# Patient Record
Sex: Male | Born: 1950 | Race: White | Hispanic: No | Smoking: Never smoker
Health system: Southern US, Community
[De-identification: ages and names within clinical notes are randomized; demographics above are authoritative.]

## PROBLEM LIST (undated history)

## (undated) DIAGNOSIS — G14 Postpolio syndrome: Secondary | ICD-10-CM

## (undated) DIAGNOSIS — I739 Peripheral vascular disease, unspecified: Secondary | ICD-10-CM

## (undated) DIAGNOSIS — E119 Type 2 diabetes mellitus without complications: Secondary | ICD-10-CM

## (undated) DIAGNOSIS — Z87442 Personal history of urinary calculi: Secondary | ICD-10-CM

## (undated) DIAGNOSIS — J40 Bronchitis, not specified as acute or chronic: Secondary | ICD-10-CM

## (undated) DIAGNOSIS — H35039 Hypertensive retinopathy, unspecified eye: Secondary | ICD-10-CM

## (undated) DIAGNOSIS — Z9889 Other specified postprocedural states: Secondary | ICD-10-CM

## (undated) DIAGNOSIS — S060XAA Concussion with loss of consciousness status unknown, initial encounter: Secondary | ICD-10-CM

## (undated) DIAGNOSIS — I10 Essential (primary) hypertension: Secondary | ICD-10-CM

## (undated) DIAGNOSIS — K219 Gastro-esophageal reflux disease without esophagitis: Secondary | ICD-10-CM

## (undated) DIAGNOSIS — M1990 Unspecified osteoarthritis, unspecified site: Secondary | ICD-10-CM

## (undated) DIAGNOSIS — Q780 Osteogenesis imperfecta: Secondary | ICD-10-CM

## (undated) DIAGNOSIS — S060X9A Concussion with loss of consciousness of unspecified duration, initial encounter: Secondary | ICD-10-CM

## (undated) DIAGNOSIS — H269 Unspecified cataract: Secondary | ICD-10-CM

## (undated) DIAGNOSIS — R112 Nausea with vomiting, unspecified: Secondary | ICD-10-CM

## (undated) HISTORY — PX: ANKLE FRACTURE SURGERY: SHX122

## (undated) HISTORY — DX: Peripheral vascular disease, unspecified: I73.9

## (undated) HISTORY — PX: LEG SURGERY: SHX1003

## (undated) HISTORY — DX: Unspecified cataract: H26.9

## (undated) HISTORY — PX: OTHER SURGICAL HISTORY: SHX169

## (undated) HISTORY — DX: Hypertensive retinopathy, unspecified eye: H35.039

## (undated) HISTORY — PX: TONSILLECTOMY: SUR1361

## (undated) HISTORY — PX: ELBOW FRACTURE SURGERY: SHX616

## (undated) HISTORY — PX: COLONOSCOPY: SHX174

## (undated) HISTORY — PX: EYE MUSCLE SURGERY: SHX370

---

## 1999-12-16 ENCOUNTER — Emergency Department (HOSPITAL_COMMUNITY): Admission: EM | Admit: 1999-12-16 | Discharge: 1999-12-16 | Payer: Self-pay | Admitting: Emergency Medicine

## 1999-12-16 ENCOUNTER — Encounter: Payer: Self-pay | Admitting: Emergency Medicine

## 2003-09-13 ENCOUNTER — Encounter: Payer: Self-pay | Admitting: Emergency Medicine

## 2003-09-13 ENCOUNTER — Emergency Department (HOSPITAL_COMMUNITY): Admission: EM | Admit: 2003-09-13 | Discharge: 2003-09-14 | Payer: Self-pay | Admitting: Emergency Medicine

## 2003-09-14 ENCOUNTER — Encounter: Payer: Self-pay | Admitting: Emergency Medicine

## 2005-10-19 ENCOUNTER — Inpatient Hospital Stay (HOSPITAL_COMMUNITY): Admission: EM | Admit: 2005-10-19 | Discharge: 2005-10-29 | Payer: Self-pay | Admitting: Emergency Medicine

## 2005-10-20 ENCOUNTER — Ambulatory Visit: Payer: Self-pay | Admitting: Physical Medicine & Rehabilitation

## 2005-10-23 ENCOUNTER — Encounter: Payer: Self-pay | Admitting: Orthopedic Surgery

## 2005-10-29 ENCOUNTER — Inpatient Hospital Stay
Admission: RE | Admit: 2005-10-29 | Discharge: 2005-11-09 | Payer: Self-pay | Admitting: Physical Medicine & Rehabilitation

## 2005-10-29 ENCOUNTER — Encounter (HOSPITAL_COMMUNITY)
Admission: RE | Admit: 2005-10-29 | Discharge: 2005-11-09 | Payer: Self-pay | Admitting: Physical Medicine & Rehabilitation

## 2005-12-14 ENCOUNTER — Ambulatory Visit (HOSPITAL_COMMUNITY): Admission: RE | Admit: 2005-12-14 | Discharge: 2005-12-14 | Payer: Self-pay | Admitting: Orthopedic Surgery

## 2005-12-14 ENCOUNTER — Ambulatory Visit (HOSPITAL_BASED_OUTPATIENT_CLINIC_OR_DEPARTMENT_OTHER): Admission: RE | Admit: 2005-12-14 | Discharge: 2005-12-14 | Payer: Self-pay | Admitting: Orthopedic Surgery

## 2012-09-03 ENCOUNTER — Ambulatory Visit (INDEPENDENT_AMBULATORY_CARE_PROVIDER_SITE_OTHER): Payer: Medicare Other

## 2012-09-03 DIAGNOSIS — Z23 Encounter for immunization: Secondary | ICD-10-CM

## 2016-01-29 ENCOUNTER — Emergency Department (HOSPITAL_COMMUNITY): Payer: Medicare Other

## 2016-01-29 ENCOUNTER — Encounter (HOSPITAL_COMMUNITY): Payer: Self-pay | Admitting: Emergency Medicine

## 2016-01-29 ENCOUNTER — Emergency Department (HOSPITAL_COMMUNITY)
Admission: EM | Admit: 2016-01-29 | Discharge: 2016-01-29 | Disposition: A | Discharge status: Home / Self Care | Payer: Medicare Other | Attending: Emergency Medicine | Admitting: Emergency Medicine

## 2016-01-29 DIAGNOSIS — Z8776 Personal history of (corrected) congenital malformations of integument, limbs and musculoskeletal system: Secondary | ICD-10-CM | POA: Insufficient documentation

## 2016-01-29 DIAGNOSIS — M545 Low back pain: Secondary | ICD-10-CM | POA: Diagnosis present

## 2016-01-29 DIAGNOSIS — I10 Essential (primary) hypertension: Secondary | ICD-10-CM | POA: Diagnosis not present

## 2016-01-29 DIAGNOSIS — Z8709 Personal history of other diseases of the respiratory system: Secondary | ICD-10-CM | POA: Diagnosis not present

## 2016-01-29 DIAGNOSIS — E119 Type 2 diabetes mellitus without complications: Secondary | ICD-10-CM | POA: Diagnosis not present

## 2016-01-29 DIAGNOSIS — M5431 Sciatica, right side: Secondary | ICD-10-CM | POA: Insufficient documentation

## 2016-01-29 DIAGNOSIS — G8929 Other chronic pain: Secondary | ICD-10-CM | POA: Insufficient documentation

## 2016-01-29 HISTORY — DX: Osteogenesis imperfecta: Q78.0

## 2016-01-29 HISTORY — DX: Type 2 diabetes mellitus without complications: E11.9

## 2016-01-29 HISTORY — DX: Postpolio syndrome: G14

## 2016-01-29 HISTORY — DX: Bronchitis, not specified as acute or chronic: J40

## 2016-01-29 HISTORY — DX: Essential (primary) hypertension: I10

## 2016-01-29 LAB — URINE MICROSCOPIC-ADD ON: RBC / HPF: NONE SEEN RBC/hpf (ref 0–5)

## 2016-01-29 LAB — URINALYSIS, ROUTINE W REFLEX MICROSCOPIC
BILIRUBIN URINE: NEGATIVE
Glucose, UA: >1000 mg/dL — AB
HGB URINE DIPSTICK: NEGATIVE
Leukocytes, UA: NEGATIVE
Nitrite: NEGATIVE
PH: 5.5 (ref 5.0–8.0)
Protein, ur: NEGATIVE mg/dL
Specific Gravity, Urine: 1.015 (ref 1.005–1.030)

## 2016-01-29 MED ORDER — HYDROCODONE-ACETAMINOPHEN 5-325 MG PO TABS
1.0000 | ORAL_TABLET | Freq: Once | ORAL | Status: AC
  Administered 2016-01-29: 1 via ORAL
  Filled 2016-01-29: qty 1

## 2016-01-29 MED ORDER — METHOCARBAMOL 500 MG PO TABS: 500.0000 mg | ORAL_TABLET | Freq: Three times a day (TID) | ORAL | Status: DC

## 2016-01-29 MED ORDER — METHOCARBAMOL 500 MG PO TABS
500.0000 mg | ORAL_TABLET | Freq: Once | ORAL | Status: AC
  Administered 2016-01-29: 500 mg via ORAL
  Filled 2016-01-29: qty 1

## 2016-01-29 MED ORDER — HYDROCODONE-ACETAMINOPHEN 7.5-325 MG PO TABS: 1.0000 | ORAL_TABLET | Freq: Four times a day (QID) | ORAL | Status: DC | PRN

## 2016-01-29 NOTE — ED Provider Notes (Signed)
CSN: [id number]     Arrival date & time 01/29/16  1234 History  By signing my name below, I, Ronney Lion, attest that this documentation has been prepared under the direction and in the presence of Tamme Mozingo, PA-C. Electronically Signed: Ronney Lion, ED Scribe. 01/29/2016. 3:42 PM.    Chief Complaint  Patient presents with  . Back Pain   Patient is a 65 y.o. male presenting with back pain. The history is provided by the patient. No language interpreter was used.  Back Pain Location:  Sacro-iliac joint Quality:  Stabbing Radiates to:  R posterior upper leg and R thigh Pain severity:  Severe Duration:  10 days Timing:  Constant Chronicity:  Chronic Context: not falling, not occupational injury, not pedestrian accident and not recent injury   Relieved by:  None tried Worsened by:  Nothing tried Ineffective treatments: Tylenol. Associated symptoms: no abdominal pain, no bladder incontinence, no bowel incontinence, no chest pain, no dysuria, no fever, no numbness, no tingling and no weakness   Risk factors comment:  History of osteogenesis imperfecta   HPI Comments: BROGAN MARTIS is a 65 y.o. male with a history of post-polio syndrome, osteogenesis imperfecta, HTN, DM, and bronchitis, who presents to the Emergency Department complaining of an acute exacerbation of right lower back pain radiating into his right hip, with onset 10 days ago. Patient reports he has this same pain intermittently all the time, but this usually subsides in a day or two. However, patient had taken Tylenol, which normally alleviates his pain, this morning at 3 AM with no relief. He denies any known falls or injuries, or any other possible known triggers that may have caused any fractures. Patient denies any chest pain, dysuria, bowel or bladder incontinence, or lower extremity numbness weakness or tingling. Uses a cane at baseline  Past Medical History  Diagnosis Date  . Hypertension   . Diabetes mellitus  without complication (HCC)   . Post-polio syndrome   . Osteogenesis imperfecta   . Bronchitis    Past Surgical History  Procedure Laterality Date  . Arm surgery    . Ankle fracture surgery Bilateral   . Eye muscle surgery    . Leg surgery Left    Family History  Problem Relation Age of Onset  . Hypertension Mother   . Diabetes Brother    Social History  Substance Use Topics  . Smoking status: Never Smoker   . Smokeless tobacco: Never Used  . Alcohol Use: No    Review of Systems  Constitutional: Negative for fever.  Respiratory: Negative for shortness of breath.   Cardiovascular: Negative for chest pain.  Gastrointestinal: Negative for vomiting, abdominal pain, constipation and bowel incontinence.  Genitourinary: Negative for bladder incontinence, dysuria, hematuria, flank pain, decreased urine volume and difficulty urinating.       Negative for incontinence  Musculoskeletal: Positive for back pain. Negative for joint swelling.  Skin: Negative for rash.  Neurological: Negative for tingling, weakness and numbness.  All other systems reviewed and are negative.     Allergies  Augmentin; Iodine; and Tape  Home Medications   Prior to Admission medications   Not on File   BP 129/77 mmHg  Pulse 89  Temp(Src) 98.1 F (36.7 C) (Oral)  Resp 20  SpO2 98% Physical Exam  Constitutional: He is oriented to person, place, and time. He appears well-developed and well-nourished. No distress.  HENT:  Head: Normocephalic and atraumatic.  Eyes: Conjunctivae and EOM are normal.  Blue  tinge to sclera.  Neck: Neck supple. No tracheal deviation present.  Cardiovascular: Normal rate, regular rhythm and normal heart sounds.  Exam reveals no gallop and no friction rub.   No murmur heard. Pulmonary/Chest: Effort normal. No respiratory distress.  Musculoskeletal: He exhibits tenderness. He exhibits no edema.  Tenderness at right SI joint. No spinal tenderness.  Neg SLR bilaterally.   Sensation intact  Neurological: He is alert and oriented to person, place, and time.  Skin: Skin is warm and dry.  Psychiatric: He has a normal mood and affect. His behavior is normal.  Nursing note and vitals reviewed.   ED Course  Procedures (including critical care time)  DIAGNOSTIC STUDIES: Oxygen Saturation is 98% on RA, normal by my interpretation.    COORDINATION OF CARE: 2:20 PM - Discussed treatment plan with pt at bedside which includes sacral XR and dosage of muscle relaxant administered here. Pt verbalized understanding and agreed to plan.   Labs Review Labs Reviewed  URINALYSIS, ROUTINE W REFLEX MICROSCOPIC (NOT AT [facility]) - Abnormal; Notable for the following:    Glucose, UA >1000 (*)    Ketones, ur TRACE (*)    All other components within normal limits  URINE MICROSCOPIC-ADD ON - Abnormal; Notable for the following:    Squamous Epithelial / LPF 0-5 (*)    Bacteria, UA RARE (*)    All other components within normal limits    Imaging Review Dg Lumbar Spine Complete  01/29/2016  CLINICAL DATA:  65 year old presenting with a 10 day history of low back pain radiating into the right hip associated with muscle spasm. Prior thoracolumbar compression fractures. Current history of post-polio syndrome and osteogenesis imperfecta. EXAM: LUMBAR SPINE - COMPLETE 4+ VIEW COMPARISON:  MRI lumbar spine 05/07/2008. FINDINGS: Five non rib-bearing lumbar vertebrae with anatomic posterior alignment. Old compression fractures of T12 and L1 as noted on the prior MRI. No evidence of acute fracture. 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 DISH throughout the visualized lower thoracic spine. Disc space narrowing at every lumbar level. No pars defects. Facet degenerative changes at L4-5 and L5-S1. Sacroiliac joints intact with degenerative changes. Symphysis pubis intact. Intramedullary nail in the visualized left femur.  Moderate symmetric axial joint space narrowing in the hips. IMPRESSION: 1. No acute osseous abnormality. 2. Multilevel degenerative disc disease, spondylosis and facet degenerative changes. 3. DISH. Electronically Signed   By: Hulan Saas M.D.   On: 01/29/2016 15:30   Dg Hip Unilat With Pelvis 2-3 Views Right  01/29/2016  CLINICAL DATA:  Muscle spasms in the back and right hip for 10 days. Pain. EXAM: DG HIP (WITH OR WITHOUT PELVIS) 2-3V RIGHT COMPARISON:  None. FINDINGS: The bones appear mildly demineralized. There is a left femoral intramedullary nail with associated posttraumatic deformity of the proximal left femoral diaphysis. There is heterotopic ossification superior to the left greater trochanter and adjacent to the left iliac bone. The proximal right femur appears normal. There is no evidence of acute fracture, dislocation or femoral head avascular necrosis. Mild degenerative changes are present at the sacroiliac joints. IMPRESSION: Negative right hip radiographs. Previous proximal left femoral intramedullary nail placement. Electronically Signed   By: Carey Bullocks M.D.   On: 01/29/2016 15:26   I have personally reviewed and evaluated these images and lab results as part of my medical decision-making.   EKG Interpretation None      MDM   Final diagnoses:  Sciatica of  right side   Pt is well appearing.  Ambulates with a cane at baseline.  No focal neuro deficits.  Hx of osteogenesis imperfecta, XRs neg for fx.  No concerning sx's for emergent neurological process.   I personally performed the services described in this documentation, which was scribed in my presence. The recorded information has been reviewed and is accurate.     Pauline Aus, PA-C 01/30/16 2132  Margarita Grizzle, MD 01/31/16 [phone]

## 2016-01-29 NOTE — Discharge Instructions (Signed)
°  Sciatica °Sciatica is pain, weakness, numbness, or tingling along your sciatic nerve. The nerve starts in the lower back and runs down the back of each leg. Nerve damage or certain conditions pinch or put pressure on the sciatic nerve. This causes the pain, weakness, and other discomforts of sciatica. °HOME CARE  °· Only take medicine as told by your doctor. °· Apply ice to the affected area for 20 minutes. Do this 3-4 times a day for the first 48-72 hours. Then try heat in the same way. °· Exercise, stretch, or do your usual activities if these do not make your pain worse. °· Go to physical therapy as told by your doctor. °· Keep all doctor visits as told. °· Do not wear high heels or shoes that are not supportive. °· Get a firm mattress if your mattress is too soft to lessen pain and discomfort. °GET HELP RIGHT AWAY IF:  °· You cannot control when you poop (bowel movement) or pee (urinate). °· You have more weakness in your lower back, lower belly (pelvis), butt (buttocks), or legs. °· You have redness or puffiness (swelling) of your back. °· You have a burning feeling when you pee. °· You have pain that gets worse when you lie down. °· You have pain that wakes you from your sleep. °· Your pain is worse than past pain. °· Your pain lasts longer than 4 weeks. °· You are suddenly losing weight without reason. °MAKE SURE YOU:  °· Understand these instructions. °· Will watch this condition. °· Will get help right away if you are not doing well or get worse. °  °This information is not intended to replace advice given to you by your health care provider. Make sure you discuss any questions you have with your health care provider. °  °Document Released: 09/11/2008 Document Revised: 08/24/2015 Document Reviewed: 04/13/2012 °Elsevier Interactive Patient Education ©2016 [employer] ° ° °

## 2016-01-29 NOTE — ED Notes (Signed)
Patient c/o right lower back pain that radiates into right hip x10 days. Per patient has pain intermittently but usually subsides within a day or two. Patient reports taking tylenol this morning at 3 with no relief.

## 2018-11-27 ENCOUNTER — Other Ambulatory Visit (INDEPENDENT_AMBULATORY_CARE_PROVIDER_SITE_OTHER): Payer: Self-pay | Admitting: Nurse Practitioner

## 2018-12-25 ENCOUNTER — Ambulatory Visit (INDEPENDENT_AMBULATORY_CARE_PROVIDER_SITE_OTHER): Payer: Medicare Other

## 2018-12-25 ENCOUNTER — Encounter: Payer: Self-pay | Admitting: Orthopaedic Surgery

## 2018-12-25 ENCOUNTER — Ambulatory Visit: Payer: Medicare Other | Admitting: Orthopaedic Surgery

## 2018-12-25 VITALS — BP 149/83 | HR 89 | Ht 65.0 in | Wt 206.0 lb

## 2018-12-25 DIAGNOSIS — E119 Type 2 diabetes mellitus without complications: Secondary | ICD-10-CM

## 2018-12-25 DIAGNOSIS — M25511 Pain in right shoulder: Secondary | ICD-10-CM | POA: Diagnosis not present

## 2018-12-25 DIAGNOSIS — G14 Postpolio syndrome: Secondary | ICD-10-CM | POA: Diagnosis not present

## 2018-12-25 DIAGNOSIS — Q780 Osteogenesis imperfecta: Secondary | ICD-10-CM | POA: Diagnosis not present

## 2018-12-25 DIAGNOSIS — E1165 Type 2 diabetes mellitus with hyperglycemia: Secondary | ICD-10-CM | POA: Insufficient documentation

## 2018-12-25 HISTORY — DX: Type 2 diabetes mellitus without complications: E11.9

## 2018-12-25 NOTE — Progress Notes (Signed)
Subjective:    Patient ID: Kurt [name], male    DOB: 1951-01-14, 68 y.o.   MRN: [id number]  HPI He has had right shoulder pain for years.  He contacted polio at age 68 and it affected his right upper extremity and right lower leg.  He has had tendon surgery on the right for his hand but still has some deformity and laxity of the fingers.  He hurt his right shoulder years ago and had a fracture that was not repaired.  He has chronic pain in the right shoulder.  He is on pain medicine.  He has no new trauma.  He has some limitation of motion of the right shoulder.  He is tired of the shoulder hurting.  He asks about possible injection or other procedure.  He has no redness, no paresthesias and no effusion of the shoulder.  He uses a cane normally.  He also has diabetes, COPD, and osteogenesis imperfecta.   Review of Systems  Constitutional: Positive for activity change.  Respiratory: Positive for shortness of breath. Negative for cough.   Musculoskeletal: Positive for arthralgias, gait problem and joint swelling.  All other systems reviewed and are negative.  For Review of Systems, all other systems reviewed and are negative.  The following is a summary of the past history medically, past history surgically, known current medicines, social history and family history.  This information is gathered electronically by the computer from prior information and documentation.  I review this each visit and have found including this information at this point in the chart is beneficial and informative.   Past Medical History:  Diagnosis Date  . Bronchitis   . Diabetes mellitus without complication (HCC)   . Hypertension   . Osteogenesis imperfecta   . Post-polio syndrome     Past Surgical History:  Procedure Laterality Date  . ANKLE FRACTURE SURGERY Bilateral   . arm surgery    . EYE MUSCLE SURGERY    . LEG SURGERY Left     Current Outpatient Medications on File Prior to Visit    Medication Sig Dispense Refill  . acetaminophen (TYLENOL) 500 MG tablet Take 1,000 mg by mouth every 4 (four) hours as needed for mild pain or moderate pain.    Marland Kitchen. glipiZIDE (GLUCOTROL XL) 10 MG 24 hr tablet Take 10 mg by mouth daily with breakfast.    . HYDROcodone-acetaminophen (NORCO) 7.5-325 MG tablet Take 1 tablet by mouth every 6 (six) hours as needed for moderate pain. 20 tablet 0  . lisinopril (PRINIVIL,ZESTRIL) 20 MG tablet Take 20 mg by mouth daily.    . metFORMIN (GLUCOPHAGE) 1000 MG tablet Take 1,000 mg by mouth 2 (two) times daily with a meal.    . methocarbamol (ROBAXIN) 500 MG tablet Take 1 tablet (500 mg total) by mouth 3 (three) times daily. 21 tablet 0  . simvastatin (ZOCOR) 20 MG tablet Take 20 mg by mouth every evening.     No current facility-administered medications on file prior to visit.     Social History   Socioeconomic History  . Marital status: Married    Spouse name: Not on file  . Number of children: Not on file  . Years of education: Not on file  . Highest education level: Not on file  Occupational History  . Not on file  Social Needs  . Financial resource strain: Not on file  . Food insecurity:    Worry: Not on file    Inability: Not  on file  . Transportation needs:    Medical: Not on file    Non-medical: Not on file  Tobacco Use  . Smoking status: Never Smoker  . Smokeless tobacco: Never Used  Substance and Sexual Activity  . Alcohol use: No  . Drug use: No  . Sexual activity: Not on file  Lifestyle  . Physical activity:    Days per week: Not on file    Minutes per session: Not on file  . Stress: Not on file  Relationships  . Social connections:    Talks on phone: Not on file    Gets together: Not on file    Attends religious service: Not on file    Active member of club or organization: Not on file    Attends meetings of clubs or organizations: Not on file    Relationship status: Not on file  . Intimate partner violence:    Fear of  current or ex partner: Not on file    Emotionally abused: Not on file    Physically abused: Not on file    Forced sexual activity: Not on file  Other Topics Concern  . Not on file  Social History Narrative  . Not on file    Family History  Problem Relation Age of Onset  . Hypertension Mother   . Diabetes Brother     BP (!) 149/83   Pulse 89   Ht 5\' 5"  (1.651 m)   Wt 206 lb (93.4 kg)   BMI 34.28 kg/m   Body mass index is 34.28 kg/m.      Objective:   Physical Exam Constitutional:      Appearance: He is well-developed.  HENT:     Head: Normocephalic and atraumatic.  Eyes:     Conjunctiva/sclera: Conjunctivae normal.     Pupils: Pupils are equal, round, and reactive to light.  Neck:     Musculoskeletal: Normal range of motion and neck supple.  Cardiovascular:     Rate and Rhythm: Normal rate and regular rhythm.  Pulmonary:     Effort: Pulmonary effort is normal.  Abdominal:     Palpations: Abdomen is soft.  Musculoskeletal:     Right shoulder: He exhibits decreased range of motion and tenderness.       Arms:  Skin:    General: Skin is warm and dry.  Neurological:     Mental Status: He is alert and oriented to person, place, and time.     Cranial Nerves: No cranial nerve deficit.     Motor: No abnormal muscle tone.     Coordination: Coordination normal.     Deep Tendon Reflexes: Reflexes are normal and symmetric. Reflexes normal.  Psychiatric:        Behavior: Behavior normal.        Thought Content: Thought content normal.        Judgment: Judgment normal.   He uses a cane to walk and has weakness on the right lower leg and limp to the right.  He has a waddleing type gait.   X-rays were done of the right shoulder, reported separately.  I have reviewed notes from his family doctor.    Assessment & Plan:   Encounter Diagnoses  Name Primary?  . Pain in joint of right shoulder Yes  . Post-polio syndrome   . Osteogenesis imperfecta   . Diabetes  mellitus without complication (HCC)    PROCEDURE NOTE:  The patient request injection, verbal consent was obtained.  The right shoulder was prepped appropriately after time out was performed.   Sterile technique was observed and injection of 1 cc of Depo-Medrol 40 mg with several cc's of plain xylocaine. Anesthesia was provided by ethyl chloride and a 20-gauge needle was used to inject the shoulder area. A posterior approach was used.  The injection was tolerated well.  A band aid dressing was applied.  The patient was advised to apply ice later today and tomorrow to the injection sight as needed.  I have told him he has degenerative changes of the right shoulder.  I would not recommend any surgery at this point and time.  I will see him in one month.  Call if any problem.  Precautions discussed.   Electronically Signed Darreld McleanWayne Latressa Harries, MD 1/9/20208:50 AM

## 2019-01-22 ENCOUNTER — Ambulatory Visit: Payer: Medicare Other | Admitting: Orthopaedic Surgery

## 2019-02-04 ENCOUNTER — Ambulatory Visit: Payer: Medicare Other | Admitting: Orthopaedic Surgery

## 2019-02-04 ENCOUNTER — Encounter: Payer: Self-pay | Admitting: Orthopaedic Surgery

## 2019-02-04 VITALS — BP 131/76 | HR 91 | Ht 65.0 in | Wt 210.0 lb

## 2019-02-04 DIAGNOSIS — M25511 Pain in right shoulder: Secondary | ICD-10-CM

## 2019-02-04 NOTE — Progress Notes (Signed)
CC:  My shoulder is much better  He is doing well with the right shoulder post injection last time.  He has painless motion.  He still lacks full overhead flexion by about 20 degrees.  NV intact.  Encounter Diagnosis  Name Primary?  . Pain in joint of right shoulder Yes   I will see as needed.  Call if any problem.  Precautions discussed.   Electronically Signed Darreld Mclean, MD 2/19/20202:03 PM

## 2019-02-24 ENCOUNTER — Other Ambulatory Visit: Payer: Self-pay

## 2019-02-24 ENCOUNTER — Encounter (HOSPITAL_COMMUNITY): Payer: Self-pay | Admitting: *Deleted

## 2019-02-24 ENCOUNTER — Inpatient Hospital Stay (HOSPITAL_COMMUNITY)
Admission: EM | Admit: 2019-02-24 | Discharge: 2019-02-27 | DRG: 603 | Disposition: A | Discharge status: Home Health | Payer: Medicare Other | Source: Ambulatory Visit | Attending: Internal Medicine | Admitting: Internal Medicine

## 2019-02-24 DIAGNOSIS — Z881 Allergy status to other antibiotic agents status: Secondary | ICD-10-CM

## 2019-02-24 DIAGNOSIS — Z833 Family history of diabetes mellitus: Secondary | ICD-10-CM

## 2019-02-24 DIAGNOSIS — I10 Essential (primary) hypertension: Secondary | ICD-10-CM | POA: Diagnosis present

## 2019-02-24 DIAGNOSIS — Q780 Osteogenesis imperfecta: Secondary | ICD-10-CM

## 2019-02-24 DIAGNOSIS — G14 Postpolio syndrome: Secondary | ICD-10-CM | POA: Diagnosis present

## 2019-02-24 DIAGNOSIS — E1165 Type 2 diabetes mellitus with hyperglycemia: Secondary | ICD-10-CM | POA: Diagnosis present

## 2019-02-24 DIAGNOSIS — Z8249 Family history of ischemic heart disease and other diseases of the circulatory system: Secondary | ICD-10-CM | POA: Diagnosis not present

## 2019-02-24 DIAGNOSIS — L03116 Cellulitis of left lower limb: Secondary | ICD-10-CM | POA: Diagnosis present

## 2019-02-24 DIAGNOSIS — Z888 Allergy status to other drugs, medicaments and biological substances status: Secondary | ICD-10-CM | POA: Diagnosis not present

## 2019-02-24 DIAGNOSIS — L0390 Cellulitis, unspecified: Secondary | ICD-10-CM | POA: Diagnosis present

## 2019-02-24 LAB — CBC WITH DIFFERENTIAL/PLATELET
ABS IMMATURE GRANULOCYTES: 0.06 10*3/uL (ref 0.00–0.07)
BASOS ABS: 0.1 10*3/uL (ref 0.0–0.1)
Basophils Relative: 1 %
EOS PCT: 1 %
Eosinophils Absolute: 0.1 10*3/uL (ref 0.0–0.5)
HCT: 42.6 % (ref 39.0–52.0)
HEMOGLOBIN: 13.5 g/dL (ref 13.0–17.0)
IMMATURE GRANULOCYTES: 1 %
LYMPHS ABS: 2.3 10*3/uL (ref 0.7–4.0)
LYMPHS PCT: 22 %
MCH: 28.8 pg (ref 26.0–34.0)
MCHC: 31.7 g/dL (ref 30.0–36.0)
MCV: 91 fL (ref 80.0–100.0)
Monocytes Absolute: 0.9 10*3/uL (ref 0.1–1.0)
Monocytes Relative: 8 %
NEUTROS ABS: 7.2 10*3/uL (ref 1.7–7.7)
NEUTROS PCT: 67 %
NRBC: 0 % (ref 0.0–0.2)
Platelets: 312 10*3/uL (ref 150–400)
RBC: 4.68 MIL/uL (ref 4.22–5.81)
RDW: 14.4 % (ref 11.5–15.5)
WBC: 10.6 10*3/uL — ABNORMAL HIGH (ref 4.0–10.5)

## 2019-02-24 LAB — COMPREHENSIVE METABOLIC PANEL
ALT: 22 U/L (ref 0–44)
ANION GAP: 8 (ref 5–15)
AST: 19 U/L (ref 15–41)
Albumin: 3.7 g/dL (ref 3.5–5.0)
Alkaline Phosphatase: 87 U/L (ref 38–126)
BUN: 14 mg/dL (ref 8–23)
CHLORIDE: 100 mmol/L (ref 98–111)
CO2: 25 mmol/L (ref 22–32)
Calcium: 9 mg/dL (ref 8.9–10.3)
Creatinine, Ser: 1.03 mg/dL (ref 0.61–1.24)
Glucose, Bld: 341 mg/dL — ABNORMAL HIGH (ref 70–99)
POTASSIUM: 4.9 mmol/L (ref 3.5–5.1)
SODIUM: 133 mmol/L — AB (ref 135–145)
Total Bilirubin: 0.7 mg/dL (ref 0.3–1.2)
Total Protein: 6.7 g/dL (ref 6.5–8.1)

## 2019-02-24 LAB — LACTIC ACID, PLASMA: LACTIC ACID, VENOUS: 2.1 mmol/L — AB (ref 0.5–1.9)

## 2019-02-24 LAB — CBG MONITORING, ED: GLUCOSE-CAPILLARY: 335 mg/dL — AB (ref 70–99)

## 2019-02-24 MED ORDER — VANCOMYCIN HCL IN DEXTROSE 1-5 GM/200ML-% IV SOLN
1000.0000 mg | INTRAVENOUS | Status: AC
  Administered 2019-02-24 (×2): 1000 mg via INTRAVENOUS
  Filled 2019-02-24 (×2): qty 200

## 2019-02-24 MED ORDER — VANCOMYCIN HCL IN DEXTROSE 1-5 GM/200ML-% IV SOLN
1000.0000 mg | Freq: Two times a day (BID) | INTRAVENOUS | Status: DC
  Administered 2019-02-25 – 2019-02-27 (×5): 1000 mg via INTRAVENOUS
  Filled 2019-02-24 (×5): qty 200

## 2019-02-24 MED ORDER — VANCOMYCIN HCL IN DEXTROSE 1-5 GM/200ML-% IV SOLN: 1000.0000 mg | Freq: Once | INTRAVENOUS | Status: DC

## 2019-02-24 MED ORDER — SODIUM CHLORIDE 0.9 % IV SOLN
2.0000 g | Freq: Once | INTRAVENOUS | Status: AC
  Administered 2019-02-24: 2 g via INTRAVENOUS
  Filled 2019-02-24: qty 20

## 2019-02-24 NOTE — ED Provider Notes (Signed)
Emergency Department Provider Note   I have reviewed the triage vital signs and the nursing notes.   HISTORY  Chief Complaint Leg Injury (left)   HPI Kurt [name] is a 68 y.o. male with PMH of DM, HTN, and osteogenesis imperfecta presents to the emergency department for evaluation of left leg injury with concern for infection.  Patient coming from his PCPs office at [facility] medical.  4 days ago the patient fell with his left leg through a floor air vent.  His entire leg went into the duct up to the upper thigh.  He sustained lacerations and abrasions to the left lower leg primarily with bruising in the upper thigh.  He was started on clindamycin and has taken 3.5 days of medication.  He notices worsening redness and drainage from the wounds.  He saw his PCP today who evaluated him and sent him to the emergency department for evaluation of possible infection.  He has not noticed any fever at home.  No shaking chills.  No radiation of symptoms or modifying factors.    Past Medical History:  Diagnosis Date  . Bronchitis   . Diabetes mellitus without complication (HCC)   . Hypertension   . Osteogenesis imperfecta   . Post-polio syndrome     Patient Active Problem List   Diagnosis Date Noted  . 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 Osteogenesis imperfecta 12/25/2018  . Diabetes mellitus without complication (HCC) 12/25/2018    Past Surgical History:  Procedure Laterality Date  . ANKLE FRACTURE SURGERY Bilateral   . arm surgery    . EYE MUSCLE SURGERY    . LEG SURGERY Left     Allergies Augmentin [amoxicillin-pot clavulanate]; Iodine; and Tape  Family History  Problem Relation Age of Onset  . Hypertension Mother   . Diabetes Brother     Social History Social History   Tobacco Use  . Smoking status: Never Smoker  . Smokeless tobacco: Never Used  Substance Use Topics  . Alcohol use: No  . Drug use: No    Review of Systems  Constitutional: No  fever/chills Eyes: No visual changes. ENT: No sore throat. Cardiovascular: Denies chest pain. Respiratory: Denies shortness of breath. Gastrointestinal: No abdominal pain.  No nausea, no vomiting.  No diarrhea.  No constipation. Genitourinary: Negative for dysuria. Musculoskeletal: Negative for back pain. Positive left leg pain.  Skin: Positive left leg wound with drainage.  Neurological: Negative for headaches, focal weakness or numbness.  10-point ROS otherwise negative.  ____________________________________________   PHYSICAL EXAM:  VITAL SIGNS: ED Triage Vitals  Enc Vitals Group     BP 02/24/19 1735 138/80     Pulse Rate 02/24/19 1735 (!) 105     Resp 02/24/19 1735 18     Temp 02/24/19 1735 98 F (36.7 C)     Temp Source 02/24/19 1735 Oral     SpO2 02/24/19 1735 100 %     Weight 02/24/19 1736 206 lb (93.4 kg)     Height 02/24/19 1736 5\' 5"  (1.651 m)     Pain Score 02/24/19 1736 4   Constitutional: Alert and oriented. Well appearing and in no acute distress. Eyes: Conjunctivae are normal.  Head: Atraumatic. Nose: No congestion/rhinnorhea. Mouth/Throat: Mucous membranes are moist.  Neck: No stridor.  Cardiovascular: Normal rate, regular rhythm. Good peripheral circulation. Grossly normal heart sounds.   Respiratory: Normal respiratory effort.  No retractions. Lungs CTAB. Gastrointestinal: Soft and nontender. No distention.  Musculoskeletal: No lower extremity tenderness nor edema.  No gross deformities of extremities. Neurologic:  Normal speech and language. No gross focal neurologic deficits are appreciated.  Skin:  Skin is warm and dry.  Multiple abrasions to the left lower leg.  The worst is the posterior left leg which has surrounding erythema and warmth.  Some drainage on the bandages which was applied just prior to arrival.  But does have some mild edema diffusely.  No crepitus.  ____________________________________________   LABS (all labs ordered are listed,  but only abnormal results are displayed)  Labs Reviewed  COMPREHENSIVE METABOLIC PANEL - Abnormal; Notable for the following components:      Result Value   Sodium 133 (*)    Glucose, Bld 341 (*)    All other components within normal limits  CBC WITH DIFFERENTIAL/PLATELET - Abnormal; Notable for the following components:   WBC 10.6 (*)    All other components within normal limits  LACTIC ACID, PLASMA - Abnormal; Notable for the following components:   Lactic Acid, Venous 2.1 (*)    All other components within normal limits  CBG MONITORING, ED - Abnormal; Notable for the following components:   Glucose-Capillary 335 (*)    All other components within normal limits  CULTURE, BLOOD (ROUTINE X 2)  CULTURE, BLOOD (ROUTINE X 2)   ____________________________________________  RADIOLOGY  None  ____________________________________________   PROCEDURES  Procedure(s) performed:   Procedures  None  ____________________________________________   INITIAL IMPRESSION / ASSESSMENT AND PLAN / ED COURSE  Pertinent labs & imaging results that were available during my care of the patient were reviewed by me and considered in my medical decision making (see chart for details).  Patient presents to the emergency department with wounds to the left lower extremity.  He has been on 3.5 days of clindamycin but has developed worsening redness and drainage from the wounds.  He has diabetes which complicates his presentation.  Afebrile here but does have some mild tachycardia.  No wounds extending deep or concern for osteomyelitis.  Patient with leukocytosis and mildly elevated lactate. Plan for abx with concern that cellulitis is developing. Patient with increased risk with IDDM and has been on 3.5 days of Clindamycin with worsening wound status.   Discussed patient's case with Hospitalist, Dr. Sharl Ma to request admission. Patient and family (if present) updated with plan. Care transferred to  Hospitalist service.  I reviewed all nursing notes, vitals, pertinent old records, EKGs, labs, imaging (as available).  ____________________________________________  FINAL CLINICAL IMPRESSION(S) / ED DIAGNOSES  Final diagnoses:  Cellulitis of left lower extremity     MEDICATIONS GIVEN DURING THIS VISIT:  Medications  cefTRIAXone (ROCEPHIN) 2 g in sodium chloride 0.9 % 100 mL IVPB (2 g Intravenous New Bag/Given 02/24/19 2123)  vancomycin (VANCOCIN) IVPB 1000 mg/200 mL premix (has no administration in time range)  vancomycin (VANCOCIN) IVPB 1000 mg/200 mL premix (has no administration in time range)    Note:  This document was prepared using Dragon voice recognition software and may include unintentional dictation errors.  Alona Bene, MD Emergency Medicine    Long, Arlyss Repress, MD 02/24/19 2142

## 2019-02-24 NOTE — H&P (Signed)
TRH H&P    Patient Demographics:    Kurt [name], is a 68 y.o. male  MRN: [id number]  DOB - 07/23/1951  Admit Date - 02/24/2019  Referring MD/NP/PA: Dr. Jacqulyn BathLong  Outpatient Primary MD for the patient is Altamease OilerHarris, Meredith L, FNP  Patient coming from: Home  Chief complaint-left leg wound   HPI:    Kurt [name]  is a 68 y.o. male, with history of diabetes mellitus type 2, hypertension, osteogenesis imperfecta, post polio syndrome came to ED for evaluation of left leg injury.  4 days ago patient fell with his left leg went through floor air vent.  His entire leg went into duct up to the upper thigh.  He sustained lacerations and abrasions to left lower leg with bruising in upper thigh.  Patient was seen at PCP office and was started on clindamycin.  Patient noticed that his left leg acutely worsened with more redness and pain.  So he came to ED for reevaluation. Patient was started on vancomycin and ceftriaxone. He denies fever or chills. Denies nausea vomiting or diarrhea. Denies chest pain or shortness of breath.     Review of systems:    In addition to the HPI above,    All other systems reviewed and are negative.    Past History of the following :    Past Medical History:  Diagnosis Date  . Bronchitis   . Diabetes mellitus without complication (HCC)   . Hypertension   . Osteogenesis imperfecta   . Post-polio syndrome       Past Surgical History:  Procedure Laterality Date  . ANKLE FRACTURE SURGERY Bilateral   . arm surgery    . EYE MUSCLE SURGERY    . LEG SURGERY Left       Social History:      Social History   Tobacco Use  . Smoking status: Never Smoker  . Smokeless tobacco: Never Used  Substance Use Topics  . Alcohol use: No       Family History :     Family History  Problem Relation Age of Onset  . Hypertension Mother   . Diabetes Brother       Home  Medications:   Prior to Admission medications   Medication Sig Start Date End Date Taking? Authorizing Provider  acetaminophen (TYLENOL) 500 MG tablet Take 1,000 mg by mouth every 4 (four) hours as needed for mild pain or moderate pain.   Yes [provider]  clindamycin (CLEOCIN) 300 MG capsule Take 300 mg by mouth every 6 (six) hours. 10 day course starting on 02/21/2019 02/21/19  Yes [provider]  glipiZIDE (GLUCOTROL XL) 10 MG 24 hr tablet Take 10 mg by mouth at bedtime.    Yes [provider]  HYDROcodone-acetaminophen (NORCO) 7.5-325 MG tablet Take 1 tablet by mouth every 6 (six) hours as needed for moderate pain. 01/29/16  Yes Triplett, Tammy, PA-C  LANTUS SOLOSTAR 100 UNIT/ML Solostar Pen Inject 30 Units into the skin at bedtime.  12/19/18  Yes [provider]  lisinopril (  PRINIVIL,ZESTRIL) 20 MG tablet Take 20 mg by mouth daily.   Yes [provider]  Naphazoline-Pheniramine (ALLERGY EYE OP) Apply 1 drop to eye every morning.   Yes [provider]  simvastatin (ZOCOR) 20 MG tablet Take 20 mg by mouth every evening.   Yes [provider]  SitaGLIPtin-MetFORMIN HCl (JANUMET XR) [phone] MG TB24 Take 1 tablet by mouth at bedtime.   Yes [provider]     Allergies:     Allergies  Allergen Reactions  . Augmentin [Amoxicillin-Pot Clavulanate] Nausea And Vomiting  . Iodine Hives  . Tape Rash    Paper      Physical Exam:   Vitals  Blood pressure 134/88, pulse 83, temperature 98 F (36.7 C), temperature source Oral, resp. rate 18, height 5\' 5"  (1.651 m), weight 93.4 kg, SpO2 100 %.  1.  General: Appears in no acute distress  2. Psychiatric: Alert, oriented x3, intact insight and judgment  3. Neurologic: Cranial nerves II to XII grossly intact, right lower extremity has muscular atrophy, motor strength 5/5 in all extremities  4. HEENMT:  Atraumatic normocephalic, oral mucosa is moist  5. Respiratory  : Clear to auscultation bilaterally, no wheezing or crackles auscultated  6. Cardiovascular : S1-S2, regular, no murmur auscultated  7. Gastrointestinal:  Abdomen is soft, nontender, no organomegaly  8. Skin:  Left lower extremity has erythema, sloughing of skin from posterior aspect of left lower extremity, erythema noted at the left thigh  9.Musculoskeletal:  Right lower extremity has muscle atrophy    Data Review:    CBC Recent Labs  Lab 02/24/19 1954  WBC 10.6*  HGB 13.5  HCT 42.6  PLT 312  MCV 91.0  MCH 28.8  MCHC 31.7  RDW 14.4  LYMPHSABS 2.3  MONOABS 0.9  EOSABS 0.1  BASOSABS 0.1   ------------------------------------------------------------------------------------------------------------------  Results for orders placed or performed during the hospital encounter of 02/24/19 (from the past 48 hour(s))  POC CBG, ED     Status: Abnormal   Collection Time: 02/24/19  5:41 PM  Result Value Ref Range   Glucose-Capillary 335 (H) 70 - 99 mg/dL  Comprehensive metabolic panel     Status: Abnormal   Collection Time: 02/24/19  7:54 PM  Result Value Ref Range   Sodium 133 (L) 135 - 145 mmol/L   Potassium 4.9 3.5 - 5.1 mmol/L   Chloride 100 98 - 111 mmol/L   CO2 25 22 - 32 mmol/L   Glucose, Bld 341 (H) 70 - 99 mg/dL   BUN 14 8 - 23 mg/dL   Creatinine, Ser 3.87 0.61 - 1.24 mg/dL   Calcium 9.0 8.9 - 56.4 mg/dL   Total Protein 6.7 6.5 - 8.1 g/dL   Albumin 3.7 3.5 - 5.0 g/dL   AST 19 15 - 41 U/L   ALT 22 0 - 44 U/L   Alkaline Phosphatase 87 38 - 126 U/L   Total Bilirubin 0.7 0.3 - 1.2 mg/dL   GFR calc non Af Amer >60 >60 mL/min   GFR calc Af Amer >60 >60 mL/min   Anion gap 8 5 - 15    Comment: Performed at [facility], [address] [place], [place] [address]  CBC with Differential     Status: Abnormal   Collection Time: 02/24/19  7:54 PM  Result Value Ref Range   WBC 10.6 (H) 4.0 - 10.5 K/uL   RBC 4.68 4.22 - 5.81 MIL/uL   Hemoglobin 13.5 13.0 - 17.0 g/dL  HCT 42.6 39.0 - 52.0 %   MCV 91.0 80.0 - 100.0 fL   MCH 28.8 26.0 - 34.0 pg   MCHC 31.7 30.0 - 36.0 g/dL   RDW 40.1 02.7 - 25.3 %   Platelets 312 150 - 400 K/uL   nRBC 0.0 0.0 - 0.2 %   Neutrophils Relative % 67 %   Neutro Abs 7.2 1.7 - 7.7 K/uL   Lymphocytes Relative 22 %   Lymphs Abs 2.3 0.7 - 4.0 K/uL   Monocytes Relative 8 %   Monocytes Absolute 0.9 0.1 - 1.0 K/uL   Eosinophils Relative 1 %   Eosinophils Absolute 0.1 0.0 - 0.5 K/uL   Basophils Relative 1 %   Basophils Absolute 0.1 0.0 - 0.1 K/uL   Immature Granulocytes 1 %   Abs Immature Granulocytes 0.06 0.00 - 0.07 K/uL    Comment: Performed at [facility], [address] [place], [place] [address]  Lactic acid, plasma     Status: Abnormal   Collection Time: 02/24/19  7:54 PM  Result Value Ref Range   Lactic Acid, Venous 2.1 (HH) 0.5 - 1.9 mmol/L    Comment: CRITICAL RESULT CALLED TO, READ BACK BY AND VERIFIED WITH: TURNER,C ON 02/24/19 AT 2035 BY LOY,C Performed at [facility], [address] [place], [place] [address]     Chemistries  Recent Labs  Lab 02/24/19 1954  NA 133*  K 4.9  CL 100  CO2 25  GLUCOSE 341*  BUN 14  CREATININE 1.03  CALCIUM 9.0  AST 19  ALT 22  ALKPHOS 87  BILITOT 0.7   ------------------------------------------------------------------------------------------------------------------  ------------------------------------------------------------------------------------------------------------------ GFR: Estimated Creatinine Clearance: 72.1 mL/min (by C-G formula based on SCr of 1.03 mg/dL). Liver Function Tests: Recent Labs  Lab 02/24/19 1954  AST 19  ALT 22  ALKPHOS 87  BILITOT 0.7  PROT 6.7  ALBUMIN 3.7   No results for input(s): LIPASE, AMYLASE in the last 168 hours. No results for input(s): AMMONIA in the last 168 hours. Coagulation Profile: No results for input(s): INR, PROTIME in the last 168 hours. Cardiac Enzymes: No results for input(s): CKTOTAL, CKMB,  CKMBINDEX, TROPONINI in the last 168 hours. BNP (last 3 results) No results for input(s): PROBNP in the last 8760 hours. HbA1C: No results for input(s): HGBA1C in the last 72 hours. CBG: Recent Labs  Lab 02/24/19 1741  GLUCAP 335*   Lipid Profile: No results for input(s): CHOL, HDL, LDLCALC, TRIG, CHOLHDL, LDLDIRECT in the last 72 hours. Thyroid Function Tests: No results for input(s): TSH, T4TOTAL, FREET4, T3FREE, THYROIDAB in the last 72 hours. Anemia Panel: No results for input(s): VITAMINB12, FOLATE, FERRITIN, TIBC, IRON, RETICCTPCT in the last 72 hours.  --------------------------------------------------------------------------------------------------------------- Urine analysis:    Component Value Date/Time   COLORURINE YELLOW 01/29/2016 1436   APPEARANCEUR CLEAR 01/29/2016 1436   LABSPEC 1.015 01/29/2016 1436   PHURINE 5.5 01/29/2016 1436   GLUCOSEU >1000 (A) 01/29/2016 1436   HGBUR NEGATIVE 01/29/2016 1436   BILIRUBINUR NEGATIVE 01/29/2016 1436   KETONESUR TRACE (A) 01/29/2016 1436   PROTEINUR NEGATIVE 01/29/2016 1436   NITRITE NEGATIVE 01/29/2016 1436   LEUKOCYTESUR NEGATIVE 01/29/2016 1436      Imaging Results:    No results found.     Assessment & Plan:    Active Problems:   Cellulitis   1. Cellulitis/wound infection-we will start vancomycin per pharmacy consultation, obtain wound care consultation.  Follow blood culture results.  Lactic acid mildly elevated 2.1.   2. Diabetes mellitus type  2-hold oral hypoglycemic agents, continue Lantus 30 units subcu daily.  Start sliding scale insulin with NovoLog.  3. Hypertension-continue lisinopril.  4. 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    DVT Prophylaxis-   Lovenox   AM Labs Ordered, also please review Full Orders  Family Communication: Admission, patients condition and plan of care including tests being ordered have been discussed with the patient  who  indicate understanding and agree with the plan and Code Status.  Code Status: Full code  Admission status:/Inpatient: Based on patients clinical presentation and evaluation of above clinical data, I have made determination that patient meets Inpatient criteria at this time.  Time spent in minutes : 60 minutes   Meredeth Ide M.D on 02/24/2019 at 11:20 PM

## 2019-02-24 NOTE — ED Triage Notes (Signed)
Patient was seen today at [facility] medical for follow up on left leg injury occurring on Saturday.  Medical center sent patient to [facility] due to possible infection.

## 2019-02-24 NOTE — Progress Notes (Signed)
Pharmacy Antibiotic Note  CODIE FINIGAN is a 68 y.o. male admitted on 02/24/2019 with cellulitis.  Pharmacy has been consulted for vancomycin dosing.  Plan: Vancomycin 1000mg  IV every 12 hours.  Goal trough 10-15 mcg/mL.  Height: 5\' 5"  (165.1 cm) Weight: 206 lb (93.4 kg) IBW/kg (Calculated) : 61.5  Temp (24hrs), Avg:98 F (36.7 C), Min:98 F (36.7 C), Max:98 F (36.7 C)  Recent Labs  Lab 02/24/19 1954  WBC 10.6*  CREATININE 1.03  LATICACIDVEN 2.1*    Estimated Creatinine Clearance: 72.1 mL/min (by C-G formula based on SCr of 1.03 mg/dL).    Allergies  Allergen Reactions  . Augmentin [Amoxicillin-Pot Clavulanate] Nausea And Vomiting  . Iodine Hives  . Tape Rash    Paper     Antimicrobials this admission: 3/10 ceftriaxone x 1  3/10 vancomycin >>   Microbiology results: 3/10 BCx: sent   Thank you for allowing pharmacy to be a part of this patient's care.  Gerre Pebbles Islam Villescas 02/24/2019 9:17 PM

## 2019-02-24 NOTE — ED Notes (Signed)
ED TO INPATIENT HANDOFF REPORT  ED Nurse Name and Phone #: Claris Gower [phone]  S Name/Age/Gender Kurt [name] 68 y.o. male Room/Bed: APA08/APA08  Code Status   Code Status: Not on file  Home/SNF/Other Home {Patient oriented to oriented x 4 Is this baseline? yes Triage Complete: Triage complete  Chief Complaint Foot Injury  Triage Note Patient was seen today at [facility] medical for follow up on left leg injury occurring on Saturday.  Medical center sent patient to [facility] due to possible infection.     Allergies Allergies  Allergen Reactions  . Augmentin [Amoxicillin-Pot Clavulanate] Nausea And Vomiting  . Iodine Hives  . Tape Rash    Paper     Level of Care/Admitting Diagnosis ED Disposition    ED Disposition Condition Comment   Admit  Hospital Area: [facility] [[id number]]  Level of Care: Med-Surg [16]  Diagnosis: Cellulitis [[id number]]  Admitting Physician: Meredeth Ide [[id number]]  Attending Physician: Meredeth Ide [[id number]]  Estimated length of stay: 3 - 4 days  Certification:: I certify this patient will need inpatient services for at least 2 midnights  PT Class (Do Not Modify): Inpatient [101]  PT Acc Code (Do Not Modify): Private [1]       B Medical/Surgery History Past Medical History:  Diagnosis Date  . Bronchitis   . Diabetes mellitus without complication (HCC)   . Hypertension   . Osteogenesis imperfecta   . Post-polio syndrome    Past Surgical History:  Procedure Laterality Date  . ANKLE FRACTURE SURGERY Bilateral   . arm surgery    . EYE MUSCLE SURGERY    . LEG SURGERY Left      A IV Location/Drains/Wounds Patient Lines/Drains/Airways Status   Active Line/Drains/Airways    Name:   Placement date:   Placement time:   Site:   Days:   Peripheral IV 02/24/19 Hand   02/24/19    2115    Hand   less than 1          Intake/Output Last 24 hours  Intake/Output Summary (Last 24 hours) at 02/24/2019 2353 Last data filed at 02/24/2019  2225 Gross per 24 hour  Intake 100 ml  Output -  Net 100 ml    Labs/Imaging Results for orders placed or performed during the hospital encounter of 02/24/19 (from the past 48 hour(s))  POC CBG, ED     Status: Abnormal   Collection Time: 02/24/19  5:41 PM  Result Value Ref Range   Glucose-Capillary 335 (H) 70 - 99 mg/dL  Comprehensive metabolic panel     Status: Abnormal   Collection Time: 02/24/19  7:54 PM  Result Value Ref Range   Sodium 133 (L) 135 - 145 mmol/L   Potassium 4.9 3.5 - 5.1 mmol/L   Chloride 100 98 - 111 mmol/L   CO2 25 22 - 32 mmol/L   Glucose, Bld 341 (H) 70 - 99 mg/dL   BUN 14 8 - 23 mg/dL   Creatinine, Ser 1.21 0.61 - 1.24 mg/dL   Calcium 9.0 8.9 - 62.4 mg/dL   Total Protein 6.7 6.5 - 8.1 g/dL   Albumin 3.7 3.5 - 5.0 g/dL   AST 19 15 - 41 U/L   ALT 22 0 - 44 U/L   Alkaline Phosphatase 87 38 - 126 U/L   Total Bilirubin 0.7 0.3 - 1.2 mg/dL   GFR calc non Af Amer >60 >60 mL/min   GFR calc Af Amer >60 >60 mL/min   Anion  gap 8 5 - 15    Comment: Performed at [facility], [address], [place], [place] [address]  CBC with Differential     Status: Abnormal   Collection Time: 02/24/19  7:54 PM  Result Value Ref Range   WBC 10.6 (H) 4.0 - 10.5 K/uL   RBC 4.68 4.22 - 5.81 MIL/uL   Hemoglobin 13.5 13.0 - 17.0 g/dL   HCT 60.4 54.0 - 98.1 %   MCV 91.0 80.0 - 100.0 fL   MCH 28.8 26.0 - 34.0 pg   MCHC 31.7 30.0 - 36.0 g/dL   RDW 19.1 47.8 - 29.5 %   Platelets 312 150 - 400 K/uL   nRBC 0.0 0.0 - 0.2 %   Neutrophils Relative % 67 %   Neutro Abs 7.2 1.7 - 7.7 K/uL   Lymphocytes Relative 22 %   Lymphs Abs 2.3 0.7 - 4.0 K/uL   Monocytes Relative 8 %   Monocytes Absolute 0.9 0.1 - 1.0 K/uL   Eosinophils Relative 1 %   Eosinophils Absolute 0.1 0.0 - 0.5 K/uL   Basophils Relative 1 %   Basophils Absolute 0.1 0.0 - 0.1 K/uL   Immature Granulocytes 1 %   Abs Immature Granulocytes 0.06 0.00 - 0.07 K/uL    Comment: Performed at [facility], [address],  [place], [place] [address]  Lactic acid, plasma     Status: Abnormal   Collection Time: 02/24/19  7:54 PM  Result Value Ref Range   Lactic Acid, Venous 2.1 (HH) 0.5 - 1.9 mmol/L    Comment: CRITICAL RESULT CALLED TO, READ BACK BY AND VERIFIED WITH: Jaquasia Doscher,C ON 02/24/19 AT 2035 BY LOY,C Performed at [facility], [address], [place], [place] [address]    No results found.  Pending Labs Unresulted Labs (From admission, onward)    Start     Ordered   02/24/19 2040  Blood Culture (routine x 2)  BLOOD CULTURE X 2,   STAT     02/24/19 2040   Signed and Held  HIV antibody (Routine Testing)  Once,   R     Signed and Held   Signed and Held  CBC  (enoxaparin (LOVENOX)    CrCl >/= 30 ml/min)  Once,   R    Comments:  Baseline for enoxaparin therapy IF NOT ALREADY DRAWN.  Notify MD if PLT < 100 K.    Signed and Held   Signed and Held  Creatinine, serum  (enoxaparin (LOVENOX)    CrCl >/= 30 ml/min)  Once,   R    Comments:  Baseline for enoxaparin therapy IF NOT ALREADY DRAWN.    Signed and Held   Signed and Held  Creatinine, serum  (enoxaparin (LOVENOX)    CrCl >/= 30 ml/min)  Weekly,   R    Comments:  while on enoxaparin therapy    Signed and Held   Signed and Held  CBC  Tomorrow morning,   R     Signed and Held   Signed and Held  Comprehensive metabolic panel  Tomorrow morning,   R     Signed and Held   Signed and Held  Hemoglobin A1c  Once,   R    Comments:  To assess prior glycemic control    Signed and Held          Vitals/Pain Today's Vitals   02/24/19 1736 02/24/19 2128 02/24/19 2304 02/24/19 2330  BP:  124/89 134/88 (!) 142/93  Pulse:  99  83 84  Resp:  18 18 17   Temp:      TempSrc:      SpO2:  99% 100% 100%  Weight: 93.4 kg     Height: 5\' 5"  (1.651 m)     PainSc: 4        Isolation Precautions No active isolations  Medications Medications  vancomycin (VANCOCIN) IVPB 1000 mg/200 mL premix (1,000 mg Intravenous New Bag/Given 02/24/19 2352)  vancomycin (VANCOCIN)  IVPB 1000 mg/200 mL premix (has no administration in time range)  cefTRIAXone (ROCEPHIN) 2 g in sodium chloride 0.9 % 100 mL IVPB (0 g Intravenous Stopped 02/24/19 2225)    Mobility walks with device Low fall risk   Focused Assessments skin   R Recommendations: See Admitting Provider Note  Report given to:   Additional Notes: NA

## 2019-02-24 NOTE — ED Notes (Signed)
Blood cultures drawn x 2 

## 2019-02-24 NOTE — ED Notes (Signed)
Date and time results received: 02/24/19 8:38 PM (use smartphrase ".now" to insert current time)  Test: lactic acid Critical Value: 2.1  Name of Provider Notified: Dr Mariel Aloe  Orders Received? Or Actions Taken?: no new orders at this time.

## 2019-02-25 ENCOUNTER — Other Ambulatory Visit: Payer: Self-pay

## 2019-02-25 LAB — COMPREHENSIVE METABOLIC PANEL
ALK PHOS: 67 U/L (ref 38–126)
ALT: 19 U/L (ref 0–44)
AST: 16 U/L (ref 15–41)
Albumin: 3.5 g/dL (ref 3.5–5.0)
Anion gap: 9 (ref 5–15)
BILIRUBIN TOTAL: 0.8 mg/dL (ref 0.3–1.2)
BUN: 12 mg/dL (ref 8–23)
CALCIUM: 8.6 mg/dL — AB (ref 8.9–10.3)
CO2: 23 mmol/L (ref 22–32)
Chloride: 103 mmol/L (ref 98–111)
Creatinine, Ser: 0.76 mg/dL (ref 0.61–1.24)
GFR calc Af Amer: >60 mL/min (ref >60)
Glucose, Bld: 261 mg/dL — ABNORMAL HIGH (ref 70–99)
Potassium: 3.8 mmol/L (ref 3.5–5.1)
Sodium: 135 mmol/L (ref 135–145)
TOTAL PROTEIN: 6.6 g/dL (ref 6.5–8.1)

## 2019-02-25 LAB — CBC
HEMATOCRIT: 39.8 % (ref 39.0–52.0)
Hemoglobin: 12.4 g/dL — ABNORMAL LOW (ref 13.0–17.0)
MCH: 28.2 pg (ref 26.0–34.0)
MCHC: 31.2 g/dL (ref 30.0–36.0)
MCV: 90.7 fL (ref 80.0–100.0)
Platelets: 315 10*3/uL (ref 150–400)
RBC: 4.39 MIL/uL (ref 4.22–5.81)
RDW: 14.3 % (ref 11.5–15.5)
WBC: 12.8 10*3/uL — ABNORMAL HIGH (ref 4.0–10.5)
nRBC: 0 % (ref 0.0–0.2)

## 2019-02-25 LAB — GLUCOSE, CAPILLARY
Glucose-Capillary: 201 mg/dL — ABNORMAL HIGH (ref 70–99)
Glucose-Capillary: 212 mg/dL — ABNORMAL HIGH (ref 70–99)
Glucose-Capillary: 236 mg/dL — ABNORMAL HIGH (ref 70–99)
Glucose-Capillary: 255 mg/dL — ABNORMAL HIGH (ref 70–99)
Glucose-Capillary: 285 mg/dL — ABNORMAL HIGH (ref 70–99)

## 2019-02-25 LAB — HEMOGLOBIN A1C
Hgb A1c MFr Bld: 9 % — ABNORMAL HIGH (ref 4.8–5.6)
Mean Plasma Glucose: 211.6 mg/dL

## 2019-02-25 MED ORDER — INSULIN ASPART 100 UNIT/ML ~~LOC~~ SOLN
0.0000 [IU] | Freq: Three times a day (TID) | SUBCUTANEOUS | Status: DC
  Administered 2019-02-25: 2 [IU] via SUBCUTANEOUS

## 2019-02-25 MED ORDER — ONDANSETRON HCL 4 MG PO TABS: 4.0000 mg | ORAL_TABLET | Freq: Four times a day (QID) | ORAL | Status: DC | PRN

## 2019-02-25 MED ORDER — SODIUM CHLORIDE 0.9 % IV SOLN: INTRAVENOUS | Status: DC

## 2019-02-25 MED ORDER — INSULIN ASPART 100 UNIT/ML ~~LOC~~ SOLN
5.0000 [IU] | Freq: Three times a day (TID) | SUBCUTANEOUS | Status: DC
  Administered 2019-02-25 – 2019-02-27 (×5): 5 [IU] via SUBCUTANEOUS

## 2019-02-25 MED ORDER — HYDROMORPHONE HCL 1 MG/ML IJ SOLN
1.0000 mg | INTRAMUSCULAR | Status: DC | PRN
  Administered 2019-02-25 – 2019-02-27 (×10): 1 mg via INTRAVENOUS
  Filled 2019-02-25 (×10): qty 1

## 2019-02-25 MED ORDER — LISINOPRIL 10 MG PO TABS
20.0000 mg | ORAL_TABLET | Freq: Every day | ORAL | Status: DC
  Administered 2019-02-25 – 2019-02-27 (×3): 20 mg via ORAL
  Filled 2019-02-25 (×3): qty 2

## 2019-02-25 MED ORDER — ENOXAPARIN SODIUM 40 MG/0.4ML ~~LOC~~ SOLN
40.0000 mg | SUBCUTANEOUS | Status: DC
  Administered 2019-02-25 – 2019-02-26 (×3): 40 mg via SUBCUTANEOUS
  Filled 2019-02-25 (×3): qty 0.4

## 2019-02-25 MED ORDER — ONDANSETRON HCL 4 MG/2ML IJ SOLN: 4.0000 mg | Freq: Four times a day (QID) | INTRAMUSCULAR | Status: DC | PRN

## 2019-02-25 MED ORDER — INSULIN ASPART 100 UNIT/ML ~~LOC~~ SOLN
0.0000 [IU] | Freq: Every day | SUBCUTANEOUS | Status: DC
  Administered 2019-02-25: 3 [IU] via SUBCUTANEOUS

## 2019-02-25 MED ORDER — INSULIN ASPART 100 UNIT/ML ~~LOC~~ SOLN
0.0000 [IU] | Freq: Three times a day (TID) | SUBCUTANEOUS | Status: DC
  Administered 2019-02-25: 8 [IU] via SUBCUTANEOUS
  Administered 2019-02-25: 5 [IU] via SUBCUTANEOUS

## 2019-02-25 MED ORDER — INSULIN GLARGINE 100 UNIT/ML ~~LOC~~ SOLN
30.0000 [IU] | Freq: Every day | SUBCUTANEOUS | Status: DC
  Administered 2019-02-25 – 2019-02-26 (×3): 30 [IU] via SUBCUTANEOUS
  Filled 2019-02-25 (×6): qty 0.3

## 2019-02-25 MED ORDER — SIMVASTATIN 20 MG PO TABS
20.0000 mg | ORAL_TABLET | Freq: Every evening | ORAL | Status: DC
  Administered 2019-02-25 – 2019-02-26 (×2): 20 mg via ORAL
  Filled 2019-02-25 (×2): qty 1

## 2019-02-25 MED ORDER — ACETAMINOPHEN 325 MG PO TABS
650.0000 mg | ORAL_TABLET | Freq: Four times a day (QID) | ORAL | Status: DC | PRN
  Filled 2019-02-25: qty 2

## 2019-02-25 NOTE — Progress Notes (Signed)
Pt arrived from ED with no s/s of distress or discomfort. Oriented to room. Oriented to call bell. Will continue to monitor.

## 2019-02-25 NOTE — Progress Notes (Signed)
PROGRESS NOTE    Kurt [name]  KNL:[id number] DOB: May 14, 1951 DOA: 02/24/2019 PCP: Altamease Oiler, FNP   Brief Narrative:  Per HPI:  Kurt [name]  is a 68 y.o. male, with history of diabetes mellitus type 2, hypertension, osteogenesis imperfecta, post polio syndrome came to ED for evaluation of left leg injury.  4 days ago patient fell with his left leg went through floor air vent.  His entire leg went into duct up to the upper thigh.  He sustained lacerations and abrasions to left lower leg with bruising in upper thigh.  Patient was seen at PCP office and was started on clindamycin.  Patient noticed that his left leg acutely worsened with more redness and pain.  So he came to ED for reevaluation. Patient was started on vancomycin and ceftriaxone. He denies fever or chills. Denies nausea vomiting or diarrhea. Denies chest pain or shortness of breath.  Patient was admitted with cellulitis/wound infection to the left leg and has been started on vancomycin.  Assessment & Plan:   Active Problems:   Cellulitis  1. Cellulitis/wound infection-we will start vancomycin per pharmacy consultation, obtain wound care consultation.  Follow blood culture results; negative for 12 hours.  Lactic acid mildly elevated 2.1.  2. Diabetes mellitus type 2-hold oral hypoglycemic agents, continue Lantus 30 units subcu daily.  Increase SSI to moderate for better control.  3. Hypertension-continue lisinopril. Currently stable.  4. 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   DVT prophylaxis: Lovenox Code Status: Full Family Communication: None at bedside Disposition Plan: Continue treatment with IV vancomycin and monitor blood cultures.  Wound care evaluation.  Anticipate discharge in 48 hours.   Consultants:   None  Procedures:   None  Antimicrobials:   Vancomycin 3/10->  Rocephin 3/10-3/10   Subjective: Patient seen and evaluated today with  no new acute complaints or concerns. No acute concerns or events noted overnight.  He continues to have ongoing, intermittent pain to his left leg.  Dressings were changed last night.  Objective: Vitals:   02/24/19 2304 02/24/19 2330 02/25/19 0024 02/25/19 0514  BP: 134/88 (!) 142/93 (!) 143/90 123/79  Pulse: 83 84 89 96  Resp: 18 17 20 18   Temp:   99 F (37.2 C) 98.9 F (37.2 C)  TempSrc:   Oral Oral  SpO2: 100% 100% 100% 96%  Weight:   92.2 kg   Height:   5\' 5"  (1.651 m)     Intake/Output Summary (Last 24 hours) at 02/25/2019 0945 Last data filed at 02/25/2019 0900 Gross per 24 hour  Intake 1180 ml  Output -  Net 1180 ml   Filed Weights   02/24/19 1736 02/25/19 0024  Weight: 93.4 kg 92.2 kg    Examination:  General exam: Appears calm and comfortable  Respiratory system: Clear to auscultation. Respiratory effort normal. Cardiovascular system: S1 & S2 heard, RRR. No JVD, murmurs, rubs, gallops or clicks. No pedal edema. Gastrointestinal system: Abdomen is nondistended, soft and nontender. No organomegaly or masses felt. Normal bowel sounds heard. Central nervous system: Alert and oriented. No focal neurological deficits. Extremities: Symmetric 5 x 5 power. Skin: Left leg covered with abrasions.  Dressing is currently clean dry and intact.  Will evaluate after wound care sees patient. Psychiatry: Judgement and insight appear normal. Mood & affect appropriate.     Data Reviewed: I have personally reviewed following labs and imaging studies  CBC: Recent Labs  Lab 02/24/19 1954 02/25/19 0441  WBC 10.6* 12.8*  NEUTROABS 7.2  --   HGB 13.5 12.4*  HCT 42.6 39.8  MCV 91.0 90.7  PLT 312 315   Basic Metabolic Panel: Recent Labs  Lab 02/24/19 1954 02/25/19 0441  NA 133* 135  K 4.9 3.8  CL 100 103  CO2 25 23  GLUCOSE 341* 261*  BUN 14 12  CREATININE 1.03 0.76  CALCIUM 9.0 8.6*   GFR: Estimated Creatinine Clearance: 92.3 mL/min (by C-G formula based on SCr of  0.76 mg/dL). Liver Function Tests: Recent Labs  Lab 02/24/19 1954 02/25/19 0441  AST 19 16  ALT 22 19  ALKPHOS 87 67  BILITOT 0.7 0.8  PROT 6.7 6.6  ALBUMIN 3.7 3.5   No results for input(s): LIPASE, AMYLASE in the last 168 hours. No results for input(s): AMMONIA in the last 168 hours. Coagulation Profile: No results for input(s): INR, PROTIME in the last 168 hours. Cardiac Enzymes: No results for input(s): CKTOTAL, CKMB, CKMBINDEX, TROPONINI in the last 168 hours. BNP (last 3 results) No results for input(s): PROBNP in the last 8760 hours. HbA1C: No results for input(s): HGBA1C in the last 72 hours. CBG: Recent Labs  Lab 02/24/19 1741 02/25/19 0028 02/25/19 0726  GLUCAP 335* 236* 212*   Lipid Profile: No results for input(s): CHOL, HDL, LDLCALC, TRIG, CHOLHDL, LDLDIRECT in the last 72 hours. Thyroid Function Tests: No results for input(s): TSH, T4TOTAL, FREET4, T3FREE, THYROIDAB in the last 72 hours. Anemia Panel: No results for input(s): VITAMINB12, FOLATE, FERRITIN, TIBC, IRON, RETICCTPCT in the last 72 hours. Sepsis Labs: Recent Labs  Lab 02/24/19 1954  LATICACIDVEN 2.1*    Recent Results (from the past 240 hour(s))  Blood Culture (routine x 2)     Status: None (Preliminary result)   Collection Time: 02/24/19  9:06 PM  Result Value Ref Range Status   Specimen Description BLOOD LEFT ANTECUBITAL  Final   Special Requests   Final    BOTTLES DRAWN AEROBIC AND ANAEROBIC Blood Culture adequate volume   Culture   Final    NO GROWTH < 12 HOURS Performed at [facility], [address] [place], [place] [address]    Report Status PENDING  Incomplete  Blood Culture (routine x 2)     Status: None (Preliminary result)   Collection Time: 02/24/19  9:06 PM  Result Value Ref Range Status   Specimen Description BLOOD RIGHT HAND  Final   Special Requests AEROBIC BOTTLE ONLY Blood Culture adequate volume  Final   Culture   Final    NO GROWTH < 12 HOURS Performed at  [facility], [address] [place], [place] [address]    Report Status PENDING  Incomplete         Radiology Studies: No results found.      Scheduled Meds: . enoxaparin (LOVENOX) injection  40 mg Subcutaneous Q24H  . insulin aspart  0-15 Units Subcutaneous TID WC  . insulin aspart  0-5 Units Subcutaneous QHS  . insulin glargine  30 Units Subcutaneous QHS  . lisinopril  20 mg Oral Daily  . simvastatin  20 mg Oral QPM   Continuous Infusions: . sodium chloride    . vancomycin 1,000 mg (02/25/19 0842)     LOS: 1 day    Time spent: 30 minutes     Hoover Brunette, DO [facility] Pager [phone]  If 7PM-7AM, please contact night-coverage [email or web address] Password [facility] 02/25/2019, 9:45 AM

## 2019-02-25 NOTE — Progress Notes (Signed)
Inpatient Diabetes Program Recommendations  AACE/ADA: New Consensus Statement on Inpatient Glycemic Control (2015)  Target Ranges:  Prepandial:   less than 140 mg/dL      Peak postprandial:   less than 180 mg/dL (1-2 hours)      Critically ill patients:  140 - 180 mg/dL   Lab Results  Component Value Date   GLUCAP 285 (H) 02/25/2019   HGBA1C 9.0 (H) 02/24/2019    Review of Glycemic Control Results for Kurt [name], Kurt [name] (MRN [id number]) as of 02/25/2019 13:46  Ref. Range 02/24/2019 17:41 02/25/2019 00:28 02/25/2019 07:26 02/25/2019 11:21  Glucose-Capillary Latest Ref Range: 70 - 99 mg/dL 585 (H) 929 (H) 244 (H) 285 (H)   Diabetes history: DM2 Outpatient Diabetes medications: Lantus 30 units +Glucotrol 10 mg + Janumet 100-1gm qd Current orders for Inpatient glycemic control: Lantus 30 units + Novolog correction moderate tid + hs  Inpatient Diabetes Program Recommendations:   Noted postprandial CBGs elevated While Glucotrol held, Add Novolog 5 units tid meal coverage if eats 50%  Thank you, Darel Hong E. Nyelli Samara, RN, MSN, CDE  Diabetes Coordinator Inpatient Glycemic Control Team Team Pager [phone] (8am-5pm) 02/25/2019 1:49 PM

## 2019-02-25 NOTE — Consult Note (Signed)
WOC Nurse wound consult note Reason for Consult:  Two LLE wounds sustained in fall. Now with cellulitis  Wound type:Trauma Pressure Injury POA: NA Measurement: Lateral left LE: 5cm x 4cm x 0.1cm with red, moist wound bed Posterior left LE: 10cm x 6cm x 0.1cm with red, moist wound bed Wound bed: As described above Drainage (amount, consistency, odor) small amount serous drainage Periwound: intact, dry Dressing procedure/placement/frequency: Patient is on systemic antibiotics. We will implement placement of a xeroform gauze (folded) dressing, topped with dry gauze and an ABD pad for comfort prior to securement. Wound is to be cleansed with NS.  Elevate LE on pillow.  WOC nursing team will not follow, but will remain available to this patient, the nursing and medical teams.  Please re-consult if needed. Thanks, Ladona Mow, MSN, RN, GNP, [place]  Pager# [phone]

## 2019-02-26 LAB — CBC
HEMATOCRIT: 40.6 % (ref 39.0–52.0)
Hemoglobin: 12.5 g/dL — ABNORMAL LOW (ref 13.0–17.0)
MCH: 28.2 pg (ref 26.0–34.0)
MCHC: 30.8 g/dL (ref 30.0–36.0)
MCV: 91.4 fL (ref 80.0–100.0)
Platelets: 311 10*3/uL (ref 150–400)
RBC: 4.44 MIL/uL (ref 4.22–5.81)
RDW: 14.2 % (ref 11.5–15.5)
WBC: 11.4 10*3/uL — ABNORMAL HIGH (ref 4.0–10.5)
nRBC: 0 % (ref 0.0–0.2)

## 2019-02-26 LAB — HIV ANTIBODY (ROUTINE TESTING W REFLEX): HIV Screen 4th Generation wRfx: NONREACTIVE

## 2019-02-26 LAB — GLUCOSE, CAPILLARY
Glucose-Capillary: 205 mg/dL — ABNORMAL HIGH (ref 70–99)
Glucose-Capillary: 342 mg/dL — ABNORMAL HIGH (ref 70–99)
Glucose-Capillary: 253 mg/dL — ABNORMAL HIGH (ref 70–99)
Glucose-Capillary: 195 mg/dL — ABNORMAL HIGH (ref 70–99)

## 2019-02-26 MED ORDER — INSULIN ASPART 100 UNIT/ML ~~LOC~~ SOLN
0.0000 [IU] | Freq: Three times a day (TID) | SUBCUTANEOUS | Status: DC
  Administered 2019-02-26: 11 [IU] via SUBCUTANEOUS
  Administered 2019-02-26: 15 [IU] via SUBCUTANEOUS
  Administered 2019-02-27: 4 [IU] via SUBCUTANEOUS
  Administered 2019-02-27: 11 [IU] via SUBCUTANEOUS

## 2019-02-26 NOTE — Progress Notes (Signed)
PROGRESS NOTE    Kurt [name]  YDX:[id number] DOB: 19-Dec-1950 DOA: 02/24/2019 PCP: Altamease Oiler, FNP   Brief Narrative:  Per HPI: [name],with history of diabetes mellitus type 2, hypertension, osteogenesis imperfecta, post polio syndrome came to ED for evaluation of left leg injury. 4 days ago patient fell with his left leg went through floor air vent.His entire leg went into duct up to the upper thigh. He sustained lacerations and abrasions to left lower leg with bruising in upper thigh. Patient was seen at PCP office and was started on clindamycin. Patient noticed that his left leg acutely worsened with more redness and pain. So he came to ED for reevaluation. Patient was started on vancomycin and ceftriaxone. He denies fever or chills. Denies nausea vomiting or diarrhea. Denies chest pain or shortness of breath.  Patient was admitted with cellulitis/wound infection to the left leg and has been started on vancomycin.  Assessment & Plan:   Active Problems:   Cellulitis   1. Cellulitis/wound infection-we will start vancomycin per pharmacy consultation, obtain wound care consultation.  This is slowly improving, but patient is not back to baseline. Follow blood culture results; negative for 24 hours. Lactic acid mildly elevated 2.1.  2. Diabetes mellitus type 2-hold oral hypoglycemic agents, continue Lantus 30 units subcu daily. Increase SSI to moderate for better control today.  3. Hypertension-continue lisinopril. Currently stable.  4. 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   DVT prophylaxis: Lovenox Code Status: Full Family Communication: None at bedside Disposition Plan: Continue treatment with IV vancomycin and monitor blood cultures.  Wound care evaluation performed and appreciated.  Anticipate discharge in 24 hours.   Consultants:   None  Procedures:   None   Antimicrobials:   Vancomycin 3/10->  Rocephin 3/10-3/10  Subjective: Patient seen and evaluated today with no new acute complaints or concerns. No acute concerns or events noted overnight.  He continues to have leg pain and erythema, but this is slowly improving.  Wounds were evaluated yesterday.  Objective: Vitals:   02/25/19 1312 02/25/19 2054 02/25/19 2119 02/26/19 0544  BP: 130/79  (!) 143/78 135/86  Pulse: 96  89 81  Resp: 16  18 18   Temp: 98.4 F (36.9 C)  98.7 F (37.1 C) 98.4 F (36.9 C)  TempSrc: Oral  Oral Oral  SpO2: 96% 97% 98% 95%  Weight:      Height:        Intake/Output Summary (Last 24 hours) at 02/26/2019 1140 Last data filed at 02/26/2019 0700 Gross per 24 hour  Intake 480 ml  Output 1450 ml  Net -970 ml   Filed Weights   02/24/19 1736 02/25/19 0024  Weight: 93.4 kg 92.2 kg    Examination:  General exam: Appears calm and comfortable  Respiratory system: Clear to auscultation. Respiratory effort normal. Cardiovascular system: S1 & S2 heard, RRR. No JVD, murmurs, rubs, gallops or clicks. No pedal edema. Gastrointestinal system: Abdomen is nondistended, soft and nontender. No organomegaly or masses felt. Normal bowel sounds heard. Central nervous system: Alert and oriented. No focal neurological deficits. Extremities: Symmetric 5 x 5 power. Skin: No rashes, lesions or ulcers, left lower extremity abrasions and wounds with erythema and tenderness noted.  Dressings are clean dry and intact. Psychiatry: Judgement and insight appear normal. Mood & affect appropriate.     Data Reviewed: I have personally reviewed following labs and imaging studies  CBC: Recent Labs  Lab 02/24/19 1954 02/25/19 0441  02/26/19 0450  WBC 10.6* 12.8* 11.4*  NEUTROABS 7.2  --   --   HGB 13.5 12.4* 12.5*  HCT 42.6 39.8 40.6  MCV 91.0 90.7 91.4  PLT 312 315 311   Basic Metabolic Panel: Recent Labs  Lab 02/24/19 1954 02/25/19 0441  NA 133* 135  K 4.9 3.8  CL  100 103  CO2 25 23  GLUCOSE 341* 261*  BUN 14 12  CREATININE 1.03 0.76  CALCIUM 9.0 8.6*   GFR: Estimated Creatinine Clearance: 92.3 mL/min (by C-G formula based on SCr of 0.76 mg/dL). Liver Function Tests: Recent Labs  Lab 02/24/19 1954 02/25/19 0441  AST 19 16  ALT 22 19  ALKPHOS 87 67  BILITOT 0.7 0.8  PROT 6.7 6.6  ALBUMIN 3.7 3.5   No results for input(s): LIPASE, AMYLASE in the last 168 hours. No results for input(s): AMMONIA in the last 168 hours. Coagulation Profile: No results for input(s): INR, PROTIME in the last 168 hours. Cardiac Enzymes: No results for input(s): CKTOTAL, CKMB, CKMBINDEX, TROPONINI in the last 168 hours. BNP (last 3 results) No results for input(s): PROBNP in the last 8760 hours. HbA1C: Recent Labs    02/24/19 1954  HGBA1C 9.0*   CBG: Recent Labs  Lab 02/25/19 1121 02/25/19 1616 02/25/19 2125 02/26/19 0731 02/26/19 1114  GLUCAP 285* 201* 255* 205* 342*   Lipid Profile: No results for input(s): CHOL, HDL, LDLCALC, TRIG, CHOLHDL, LDLDIRECT in the last 72 hours. Thyroid Function Tests: No results for input(s): TSH, T4TOTAL, FREET4, T3FREE, THYROIDAB in the last 72 hours. Anemia Panel: No results for input(s): VITAMINB12, FOLATE, FERRITIN, TIBC, IRON, RETICCTPCT in the last 72 hours. Sepsis Labs: Recent Labs  Lab 02/24/19 1954  LATICACIDVEN 2.1*    Recent Results (from the past 240 hour(s))  Blood Culture (routine x 2)     Status: None (Preliminary result)   Collection Time: 02/24/19  9:06 PM  Result Value Ref Range Status   Specimen Description BLOOD LEFT ANTECUBITAL  Final   Special Requests   Final    BOTTLES DRAWN AEROBIC AND ANAEROBIC Blood Culture adequate volume   Culture   Final    NO GROWTH 2 DAYS Performed at [facility], [address], [place], [place] [address]    Report Status PENDING  Incomplete  Blood Culture (routine x 2)     Status: None (Preliminary result)   Collection Time: 02/24/19  9:06 PM  Result  Value Ref Range Status   Specimen Description BLOOD RIGHT HAND  Final   Special Requests AEROBIC BOTTLE ONLY Blood Culture adequate volume  Final   Culture   Final    NO GROWTH 2 DAYS Performed at [facility], [address], [place], [place] [address]    Report Status PENDING  Incomplete         Radiology Studies: No results found.      Scheduled Meds: . enoxaparin (LOVENOX) injection  40 mg Subcutaneous Q24H  . insulin aspart  0-20 Units Subcutaneous TID WC  . insulin aspart  0-5 Units Subcutaneous QHS  . insulin aspart  5 Units Subcutaneous TID WC  . insulin glargine  30 Units Subcutaneous QHS  . lisinopril  20 mg Oral Daily  . simvastatin  20 mg Oral QPM   Continuous Infusions: . sodium chloride    . vancomycin 1,000 mg (02/25/19 2238)     LOS: 2 days    Time spent: 30 minutes    Mckinnley Smithey Hoover Brunette, DO [facility] Pager [phone]  If 7PM-7AM, please contact night-coverage [email or web address] Password [email or web address] 02/26/2019, 11:40 AM

## 2019-02-27 LAB — BASIC METABOLIC PANEL
Anion gap: 9 (ref 5–15)
BUN: 17 mg/dL (ref 8–23)
CO2: 24 mmol/L (ref 22–32)
Calcium: 8.8 mg/dL — ABNORMAL LOW (ref 8.9–10.3)
Chloride: 99 mmol/L (ref 98–111)
Creatinine, Ser: 0.92 mg/dL (ref 0.61–1.24)
GFR calc non Af Amer: >60 mL/min (ref >60)
Glucose, Bld: 240 mg/dL — ABNORMAL HIGH (ref 70–99)
Potassium: 4.3 mmol/L (ref 3.5–5.1)
Sodium: 132 mmol/L — ABNORMAL LOW (ref 135–145)

## 2019-02-27 LAB — CBC
HCT: 41.3 % (ref 39.0–52.0)
HEMOGLOBIN: 13 g/dL (ref 13.0–17.0)
MCH: 28.5 pg (ref 26.0–34.0)
MCHC: 31.5 g/dL (ref 30.0–36.0)
MCV: 90.6 fL (ref 80.0–100.0)
Platelets: 332 10*3/uL (ref 150–400)
RBC: 4.56 MIL/uL (ref 4.22–5.81)
RDW: 14.1 % (ref 11.5–15.5)
WBC: 12.2 10*3/uL — AB (ref 4.0–10.5)
nRBC: 0 % (ref 0.0–0.2)

## 2019-02-27 LAB — GLUCOSE, CAPILLARY
Glucose-Capillary: 198 mg/dL — ABNORMAL HIGH (ref 70–99)
Glucose-Capillary: 282 mg/dL — ABNORMAL HIGH (ref 70–99)

## 2019-02-27 MED ORDER — HYDROCODONE-ACETAMINOPHEN 7.5-325 MG PO TABS: 1.0000 | ORAL_TABLET | Freq: Four times a day (QID) | ORAL | 0 refills | Status: DC | PRN

## 2019-02-27 MED ORDER — DOXYCYCLINE HYCLATE 100 MG PO CAPS: 100.0000 mg | ORAL_CAPSULE | Freq: Two times a day (BID) | ORAL | 0 refills | Status: AC

## 2019-02-27 MED ORDER — DOXYCYCLINE HYCLATE 100 MG PO CAPS: 100.0000 mg | ORAL_CAPSULE | Freq: Two times a day (BID) | ORAL | 0 refills | Status: DC

## 2019-02-27 NOTE — TOC Initial Note (Signed)
Transition of Care [facility]) - Initial/Assessment Note    Patient Details  Name: Kurt [name] MRN: [id number] Date of Birth: 1951-09-15  [facility] CM/SW Contact:    Loc Feinstein, Chrystine Oiler, RN Phone Number: 02/27/2019, 12:15 PM  Clinical Narrative:                From home, independent. LE cellulitis. Wife plans to help with wrapping legs. Home health RN ordered. Patient agreeable. CMS list of agencies discussed, no preference. Patient lives in [place], will refer to [facility].    Expected Discharge Plan: Home w Home Health Services Barriers to Discharge: No Barriers Identified   Patient Goals and CMS Choice Patient states their goals for this hospitalization and ongoing recovery are:: to get home, heal up and get active again CMS Medicare.gov Compare Post Acute Care list provided to:: Patient Choice offered to / list presented to : Patient  Expected Discharge Plan and Services Expected Discharge Plan: Home w Home Health Services Discharge Planning Services: CM Consult Post Acute Care Choice: Home Health   Expected Discharge Date: 02/27/19                   HH Arranged: RN(wound care) HH Agency: [facility]  Prior Living Arrangements/Services   Lives with:: Spouse Patient language and need for interpreter reviewed:: No Do you feel safe going back to the place where you live?: Yes      Need for Family Participation in Patient Care: Yes (Comment) Care giver support system in place?: Yes (comment)   Criminal Activity/Legal Involvement Pertinent to Current Situation/Hospitalization: No - Comment as needed  Activities of Daily Living Home Assistive Devices/Equipment: Eyeglasses, Environmental consultant (specify type), Cane (specify quad or straight), Shower chair with back ADL Screening (condition at time of admission) Patient's cognitive ability adequate to safely complete daily activities?: Yes Is the patient deaf or have difficulty hearing?:  No Does the patient have difficulty seeing, even when wearing glasses/contacts?: No Does the patient have difficulty concentrating, remembering, or making decisions?: No Patient able to express need for assistance with ADLs?: Yes Does the patient have difficulty dressing or bathing?: No Independently performs ADLs?: Yes (appropriate for developmental age) Does the patient have difficulty walking or climbing stairs?: Yes Weakness of Legs: Right Weakness of Arms/Hands: Right  Permission Sought/Granted Permission sought to share information with : Other (comment) Permission granted to share information with : Yes, Verbal Permission Granted     Permission granted to share info w AGENCY: [facility]        Emotional Assessment Appearance:: Appears stated age Attitude/Demeanor/Rapport: Engaged Affect (typically observed): Calm Orientation: : Oriented to Self, Oriented to Place, Oriented to  Time, Oriented to Situation      Admission diagnosis:  Cellulitis of left lower extremity [L03.116] Patient Active Problem List   Diagnosis Date Noted  . Cellulitis 02/24/2019  . 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 Osteogenesis imperfecta 12/25/2018  . Diabetes mellitus without complication (HCC) 12/25/2018   PCP:  Altamease Oiler, FNP Pharmacy:   [facility] - [place], [place] - [address] [place] [address] Phone: [phone] Fax: [phone]     Social Determinants of Health (SDOH) Interventions    Readmission Risk Interventions 30 Day Unplanned Readmission Risk Score     ED to Hosp-Admission (Current) from 02/24/2019 in [facility] SURGICAL UNIT  30 Day Unplanned Readmission Risk Score (%)  9 Filed at 02/27/2019 1200  This score is the patient's risk of an unplanned readmission within 30 days of being discharged (0 -100%). The score is based on dignosis, age, lab data, medications, orders, and past utilization.   Low:   0-14.9   Medium: 15-21.9   High: 22-29.9   Extreme: 30 and above       No flowsheet data found.

## 2019-02-27 NOTE — Care Management Important Message (Signed)
Important Message  Patient Details  Name: Kurt [name] MRN: [id number] Date of Birth: 1951/07/09   Medicare Important Message Given:  Yes    Corey Harold 02/27/2019, 2:54 PM

## 2019-02-27 NOTE — Discharge Summary (Signed)
Physician Discharge Summary  Kurt [name] ZOX:[id number] DOB: 1951/01/08 DOA: 02/24/2019  PCP: Altamease OilerHarris, Meredith L, FNP  Admit date: 02/24/2019  Discharge date: 02/27/2019  Admitted From:Home  Disposition:  Home  Recommendations for Outpatient Follow-up:  1. Follow up with PCP in 1-2 weeks 2. Continue on doxycycline 100 mg twice daily for the next 10 days 3. Home health wound care provided 4. Norco refilled for 10 tablets to help with pain management  Home Health: Yes with wound care  Equipment/Devices: None  Discharge Condition: Stable  CODE STATUS: Full  Diet recommendation: Heart Healthy/carb modified  Brief/Interim Summary: Per HPI: [name],with history of diabetes mellitus type 2, hypertension, osteogenesis imperfecta, post polio syndrome came to ED for evaluation of left leg injury. 4 days ago patient fell with his left leg went through floor air vent.His entire leg went into duct up to the upper thigh. He sustained lacerations and abrasions to left lower leg with bruising in upper thigh. Patient was seen at PCP office and was started on clindamycin. Patient noticed that his left leg acutely worsened with more redness and pain. So he came to ED for reevaluation. Patient was started on vancomycin and ceftriaxone. He denies fever or chills. Denies nausea vomiting or diarrhea. Denies chest pain or shortness of breath.  Patient was admitted with cellulitis/wound infection to the left leg and has been started on vancomycin.  With good improvement noted.  He continues to have some mild pain, but erythema has improved.  He has been evaluated by wound care and has had dressing changes with no significant findings noted.  He will be provided home health wound care at this time and will continue on doxycycline for another 10 days to finish the course of treatment.  No other acute events noted during this admission.  Discharge Diagnoses:  Active  Problems:   Cellulitis  Principal discharge diagnosis: Left lower extremity cellulitis/wound infection.  Discharge Instructions  Discharge Instructions    Call MD for:  redness, tenderness, or signs of infection (pain, swelling, redness, odor or green/yellow discharge around incision site)   Complete by:  As directed    Diet - low sodium heart healthy   Complete by:  As directed    Increase activity slowly   Complete by:  As directed      Allergies as of 02/27/2019      Reactions   Augmentin [amoxicillin-pot Clavulanate] Nausea And Vomiting   Iodine Hives   Tape Rash   Paper       Medication List    STOP taking these medications   clindamycin 300 MG capsule Commonly known as:  CLEOCIN     TAKE these medications   acetaminophen 500 MG tablet Commonly known as:  TYLENOL Take 1,000 mg by mouth every 4 (four) hours as needed for mild pain or moderate pain.   ALLERGY EYE OP Apply 1 drop to eye every morning.   doxycycline 100 MG capsule Commonly known as:  VIBRAMYCIN Take 1 capsule (100 mg total) by mouth 2 (two) times daily for 10 days.   glipiZIDE 10 MG 24 hr tablet Commonly known as:  GLUCOTROL XL Take 10 mg by mouth at bedtime.   HYDROcodone-acetaminophen 7.5-325 MG tablet Commonly known as:  NORCO Take 1 tablet by mouth every 6 (six) hours as needed for moderate pain or severe pain. What changed:  reasons to take this   Janumet XR [phone] MG Tb24 Generic drug:  SitaGLIPtin-MetFORMIN HCl Take 1 tablet by  mouth at bedtime.   Lantus SoloStar 100 UNIT/ML Solostar Pen Generic drug:  Insulin Glargine Inject 30 Units into the skin at bedtime.   lisinopril 20 MG tablet Commonly known as:  PRINIVIL,ZESTRIL Take 20 mg by mouth daily.   simvastatin 20 MG tablet Commonly known as:  ZOCOR Take 20 mg by mouth every evening.      Follow-up Information    Altamease Oiler, FNP Follow up in 2 week(s).   Specialty:  Family Medicine Contact information: [address] [place] [address] [phone]          Allergies  Allergen Reactions  . Augmentin [Amoxicillin-Pot Clavulanate] Nausea And Vomiting  . Iodine Hives  . Tape Rash    Paper     Consultations:  Wound care   Procedures/Studies:  No results found.  Discharge Exam: Vitals:   02/26/19 2100 02/27/19 0516  BP: 124/73 (!) 146/94  Pulse: 84 81  Resp: 20 18  Temp: 98.1 F (36.7 C) 97.8 F (36.6 C)  SpO2: 98% 97%   Vitals:   02/26/19 1430 02/26/19 2023 02/26/19 2100 02/27/19 0516  BP: (!) 163/95  124/73 (!) 146/94  Pulse: 87  84 81  Resp: 18  20 18   Temp: 98.5 F (36.9 C)  98.1 F (36.7 C) 97.8 F (36.6 C)  TempSrc:   Oral Oral  SpO2: 97% 97% 98% 97%  Weight:      Height:        General: Pt is alert, awake, not in acute distress Cardiovascular: RRR, S1/S2 +, no rubs, no gallops Respiratory: CTA bilaterally, no wheezing, no rhonchi Abdominal: Soft, NT, ND, bowel sounds + Extremities: no edema, no cyanosis, left lower extremity erythema improved with minimal tenderness.  Dressings are clean dry and intact.    The results of significant diagnostics from this hospitalization (including imaging, microbiology, ancillary and laboratory) are listed below for reference.     Microbiology: Recent Results (from the past 240 hour(s))  Blood Culture (routine x 2)     Status: None (Preliminary result)   Collection Time: 02/24/19  9:06 PM  Result Value Ref Range Status   Specimen Description BLOOD LEFT ANTECUBITAL  Final   Special Requests   Final    BOTTLES DRAWN AEROBIC AND ANAEROBIC Blood Culture adequate volume   Culture   Final    NO GROWTH 3 DAYS Performed at [facility], [address] [place], [place] [address]    Report Status PENDING  Incomplete  Blood Culture (routine x 2)     Status: None (Preliminary result)   Collection Time: 02/24/19  9:06 PM  Result Value Ref Range Status   Specimen Description BLOOD RIGHT HAND  Final   Special Requests  AEROBIC BOTTLE ONLY Blood Culture adequate volume  Final   Culture   Final    NO GROWTH 3 DAYS Performed at [facility], [address], [place], [place] [address]    Report Status PENDING  Incomplete     Labs: BNP (last 3 results) No results for input(s): BNP in the last 8760 hours. Basic Metabolic Panel: Recent Labs  Lab 02/24/19 1954 02/25/19 0441 02/27/19 0442  NA 133* 135 132*  K 4.9 3.8 4.3  CL 100 103 99  CO2 25 23 24   GLUCOSE 341* 261* 240*  BUN 14 12 17   CREATININE 1.03 0.76 0.92  CALCIUM 9.0 8.6* 8.8*   Liver Function Tests: Recent Labs  Lab 02/24/19 1954 02/25/19 0441  AST 19  16  ALT 22 19  ALKPHOS 87 67  BILITOT 0.7 0.8  PROT 6.7 6.6  ALBUMIN 3.7 3.5   No results for input(s): LIPASE, AMYLASE in the last 168 hours. No results for input(s): AMMONIA in the last 168 hours. CBC: Recent Labs  Lab 02/24/19 1954 02/25/19 0441 02/26/19 0450 02/27/19 0442  WBC 10.6* 12.8* 11.4* 12.2*  NEUTROABS 7.2  --   --   --   HGB 13.5 12.4* 12.5* 13.0  HCT 42.6 39.8 40.6 41.3  MCV 91.0 90.7 91.4 90.6  PLT 312 315 311 332   Cardiac Enzymes: No results for input(s): CKTOTAL, CKMB, CKMBINDEX, TROPONINI in the last 168 hours. BNP: Invalid input(s): POCBNP CBG: Recent Labs  Lab 02/26/19 0731 02/26/19 1114 02/26/19 1606 02/26/19 2149 02/27/19 0802  GLUCAP 205* 342* 253* 195* 198*   D-Dimer No results for input(s): DDIMER in the last 72 hours. Hgb A1c Recent Labs    02/24/19 1954  HGBA1C 9.0*   Lipid Profile No results for input(s): CHOL, HDL, LDLCALC, TRIG, CHOLHDL, LDLDIRECT in the last 72 hours. Thyroid function studies No results for input(s): TSH, T4TOTAL, T3FREE, THYROIDAB in the last 72 hours.  Invalid input(s): FREET3 Anemia work up No results for input(s): VITAMINB12, FOLATE, FERRITIN, TIBC, IRON, RETICCTPCT in the last 72 hours. Urinalysis    Component Value Date/Time   COLORURINE YELLOW 01/29/2016 1436   APPEARANCEUR CLEAR 01/29/2016  1436   LABSPEC 1.015 01/29/2016 1436   PHURINE 5.5 01/29/2016 1436   GLUCOSEU >1000 (A) 01/29/2016 1436   HGBUR NEGATIVE 01/29/2016 1436   BILIRUBINUR NEGATIVE 01/29/2016 1436   KETONESUR TRACE (A) 01/29/2016 1436   PROTEINUR NEGATIVE 01/29/2016 1436   NITRITE NEGATIVE 01/29/2016 1436   LEUKOCYTESUR NEGATIVE 01/29/2016 1436   Sepsis Labs Invalid input(s): PROCALCITONIN,  WBC,  LACTICIDVEN Microbiology Recent Results (from the past 240 hour(s))  Blood Culture (routine x 2)     Status: None (Preliminary result)   Collection Time: 02/24/19  9:06 PM  Result Value Ref Range Status   Specimen Description BLOOD LEFT ANTECUBITAL  Final   Special Requests   Final    BOTTLES DRAWN AEROBIC AND ANAEROBIC Blood Culture adequate volume   Culture   Final    NO GROWTH 3 DAYS Performed at [facility], [address], [place], [place] [address]    Report Status PENDING  Incomplete  Blood Culture (routine x 2)     Status: None (Preliminary result)   Collection Time: 02/24/19  9:06 PM  Result Value Ref Range Status   Specimen Description BLOOD RIGHT HAND  Final   Special Requests AEROBIC BOTTLE ONLY Blood Culture adequate volume  Final   Culture   Final    NO GROWTH 3 DAYS Performed at [facility], [address] [place], [place] [address]    Report Status PENDING  Incomplete     Time coordinating discharge: 35 minutes  SIGNED:   Erick Blinks, DO [facility] 02/27/2019, 9:25 AM  If 7PM-7AM, please contact night-coverage [email or web address] Password [email or web address]

## 2019-02-27 NOTE — Progress Notes (Signed)
Removed IV-clean, dry, intact. Reviewed d/c paperwork with patient and family. Reviewed new medications. Answered all questions. Wheeled stable patient to short stay entrance where he was picked up by sister-in-law. To home

## 2019-03-02 LAB — CULTURE, BLOOD (ROUTINE X 2)
Culture: NO GROWTH
Culture: NO GROWTH
SPECIAL REQUESTS: ADEQUATE
Special Requests: ADEQUATE

## 2019-04-23 ENCOUNTER — Other Ambulatory Visit: Payer: Self-pay

## 2019-04-23 ENCOUNTER — Encounter: Payer: Self-pay | Admitting: Orthopaedic Surgery

## 2019-04-23 ENCOUNTER — Ambulatory Visit (INDEPENDENT_AMBULATORY_CARE_PROVIDER_SITE_OTHER): Payer: Medicare Other | Admitting: Orthopaedic Surgery

## 2019-04-23 VITALS — BP 147/91 | HR 99 | Temp 95.9°F | Ht 65.0 in | Wt 200.0 lb

## 2019-04-23 DIAGNOSIS — Q780 Osteogenesis imperfecta: Secondary | ICD-10-CM

## 2019-04-23 DIAGNOSIS — M25511 Pain in right shoulder: Secondary | ICD-10-CM | POA: Diagnosis not present

## 2019-04-23 DIAGNOSIS — G14 Postpolio syndrome: Secondary | ICD-10-CM

## 2019-04-23 NOTE — Progress Notes (Signed)
PROCEDURE NOTE:  The patient request injection, verbal consent was obtained.  The right shoulder was prepped appropriately after time out was performed.   Sterile technique was observed and injection of 1 cc of Depo-Medrol 40 mg with several cc's of plain xylocaine. Anesthesia was provided by ethyl chloride and a 20-gauge needle was used to inject the shoulder area. A posterior approach was used.  The injection was tolerated well.  A band aid dressing was applied.  The patient was advised to apply ice later today and tomorrow to the injection sight as needed.  His wife died a few days ago.  I will see him as needed.  Call if any problem.  Precautions discussed.   Electronically Signed Darreld Mclean, MD 5/7/20208:40 AM

## 2019-06-04 ENCOUNTER — Encounter: Payer: Self-pay | Admitting: Orthopaedic Surgery

## 2019-06-04 ENCOUNTER — Ambulatory Visit: Payer: Medicare Other | Admitting: Orthopaedic Surgery

## 2019-06-04 ENCOUNTER — Other Ambulatory Visit: Payer: Self-pay

## 2019-06-04 ENCOUNTER — Telehealth: Payer: Self-pay | Admitting: Orthopaedic Surgery

## 2019-06-04 VITALS — BP 153/80 | HR 103 | Temp 98.2°F | Ht 65.0 in | Wt 200.0 lb

## 2019-06-04 DIAGNOSIS — G14 Postpolio syndrome: Secondary | ICD-10-CM | POA: Diagnosis not present

## 2019-06-04 DIAGNOSIS — Q780 Osteogenesis imperfecta: Secondary | ICD-10-CM | POA: Diagnosis not present

## 2019-06-04 DIAGNOSIS — M25511 Pain in right shoulder: Secondary | ICD-10-CM | POA: Diagnosis not present

## 2019-06-04 MED ORDER — PREDNISONE 5 MG (21) PO TBPK: ORAL_TABLET | ORAL | 0 refills | Status: DC

## 2019-06-04 NOTE — Progress Notes (Signed)
Patient Kurt [name], male DOB:May 11, 1951, 68 y.o. TKP:[id number]  Chief Complaint  Patient presents with  . Shoulder Pain    right/ injection did not help much     HPI  Kurt [name] is a 68 y.o. male who has continued pain of the right shoulder.  The injection last time did not help.  I will get a MRI of the shoulder.  I will give prednisone dose pack.   Body mass index is 33.28 kg/m.  ROS  Review of Systems  Constitutional: Positive for activity change.  Respiratory: Positive for shortness of breath. Negative for cough.   Musculoskeletal: Positive for arthralgias, gait problem and joint swelling.  All other systems reviewed and are negative.   All other systems reviewed and are negative.  The following is a summary of the past history medically, past history surgically, known current medicines, social history and family history.  This information is gathered electronically by the computer from prior information and documentation.  I review this each visit and have found including this information at this point in the chart is beneficial and informative.    Past Medical History:  Diagnosis Date  . Bronchitis   . Diabetes mellitus without complication ([place])   . Hypertension   . Osteogenesis imperfecta   . Post-polio syndrome     Past Surgical History:  Procedure Laterality Date  . ANKLE FRACTURE SURGERY Bilateral   . arm surgery    . EYE MUSCLE SURGERY    . LEG SURGERY Left     Family History  Problem Relation Age of Onset  . Hypertension Mother   . Diabetes Brother     Social History Social History   Tobacco Use  . Smoking status: Never Smoker  . Smokeless tobacco: Never Used  Substance Use Topics  . Alcohol use: No  . Drug use: No    Allergies  Allergen Reactions  . Augmentin [Amoxicillin-Pot Clavulanate] Nausea And Vomiting  . Iodine Hives  . Tape Rash    Paper     Current Outpatient Medications  Medication Sig Dispense Refill  .  acetaminophen (TYLENOL) 500 MG tablet Take 1,000 mg by mouth every 4 (four) hours as needed for mild pain or moderate pain.    Marland Kitchen glipiZIDE (GLUCOTROL XL) 10 MG 24 hr tablet Take 10 mg by mouth at bedtime.     Marland Kitchen HYDROcodone-acetaminophen (NORCO) 7.5-325 MG tablet Take 1 tablet by mouth every 6 (six) hours as needed for moderate pain or severe pain. 10 tablet 0  . LANTUS SOLOSTAR 100 UNIT/ML Solostar Pen Inject 30 Units into the skin at bedtime.     Marland Kitchen lisinopril (PRINIVIL,ZESTRIL) 20 MG tablet Take 20 mg by mouth daily.    Mable Fill (ALLERGY EYE OP) Apply 1 drop to eye every morning.    . predniSONE (STERAPRED UNI-PAK 21 TAB) 5 MG (21) TBPK tablet Take 6 pills first day; 5 pills second day; 4 pills third day; 3 pills fourth day; 2 pills next day and 1 pill last day. 21 tablet 0  . SANTYL ointment     . simvastatin (ZOCOR) 20 MG tablet Take 20 mg by mouth every evening.    . SitaGLIPtin-MetFORMIN HCl (JANUMET XR) [phone] MG TB24 Take 1 tablet by mouth at bedtime.     No current facility-administered medications for this visit.      Physical Exam  Blood pressure (!) 153/80, pulse (!) 103, temperature 98.2 F (36.8 C), height 5\' 5"  (1.651 m), weight 200  lb (90.7 kg).  Constitutional: overall normal hygiene, normal nutrition, well developed, normal grooming, normal body habitus. Assistive device:cane  Musculoskeletal: gait and station Limp left, muscle tone and strength are normal, no tremors or atrophy is present.  .  Neurological: coordination overall normal.  Deep tendon reflex/nerve stretch intact.  Sensation normal.  Cranial nerves II-XII intact.   Skin:   Normal overall no scars, lesions, ulcers or rashes. No psoriasis.  Psychiatric: Alert and oriented x 3.  Recent memory intact, remote memory unclear.  Normal mood and affect. Well groomed.  Good eye contact.  Cardiovascular: overall no swelling, no varicosities, no edema bilaterally, normal temperatures of the legs  and arms, no clubbing, cyanosis and good capillary refill.  Right shoulder with pain in extremes of motion, NV intact, Grips OK.  Lymphatic: palpation is normal.  All other systems reviewed and are negative   The patient has been educated about the nature of the problem(s) and counseled on treatment options.  The patient appeared to understand what I have discussed and is in agreement with it.  Encounter Diagnoses  Name Primary?  . Pain in joint of right shoulder Yes  . Post-polio syndrome   . Osteogenesis imperfecta     PLAN Call if any problems.  Precautions discussed.  Continue current medications.   Return to clinic after MRI of the right shoulder   Electronically Signed Darreld McleanWayne Dianah Pruett, MD 6/18/202011:00 AM

## 2019-06-04 NOTE — Telephone Encounter (Signed)
[name] from [facility] called and stated that Mr. Kurt [name] insurance will not cover him at their facility nor will it cover him at their local hospital. We will need to schedule him somewhere else

## 2019-06-05 NOTE — Telephone Encounter (Signed)
Order changed and pt notified. No questions at this time.

## 2019-06-05 NOTE — Addendum Note (Signed)
Addended by: Elizabeth Sauer on: 06/05/2019 11:58 AM   Modules accepted: Orders

## 2019-07-01 ENCOUNTER — Ambulatory Visit
Admission: RE | Admit: 2019-07-01 | Discharge: 2019-07-01 | Disposition: A | Discharge status: Home / Self Care | Payer: Medicare Other | Source: Ambulatory Visit | Attending: Orthopaedic Surgery | Admitting: Orthopaedic Surgery

## 2019-07-01 ENCOUNTER — Other Ambulatory Visit: Payer: Self-pay

## 2019-07-01 DIAGNOSIS — M25511 Pain in right shoulder: Secondary | ICD-10-CM

## 2019-07-21 ENCOUNTER — Ambulatory Visit (INDEPENDENT_AMBULATORY_CARE_PROVIDER_SITE_OTHER): Payer: Medicare Other | Admitting: Orthopaedic Surgery

## 2019-07-21 ENCOUNTER — Other Ambulatory Visit: Payer: Self-pay

## 2019-07-21 ENCOUNTER — Encounter: Payer: Self-pay | Admitting: Orthopaedic Surgery

## 2019-07-21 VITALS — BP 141/72 | HR 98 | Temp 97.4°F | Ht 65.0 in | Wt 200.0 lb

## 2019-07-21 DIAGNOSIS — M25511 Pain in right shoulder: Secondary | ICD-10-CM

## 2019-07-21 NOTE — Progress Notes (Signed)
PROCEDURE NOTE:  The patient request injection, verbal consent was obtained.  The right shoulder was prepped appropriately after time out was performed.   Sterile technique was observed and injection of 1 cc of Depo-Medrol 40 mg with several cc's of plain xylocaine. Anesthesia was provided by ethyl chloride and a 20-gauge needle was used to inject the shoulder area. A posterior approach was used.  The injection was tolerated well.  A band aid dressing was applied.  The patient was advised to apply ice later today and tomorrow to the injection sight as needed.  He had a MRI of the right shoulder.  It showed: IMPRESSION: 1. 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 2. Mild rotator cuff tendinosis without tear. 3. Biceps tendinosis and tenosynovitis with loose bodies in the biceps tendon sheath.  I have explained the findings to him.  He is tired of hurting.  I will have him evaluated by Dr. Marlou Sa as to possible surgery.  I will see as needed.  Electronically Signed Sanjuana Kava, MD 8/4/202011:38 AM

## 2019-07-27 ENCOUNTER — Encounter: Payer: Self-pay | Admitting: Orthopedic Surgery

## 2019-07-27 ENCOUNTER — Ambulatory Visit (INDEPENDENT_AMBULATORY_CARE_PROVIDER_SITE_OTHER): Payer: Medicare Other | Admitting: Orthopedic Surgery

## 2019-07-27 DIAGNOSIS — M19019 Primary osteoarthritis, unspecified shoulder: Secondary | ICD-10-CM

## 2019-07-27 NOTE — Progress Notes (Signed)
Office Visit Note   Patient: Kurt [name]           Date of Birth: 23-Nov-1951           MRN: [id number] Visit Date: 07/27/2019 Requested by: Kurt [name], Wayne, MD [address] Kurt [name],  [place] [id number] PCP: Altamease OilerHarris, Meredith L, FNP  Subjective: Chief Complaint  Patient presents with  . Right Shoulder - Pain  . Constipation    HPI: Kurt [name] is a patient with right shoulder pain.  He has known arthritis.  Had MRI scan which shows intact rotator cuff but severe end-stage right shoulder arthritis.  Patient also has diabetes with hemoglobin A1c approximately 9.  He also has osteogenesis imperfecta.  Has had multiple lower extremity fractures but no upper extremity fractures on the right.  He is on disability and lives alone but his brother-in-law's next-door.  Patient states that 20 years ago he "chipped the socket" on the right shoulder but did well after that.  He does use a walker but is not necessarily weightbearing through the walker.  Does have a history of polio affecting that right arm.  Pain is his biggest complaint now as opposed to functional loss.              ROS: All systems reviewed are negative as they relate to the chief complaint within the history of present illness.  Patient denies  fevers or chills.   Assessment & Plan: Visit Diagnoses:  1. Shoulder arthritis     Plan: Impression is right shoulder end-stage arthritis and a 68 year old patient with diabetes with hemoglobin A1c approximately 9 as well as osteogenesis imperfecta.  I think that Kurt [name] has high risk for complication with reverse shoulder replacement.  That would include but not limited to infection nerve vessel damage potential for fracture of both the glenoid and humeral side based on bone quality.  Nonetheless this is something that he cannot live with at this time.  It is waking him from sleep on a nightly basis.  He has had injections the last 1 07/21/2019 which gave him temporary relief.  I would want to  wait at least 2 to 3 months before considering shoulder replacement after that last cortisone shot.  In the meantime we will get preoperative CT scan and have him work on getting his hemoglobin A1c down below 8.  We will see him back in about 8 weeks possible schedule at that time.  Follow-Up Instructions: Return in about 8 weeks (around 09/21/2019).   Orders:  Orders Placed This Encounter  Procedures  . CT SHOULDER RIGHT WO CONTRAST   No orders of the defined types were placed in this encounter.     Procedures: No procedures performed   Clinical Data: No additional findings.  Objective: Vital Signs: There were no vitals taken for this visit.  Physical Exam:   Constitutional: Patient appears well-developed HEENT:  Head: Normocephalic Eyes:EOM are normal Neck: Normal range of motion Cardiovascular: Normal rate Pulmonary/chest: Effort normal Neurologic: Patient is alert Skin: Skin is warm Psychiatric: Patient has normal mood and affect    Ortho Exam: Ortho exam demonstrates good rotator cuff strength on the right infraspinatus supraspinatus and subscap muscle testing.  Motor sensory function of the hand is intact.  He does have some finger deformity consistent with his known diagnosis of polio.  He is got forward flexion of about 90 and isolated glenohumeral abduction about 90 on the right-hand side.  On the left is essentially full.  He does have good rotator cuff strength.  No masses lymphadenopathy or skin changes noted in that shoulder girdle region.  Specialty Comments:  No specialty comments available.  Imaging: No results found.   PMFS History: Patient Active Problem List   Diagnosis Date Noted  . Cellulitis 02/24/2019  . 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 Osteogenesis imperfecta 12/25/2018  . Diabetes mellitus without complication ([place]) 82/64/1583   Past Medical History:  Diagnosis Date  . Bronchitis   . Diabetes mellitus without complication ([place])   .  Hypertension   . Osteogenesis imperfecta   . Post-polio syndrome     Family History  Problem Relation Age of Onset  . Hypertension Mother   . Diabetes Brother     Past Surgical History:  Procedure Laterality Date  . ANKLE FRACTURE SURGERY Bilateral   . arm surgery    . EYE MUSCLE SURGERY    . LEG SURGERY Left    Social History   Occupational History  . Not on file  Tobacco Use  . Smoking status: Never Smoker  . Smokeless tobacco: Never Used  Substance and Sexual Activity  . Alcohol use: No  . Drug use: No  . Sexual activity: Not on file

## 2019-08-05 ENCOUNTER — Ambulatory Visit
Admission: RE | Admit: 2019-08-05 | Discharge: 2019-08-05 | Disposition: A | Discharge status: Home / Self Care | Payer: Medicare Other | Source: Ambulatory Visit | Attending: Orthopedic Surgery | Admitting: Orthopedic Surgery

## 2019-08-05 ENCOUNTER — Other Ambulatory Visit: Payer: Self-pay

## 2019-08-05 DIAGNOSIS — M19019 Primary osteoarthritis, unspecified shoulder: Secondary | ICD-10-CM

## 2019-08-28 ENCOUNTER — Telehealth: Payer: Self-pay | Admitting: Orthopedic Surgery

## 2019-08-28 NOTE — Telephone Encounter (Signed)
Patient called. He would like the results of his CT scan. His call back number is [phone]. Thanks

## 2019-08-28 NOTE — Telephone Encounter (Signed)
Please advise. Thanks.  

## 2019-09-01 NOTE — Telephone Encounter (Signed)
Please have Debbie call him to schedule surgery for his shoulder thanks I called him.

## 2019-09-02 NOTE — Telephone Encounter (Signed)
See note from Dr Dean.  

## 2019-09-03 ENCOUNTER — Telehealth: Payer: Self-pay | Admitting: Orthopedic Surgery

## 2019-09-03 NOTE — Telephone Encounter (Signed)
Left message on patient's voicemail providing name and direct number for scheduling surgery.

## 2019-09-09 ENCOUNTER — Other Ambulatory Visit: Payer: Self-pay

## 2019-11-05 NOTE — Progress Notes (Signed)
[employer] - [place], [place] - [address] [place] [address] Phone: [phone] Fax: [phone]  [id number] - [place], [place] - [address] AT [facility] [address] [place] [address] Phone: [phone] Fax: [phone]      Your procedure is scheduled on Tuesday, November 10, 2019.   Report to [facility] Main Entrance "A" at 5:30 A.M., and check in at the Admitting office.   Call this number if you have problems the morning of surgery:  [phone]  Call [phone] if you have any questions prior to your surgery date Monday-Friday 8am-4pm    Remember:  Do not eat after midnight the night before your surgery  You may drink clear liquids until 4:30AM the morning of your surgery.   Clear liquids allowed are: Water, Non-Citrus Juices (without pulp), Carbonated Beverages, Clear Tea, Black Coffee Only, and Gatorade    Take these medicines the morning of surgery with A SIP OF WATER :  Acetaminophen (Tylenol) - if needed   WHAT DO I DO ABOUT MY DIABETES MEDICATION?   Marland Kitchen Do not take oral diabetes medicines (pills) the morning of surgery - DO NOT TAKE METFORMIN OR JANUMET the morning of surgery.  . THE NIGHT BEFORE SURGERY, take only 16 units of Lantus insulin.       HOW TO MANAGE YOUR DIABETES BEFORE AND AFTER SURGERY  Why is it important to control my blood sugar before and after surgery? . Improving blood sugar levels before and after surgery helps healing and can limit problems. . A way of improving blood sugar control is eating a healthy diet by: o  Eating less sugar and carbohydrates o  Increasing activity/exercise o  Talking with your doctor about reaching your blood sugar goals . High blood sugars (greater than 180 mg/dL) can raise your risk of infections and slow your recovery, so you will need to focus on controlling your diabetes during the weeks before  surgery. . Make sure that the doctor who takes care of your diabetes knows about your planned surgery including the date and location.  How do I manage my blood sugar before surgery? . Check your blood sugar at least 4 times a day, starting 2 days before surgery, to make sure that the level is not too high or low. . Check your blood sugar the morning of your surgery when you wake up and every 2 hours until you get to the Short Stay unit. o If your blood sugar is less than 70 mg/dL, you will need to treat for low blood sugar: - Do not take insulin. - Treat a low blood sugar (less than 70 mg/dL) with  cup of clear juice (cranberry or apple), 4 glucose tablets, OR glucose gel. - Recheck blood sugar in 15 minutes after treatment (to make sure it is greater than 70 mg/dL). If your blood sugar is not greater than 70 mg/dL on recheck, call [phone] for further instructions. . Report your blood sugar to the short stay nurse when you get to Short Stay.  . If you are admitted to the hospital after surgery: o Your blood sugar will be checked by the staff and you will probably be given insulin after surgery (instead of oral diabetes medicines) to make sure you have good blood sugar levels. o The goal for blood sugar control after surgery is 80-180 mg/dL. o  7 days prior to  surgery STOP taking any Aspirin (unless otherwise instructed by your surgeon), Aleve, Naproxen, Ibuprofen, Motrin, Advil, Goody's, BC's, all herbal medications, fish oil, and all vitamins.    The Morning of Surgery  Do not wear jewelry.  Do not wear lotions, powders,colognes, or deodorant  Do not shave 48 hours prior to surgery.  Men may shave face and neck.  Do not bring valuables to the hospital.  [facility] any belongings or valuables.  If you are a smoker, DO NOT Smoke 24 hours prior to surgery  If you wear a CPAP at night please bring your mask, tubing, and machine the morning of surgery    Remember that you must have someone to transport you home after your surgery, and remain with you for 24 hours if you are discharged the same day.   Please bring cases for contacts, glasses, hearing aids, dentures or bridgework because it cannot be worn into surgery.    Leave your suitcase in the car.  After surgery it may be brought to your room.  For patients admitted to the hospital, discharge time will be determined by your treatment team.  Patients discharged the day of surgery will not be allowed to drive home.    Special instructions:   [facility]- Preparing For Surgery  Before surgery, you can play an important role. Because skin is not sterile, your skin needs to be as free of germs as possible. You can reduce the number of germs on your skin by washing with CHG (chlorahexidine gluconate) Soap before surgery.  CHG is an antiseptic cleaner which kills germs and bonds with the skin to continue killing germs even after washing.    Oral Hygiene is also important to reduce your risk of infection.  Remember - BRUSH YOUR TEETH THE MORNING OF SURGERY WITH YOUR REGULAR TOOTHPASTE  Please do not use if you have an allergy to CHG or antibacterial soaps. If your skin becomes reddened/irritated stop using the CHG.  Do not shave (including legs and underarms) for at least 48 hours prior to first CHG shower. It is OK to shave your face.  Please follow these instructions carefully.   1. Shower the NIGHT BEFORE SURGERY and the MORNING OF SURGERY with CHG Soap.   2. If you chose to wash your hair, wash your hair first as usual with your normal shampoo.  3. After you shampoo, rinse your hair and body thoroughly to remove the shampoo.  4. Use CHG as you would any other liquid soap. You can apply CHG directly to the skin and wash gently with a scrungie or a clean washcloth.   5. Apply the CHG Soap to your body ONLY FROM THE NECK DOWN.  Do not use on open wounds or open sores. Avoid contact  with your eyes, ears, mouth and genitals (private parts). Wash Face and genitals (private parts)  with your normal soap.   6. Wash thoroughly, paying special attention to the area where your surgery will be performed.  7. Thoroughly rinse your body with warm water from the neck down.  8. DO NOT shower/wash with your normal soap after using and rinsing off the CHG Soap.  9. Pat yourself dry with a CLEAN TOWEL.  10. Wear CLEAN PAJAMAS to bed the night before surgery, wear comfortable clothes the morning of surgery  11. Place CLEAN SHEETS on your bed the night of your first shower and DO NOT SLEEP WITH PETS.    Day of  Surgery:  Please shower the morning of surgery with the CHG soap Do not apply any deodorants/lotions. Please wear clean clothes to the hospital/surgery center.   Remember to brush your teeth WITH YOUR REGULAR TOOTHPASTE.   Please read over the following fact sheets that you were given.

## 2019-11-06 ENCOUNTER — Other Ambulatory Visit (HOSPITAL_COMMUNITY)
Admission: RE | Admit: 2019-11-06 | Discharge: 2019-11-06 | Disposition: A | Discharge status: Home / Self Care | Payer: Medicare Other | Source: Ambulatory Visit | Attending: Orthopedic Surgery | Admitting: Orthopedic Surgery

## 2019-11-06 ENCOUNTER — Encounter (HOSPITAL_COMMUNITY): Payer: Self-pay

## 2019-11-06 ENCOUNTER — Encounter (HOSPITAL_COMMUNITY)
Admission: RE | Admit: 2019-11-06 | Discharge: 2019-11-06 | Disposition: A | Discharge status: Home / Self Care | Payer: Medicare Other | Source: Ambulatory Visit | Attending: Orthopedic Surgery | Admitting: Orthopedic Surgery

## 2019-11-06 ENCOUNTER — Other Ambulatory Visit: Payer: Self-pay

## 2019-11-06 DIAGNOSIS — Z794 Long term (current) use of insulin: Secondary | ICD-10-CM | POA: Insufficient documentation

## 2019-11-06 DIAGNOSIS — I10 Essential (primary) hypertension: Secondary | ICD-10-CM | POA: Insufficient documentation

## 2019-11-06 DIAGNOSIS — Z01818 Encounter for other preprocedural examination: Secondary | ICD-10-CM | POA: Insufficient documentation

## 2019-11-06 DIAGNOSIS — M19011 Primary osteoarthritis, right shoulder: Secondary | ICD-10-CM | POA: Insufficient documentation

## 2019-11-06 DIAGNOSIS — Z20828 Contact with and (suspected) exposure to other viral communicable diseases: Secondary | ICD-10-CM | POA: Diagnosis not present

## 2019-11-06 DIAGNOSIS — E119 Type 2 diabetes mellitus without complications: Secondary | ICD-10-CM | POA: Insufficient documentation

## 2019-11-06 DIAGNOSIS — K219 Gastro-esophageal reflux disease without esophagitis: Secondary | ICD-10-CM | POA: Diagnosis not present

## 2019-11-06 DIAGNOSIS — Z87442 Personal history of urinary calculi: Secondary | ICD-10-CM | POA: Insufficient documentation

## 2019-11-06 DIAGNOSIS — G14 Postpolio syndrome: Secondary | ICD-10-CM | POA: Diagnosis not present

## 2019-11-06 DIAGNOSIS — Z79899 Other long term (current) drug therapy: Secondary | ICD-10-CM | POA: Insufficient documentation

## 2019-11-06 DIAGNOSIS — Q780 Osteogenesis imperfecta: Secondary | ICD-10-CM | POA: Diagnosis not present

## 2019-11-06 DIAGNOSIS — Z6833 Body mass index (BMI) 33.0-33.9, adult: Secondary | ICD-10-CM | POA: Insufficient documentation

## 2019-11-06 DIAGNOSIS — E669 Obesity, unspecified: Secondary | ICD-10-CM | POA: Insufficient documentation

## 2019-11-06 HISTORY — DX: Concussion with loss of consciousness of unspecified duration, initial encounter: S06.0X9A

## 2019-11-06 HISTORY — DX: Concussion with loss of consciousness status unknown, initial encounter: S06.0XAA

## 2019-11-06 HISTORY — DX: Other specified postprocedural states: Z98.890

## 2019-11-06 HISTORY — DX: Unspecified osteoarthritis, unspecified site: M19.90

## 2019-11-06 HISTORY — DX: Personal history of urinary calculi: Z87.442

## 2019-11-06 HISTORY — DX: Gastro-esophageal reflux disease without esophagitis: K21.9

## 2019-11-06 HISTORY — DX: Nausea with vomiting, unspecified: R11.2

## 2019-11-06 LAB — BASIC METABOLIC PANEL
Anion gap: 12 (ref 5–15)
BUN: 12 mg/dL (ref 8–23)
CO2: 23 mmol/L (ref 22–32)
Calcium: 9.1 mg/dL (ref 8.9–10.3)
Chloride: 103 mmol/L (ref 98–111)
Creatinine, Ser: 0.88 mg/dL (ref 0.61–1.24)
GFR calc Af Amer: >60 mL/min (ref >60)
GFR calc non Af Amer: >60 mL/min (ref >60)
Glucose, Bld: 112 mg/dL — ABNORMAL HIGH (ref 70–99)
Potassium: 4.5 mmol/L (ref 3.5–5.1)
Sodium: 138 mmol/L (ref 135–145)

## 2019-11-06 LAB — CBC
HCT: 46.3 % (ref 39.0–52.0)
Hemoglobin: 14.8 g/dL (ref 13.0–17.0)
MCH: 29.6 pg (ref 26.0–34.0)
MCHC: 32 g/dL (ref 30.0–36.0)
MCV: 92.6 fL (ref 80.0–100.0)
Platelets: 303 10*3/uL (ref 150–400)
RBC: 5 MIL/uL (ref 4.22–5.81)
RDW: 13.6 % (ref 11.5–15.5)
WBC: 11.3 10*3/uL — ABNORMAL HIGH (ref 4.0–10.5)
nRBC: 0 % (ref 0.0–0.2)

## 2019-11-06 LAB — HEMOGLOBIN A1C
Hgb A1c MFr Bld: 6.6 % — ABNORMAL HIGH (ref 4.8–5.6)
Mean Plasma Glucose: 142.72 mg/dL

## 2019-11-06 LAB — URINALYSIS, ROUTINE W REFLEX MICROSCOPIC
Bilirubin Urine: NEGATIVE
Glucose, UA: NEGATIVE mg/dL
Hgb urine dipstick: NEGATIVE
Ketones, ur: NEGATIVE mg/dL
Leukocytes,Ua: NEGATIVE
Nitrite: NEGATIVE
Protein, ur: NEGATIVE mg/dL
Specific Gravity, Urine: 1.019 (ref 1.005–1.030)
pH: 5 (ref 5.0–8.0)

## 2019-11-06 LAB — SURGICAL PCR SCREEN
MRSA, PCR: NEGATIVE
Staphylococcus aureus: NEGATIVE

## 2019-11-06 LAB — GLUCOSE, CAPILLARY: Glucose-Capillary: 114 mg/dL — ABNORMAL HIGH (ref 70–99)

## 2019-11-06 NOTE — Progress Notes (Signed)
PCP - Burman Freestone, NP Cardiologist - denies  PPM/ICD - N/A Device Orders -  Rep Notified -   Chest x-ray - N/A EKG - today Stress Test - denies ECHO - denies Cardiac Cath - denies  Sleep Study - N/A CPAP -   Fasting Blood Sugar  97-130 Checks Blood Sugar ___3-4__ times a day  Blood Thinner Instructions: NA Aspirin Instructions: N/A  ERAS Protcol - Yes  PRE-SURGERY Ensure or G2- G2  COVID TEST- to be done today    Anesthesia review: yes, hx of post polio syndrome and Osteogenesis imperfecta  Patient denies shortness of breath, fever, cough and chest pain at PAT appointment   All instructions explained to the patient, with a verbal understanding of the material. Patient agrees to go over the instructions while at home for a better understanding. Patient also instructed to self quarantine after being tested for COVID-19. The opportunity to ask questions was provided.  Pt informed of Visitation policy and voiced understanding.

## 2019-11-06 NOTE — Pre-Procedure Instructions (Signed)
Kurt [name]  11/06/2019    Your procedure is scheduled on Tuesday, November 10, 2019 at 7:15 AM.   Report to [facility] Entrance "A" Admitting Office at 5:30 AM.   Call this number if you have problems the morning of surgery: [phone]   Questions prior to day of surgery, please call [phone] between 8 & 4 PM.   Remember:  Do not eat food after midnight Monday, 11/09/19.  You may drink clear liquids until 4:15 AM.  Clear liquids allowed are: Water, Juice (non-citric and without pulp), Carbonated beverages, Clear Tea, Black Coffee only, Plain Jell-O only, Gatorade and Plain Popsicles only   Drink Gatorade G2 by 4:15 AM (do not sip it).  Do not take your Glipizide Monday evening. Take 1/2 of your regular dose of Lantus Insulin Monday evening (take 16 units).   Take these medicines the morning of surgery with A SIP OF WATER: Tylenol - if needed   How to Manage Your Diabetes Before Surgery   Why is it important to control my blood sugar before and after surgery?   Improving blood sugar levels before and after surgery helps healing and can limit problems.  A way of improving blood sugar control is eating a healthy diet by:  - Eating less sugar and carbohydrates  - Increasing activity/exercise  - Talk with your doctor about reaching your blood sugar goals  High blood sugars (greater than 180 mg/dL) can raise your risk of infections and slow down your recovery so you will need to focus on controlling your diabetes during the weeks before surgery.  Make sure that the doctor who takes care of your diabetes knows about your planned surgery including the date and location.  How do I manage my blood sugars before surgery?   Check your blood sugar at least 4 times a day, 2 days before surgery to make sure that they are not too high or low.  Check your blood sugar the morning of your surgery when you wake up and every 2 hours until you get to the Short-Stay  unit.  Treat a low blood sugar (less than 70 mg/dL) with 1/2 cup of clear juice (cranberry or apple), 4 glucose tablets, OR glucose gel.  Recheck blood sugar in 15 minutes after treatment (to make sure it is greater than 70 mg/dL).  If blood sugar is not greater than 70 mg/dL on re-check, call [phone] for further instructions.   Report your blood sugar to the Short-Stay nurse when you get to Short-Stay.  References:  [facility], 2007 "How to Manage your Diabetes Before and After Surgery".    Do not wear jewelry.  Do not wear lotions, powders, cologne or deodorant.  Men may shave face and neck.  Do not bring valuables to the hospital.  [facility] is not responsible for any belongings or valuables.  Contacts, dentures or bridgework may not be worn into surgery.  Leave your suitcase in the car.  After surgery it may be brought to your room.  For patients admitted to the hospital, discharge time will be determined by your treatment team.  [facility] - Preparing for Surgery  Before surgery, you can play an important role.  Because skin is not sterile, your skin needs to be as free of germs as possible.  You can reduce the number of germs on you skin by washing with CHG (chlorahexidine gluconate) soap before surgery.  CHG is an antiseptic cleaner which kills  germs and bonds with the skin to continue killing germs even after washing.  Oral Hygiene is also important in reducing the risk of infection.  Remember to brush your teeth with your regular toothpaste the morning of surgery.  Please DO NOT use if you have an allergy to CHG or antibacterial soaps.  If your skin becomes reddened/irritated stop using the CHG and inform your nurse when you arrive at Short Stay.  Do not shave (including legs and underarms) for at least 48 hours prior to the first CHG shower.  You may shave your face.  Please follow these instructions carefully:   1.  Shower with CHG Soap  the night before surgery and the morning of Surgery.  2.  If you choose to wash your hair, wash your hair first as usual with your normal shampoo.  3.  After you shampoo, rinse your hair and body thoroughly to remove the shampoo. 4.  Use CHG as you would any other liquid soap.  You can apply chg directly to the skin and wash gently with a      scrungie or washcloth.           5.  Apply the CHG Soap to your body ONLY FROM THE NECK DOWN.   Do not use on open wounds or open sores. Avoid contact with your eyes, ears, mouth and genitals (private parts).  Wash genitals (private parts) with your normal soap - do this prior to using CHG soap.  6.  Wash thoroughly, paying special attention to the area where your surgery will be performed.  7.  Thoroughly rinse your body with warm water from the neck down.  8.  DO NOT shower/wash with your normal soap after using and rinsing off the CHG Soap.  9.  Pat yourself dry with a clean towel.            10.  Wear clean pajamas.            11.  Place clean sheets on your bed the night of your first shower and do not sleep with pets.  Day of Surgery  Shower as above. Do not apply any lotions/deodorants the morning of surgery.   Please wear clean clothes to the hospital. Remember to brush your teeth with toothpaste.  Please read over the fact sheets that you were given.

## 2019-11-07 LAB — URINE CULTURE: Culture: NO GROWTH

## 2019-11-08 LAB — NOVEL CORONAVIRUS, NAA (HOSP ORDER, SEND-OUT TO REF LAB; TAT 18-24 HRS): SARS-CoV-2, NAA: NOT DETECTED

## 2019-11-09 NOTE — Anesthesia Preprocedure Evaluation (Addendum)
Anesthesia Evaluation  Patient identified by MRN, date of birth, ID band Patient awake    Reviewed: Allergy & Precautions, H&P , NPO status , Patient's Chart, lab work & pertinent test results  History of Anesthesia Complications (+) PONV and history of anesthetic complications  Airway Mallampati: II  TM Distance: >3 FB Neck ROM: Full    Dental no notable dental hx. (+) Teeth Intact, Dental Advisory Given, Poor Dentition, Chipped   Pulmonary neg pulmonary ROS,    Pulmonary exam normal breath sounds clear to auscultation       Cardiovascular Exercise Tolerance: Good hypertension, Pt. 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 ROS  negative psych ROS   GI/Hepatic negative GI ROS, Neg liver ROS, GERD  ,  Endo/Other  negative endocrine ROSdiabetes  Renal/GU negative Renal ROS  negative genitourinary   Musculoskeletal negative musculoskeletal ROS (+)   Abdominal   Peds negative pediatric ROS (+)  Hematology negative hematology ROS (+)   Anesthesia Other Findings Osteogenesis imperfecta  Reproductive/Obstetrics negative OB ROS                           Anesthesia Physical Anesthesia Plan  ASA: III  Anesthesia Plan: General   Post-op Pain Management: GA combined w/ Regional for post-op pain   Induction: Intravenous  PONV Risk Score and Plan: 2 and Ondansetron, Treatment may vary due to age or medical condition and Midazolam  Airway Management Planned: Oral ETT  Additional Equipment:   Intra-op Plan:   Post-operative Plan: Extubation in OR  Informed Consent: I have reviewed the patients History and Physical, chart, labs and discussed the procedure including the risks, benefits and alternatives for the proposed anesthesia with the patient or authorized representative who has  indicated his/her understanding and acceptance.     Dental advisory given  Plan Discussed with: Anesthesiologist, CRNA and Surgeon  Anesthesia Plan Comments: (PAT note written 11/09/2019 by Myra Gianotti, PA-C. )      Anesthesia Quick Evaluation

## 2019-11-09 NOTE — Progress Notes (Signed)
Anesthesia Chart Review:  Case: [id number] Date/Time: 11/10/19 0700   Procedure: RIGHT REVERSE SHOULDER ARTHROPLASTY (Right Shoulder)   Anesthesia type: General   Pre-op diagnosis: right shoulder osteoarthritis   Location: [facility] OR ROOM 06 / [employer] [facility] OR   Surgeon: Meredith Pel, MD      DISCUSSION: Patient is a 68 year old male scheduled for the above procedure.  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  BMI is consistent with obesity.  Labs and EKG appear acceptable for OR.  He denied shortness of breath, cough, fever, chest pain at PAT RN visit.  COVID-19 test negative 11/06/19.  If no acute changes and I would anticipate that he could proceed as planned.   VS: BP 140/77   Pulse 88   Temp 37.2 C (Temporal)   Resp 20   Ht 5\' 5"  (1.651 m)   Wt 91.4 kg   SpO2 96%   BMI 33.55 kg/m   PROVIDERS: Joyice Faster, FNP is PCP   LABS: Labs reviewed: Acceptable for surgery. (all labs ordered are listed, but only abnormal results are displayed)  Labs Reviewed  GLUCOSE, CAPILLARY - Abnormal; Notable for the following components:      Result Value   Glucose-Capillary 114 (*)    All other components within normal limits  BASIC METABOLIC PANEL - Abnormal; Notable for the following components:   Glucose, Bld 112 (*)    All other components within normal limits  CBC - Abnormal; Notable for the following components:   WBC 11.3 (*)    All other components within normal limits  HEMOGLOBIN A1C - Abnormal; Notable for the following components:   Hgb A1c MFr Bld 6.6 (*)    All other components within normal limits  URINE CULTURE  SURGICAL PCR SCREEN  URINALYSIS, ROUTINE W REFLEX MICROSCOPIC     IMAGES: CT right shoulder 08/05/19: IMPRESSION: Severe osteoarthritis of the glenohumeral joint.   EKG: 11/06/19: NSR   CV: N/A  Past Medical  History:  Diagnosis Date  . Arthritis   . Bronchitis   . Bronchitis   . Concussion    late 1990's  after a fall  . Diabetes mellitus without complication ([place])   . GERD (gastroesophageal reflux disease)   . History of kidney stones   . Hypertension   . Osteogenesis imperfecta   . PONV (postoperative nausea and vomiting)    pt has post polio syndrome  . Post-polio syndrome     Past Surgical History:  Procedure Laterality Date  . ANKLE FRACTURE SURGERY Bilateral   . arm surgery Right    nerve surgery  . COLONOSCOPY    . ELBOW FRACTURE SURGERY Left   . EYE MUSCLE SURGERY Left   . LEG SURGERY Left    femur fracture with rod  . TONSILLECTOMY      MEDICATIONS: . acetaminophen (TYLENOL) 650 MG CR tablet  . glipiZIDE (GLUCOTROL XL) 10 MG 24 hr tablet  . HYDROcodone-acetaminophen (NORCO) 7.5-325 MG tablet  . LANTUS SOLOSTAR 100 UNIT/ML Solostar Pen  . lisinopril (PRINIVIL,ZESTRIL) 20 MG tablet  . methocarbamol (ROBAXIN) 500 MG tablet  . simvastatin (ZOCOR) 20 MG tablet  . SitaGLIPtin-MetFORMIN HCl (JANUMET XR) [phone] MG TB24   No current facility-administered medications for this encounter.      Myra Gianotti, PA-C Surgical Short Stay/Anesthesiology [facility] Phone [phone] [facility] Phone [phone] 11/09/2019 10:37 AM

## 2019-11-10 ENCOUNTER — Inpatient Hospital Stay (HOSPITAL_COMMUNITY): Payer: Medicare Other

## 2019-11-10 ENCOUNTER — Encounter (HOSPITAL_COMMUNITY): Payer: Self-pay

## 2019-11-10 ENCOUNTER — Inpatient Hospital Stay (HOSPITAL_COMMUNITY)
Admission: RE | Admit: 2019-11-10 | Discharge: 2019-11-12 | DRG: 483 | Disposition: A | Discharge status: Home / Self Care | Payer: Medicare Other | Attending: Orthopedic Surgery | Admitting: Orthopedic Surgery

## 2019-11-10 ENCOUNTER — Inpatient Hospital Stay (HOSPITAL_COMMUNITY): Payer: Medicare Other | Admitting: Vascular Surgery

## 2019-11-10 ENCOUNTER — Other Ambulatory Visit: Payer: Self-pay

## 2019-11-10 ENCOUNTER — Encounter (HOSPITAL_COMMUNITY)
Admission: RE | Disposition: A | Discharge status: Home / Self Care | Payer: Self-pay | Source: Home / Self Care | Attending: Orthopedic Surgery

## 2019-11-10 ENCOUNTER — Inpatient Hospital Stay (HOSPITAL_COMMUNITY): Payer: Medicare Other | Admitting: Anesthesiology

## 2019-11-10 DIAGNOSIS — K219 Gastro-esophageal reflux disease without esophagitis: Secondary | ICD-10-CM | POA: Diagnosis present

## 2019-11-10 DIAGNOSIS — Z8249 Family history of ischemic heart disease and other diseases of the circulatory system: Secondary | ICD-10-CM | POA: Diagnosis not present

## 2019-11-10 DIAGNOSIS — Z87442 Personal history of urinary calculi: Secondary | ICD-10-CM

## 2019-11-10 DIAGNOSIS — Z20828 Contact with and (suspected) exposure to other viral communicable diseases: Secondary | ICD-10-CM | POA: Diagnosis present

## 2019-11-10 DIAGNOSIS — I10 Essential (primary) hypertension: Secondary | ICD-10-CM | POA: Diagnosis present

## 2019-11-10 DIAGNOSIS — Z79899 Other long term (current) drug therapy: Secondary | ICD-10-CM | POA: Diagnosis not present

## 2019-11-10 DIAGNOSIS — M19019 Primary osteoarthritis, unspecified shoulder: Secondary | ICD-10-CM | POA: Diagnosis present

## 2019-11-10 DIAGNOSIS — Z79891 Long term (current) use of opiate analgesic: Secondary | ICD-10-CM | POA: Diagnosis not present

## 2019-11-10 DIAGNOSIS — Z7984 Long term (current) use of oral hypoglycemic drugs: Secondary | ICD-10-CM

## 2019-11-10 DIAGNOSIS — Z833 Family history of diabetes mellitus: Secondary | ICD-10-CM

## 2019-11-10 DIAGNOSIS — Q780 Osteogenesis imperfecta: Secondary | ICD-10-CM

## 2019-11-10 DIAGNOSIS — E119 Type 2 diabetes mellitus without complications: Secondary | ICD-10-CM | POA: Diagnosis present

## 2019-11-10 DIAGNOSIS — M19011 Primary osteoarthritis, right shoulder: Principal | ICD-10-CM

## 2019-11-10 DIAGNOSIS — G14 Postpolio syndrome: Secondary | ICD-10-CM | POA: Diagnosis present

## 2019-11-10 DIAGNOSIS — Z9889 Other specified postprocedural states: Secondary | ICD-10-CM

## 2019-11-10 HISTORY — PX: REVERSE SHOULDER ARTHROPLASTY: SHX5054

## 2019-11-10 LAB — GLUCOSE, CAPILLARY
Glucose-Capillary: 103 mg/dL — ABNORMAL HIGH (ref 70–99)
Glucose-Capillary: 157 mg/dL — ABNORMAL HIGH (ref 70–99)
Glucose-Capillary: 287 mg/dL — ABNORMAL HIGH (ref 70–99)
Glucose-Capillary: 260 mg/dL — ABNORMAL HIGH (ref 70–99)

## 2019-11-10 SURGERY — ARTHROPLASTY, SHOULDER, TOTAL, REVERSE
Anesthesia: Regional | Site: Shoulder | Laterality: Right

## 2019-11-10 MED ORDER — OXYCODONE HCL 5 MG PO TABS
5.0000 mg | ORAL_TABLET | ORAL | Status: DC | PRN
  Administered 2019-11-11: 10 mg via ORAL
  Administered 2019-11-11: 5 mg via ORAL
  Administered 2019-11-11 – 2019-11-12 (×5): 10 mg via ORAL
  Filled 2019-11-10 (×4): qty 2
  Filled 2019-11-10: qty 1
  Filled 2019-11-10 (×2): qty 2

## 2019-11-10 MED ORDER — OXYCODONE HCL 5 MG PO TABS: 5.0000 mg | ORAL_TABLET | Freq: Once | ORAL | Status: DC | PRN

## 2019-11-10 MED ORDER — ACETAMINOPHEN 160 MG/5ML PO SOLN: 325.0000 mg | ORAL | Status: DC | PRN

## 2019-11-10 MED ORDER — ACETAMINOPHEN 325 MG PO TABS: 325.0000 mg | ORAL_TABLET | ORAL | Status: DC | PRN

## 2019-11-10 MED ORDER — METHOCARBAMOL 500 MG PO TABS
500.0000 mg | ORAL_TABLET | Freq: Four times a day (QID) | ORAL | Status: DC | PRN
  Administered 2019-11-11 – 2019-11-12 (×4): 500 mg via ORAL
  Filled 2019-11-10 (×4): qty 1

## 2019-11-10 MED ORDER — EPHEDRINE SULFATE 50 MG/ML IJ SOLN
INTRAMUSCULAR | Status: DC | PRN
  Administered 2019-11-10 (×3): 5 mg via INTRAVENOUS

## 2019-11-10 MED ORDER — FENTANYL CITRATE (PF) 250 MCG/5ML IJ SOLN
INTRAMUSCULAR | Status: AC
  Filled 2019-11-10: qty 5

## 2019-11-10 MED ORDER — INSULIN ASPART 100 UNIT/ML ~~LOC~~ SOLN: 0.0000 [IU] | Freq: Three times a day (TID) | SUBCUTANEOUS | Status: DC

## 2019-11-10 MED ORDER — METOCLOPRAMIDE HCL 5 MG PO TABS: 5.0000 mg | ORAL_TABLET | Freq: Three times a day (TID) | ORAL | Status: DC | PRN

## 2019-11-10 MED ORDER — TRANEXAMIC ACID-NACL 1000-0.7 MG/100ML-% IV SOLN
1000.0000 mg | INTRAVENOUS | Status: AC
  Administered 2019-11-10: 1000 mg via INTRAVENOUS
  Filled 2019-11-10: qty 100

## 2019-11-10 MED ORDER — PHENYLEPHRINE HCL (PRESSORS) 10 MG/ML IV SOLN
INTRAVENOUS | Status: DC | PRN
  Administered 2019-11-10 (×3): 80 ug via INTRAVENOUS

## 2019-11-10 MED ORDER — PHENYLEPHRINE 40 MCG/ML (10ML) SYRINGE FOR IV PUSH (FOR BLOOD PRESSURE SUPPORT)
PREFILLED_SYRINGE | INTRAVENOUS | Status: AC
  Filled 2019-11-10: qty 10

## 2019-11-10 MED ORDER — LISINOPRIL 20 MG PO TABS
20.0000 mg | ORAL_TABLET | Freq: Every day | ORAL | Status: DC
  Administered 2019-11-10 – 2019-11-12 (×3): 20 mg via ORAL
  Filled 2019-11-10 (×3): qty 1

## 2019-11-10 MED ORDER — TRAMADOL HCL 50 MG PO TABS
50.0000 mg | ORAL_TABLET | Freq: Four times a day (QID) | ORAL | Status: DC
  Administered 2019-11-10 – 2019-11-12 (×7): 50 mg via ORAL
  Filled 2019-11-10 (×7): qty 1

## 2019-11-10 MED ORDER — BUPIVACAINE LIPOSOME 1.3 % IJ SUSP
INTRAMUSCULAR | Status: DC | PRN
  Administered 2019-11-10: 10 mL via PERINEURAL

## 2019-11-10 MED ORDER — 0.9 % SODIUM CHLORIDE (POUR BTL) OPTIME
TOPICAL | Status: DC | PRN
  Administered 2019-11-10: 1000 mL

## 2019-11-10 MED ORDER — FENTANYL CITRATE (PF) 100 MCG/2ML IJ SOLN
INTRAMUSCULAR | Status: DC | PRN
  Administered 2019-11-10: 50 ug via INTRAVENOUS
  Administered 2019-11-10: 100 ug via INTRAVENOUS
  Administered 2019-11-10 (×2): 50 ug via INTRAVENOUS

## 2019-11-10 MED ORDER — EPHEDRINE 5 MG/ML INJ
INTRAVENOUS | Status: AC
  Filled 2019-11-10: qty 10

## 2019-11-10 MED ORDER — IRRISEPT - 450ML BOTTLE WITH 0.05% CHG IN STERILE WATER, USP 99.95% OPTIME
TOPICAL | Status: DC | PRN
  Administered 2019-11-10: 450 mL via TOPICAL

## 2019-11-10 MED ORDER — ONDANSETRON HCL 4 MG/2ML IJ SOLN: 4.0000 mg | Freq: Four times a day (QID) | INTRAMUSCULAR | Status: DC | PRN

## 2019-11-10 MED ORDER — ASPIRIN 81 MG PO CHEW
81.0000 mg | CHEWABLE_TABLET | Freq: Every day | ORAL | Status: DC
  Administered 2019-11-10 – 2019-11-12 (×3): 81 mg via ORAL
  Filled 2019-11-10 (×3): qty 1

## 2019-11-10 MED ORDER — METHOCARBAMOL 1000 MG/10ML IJ SOLN
500.0000 mg | Freq: Four times a day (QID) | INTRAVENOUS | Status: DC | PRN
  Filled 2019-11-10: qty 5

## 2019-11-10 MED ORDER — MEPERIDINE HCL 25 MG/ML IJ SOLN: 6.2500 mg | INTRAMUSCULAR | Status: DC | PRN

## 2019-11-10 MED ORDER — DEXAMETHASONE SODIUM PHOSPHATE 10 MG/ML IJ SOLN
INTRAMUSCULAR | Status: AC
  Filled 2019-11-10: qty 1

## 2019-11-10 MED ORDER — BUPIVACAINE-EPINEPHRINE (PF) 0.5% -1:200000 IJ SOLN
INTRAMUSCULAR | Status: DC | PRN
  Administered 2019-11-10: 20 mL via PERINEURAL

## 2019-11-10 MED ORDER — ONDANSETRON HCL 4 MG/2ML IJ SOLN
INTRAMUSCULAR | Status: AC
  Filled 2019-11-10: qty 2

## 2019-11-10 MED ORDER — CHLORHEXIDINE GLUCONATE 4 % EX LIQD: 60.0000 mL | Freq: Once | CUTANEOUS | Status: DC

## 2019-11-10 MED ORDER — DOCUSATE SODIUM 100 MG PO CAPS
100.0000 mg | ORAL_CAPSULE | Freq: Two times a day (BID) | ORAL | Status: DC
  Administered 2019-11-10 – 2019-11-12 (×5): 100 mg via ORAL
  Filled 2019-11-10 (×5): qty 1

## 2019-11-10 MED ORDER — OXYCODONE HCL 5 MG/5ML PO SOLN: 5.0000 mg | Freq: Once | ORAL | Status: DC | PRN

## 2019-11-10 MED ORDER — VANCOMYCIN HCL 1000 MG IV SOLR
INTRAVENOUS | Status: DC | PRN
  Administered 2019-11-10: 1000 mg via TOPICAL

## 2019-11-10 MED ORDER — ONDANSETRON HCL 4 MG/2ML IJ SOLN
INTRAMUSCULAR | Status: DC | PRN
  Administered 2019-11-10: 4 mg via INTRAVENOUS

## 2019-11-10 MED ORDER — LIDOCAINE 2% (20 MG/ML) 5 ML SYRINGE
INTRAMUSCULAR | Status: AC
  Filled 2019-11-10: qty 5

## 2019-11-10 MED ORDER — LIDOCAINE 2% (20 MG/ML) 5 ML SYRINGE
INTRAMUSCULAR | Status: DC | PRN
  Administered 2019-11-10: 100 mg via INTRAVENOUS

## 2019-11-10 MED ORDER — ONDANSETRON HCL 4 MG PO TABS
4.0000 mg | ORAL_TABLET | Freq: Four times a day (QID) | ORAL | Status: DC | PRN
  Administered 2019-11-12: 4 mg via ORAL
  Filled 2019-11-10: qty 1

## 2019-11-10 MED ORDER — PHENOL 1.4 % MT LIQD: 1.0000 | OROMUCOSAL | Status: DC | PRN

## 2019-11-10 MED ORDER — PROPOFOL 10 MG/ML IV BOLUS
INTRAVENOUS | Status: AC
  Filled 2019-11-10: qty 40

## 2019-11-10 MED ORDER — VANCOMYCIN HCL 1000 MG IV SOLR
INTRAVENOUS | Status: AC
  Filled 2019-11-10: qty 1000

## 2019-11-10 MED ORDER — MIDAZOLAM HCL 5 MG/5ML IJ SOLN
INTRAMUSCULAR | Status: DC | PRN
  Administered 2019-11-10: 2 mg via INTRAVENOUS

## 2019-11-10 MED ORDER — LACTATED RINGERS IV SOLN
INTRAVENOUS | Status: DC
  Administered 2019-11-10 (×2): via INTRAVENOUS

## 2019-11-10 MED ORDER — SIMVASTATIN 20 MG PO TABS
20.0000 mg | ORAL_TABLET | Freq: Every evening | ORAL | Status: DC
  Administered 2019-11-10 – 2019-11-11 (×2): 20 mg via ORAL
  Filled 2019-11-10 (×2): qty 1

## 2019-11-10 MED ORDER — VANCOMYCIN HCL IN DEXTROSE 1-5 GM/200ML-% IV SOLN
1000.0000 mg | INTRAVENOUS | Status: AC
  Administered 2019-11-10: 1000 mg via INTRAVENOUS
  Filled 2019-11-10: qty 200

## 2019-11-10 MED ORDER — ACETAMINOPHEN 325 MG PO TABS
325.0000 mg | ORAL_TABLET | Freq: Four times a day (QID) | ORAL | Status: DC | PRN
  Administered 2019-11-11 – 2019-11-12 (×3): 650 mg via ORAL
  Filled 2019-11-10 (×3): qty 2

## 2019-11-10 MED ORDER — SUCCINYLCHOLINE CHLORIDE 20 MG/ML IJ SOLN
INTRAMUSCULAR | Status: DC | PRN
  Administered 2019-11-10: 50 mg via INTRAVENOUS
  Administered 2019-11-10: 150 mg via INTRAVENOUS

## 2019-11-10 MED ORDER — MIDAZOLAM HCL 2 MG/2ML IJ SOLN
INTRAMUSCULAR | Status: AC
  Filled 2019-11-10: qty 2

## 2019-11-10 MED ORDER — MENTHOL 3 MG MT LOZG: 1.0000 | LOZENGE | OROMUCOSAL | Status: DC | PRN

## 2019-11-10 MED ORDER — HYDROMORPHONE HCL 1 MG/ML IJ SOLN
0.5000 mg | INTRAMUSCULAR | Status: DC | PRN
  Administered 2019-11-11: 0.5 mg via INTRAVENOUS
  Filled 2019-11-10: qty 0.5

## 2019-11-10 MED ORDER — METOCLOPRAMIDE HCL 5 MG/ML IJ SOLN: 5.0000 mg | Freq: Three times a day (TID) | INTRAMUSCULAR | Status: DC | PRN

## 2019-11-10 MED ORDER — PHENYLEPHRINE HCL-NACL 10-0.9 MG/250ML-% IV SOLN
INTRAVENOUS | Status: DC | PRN
  Administered 2019-11-10: 25 ug/min via INTRAVENOUS

## 2019-11-10 MED ORDER — PROPOFOL 10 MG/ML IV BOLUS
INTRAVENOUS | Status: DC | PRN
  Administered 2019-11-10 (×2): 50 mg via INTRAVENOUS
  Administered 2019-11-10: 100 mg via INTRAVENOUS

## 2019-11-10 MED ORDER — SUCCINYLCHOLINE CHLORIDE 200 MG/10ML IV SOSY
PREFILLED_SYRINGE | INTRAVENOUS | Status: AC
  Filled 2019-11-10: qty 10

## 2019-11-10 MED ORDER — FENTANYL CITRATE (PF) 100 MCG/2ML IJ SOLN: 25.0000 ug | INTRAMUSCULAR | Status: DC | PRN

## 2019-11-10 MED ORDER — INSULIN ASPART 100 UNIT/ML ~~LOC~~ SOLN
0.0000 [IU] | Freq: Every day | SUBCUTANEOUS | Status: DC
  Administered 2019-11-10: 3 [IU] via SUBCUTANEOUS

## 2019-11-10 MED ORDER — ONDANSETRON HCL 4 MG/2ML IJ SOLN: 4.0000 mg | Freq: Once | INTRAMUSCULAR | Status: DC | PRN

## 2019-11-10 MED ORDER — LACTATED RINGERS IV SOLN
INTRAVENOUS | Status: DC
  Administered 2019-11-10: 13:00:00 via INTRAVENOUS

## 2019-11-10 MED ORDER — INSULIN ASPART 100 UNIT/ML ~~LOC~~ SOLN
0.0000 [IU] | Freq: Three times a day (TID) | SUBCUTANEOUS | Status: DC
  Administered 2019-11-10: 8 [IU] via SUBCUTANEOUS
  Administered 2019-11-11: 5 [IU] via SUBCUTANEOUS
  Administered 2019-11-11: 2 [IU] via SUBCUTANEOUS
  Administered 2019-11-11 – 2019-11-12 (×2): 3 [IU] via SUBCUTANEOUS

## 2019-11-10 MED ORDER — VANCOMYCIN HCL IN DEXTROSE 1-5 GM/200ML-% IV SOLN
1000.0000 mg | Freq: Two times a day (BID) | INTRAVENOUS | Status: AC
  Administered 2019-11-10: 1000 mg via INTRAVENOUS
  Filled 2019-11-10: qty 200

## 2019-11-10 SURGICAL SUPPLY — 91 items
AID PSTN UNV HD RSTRNT DISP (MISCELLANEOUS) ×1
ALCOHOL 70% 16 OZ (MISCELLANEOUS) ×3 IMPLANT
APL PRP STRL LF DISP 70% ISPRP (MISCELLANEOUS) ×1
BASEPLATE AUG MED W-TAPER (Plate) ×2 IMPLANT
BEARING HUMERAL SHLDER 36M STD (Shoulder) IMPLANT
BIT DRILL 2.7 W/STOP DISP (BIT) ×1 IMPLANT
BIT DRILL 2.7MM W/STOP DISP (BIT) ×1
BIT DRILL TWIST 2.7 (BIT) ×1 IMPLANT
BIT DRILL TWIST 2.7MM (BIT) ×1
BLADE SAW SGTL 13X75X1.27 (BLADE) ×3 IMPLANT
BRNG HUM STD 36 RVRS SHLDR (Shoulder) ×1 IMPLANT
BSPLAT GLND MED AUG TPR ADPR (Plate) ×1 IMPLANT
CABLE CERLAGE W/CRIMP 1.8 (Cable) ×1 IMPLANT
CABLE CERLAGE W/CRIMP 1.8MM (Cable) ×1 IMPLANT
CHLORAPREP W/TINT 26 (MISCELLANEOUS) ×3 IMPLANT
CLOSURE STERI-STRIP 1/2X4 (GAUZE/BANDAGES/DRESSINGS) ×1
CLOSURE WOUND 1/2 X4 (GAUZE/BANDAGES/DRESSINGS) ×1
CLSR STERI-STRIP ANTIMIC 1/2X4 (GAUZE/BANDAGES/DRESSINGS) ×1 IMPLANT
COVER SURGICAL LIGHT HANDLE (MISCELLANEOUS) ×3 IMPLANT
COVER WAND RF STERILE (DRAPES) ×3 IMPLANT
DRAPE INCISE IOBAN 66X45 STRL (DRAPES) ×3 IMPLANT
DRAPE U-SHAPE 47X51 STRL (DRAPES) ×6 IMPLANT
DRESSING AQUACEL AG SP 3.5X10 (GAUZE/BANDAGES/DRESSINGS) IMPLANT
DRSG AQUACEL AG ADV 3.5X10 (GAUZE/BANDAGES/DRESSINGS) ×3 IMPLANT
DRSG AQUACEL AG SP 3.5X10 (GAUZE/BANDAGES/DRESSINGS) ×3
ELECT BLADE 4.0 EZ CLEAN MEGAD (MISCELLANEOUS) ×3
ELECT REM PT RETURN 9FT ADLT (ELECTROSURGICAL) ×3
ELECTRODE BLDE 4.0 EZ CLN MEGD (MISCELLANEOUS) ×1 IMPLANT
ELECTRODE REM PT RTRN 9FT ADLT (ELECTROSURGICAL) ×1 IMPLANT
GAUZE SPONGE 4X4 12PLY STRL LF (GAUZE/BANDAGES/DRESSINGS) ×3 IMPLANT
GLENOID SPHERE STD STRL 36MM (Orthopedic Implant) ×2 IMPLANT
GLOVE BIOGEL PI IND STRL 7.0 (GLOVE) ×1 IMPLANT
GLOVE BIOGEL PI IND STRL 8 (GLOVE) ×1 IMPLANT
GLOVE BIOGEL PI INDICATOR 7.0 (GLOVE) ×2
GLOVE BIOGEL PI INDICATOR 8 (GLOVE) ×2
GLOVE ECLIPSE 7.0 STRL STRAW (GLOVE) ×3 IMPLANT
GLOVE ECLIPSE 8.0 STRL XLNG CF (GLOVE) ×3 IMPLANT
GOWN STRL REUS W/ TWL LRG LVL3 (GOWN DISPOSABLE) ×2 IMPLANT
GOWN STRL REUS W/ TWL XL LVL3 (GOWN DISPOSABLE) IMPLANT
GOWN STRL REUS W/TWL LRG LVL3 (GOWN DISPOSABLE) ×6
GOWN STRL REUS W/TWL XL LVL3 (GOWN DISPOSABLE)
GUIDE MODEL REV SHLD RT (ORTHOPEDIC DISPOSABLE SUPPLIES) ×2 IMPLANT
HYDROGEN PEROXIDE 16OZ (MISCELLANEOUS) ×3 IMPLANT
JET LAVAGE IRRISEPT WOUND (IRRIGATION / IRRIGATOR) ×3
KIT BASIN OR (CUSTOM PROCEDURE TRAY) ×3 IMPLANT
KIT TURNOVER KIT B (KITS) ×3 IMPLANT
LAVAGE JET IRRISEPT WOUND (IRRIGATION / IRRIGATOR) ×1 IMPLANT
LOOP VESSEL MAXI BLUE (MISCELLANEOUS) ×3 IMPLANT
MANIFOLD NEPTUNE II (INSTRUMENTS) ×3 IMPLANT
NDL SUT 6 .5 CRC .975X.05 MAYO (NEEDLE) IMPLANT
NDL TAPERED W/ NITINOL LOOP (MISCELLANEOUS) ×1 IMPLANT
NEEDLE MAYO TAPER (NEEDLE)
NEEDLE TAPERED W/ NITINOL LOOP (MISCELLANEOUS) ×3 IMPLANT
NS IRRIG 1000ML POUR BTL (IV SOLUTION) ×3 IMPLANT
PACK SHOULDER (CUSTOM PROCEDURE TRAY) ×3 IMPLANT
PAD ARMBOARD 7.5X6 YLW CONV (MISCELLANEOUS) ×6 IMPLANT
PASSER SUT SWANSON 36MM LOOP (INSTRUMENTS) ×3 IMPLANT
PIN THREADED REVERSE (PIN) ×4 IMPLANT
REAMER GUIDE BUSHING SURG DISP (MISCELLANEOUS) ×2 IMPLANT
REAMER GUIDE W/SCREW AUG (MISCELLANEOUS) ×2 IMPLANT
RESTRAINT HEAD UNIVERSAL NS (MISCELLANEOUS) ×3 IMPLANT
SCREW BONE STRL 6.5MMX25MM (Screw) ×2 IMPLANT
SCREW LOCKING 4.75MMX15MM (Screw) ×4 IMPLANT
SCREW LOCKING STRL 4.75X25X3.5 (Screw) ×4 IMPLANT
SHOULDER HUMERAL BEAR 36M STD (Shoulder) ×3 IMPLANT
SLING ARM IMMOBILIZER LRG (SOFTGOODS) ×3 IMPLANT
SLING ARM IMMOBILIZER MED (SOFTGOODS) ×2 IMPLANT
SOL PREP POV-IOD 4OZ 10% (MISCELLANEOUS) ×3 IMPLANT
SPONGE LAP 18X18 RF (DISPOSABLE) ×3 IMPLANT
STEM HUMERAL STRL 6MMX83MM (Stem) ×2 IMPLANT
STRIP CLOSURE SKIN 1/2X4 (GAUZE/BANDAGES/DRESSINGS) ×2 IMPLANT
SUCTION FRAZIER HANDLE 10FR (MISCELLANEOUS) ×2
SUCTION TUBE FRAZIER 10FR DISP (MISCELLANEOUS) ×1 IMPLANT
SUT BROADBAND TAPE 2PK 1.5 (SUTURE) ×4 IMPLANT
SUT FIBERWIRE #2 38 T-5 BLUE (SUTURE)
SUT MAXBRAID (SUTURE) IMPLANT
SUT MNCRL AB 3-0 PS2 18 (SUTURE) ×3 IMPLANT
SUT SILK 2 0 TIES 10X30 (SUTURE) ×3 IMPLANT
SUT VIC AB 0 CT1 27 (SUTURE) ×12
SUT VIC AB 0 CT1 27XBRD ANBCTR (SUTURE) ×4 IMPLANT
SUT VIC AB 1 CT1 27 (SUTURE) ×6
SUT VIC AB 1 CT1 27XBRD ANBCTR (SUTURE) ×2 IMPLANT
SUT VIC AB 2-0 CT1 27 (SUTURE) ×9
SUT VIC AB 2-0 CT1 TAPERPNT 27 (SUTURE) ×3 IMPLANT
SUT VICRYL 0 UR6 27IN ABS (SUTURE) ×6 IMPLANT
SUTURE FIBERWR #2 38 T-5 BLUE (SUTURE) IMPLANT
TOWEL GREEN STERILE (TOWEL DISPOSABLE) ×3 IMPLANT
TRAY FOL W/BAG SLVR 16FR STRL (SET/KITS/TRAYS/PACK) IMPLANT
TRAY FOLEY W/BAG SLVR 16FR LF (SET/KITS/TRAYS/PACK)
TRAY HUM REV SHOULDER STD +6 (Shoulder) ×2 IMPLANT
WATER STERILE IRR 1000ML POUR (IV SOLUTION) ×3 IMPLANT

## 2019-11-10 NOTE — Op Note (Signed)
NAME: Kurt [name], Kurt [name] MEDICAL RECORD JA:[id number] ACCOUNT [id number] DATE OF BIRTH:[birth year] FACILITY: [facility] LOCATION: [facility] PHYSICIAN:GREGORY Diamantina [place], MD  OPERATIVE REPORT  DATE OF PROCEDURE:  11/10/2019  PREOPERATIVE DIAGNOSIS:  Right shoulder arthritis.  POSTOPERATIVE DIAGNOSIS:  Right shoulder arthritis.  PROCEDURE:  Right reverse shoulder replacement utilizing Biomet implants size 6 mini-humeral stem with standard thickness humeral tray, +6 offset and medium augment reverse baseplate with 36 standard glenosphere.  SURGEON:  Cammy Copa, MD  ASSISTANT:  Karenann Cai, PA.  INDICATIONS:  This is a 68 year old patient with osteogenesis imperfecta and right shoulder arthritis.  He presents now for operative management of severe right shoulder arthritis after explanation of risks and benefits.  PROCEDURE IN DETAIL:  The patient was brought to the operating room where general endotracheal anesthesia was induced.  Preoperative antibiotics administered.  Timeout was called.  Right shoulder pre-scrubbed with hydrogen peroxide and alcohol and then  prepped with ChloraPrep solution and draped in a sterile manner using non-Betadine impregnated Ioban to cover the shoulder.  Head was in neutral position.  The patient did have about 50 degrees of external rotation at 15 degrees of abduction, but limited  forward flexion and abduction to around 95-100 degrees passively.  The timeout was called.  Deltopectoral approach to the shoulder was utilized.  Cephalic vein mobilized medially.  Deltoid elevated off of the rotator cuff subdeltoid fashion.  The  anterior portion of the humeral attachment of the deltoid was also manually elevated to improve exposure.  The biceps tendon was tenodesed to the pec tendon.  Rotator interval was opened.  Circumflex vessels were ligated with silk ligatures.  Axillary  nerve was identified and a vessel loop placed around it.  It was protected at all times during  the remaining portion of the case.  At this time the subscapularis was peeled with a 15 blade off of the lesser tuberosity.  The soft tissue dissection of the  capsule was performed around to the 5 o'clock position.  Soft capsular tissue was also elevated off of the 2 cm of the proximal medial humeral neck.  Osteophytes were removed.  Rotator cuff supraspinatus and infraspinatus were intact.  Subscapularis  release also performed.  Rotator interval completely opened to the base of the coracoid.  The coracohumeral ligament also released.  At this time, proximal reaming of the head was performed.  Bone quality was marginal as would be expected with  osteogenesis imperfecta, but it was secure enough to hold an implant.  The humeral shaft was reamed up to a size 6 cut in 30 degrees of retroversion. This was taken back to the interface of the greater tuberosity rotator cuff attachment of the humeral  articular surface.  At this time, the humerus was broached up to a size 6.  A cap was placed.  Circumferential 360 degree capsular release was then performed with a posterior retractor in place.  Biceps tendon was also released.  The posterior and  anterior hip retractors were placed.  Anterior labrum and capsule excised with care being taken to avoid injury to the axillary nerve.  We were able to achieve adequate exposure for placement of the glenoid.  In accordance with the patient's specific  plan and initial instrumentation, the pin was placed into good position into the glenoid.  Reaming was performed.  Augment was required to get the baseplate tilted about 7 or 8 degrees inferiorly.  Good quality bleeding bone was encountered.  The  baseplate was then placed after  preparations with 1 peripheral compression screw, which had excellent fixation and 4 peripheral locking screws.  A 36 standard glenosphere was placed.  The attention was then directed toward the humeral side.  With the 6  broach in place, a standard  offset +6 mm combination was utilized and this gave a difficult but achievable reduction and very good stability to forward flexion, abduction, external rotation as well as with adduction, forward force and extension.  This  was removed and the true components placed after 4 Arthrex suture tapes were placed into the lesser tuberosity.  The same stability parameters were maintained.  The true components in terms of the humeral tray and polyethylene were placed with same  stability parameters maintained.  At this time, thorough irrigation was performed with 2 liters of irrigating solution.  IrriSept solution was utilized throughout the entire case both after the incision, after the arthrotomy as well as throughout the  case.  Vancomycin powder placed within the joint.  Subscapularis was repaired using the 4 suture tapes with the arm in 45 degrees of external rotation.  Rotator interval also closed with the arm in 45 degrees of external rotation using #1 Vicryl suture.   Thorough irrigation again performed.  The deltopectoral interval was closed using 0 Vicryl suture followed by 2-0 Vicryl suture and a 3-0 Monocryl with Steri-Strips and Aquacel dressing.  Luke's assistance was required at all times during the case for  retraction as well as mobilization of the tissue opening and closing.  His assistance was of medical necessity.  TN/NUANCE  D:11/10/2019 T:11/10/2019 JOB:[id number]

## 2019-11-10 NOTE — Transfer of Care (Signed)
Immediate Anesthesia Transfer of Care Note  Patient: Kurt [name]  Procedure(s) Performed: RIGHT REVERSE SHOULDER ARTHROPLASTY (Right Shoulder)  Patient Location: PACU  Anesthesia Type:General and Regional  Level of Consciousness: awake, alert , oriented and sedated  Airway & Oxygen Therapy: Patient Spontanous Breathing and Patient connected to face mask oxygen  Post-op Assessment: Report given to RN, Post -op Vital signs reviewed and stable and Patient moving all extremities  Post vital signs: Reviewed and stable  Last Vitals:  Vitals Value Taken Time  BP 107/73 11/10/19 1142  Temp    Pulse 96 11/10/19 1147  Resp 20 11/10/19 1147  SpO2 95 % 11/10/19 1147  Vitals shown include unvalidated device data.  Last Pain:  Vitals:   11/10/19 0623  PainSc: 9       Patients Stated Pain Goal: 4 (70/62/37 [id number])  Complications: No apparent anesthesia complications

## 2019-11-10 NOTE — Anesthesia Procedure Notes (Signed)
Anesthesia Regional Block: Interscalene brachial plexus block   Pre-Anesthetic Checklist: ,, timeout performed, Correct Patient, Correct Site, Correct Laterality, Correct Procedure, Correct Position, site marked, Risks and benefits discussed,  Surgical consent,  Pre-op evaluation,  At surgeon's request and post-op pain management  Laterality: Right  Prep: chloraprep       Needles:  Injection technique: Single-shot  Needle Type: Echogenic Stimulator Needle     Needle Length: 5cm  Needle Gauge: 22     Additional Needles:   Procedures:, nerve stimulator,,, ultrasound used (permanent image in chart),,,,   Nerve Stimulator or Paresthesia:  Response: hand, 0.45 mA,   Additional Responses:   Narrative:  Start time: 11/10/2019 7:07 AM End time: 11/10/2019 7:16 AM Injection made incrementally with aspirations every 5 mL.  Performed by: Personally  Anesthesiologist: Janeece Riggers, MD  Additional Notes: Functioning IV was confirmed and monitors were applied.  A 9mm 22ga Arrow echogenic stimulator needle was used. Sterile prep and drape,hand hygiene and sterile gloves were used. Ultrasound guidance: relevant anatomy identified, needle position confirmed, local anesthetic spread visualized around nerve(s)., vascular puncture avoided.  Image printed for medical record. Negative aspiration and negative test dose prior to incremental administration of local anesthetic. The patient tolerated the procedure well.

## 2019-11-10 NOTE — Anesthesia Postprocedure Evaluation (Signed)
Anesthesia Post Note  Patient: Kurt [name]  Procedure(s) Performed: RIGHT REVERSE SHOULDER ARTHROPLASTY (Right Shoulder)     Patient location during evaluation: PACU Anesthesia Type: Regional and General Level of consciousness: awake and alert Pain management: pain level controlled Vital Signs Assessment: post-procedure vital signs reviewed and stable Respiratory status: spontaneous breathing, nonlabored ventilation, respiratory function stable and patient connected to nasal cannula oxygen Cardiovascular status: blood pressure returned to baseline and stable Postop Assessment: no apparent nausea or vomiting Anesthetic complications: no    Last Vitals:  Vitals:   11/10/19 1302 11/10/19 1542  BP: 121/86 129/80  Pulse: 97 95  Resp: 16 16  Temp: (!) 36.2 C 36.8 C  SpO2: 96% 97%    Last Pain:  Vitals:   11/10/19 1542  TempSrc: Oral  PainSc:                  [name]

## 2019-11-10 NOTE — Progress Notes (Signed)
Patient stable Block is working on the right arm hAnd is perfused X-rays look good Plan occupational therapy tomorrow and discharged home at that time I recommended to the patient that he do use the CPM to facilitate motion.  He will consider that.  If no CPM is decided upon then home health therapy would be indicated for at least a week or 2.

## 2019-11-10 NOTE — Brief Op Note (Signed)
   11/10/2019  11:14 AM  PATIENT:  Melina Copa Duca  68 y.o. male  PRE-OPERATIVE DIAGNOSIS:  right shoulder osteoarthritis  POST-OPERATIVE DIAGNOSIS:  right shoulder osteoarthritis  PROCEDURE:  Procedure(s): RIGHT REVERSE SHOULDER ARTHROPLASTY  SURGEON:  Surgeon(s): Meredith Pel, MD  ASSISTANT: magnant pa  ANESTHESIA:   general  EBL: 75 ml    Total I/O In: 1500 [I.V.:1500] Out: 1300 [Urine:1100; Blood:200]  BLOOD ADMINISTERED: none  DRAINS: none   LOCAL MEDICATIONS USED:  none  SPECIMEN:  No Specimen  COUNTS:  YES  TOURNIQUET:  * No tourniquets in log *  DICTATION: .Other Dictation: Dictation Number [id number]  PLAN OF CARE: Admit to inpatient   PATIENT DISPOSITION:  PACU - hemodynamically stable

## 2019-11-10 NOTE — Anesthesia Procedure Notes (Addendum)
Procedure Name: Intubation Date/Time: 11/10/2019 7:33 AM Performed by: Scheryl Darter, CRNA Pre-anesthesia Checklist: Patient identified, Emergency Drugs available, Suction available and Patient being monitored Patient Re-evaluated:Patient Re-evaluated prior to induction Oxygen Delivery Method: Circle System Utilized Preoxygenation: Pre-oxygenation with 100% oxygen Induction Type: IV induction Ventilation: Mask ventilation without difficulty Laryngoscope Size: Glidescope and 4 Grade View: Grade II Tube type: Oral Number of attempts: 1 Airway Equipment and Method: Stylet and Oral airway Placement Confirmation: ETT inserted through vocal cords under direct vision,  positive ETCO2 and breath sounds checked- equal and bilateral Secured at: 23 cm Tube secured with: Tape Dental Injury: Teeth and Oropharynx as per pre-operative assessment  Difficulty Due To: Difficulty was anticipated and Difficult Airway- due to anterior larynx

## 2019-11-10 NOTE — H&P (Signed)
Kurt [name] is an 68 y.o. male.   Chief Complaint: Right shoulder pain HPI: Kurt [name] is a 68 year old patient with longstanding history of right shoulder pain.  He has end-stage arthritis with limitation of function.  Describes pain and diminished range of motion on a daily basis as well as difficulty with activities of daily living.  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  He has had no recent fractures.  He has failed conservative measures and despite significant mitigating factors desires shoulder replacement to help with his pain and activities of daily living.  Past Medical History:  Diagnosis Date  . Arthritis   . Bronchitis   . Bronchitis   . Concussion    late 1990's  after a fall  . Diabetes mellitus without complication ([place])   . GERD (gastroesophageal reflux disease)   . History of kidney stones   . Hypertension   . Osteogenesis imperfecta   . PONV (postoperative nausea and vomiting)    pt has post polio syndrome  . Post-polio syndrome     Past Surgical History:  Procedure Laterality Date  . ANKLE FRACTURE SURGERY Bilateral   . arm surgery Right    nerve surgery  . COLONOSCOPY    . ELBOW FRACTURE SURGERY Left   . EYE MUSCLE SURGERY Left   . LEG SURGERY Left    femur fracture with rod  . TONSILLECTOMY      Family History  Problem Relation Age of Onset  . Hypertension Mother   . Diabetes Brother    Social History:  reports that he has never smoked. He has never used smokeless tobacco. He reports current alcohol use. He reports that he does not use drugs.  Allergies:  Allergies  Allergen Reactions  . Augmentin [Amoxicillin-Pot Clavulanate] Nausea And Vomiting  . Iodine Hives  . Tape Rash    Paper     Medications Prior to Admission  Medication Sig Dispense Refill  . acetaminophen (TYLENOL) 650 MG CR tablet Take 1,300 mg by mouth every 8 (eight) hours as needed for pain.    Marland Kitchen glipiZIDE (GLUCOTROL XL) 10 MG 24 hr tablet  Take 10 mg by mouth at bedtime.     Marland Kitchen LANTUS SOLOSTAR 100 UNIT/ML Solostar Pen Inject 33 Units into the skin at bedtime.     Marland Kitchen lisinopril (PRINIVIL,ZESTRIL) 20 MG tablet Take 20 mg by mouth daily.    . methocarbamol (ROBAXIN) 500 MG tablet Take 500 mg by mouth at bedtime.     . simvastatin (ZOCOR) 20 MG tablet Take 20 mg by mouth every evening.    . SitaGLIPtin-MetFORMIN HCl (JANUMET XR) [id number] MG TB24 Take 1 tablet by mouth at bedtime.    Marland Kitchen HYDROcodone-acetaminophen (NORCO) 7.5-325 MG tablet Take 1 tablet by mouth every 6 (six) hours as needed for moderate pain or severe pain. (Patient not taking: Reported on 10/29/2019) 10 tablet 0  . Olopatadine HCl 0.7 % SOLN Apply to eye.      Results for orders placed or performed during the hospital encounter of 11/10/19 (from the past 48 hour(s))  Glucose, capillary     Status: Abnormal   Collection Time: 11/10/19  6:13 AM  Result Value Ref Range   Glucose-Capillary 103 (H) 70 - 99 mg/dL   No results found.  Review of Systems  Musculoskeletal: Positive for joint pain.  All other systems reviewed and are negative.   Blood pressure 125/75, pulse 98, temperature 98.2 F (36.8 C),  resp. rate 20, SpO2 96 %. Physical Exam  Constitutional: He appears well-developed.  HENT:  Head: Normocephalic.  Eyes: Pupils are equal, round, and reactive to light.  Neck: Normal range of motion.  Cardiovascular: Normal rate.  Respiratory: Effort normal.  Neurological: He is alert.  Skin: Skin is warm.  Psychiatric: He has a normal mood and affect.  Examination of the right shoulder demonstrates intact deltoid function.  Patient has limitation of active forward flexion abduction below 90 degrees.  Patient has prior incisions on the right arm from prior surgeries.  Patient has fairly reasonable subscapularis strength but limitation of external rotation on the right shoulder compared to the left  Assessment/Plan Impression is end-stage right shoulder arthritis  in a patient with osteogenesis imperfecta.  Plan at this time is deltopectoral approach with initial cut on the proximal humerus.  We will assess bone quality at that time and decide if it is prudent to proceed with reverse shoulder replacement based on exposure is as well as bone quality.  If the bone quality is poor and exposure proves difficult and we may hold at hemiarthroplasty as opposed to risk glenoid fracture.  May also consider use of cables in the proximal humeral region depending on bone quality and whether or not we decide to go with press-fit or cement on the humeral side.  The risk and benefits of the procedure are discussed including not limited to intraoperative fracture instability nerve and vessel damage potentially being worse off when he finished and when we started as well as potential need for more surgery.  Patient understands risk and benefits and they are substantial due to his osteogenesis imperfecta.  All this is discussed at length with the patient both in the clinic as well as this morning.  Patient understands and wishes to proceed.  All questions answered.  The effect on his quality of life is significant he wants to proceed with an intervention which can possibly help with that quality of life  Burnard Bunting, MD 11/10/2019, 7:08 AM

## 2019-11-11 ENCOUNTER — Encounter (HOSPITAL_COMMUNITY): Payer: Self-pay | Admitting: Orthopedic Surgery

## 2019-11-11 LAB — GLUCOSE, CAPILLARY
Glucose-Capillary: 161 mg/dL — ABNORMAL HIGH (ref 70–99)
Glucose-Capillary: 139 mg/dL — ABNORMAL HIGH (ref 70–99)
Glucose-Capillary: 219 mg/dL — ABNORMAL HIGH (ref 70–99)
Glucose-Capillary: 191 mg/dL — ABNORMAL HIGH (ref 70–99)

## 2019-11-11 MED ORDER — ASPIRIN 81 MG PO CHEW: 81.0000 mg | CHEWABLE_TABLET | Freq: Every day | ORAL | 0 refills | Status: DC

## 2019-11-11 MED ORDER — OXYCODONE HCL 5 MG PO TABS: 5.0000 mg | ORAL_TABLET | ORAL | 0 refills | Status: DC | PRN

## 2019-11-11 NOTE — Progress Notes (Signed)
  Subjective: Kurt [name] is a 68 y.o. male s/p right RSA.  They are POD1.  Pt's pain is controlled.  Interscalene block still in effect.    Objective: Vital signs in last 24 hours: Temp:  [97.1 F (36.2 C)-98.6 F (37 C)] 98.6 F (37 C) (11/25 0737) Pulse Rate:  [79-101] 79 (11/25 0737) Resp:  [13-20] 16 (11/25 0737) BP: (98-130)/(64-91) 126/78 (11/25 0737) SpO2:  [93 %-100 %] 96 % (11/25 0737)  Intake/Output from previous day: 11/24 0701 - 11/25 0700 In: 1980 [P.O.:480; I.V.:1500] Out: 3180 [Urine:2980; Blood:200] Intake/Output this shift: Total I/O In: 480 [P.O.:480] Out: -   Exam:  No gross blood or drainage overlying the dressing 2+ radial pulse Sensation diminished throughout R arm due to block Unable to move fingers of R hand yet   Labs: No results for input(s): HGB in the last 72 hours. No results for input(s): WBC, RBC, HCT, PLT in the last 72 hours. No results for input(s): NA, K, CL, CO2, BUN, CREATININE, GLUCOSE, CALCIUM in the last 72 hours. No results for input(s): LABPT, INR in the last 72 hours.  Assessment/Plan: Pt is POD1 s/p right RSA    -Plan to discharge to home today or tomorrow pending patient's pain  -No lifting with the operative arm  -Okay to shower, dressing is waterproof.  Cautioned patient against soaking dressing in bath/pool/body of water  -Use the CPM machine at least 3 times per day for one hour each time, increasing the degrees daily.     Fransheska Willingham L Wilgus Deyton 11/11/2019, 11:39 AM

## 2019-11-11 NOTE — Evaluation (Signed)
Occupational Therapy Evaluation Patient Details Name: Kurt [name] MRN: [id number] DOB: 04-02-1951 Today's Date: 11/11/2019    History of Present Illness Pt is a 68 y/o male with PMH of DM, HTN, osteogeneisis imperfecta, post-polio syndrome, L elbow fx surgery, presenting with longstanding history of R shoulder pain s/p R shoulder reverse shoulder arthroplasty.    Clinical Impression   PTA patient modified independent using rollator for mobility, ADLs.  Pt reports living alone and having neighbors, sister in law to assist as needed, reporting "I can call them anytime to come help me".  Patient admitted for above and limited by decreased functional use of R shoulder post surgery, impaired balance, and decreased activity tolerance.  Reviewed shoulder precautions, exercises, sling mgmt and use, positioning, ADL compensatory techniques and safety recommendations.  He requires min assist for grooming, mod assist for UB ADLs, total assist for sling mgmt, mod assist for LB ADLs and min guard for basic transfers. He verbalizes understanding of recommendations to have support with all ADLs, sling mgmt, exercises and mobility at this time.  He will benefit from continued OT services while admitted and after dc at [facility] level in order to maximize independence, safety and return to PLOF.     Follow Up Recommendations  Home health OT;Supervision - Intermittent(supervision for ADLs, mobility, exercises, sling; aide)    Equipment Recommendations  None recommended by OT    Recommendations for Other Services PT consult     Precautions / Restrictions Precautions Precautions: Fall;Shoulder Type of Shoulder Precautions: no shoulder ROM, okay elbow/wrist/hand  Shoulder Interventions: Shoulder sling/immobilizer;Off for dressing/bathing/exercises;At all times Precaution Booklet Issued: Yes (comment) Precaution Comments: reviewed with patient Required Braces or Orthoses: Sling Restrictions Weight Bearing  Restrictions: Yes RUE Weight Bearing: Non weight bearing      Mobility Bed Mobility               General bed mobility comments: EOB upon entry, reviewed positioning of UE when supine   Transfers Overall transfer level: Needs assistance Equipment used: 4-wheeled walker Transfers: Sit to/from Stand Sit to Stand: Min guard         General transfer comment: min guard to steady into standing; increased effort to fully ascend but no physical assist required    Balance Overall balance assessment: Mild deficits observed, not formally tested                                         ADL either performed or assessed with clinical judgement   ADL Overall ADL's : Needs assistance/impaired Eating/Feeding: Set up;Sitting   Grooming: Minimal assistance;Sitting   Upper Body Bathing: Moderate assistance;Sitting;Cueing for UE precautions;Cueing for compensatory techniques   Lower Body Bathing: Minimal assistance;Sit to/from stand;Cueing for compensatory techniques Lower Body Bathing Details (indicate cue type and reason): reviewed use of long sponge for increased ease, and safety bathing seated Upper Body Dressing : Moderate assistance;Sitting;Cueing for UE precautions;Cueing for compensatory techniques Upper Body Dressing Details (indicate cue type and reason): requires assist to manage R UE and shirt; increased time and effort with max cueing to avoid elevation of R shoulder Lower Body Dressing: Moderate assistance;Sit to/from stand;Cueing for compensatory techniques Lower Body Dressing Details (indicate cue type and reason): requires assist to thread B LEs, sit to stand min guard with increased time to manage clothing over hips  Toilet Transfer: Min guard;Ambulation(rollator)   Toileting- Architect and Hygiene: Min  guard;Sit to/from stand       Functional mobility during ADLs: ArboriculturistMin guard(rollator) General ADL Comments: pt educated on shoulder  precautions, ADL compensatory techniques and safety; requires constant cueing for R shoulder relaxation to avoid elevation; pt verbalizes understanding that he will need assist for sling mgmt, ADLs and recommendation of min guard for moblity/transfers at this time      Vision Baseline Vision/History: Wears glasses Wears Glasses: At all times Patient Visual Report: No change from baseline Vision Assessment?: No apparent visual deficits     Perception     Praxis      Pertinent Vitals/Pain Pain Assessment: Faces Faces Pain Scale: Hurts a little bit Pain Location: shoulder, R Pain Descriptors / Indicators: Discomfort;Operative site guarding Pain Intervention(s): Limited activity within patient's tolerance;Monitored during session;Repositioned;Ice applied     Hand Dominance Left   Extremity/Trunk Assessment Upper Extremity Assessment Upper Extremity Assessment: LUE deficits/detail;RUE deficits/detail RUE Deficits / Details: s/p shoulder sx with nerve block intact; PROM e/w/h WFL; no movement for shoulder  RUE: Unable to fully assess due to immobilization RUE Sensation: (nerve block intact ) RUE Coordination: decreased fine motor;decreased gross motor LUE Deficits / Details: WFL  LUE Sensation: WNL LUE Coordination: WNL   Lower Extremity Assessment Lower Extremity Assessment: Defer to PT evaluation       Communication Communication Communication: No difficulties   Cognition Arousal/Alertness: Awake/alert Behavior During Therapy: Anxious Overall Cognitive Status: Within Functional Limits for tasks assessed                                 General Comments: verbose, poor attention to task and requires constant redirection to task but anticipate baseline    General Comments       Exercises Exercises: Shoulder;Other exercises Shoulder Exercises Elbow Flexion: PROM;Right;10 reps;Seated Elbow Extension: PROM;Right;10 reps;Seated Wrist Flexion: PROM;Right;5  reps;Seated Wrist Extension: PROM;Right;5 reps;Seated Digit Composite Flexion: PROM;Right;5 reps;Seated Composite Extension: PROM;Right;5 reps;Seated Other Exercises Other Exercises: PROM Right; 5 reps seated supination/pronation   Shoulder Instructions Shoulder Instructions Donning/doffing shirt without moving shoulder: Moderate assistance Method for sponge bathing under operated UE: Moderate assistance Donning/doffing sling/immobilizer: Maximal assistance Correct positioning of sling/immobilizer: Moderate assistance ROM for elbow, wrist and digits of operated UE: Maximal assistance Sling wearing schedule (on at all times/off for ADL's): Set-up Proper positioning of operated UE when showering: Moderate assistance Positioning of UE while sleeping: Moderate assistance    Home Living Family/patient expects to be discharged to:: Private residence Living Arrangements: Alone Available Help at Discharge: Friend(s);Neighbor;Available PRN/intermittently;Family Type of Home: Mobile home Home Access: Ramped entrance     Home Layout: One level     Bathroom Shower/Tub: Chief Strategy OfficerTub/shower unit   Bathroom Toilet: Standard     Home Equipment: Environmental consultantWalker - 4 wheels;Toilet riser;Grab bars - tub/shower   Additional Comments: pt reports he can call on neighbors, friends and sister in law ANY time he needs assistance      Prior Functioning/Environment Level of Independence: Independent with assistive device(s)        Comments: uses rollator for mobility, independent ADLs with increased time         OT Problem List: Decreased activity tolerance;Decreased range of motion;Impaired balance (sitting and/or standing);Decreased coordination;Decreased knowledge of use of DME or AE;Decreased knowledge of precautions;Pain;Impaired UE functional use;Obesity      OT Treatment/Interventions: Self-care/ADL training;DME and/or AE instruction;Therapeutic activities;Balance training;Patient/family  education;Therapeutic exercise    OT Goals(Current goals can be found in  the care plan section) Acute Rehab OT Goals Patient Stated Goal: to have my right arm move better OT Goal Formulation: With patient Time For Goal Achievement: 11/25/19 Potential to Achieve Goals: Good  OT Frequency: Min 3X/week   Barriers to D/C:            Co-evaluation              AM-PAC OT "6 Clicks" Daily Activity     Outcome Measure Help from another person eating meals?: A Little Help from another person taking care of personal grooming?: A Little Help from another person toileting, which includes using toliet, bedpan, or urinal?: A Lot Help from another person bathing (including washing, rinsing, drying)?: A Lot Help from another person to put on and taking off regular upper body clothing?: A Lot Help from another person to put on and taking off regular lower body clothing?: A Lot 6 Click Score: 14   End of Session Equipment Utilized During Treatment: Rolling walker;Other (comment)(sling) Nurse Communication: Mobility status;Precautions  Activity Tolerance: Patient tolerated treatment well Patient left: with call bell/phone within reach;Other (comment)(seated EOB )  OT Visit Diagnosis: Unsteadiness on feet (R26.81);Pain Pain - Right/Left: Right Pain - part of body: Shoulder                Time: 0037-0488 OT Time Calculation (min): 48 min Charges:  OT General Charges $OT Visit: 1 Visit OT Evaluation $OT Eval Moderate Complexity: 1 Mod OT Treatments $Self Care/Home Management : 8-22 mins $Therapeutic Exercise: 8-22 mins  Delight Stare, OT Acute Rehabilitation Services Pager [phone] Office [phone]   Delight Stare 11/11/2019, 9:16 AM

## 2019-11-11 NOTE — Evaluation (Signed)
Physical Therapy Evaluation Patient Details Name: Kurt [name] MRN: [id number] DOB: [birth year] Today's Date: 11/11/2019   History of Present Illness  Pt is a 68 y/o male with PMH of DM, HTN, osteogeneisis imperfecta, post-polio syndrome, L elbow fx surgery, presenting with longstanding history of R shoulder pain s/p R shoulder reverse shoulder arthroplasty.   Clinical Impression  Patient presents with decreased independence with mobility due to limited use of R UE and prior history of LE weakness and balance issues with high risk for injury if he falls.  Feel he will benefit from skilled PT in the acute setting and follow up HHPT at d/c.      Follow Up Recommendations Home health PT(HH aide)    Equipment Recommendations  None recommended by PT    Recommendations for Other Services       Precautions / Restrictions Precautions Precautions: Fall Shoulder Interventions: Shoulder sling/immobilizer;Off for dressing/bathing/exercises;At all times Required Braces or Orthoses: Sling Restrictions Weight Bearing Restrictions: Yes RUE Weight Bearing: Non weight bearing      Mobility  Bed Mobility Overal bed mobility: Needs Assistance Bed Mobility: Supine to Sit;Sit to Supine     Supine to sit: Supervision;HOB elevated Sit to supine: Min assist   General bed mobility comments: assist for legs onto bed to supine; pt able to scoot up in bed using headboard  Transfers Overall transfer level: Needs assistance Equipment used: 4-wheeled walker Transfers: Sit to/from Stand Sit to Stand: Supervision;From elevated surface         General transfer comment: height of bed adjusted to simulate home; assist for safety  Ambulation/Gait Ambulation/Gait assistance: Supervision Gait Distance (Feet): 150 Feet Assistive device: 4-wheeled walker Gait Pattern/deviations: Step-to pattern;Step-through pattern;Wide base of support;Trunk flexed;Decreased stride length     General Gait  Details: increased time and cautious with flexed posture and wider BOS, adjusting walker with L hand only keeping R in sling  Stairs            Wheelchair Mobility    Modified Rankin (Stroke Patients Only)       Balance Overall balance assessment: Mild deficits observed, not formally tested                                           Pertinent Vitals/Pain Faces Pain Scale: Hurts a little bit Pain Location: shoulder, R Pain Descriptors / Indicators: Discomfort;Operative site guarding Pain Intervention(s): Monitored during session    Home Living Family/patient expects to be discharged to:: Private residence Living Arrangements: Alone Available Help at Discharge: Friend(s);Neighbor;Available PRN/intermittently;Family Type of Home: Mobile home Home Access: Ramped entrance     Home Layout: One level Home Equipment: Walker - 4 wheels;Toilet riser;Grab bars - tub/shower Additional Comments: pt reports he can call on neighbors, friends and sister in law ANY time he needs assistance    Prior Function Level of Independence: Independent with assistive device(s)         Comments: uses rollator for mobility, independent ADLs with increased time      Hand Dominance   Dominant Hand: Left    Extremity/Trunk Assessment   Upper Extremity Assessment Upper Extremity Assessment: Defer to OT evaluation    Lower Extremity Assessment Lower Extremity Assessment: Overall WFL for tasks assessed       Communication   Communication: No difficulties  Cognition Arousal/Alertness: Awake/alert Behavior During Therapy: WFL for tasks assessed/performed Overall Cognitive Status:  Within Functional Limits for tasks assessed                                        General Comments General comments (skin integrity, edema, etc.): reliant on UE support for ambulation    Exercises     Assessment/Plan    PT Assessment Patient needs continued PT  services  PT Problem List Decreased mobility;Decreased balance;Decreased knowledge of use of DME       PT Treatment Interventions DME instruction;Therapeutic activities;Therapeutic exercise;Gait training;Balance training;Functional mobility training    PT Goals (Current goals can be found in the Care Plan section)  Acute Rehab PT Goals Patient Stated Goal: to not fall at home PT Goal Formulation: With patient Time For Goal Achievement: 11/18/19 Potential to Achieve Goals: Good    Frequency Min 5X/week   Barriers to discharge        Co-evaluation               AM-PAC PT "6 Clicks" Mobility  Outcome Measure Help needed turning from your back to your side while in a flat bed without using bedrails?: None Help needed moving from lying on your back to sitting on the side of a flat bed without using bedrails?: A Little Help needed moving to and from a bed to a chair (including a wheelchair)?: A Little Help needed standing up from a chair using your arms (e.g., wheelchair or bedside chair)?: None Help needed to walk in hospital room?: A Little Help needed climbing 3-5 steps with a railing? : A Little 6 Click Score: 20    End of Session Equipment Utilized During Treatment: Other (comment)(sling) Activity Tolerance: Patient tolerated treatment well Patient left: in bed;with call bell/phone within reach   PT Visit Diagnosis: Difficulty in walking, not elsewhere classified (R26.2)    Time: 3785-8850 PT Time Calculation (min) (ACUTE ONLY): 26 min   Charges:   PT Evaluation $PT Eval Moderate Complexity: 1 Mod PT Treatments $Gait Training: 8-22 mins        Magda Kiel, [place] [phone] 11/11/2019   Reginia Naas 11/11/2019, 2:02 PM

## 2019-11-11 NOTE — TOC Transition Note (Addendum)
Transition of Care [facility]) - CM/SW Discharge Note   Patient Details  Name: Kurt [name] MRN: [id number] Date of Birth: [birth year]  Transition of [facility]) CM/SW Contact:  Sharin Mons, RN Phone Number: 11/11/2019, 10:26 AM   Clinical Narrative:   - s/p R shoulder reverse shoulder arthroplasty, 11/11/2019. PMH of DM, HTN, osteogeneisis imperfecta, post-polio syndrome, L elbow fx surgery.  Pt will transition to home today. Home health services in place. SOC will begin early next week per [facility] liaison. Pt made aware and is ok with SOC date. States will have good support from family and neighbors once d/c.  Pt with transportation to home.  Final next level of care: [place] Barriers to Discharge: No Barriers Identified   Patient Goals and CMS Choice Patient states their goals for this hospitalization and ongoing recovery are:: To go home CMS Medicare.gov Compare Post Acute Care list provided to:: Patient Choice offered to / list presented to : Patient  Discharge Placement   Discharge Plan and Services                DME Arranged: N/A DME Agency: NA       HH Arranged: PT, OT, Nurse's Aide [place] Agency: [place] ([facility]) Date HH Agency Contacted: 11/11/19 Time [place]: 1026 Representative spoke with at Loop: [place] ([place]) Interventions     Readmission Risk Interventions No flowsheet data found.

## 2019-11-12 LAB — GLUCOSE, CAPILLARY: Glucose-Capillary: 169 mg/dL — ABNORMAL HIGH (ref 70–99)

## 2019-11-12 NOTE — Progress Notes (Signed)
Patient alert and oriented, mae's well, voiding adequate amount of urine, swallowing without difficulty,  c/o pain at time of discharge and medication given prior to going home. Patient discharged home with family. Script and discharged instructions given to patient. Patient and family stated understanding of instructions given. Patient has an appointment with Dr. Marlou Sa.

## 2019-11-12 NOTE — Progress Notes (Signed)
Occupational Therapy Treatment Patient Details Name: Kurt [name] MRN: [id number] DOB: 03/23/1951 Today's Date: 11/12/2019    History of present illness Pt is a 68 y/o male with PMH of DM, HTN, osteogeneisis imperfecta, post-polio syndrome, L elbow fx surgery, presenting with longstanding history of R shoulder pain s/p R shoulder reverse shoulder arthroplasty.    OT comments  Patient seated EOB and agreeable to OT.  Reviewed sling mgmt, ADLs, exercises and safety. Good recall of precautions and sling wear schedule. Mod assist for sling mgmt, but patient able to verbalize technique to guide caregiver assist with increased time.  Increased pain today as nerve block has worn off, able to complete exercises to elbow, wrist, hand as below (AAROM to AROM).  Patient aware of need for assistance for ADLs, sling mgmt, IADLs, and mobility at this time and reports he will have assistance throughout the day.  Continue to recommend HHOT and aide support.    Follow Up Recommendations  Home health OT;Supervision/Assistance - 24 hour(supervision for ADLs, mobility, exerises, sling; aide)    Equipment Recommendations  None recommended by OT    Recommendations for Other Services PT consult    Precautions / Restrictions Precautions Precautions: Fall Type of Shoulder Precautions: no shoulder ROM, okay elbow/wrist/hand  Shoulder Interventions: Shoulder sling/immobilizer;Off for dressing/bathing/exercises;At all times Precaution Booklet Issued: Yes (comment) Precaution Comments: reviewed with patient Required Braces or Orthoses: Sling Restrictions Weight Bearing Restrictions: Yes RUE Weight Bearing: Non weight bearing       Mobility Bed Mobility               General bed mobility comments: EOB upon entry  Transfers                      Balance                                           ADL either performed or assessed with clinical judgement   ADL Overall  ADL's : Needs assistance/impaired         Upper Body Bathing: Moderate assistance;Sitting;Cueing for UE precautions;Cueing for compensatory techniques Upper Body Bathing Details (indicate cue type and reason): pt able to verbalize techniques to bathe UE, how to hold R UE and parts he will need assist from caregiver with     Upper Body Dressing : Moderate assistance;Sitting;Cueing for compensatory techniques Upper Body Dressing Details (indicate cue type and reason): pt able to verbalize technique, limited by pain today; will require assist from caregiver                   General ADL Comments: session focused on R UE exericses and sling mgmt      Vision       Perception     Praxis      Cognition Arousal/Alertness: Awake/alert Behavior During Therapy: WFL for tasks assessed/performed Overall Cognitive Status: Within Functional Limits for tasks assessed                                          Exercises Exercises: Shoulder;Other exercises Shoulder Exercises Elbow Flexion: Right;10 reps;Seated;AAROM Elbow Extension: Right;10 reps;Seated;AAROM Wrist Flexion: Right;5 reps;Seated;AROM Wrist Extension: Right;5 reps;Seated;AROM Digit Composite Flexion: Right;5 reps;Seated;AROM Composite Extension: Right;5 reps;Seated;AROM Other Exercises Other Exercises: AAROM Right;  5 reps seated supination/pronation   Shoulder Instructions Shoulder Instructions Donning/doffing shirt without moving shoulder: Moderate assistance;Patient able to independently direct caregiver Method for sponge bathing under operated UE: Moderate assistance;Patient able to independently direct caregiver Donning/doffing sling/immobilizer: Moderate assistance;Patient able to independently direct caregiver Correct positioning of sling/immobilizer: Moderate assistance;Patient able to independently direct caregiver ROM for elbow, wrist and digits of operated UE: Moderate assistance;Patient able  to independently direct caregiver Sling wearing schedule (on at all times/off for ADL's): Modified independent Proper positioning of operated UE when showering: Minimal assistance     General Comments pt able to verbalize need for assist with all ADLs, mobility and exericses--reports his sister in law or friends/neighbors will asist    Pertinent Vitals/ Pain       Pain Assessment: Faces Faces Pain Scale: Hurts even more Pain Location: shoulder, R Pain Descriptors / Indicators: Discomfort;Operative site guarding;Grimacing Pain Intervention(s): Monitored during session;Repositioned  Home Living                                          Prior Functioning/Environment              Frequency  Min 3X/week        Progress Toward Goals  OT Goals(current goals can now be found in the care plan section)  Progress towards OT goals: Progressing toward goals  Acute Rehab OT Goals Patient Stated Goal: to not fall at home OT Goal Formulation: With patient  Plan Discharge plan remains appropriate;Frequency remains appropriate    Co-evaluation                 AM-PAC OT "6 Clicks" Daily Activity     Outcome Measure   Help from another person eating meals?: A Little Help from another person taking care of personal grooming?: A Little Help from another person toileting, which includes using toliet, bedpan, or urinal?: A Lot Help from another person bathing (including washing, rinsing, drying)?: A Lot Help from another person to put on and taking off regular upper body clothing?: A Lot Help from another person to put on and taking off regular lower body clothing?: A Lot 6 Click Score: 14    End of Session Equipment Utilized During Treatment: Rolling walker;Other (comment)(sling)  OT Visit Diagnosis: Unsteadiness on feet (R26.81);Pain Pain - Right/Left: Right Pain - part of body: Shoulder   Activity Tolerance Patient tolerated treatment well   Patient  Left with call bell/phone within reach;Other (comment)(seated EOB )   Nurse Communication Mobility status;Precautions        Time: 4944-9675 OT Time Calculation (min): 20 min  Charges: OT General Charges $OT Visit: 1 Visit OT Treatments $Self Care/Home Management : 8-22 mins  Delight Stare, [name] Pager [phone] Office [phone]    Delight Stare 11/12/2019, 8:34 AM

## 2019-11-12 NOTE — Progress Notes (Signed)
  Subjective: Kurt [name] is a 68 y.o. male s/p right RSA.  They are POD2.  Pt's pain is moderate but overall controlled. His block has worn out and he states that his hand feels normal with regards to sensation and motor function.  He has not had a BM yet.    Objective: Vital signs in last 24 hours: Temp:  [97.6 F (36.4 C)-99.3 F (37.4 C)] 97.9 F (36.6 C) (11/26 0722) Pulse Rate:  [86-97] 87 (11/26 0722) Resp:  [16-20] 18 (11/26 0722) BP: (138-166)/(79-90) 153/87 (11/26 0722) SpO2:  [95 %-99 %] 97 % (11/26 0722)  Intake/Output from previous day: 11/25 0701 - 11/26 0700 In: 960 [P.O.:960] Out: 500 [Urine:500] Intake/Output this shift: No intake/output data recorded.  Exam:  No gross blood or drainage overlying the dressing 2+ radial pulse Sensation intact distally in the right hand both palmarly and dorsally Able to extend the right wrist.  EPL, IP flexion, Finger abduction intact   Labs: No results for input(s): HGB in the last 72 hours. No results for input(s): WBC, RBC, HCT, PLT in the last 72 hours. No results for input(s): NA, K, CL, CO2, BUN, CREATININE, GLUCOSE, CALCIUM in the last 72 hours. No results for input(s): LABPT, INR in the last 72 hours.  Assessment/Plan: Pt is POD2 s/p right RSA    -Plan to discharge to home today   -No lifting with the operative arm  -Sling immobilization  -Patient will f/u with Dr. Marlou Sa in clinic in 2 weeks     [place] 11/12/2019, 7:49 AM

## 2019-11-16 ENCOUNTER — Telehealth: Payer: Self-pay

## 2019-11-16 NOTE — Telephone Encounter (Signed)
IC verbal given.  

## 2019-11-16 NOTE — Telephone Encounter (Signed)
Ben physical therapist with [facility] would like verbal orders for HHPT for 2 x week for 4 weeks.  CB# [phone].  Please advise.  Thank you.

## 2019-11-24 NOTE — Discharge Summary (Signed)
Physician Discharge Summary      Patient ID: Kurt [name] MRN: [id number] DOB/AGE: 06/28/1951 68 y.o.  Admit date: 11/10/2019 Discharge date: 11/12/2019  Admission Diagnoses:  Active Problems:   Arthritis of shoulder   Discharge Diagnoses:  Same  Surgeries: Procedure(s): RIGHT REVERSE SHOULDER ARTHROPLASTY on 11/10/2019   Consultants:   Discharged Condition: Stable  Hospital Course: Kurt [name] is an 68 y.o. 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  They were brought to the operating room on 11/10/2019 and underwent the above named procedures.  Pt awoke from anesthesia without complication and was transferred to the floor. Due to patient's history of osteogenesis imperfecta, a postoperative xray was obtained which revealed no intraoperative fractures. On POD1, patient was doing well and his block was still in effect.  He did well in therapy but requested to stay another day for fear of pain control.  On POD2, patient had acute pain onset with resolution of the perioperative interscalene block.  His pain was more manageable throughout POD2 and he was subsequently discharged home.  Pt will f/u with Dr. Marlou Sa in clinic in ~2 weeks.   Antibiotics given:  Anti-infectives (From admission, onward)   Start     Dose/Rate Route Frequency Ordered Stop   11/10/19 1830  vancomycin (VANCOCIN) IVPB 1000 mg/200 mL premix     1,000 mg 200 mL/hr over 60 Minutes Intravenous Every 12 hours 11/10/19 1253 11/10/19 1928   11/10/19 1141  vancomycin (VANCOCIN) powder  Status:  Discontinued       As needed 11/10/19 1141 11/10/19 1141   11/10/19 0615  vancomycin (VANCOCIN) IVPB 1000 mg/200 mL premix     1,000 mg 200 mL/hr over 60 Minutes Intravenous On call to O.R. 11/10/19 0600 11/10/19 0730    .  Recent vital signs:  Vitals:   11/12/19 0349 11/12/19 0722  BP: 138/87 (!) 153/87  Pulse:  86 87  Resp: 20 18  Temp: 98 F (36.7 C) 97.9 F (36.6 C)  SpO2: 96% 97%    Recent laboratory studies:  Results for orders placed or performed during the hospital encounter of 11/10/19  Glucose, capillary  Result Value Ref Range   Glucose-Capillary 103 (H) 70 - 99 mg/dL  Glucose, capillary  Result Value Ref Range   Glucose-Capillary 157 (H) 70 - 99 mg/dL  Glucose, capillary  Result Value Ref Range   Glucose-Capillary 287 (H) 70 - 99 mg/dL   Comment 1 Notify RN    Comment 2 Document in Chart   Glucose, capillary  Result Value Ref Range   Glucose-Capillary 260 (H) 70 - 99 mg/dL   Comment 1 Notify RN    Comment 2 Document in Chart   Glucose, capillary  Result Value Ref Range   Glucose-Capillary 161 (H) 70 - 99 mg/dL   Comment 1 Notify RN    Comment 2 Document in Chart   Glucose, capillary  Result Value Ref Range   Glucose-Capillary 139 (H) 70 - 99 mg/dL   Comment 1 Notify RN    Comment 2 Document in Chart   Glucose, capillary  Result Value Ref Range   Glucose-Capillary 219 (H) 70 - 99 mg/dL   Comment 1 Notify RN    Comment 2 Document in Chart   Glucose, capillary  Result Value Ref Range   Glucose-Capillary 191 (H) 70 - 99 mg/dL   Comment 1 Notify RN  Comment 2 Document in Chart   Glucose, capillary  Result Value Ref Range   Glucose-Capillary 169 (H) 70 - 99 mg/dL   Comment 1 Notify RN    Comment 2 Document in Chart     Discharge Medications:   Allergies as of 11/12/2019      Reactions   Augmentin [amoxicillin-pot Clavulanate] Nausea And Vomiting   Iodine Hives   Tape Rash   Paper       Medication List    STOP taking these medications   HYDROcodone-acetaminophen 7.5-325 MG tablet Commonly known as: NORCO     TAKE these medications   acetaminophen 650 MG CR tablet Commonly known as: TYLENOL Take 1,300 mg by mouth every 8 (eight) hours as needed for pain.   aspirin 81 MG chewable tablet Chew 1 tablet (81 mg total) by mouth daily.   glipiZIDE 10  MG 24 hr tablet Commonly known as: GLUCOTROL XL Take 10 mg by mouth at bedtime.   Janumet XR [id number] MG Tb24 Generic drug: SitaGLIPtin-MetFORMIN HCl Take 1 tablet by mouth at bedtime.   Lantus SoloStar 100 UNIT/ML Solostar Pen Generic drug: Insulin Glargine Inject 33 Units into the skin at bedtime.   lisinopril 20 MG tablet Commonly known as: ZESTRIL Take 20 mg by mouth daily.   methocarbamol 500 MG tablet Commonly known as: ROBAXIN Take 500 mg by mouth at bedtime.   Olopatadine HCl 0.7 % Soln Apply to eye.   oxyCODONE 5 MG immediate release tablet Commonly known as: Oxy IR/ROXICODONE Take 1 tablet (5 mg total) by mouth every 4 (four) hours as needed for moderate pain (pain score 4-6).   simvastatin 20 MG tablet Commonly known as: ZOCOR Take 20 mg by mouth every evening.       Diagnostic Studies: Dg Shoulder Right Port  Result Date: 11/10/2019 CLINICAL DATA:  Patient status post right shoulder replacement today. EXAM: PORTABLE RIGHT SHOULDER COMPARISON:  Plain films right shoulder 12/25/2018. FINDINGS: New reverse shoulder arthroplasty is in place. No acute abnormality is identified. Gas in the soft tissues from surgery noted. IMPRESSION: Status post reverse right shoulder replacement.  No acute finding. Electronically Signed   By: Drusilla Kanner M.D.   On: 11/10/2019 12:32    Disposition:   Discharge Instructions    Call MD / Call 911   Complete by: As directed    If you experience chest pain or shortness of breath, CALL 911 and be transported to the hospital emergency room.  If you develope a fever above 101 F, pus (white drainage) or increased drainage or redness at the wound, or calf pain, call your surgeon's office.   Constipation Prevention   Complete by: As directed    Drink plenty of fluids.  Prune juice may be helpful.  You may use a stool softener, such as Colace (over the counter) 100 mg twice a day.  Use MiraLax (over the counter) for constipation as  needed.   Diet - low sodium heart healthy   Complete by: As directed    Discharge instructions   Complete by: As directed    You may shower, dressing is waterproof.  Do not bathe or soak the operative shoulder in a tub, pool.  Use the CPM machine 3 times a day for one hour each time. If you do not have a CPM machine, reach out to the office and we will arrange for CPM vendor to contact you regarding rental/purchase.  No lifting with the operative shoulder. Continue use of  the sling.  Follow-up with Dr. August Saucerean in ~2 weeks on your given appointment date.  We will remove your adhesive bandage at that time.   Increase activity slowly as tolerated   Complete by: As directed       Follow-up Information    [facility] Follow up.   Why: Home health services arranged, PT,OT, NA           Signed: Julieanne CottonCharles L Ranette [place] 11/24/2019, 9:12 PM

## 2019-11-25 ENCOUNTER — Ambulatory Visit (INDEPENDENT_AMBULATORY_CARE_PROVIDER_SITE_OTHER): Payer: Medicare Other

## 2019-11-25 ENCOUNTER — Ambulatory Visit (INDEPENDENT_AMBULATORY_CARE_PROVIDER_SITE_OTHER): Payer: Medicare Other | Admitting: Orthopedic Surgery

## 2019-11-25 ENCOUNTER — Encounter: Payer: Self-pay | Admitting: Orthopedic Surgery

## 2019-11-25 ENCOUNTER — Other Ambulatory Visit: Payer: Self-pay

## 2019-11-25 DIAGNOSIS — M19011 Primary osteoarthritis, right shoulder: Secondary | ICD-10-CM

## 2019-11-25 DIAGNOSIS — Z96611 Presence of right artificial shoulder joint: Secondary | ICD-10-CM

## 2019-11-25 MED ORDER — ASPIRIN 81 MG PO CHEW: 81.0000 mg | CHEWABLE_TABLET | Freq: Every day | ORAL | 0 refills | Status: AC

## 2019-11-25 MED ORDER — OXYCODONE HCL 5 MG PO TABS: 5.0000 mg | ORAL_TABLET | Freq: Three times a day (TID) | ORAL | 0 refills | Status: DC | PRN

## 2019-11-27 ENCOUNTER — Encounter: Payer: Self-pay | Admitting: Orthopedic Surgery

## 2019-11-27 NOTE — Progress Notes (Signed)
Post-Op Visit Note   Patient: Kurt [name]           Date of Birth: 10-30-1951           MRN: [id number] Visit Date: 11/25/2019 PCP: Joyice Faster, FNP   Assessment & Plan:  Chief Complaint:  Chief Complaint  Patient presents with  . Right Shoulder - Routine Post Op   Visit Diagnoses:  1. Status post reverse total replacement of right shoulder     Plan: Patient is a 68 year old male who presents s/p right reverse shoulder arthroplasty on 11/10/2019.  Patient states that he is doing well overall and is up to 97 degrees on CPM machine.  He has been compliant with remaining in his sling.  Incision looks good on exam today.  He has been taking Tylenol for pain as well as oxycodone every 4 hours.  He has been compliant with aspirin but is out.  On exam his subscapularis has some weakness which is to be expected at this point.  He also has 75 degrees of abduction and 90 degrees of forward flexion.  He requests to continue home physical therapy as it will be too difficult to get to an outpatient physical therapy location.  He is okay for passive range of motion and active range of motion as he can tolerate.  No lifting greater than 10 pounds.  He will receive physical therapy 2 times a week for 4 more weeks.  Patient will discontinue the sling today.  Refilled oxycodone to take every 8 hours as needed.  X-rays look good today and show a well-positioned prosthesis with no complicating features.  He will follow-up in 4 weeks.  Follow-Up Instructions: No follow-ups on file.   Orders:  Orders Placed This Encounter  Procedures  . XR Shoulder Right   Meds ordered this encounter  Medications  . oxyCODONE (OXY IR/ROXICODONE) 5 MG immediate release tablet    Sig: Take 1 tablet (5 mg total) by mouth every 8 (eight) hours as needed for moderate pain (pain score 4-6).    Dispense:  40 tablet    Refill:  0  . aspirin 81 MG chewable tablet    Sig: Chew 1 tablet (81 mg total) by mouth daily.     Dispense:  30 tablet    Refill:  0    Imaging: No results found.  PMFS History: Patient Active Problem List   Diagnosis Date Noted  . 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 Cellulitis 02/24/2019  . 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 Osteogenesis imperfecta 12/25/2018  . Diabetes mellitus without complication ([place]) 76/16/0737   Past Medical History:  Diagnosis Date  . Arthritis   . Bronchitis   . Bronchitis   . Concussion    late 1990's  after a fall  . Diabetes mellitus without complication ([place])   . GERD (gastroesophageal reflux disease)   . History of kidney stones   . Hypertension   . Osteogenesis imperfecta   . PONV (postoperative nausea and vomiting)    pt has post polio syndrome  . Post-polio syndrome     Family History  Problem Relation Age of Onset  . Hypertension Mother   . Diabetes Brother     Past Surgical History:  Procedure Laterality Date  . ANKLE FRACTURE SURGERY Bilateral   . arm surgery Right    nerve surgery  . COLONOSCOPY    . ELBOW FRACTURE SURGERY Left   . EYE MUSCLE SURGERY Left   . LEG  SURGERY Left    femur fracture with rod  . REVERSE SHOULDER ARTHROPLASTY Right 11/10/2019   Procedure: RIGHT REVERSE SHOULDER ARTHROPLASTY;  Surgeon: Cammy Copa, MD;  Location: [facility] OR;  Service: Orthopedics;  Laterality: Right;  . TONSILLECTOMY     Social History   Occupational History  . Not on file  Tobacco Use  . Smoking status: Never Smoker  . Smokeless tobacco: Never Used  Substance and Sexual Activity  . Alcohol use: Yes    Comment: 1 beer occasionally  . Drug use: No  . Sexual activity: Not on file

## 2019-12-24 ENCOUNTER — Ambulatory Visit (INDEPENDENT_AMBULATORY_CARE_PROVIDER_SITE_OTHER): Payer: Medicare Other | Admitting: Orthopedic Surgery

## 2019-12-24 ENCOUNTER — Other Ambulatory Visit: Payer: Self-pay

## 2019-12-24 DIAGNOSIS — Z96611 Presence of right artificial shoulder joint: Secondary | ICD-10-CM

## 2019-12-25 ENCOUNTER — Encounter: Payer: Self-pay | Admitting: Orthopedic Surgery

## 2019-12-25 NOTE — Progress Notes (Signed)
   Post-Op Visit Note   Patient: Kurt [name]           Date of Birth: May 23, 1951           MRN: [id number] Visit Date: 12/24/2019 PCP: Altamease Oiler, FNP   Assessment & Plan:  Chief Complaint:  Chief Complaint  Patient presents with  . Right Shoulder - Follow-up   Visit Diagnoses:  1. Status post reverse total replacement of right shoulder     Plan: [name] is a patient who is now about 6 weeks out reverse shoulder replacement on the right.  He is on 120 on CPM machine.  Doing therapy at [facility].  On exam he has very good active and passive range of motion to 90 degrees.  Deltoid is functional.  Incision intact.  He is back on Norco from primary care.  6-week return for clinical recheck on range of motion.  I think he prefers to do most of his home exercises at home and not in therapy but he needs therapy to help him.  Follow-Up Instructions: Return in about 6 weeks (around 02/04/2020).   Orders:  Orders Placed This Encounter  Procedures  . Ambulatory referral to Occupational Therapy   No orders of the defined types were placed in this encounter.   Imaging: No results found.  PMFS History: Patient Active Problem List   Diagnosis Date Noted  . 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 Cellulitis 02/24/2019  . 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 Osteogenesis imperfecta 12/25/2018  . Diabetes mellitus without complication (H[facility]) 12/25/2018   Past Medical History:  Diagnosis Date  . Arthritis   . Bronchitis   . Bronchitis   . Concussion    late 1990's  after a fall  . Diabetes mellitus without complication ([facility])   . GERD (gastroesophageal reflux disease)   . History of kidney stones   . Hypertension   . Osteogenesis imperfecta   . PONV (postoperative nausea and vomiting)    pt has post polio syndrome  . Post-polio syndrome     Family History  Problem Relation Age of Onset  . Hypertension Mother   . Diabetes Brother     Past Surgical History:  Procedure  Laterality Date  . ANKLE FRACTURE SURGERY Bilateral   . arm surgery Right    nerve surgery  . COLONOSCOPY    . ELBOW FRACTURE SURGERY Left   . EYE MUSCLE SURGERY Left   . LEG SURGERY Left    femur fracture with rod  . REVERSE SHOULDER ARTHROPLASTY Right 11/10/2019   Procedure: RIGHT REVERSE SHOULDER ARTHROPLASTY;  Surgeon: Cammy Copa, MD;  Location: [facility] OR;  Service: Orthopedics;  Laterality: Right;  . TONSILLECTOMY     Social History   Occupational History  . Not on file  Tobacco Use  . Smoking status: Never Smoker  . Smokeless tobacco: Never Used  Substance and Sexual Activity  . Alcohol use: Yes    Comment: 1 beer occasionally  . Drug use: No  . Sexual activity: Not on file

## 2020-01-04 NOTE — Progress Notes (Signed)
[facility] Note  01/05/2020     CHIEF COMPLAINT Patient presents for Diabetic Eye Exam   HISTORY OF PRESENT ILLNESS: Kurt [name] is a 69 y.o. male who presents to the clinic today for:   HPI    Diabetic Eye Exam    Vision is blurred for distance.  Associated Symptoms Shoulder/Hip pain.  Negative for Flashes, Pain, Trauma, Fever, Weight Loss, Scalp Tenderness, Redness, Floaters, Distortion, Photophobia, Jaw Claudication, Fatigue, Glare and Blind Spot.  Diabetes characteristics include Type 2 and taking oral medications.  This started 10 years ago.  Blood sugar level fluctuates.  Last Blood Glucose 102.  Last A1C 6.6.  I, the attending physician,  performed the HPI with the patient and updated documentation appropriately.          Comments    Ret eval per Dr.Spencer. Patient states Dr.Spencer wanted him to have his eyes checked due to possible Diabetic Retinopathy. Most recent a1c 6.6% on 11.20.2020       Last edited by Bernarda Caffey, MD on 01/05/2020  4:52 PM. (History)    Patient states has to turn head to see clearly out of OD. Patient is diabetic and states that he has had polio in the past.    Referring physician: Gevena Cotton, MD [address] [place],  [place] [address]  HISTORICAL INFORMATION:   Selected notes from the MEDICAL RECORD NUMBER Diabetic Eval per Dr. Frederico Hamman   CURRENT MEDICATIONS: Current Outpatient Medications (Ophthalmic Drugs)  Medication Sig  . Olopatadine HCl 0.7 % SOLN Apply to eye.   No current facility-administered medications for this visit. (Ophthalmic Drugs)   Current Outpatient Medications (Other)  Medication Sig  . acetaminophen (TYLENOL) 650 MG CR tablet Take 1,300 mg by mouth every 8 (eight) hours as needed for pain.  Marland Kitchen aspirin 81 MG chewable tablet Chew 1 tablet (81 mg total) by mouth daily.  Marland Kitchen glipiZIDE (GLUCOTROL XL) 10 MG 24 hr tablet Take 10 mg by mouth at bedtime.   Marland Kitchen LANTUS  SOLOSTAR 100 UNIT/ML Solostar Pen Inject 33 Units into the skin at bedtime.   Marland Kitchen lisinopril (PRINIVIL,ZESTRIL) 20 MG tablet Take 20 mg by mouth daily.  . methocarbamol (ROBAXIN) 500 MG tablet Take 500 mg by mouth at bedtime.   Marland Kitchen oxyCODONE (OXY IR/ROXICODONE) 5 MG immediate release tablet Take 1 tablet (5 mg total) by mouth every 8 (eight) hours as needed for moderate pain (pain score 4-6).  Marland Kitchen simvastatin (ZOCOR) 20 MG tablet Take 20 mg by mouth every evening.  . SitaGLIPtin-MetFORMIN HCl (JANUMET XR) [phone] MG TB24 Take 1 tablet by mouth at bedtime.   No current facility-administered medications for this visit. (Other)      REVIEW OF SYSTEMS: ROS    Positive for: Gastrointestinal, Musculoskeletal, Endocrine, Eyes   Negative for: Constitutional, Neurological, Skin, Genitourinary, HENT, Cardiovascular, Respiratory, Psychiatric, Allergic/Imm, Heme/Lymph   Last edited by Elmore Guise, COT on 01/05/2020  1:17 PM. (History)       ALLERGIES Allergies  Allergen Reactions  . Augmentin [Amoxicillin-Pot Clavulanate] Nausea And Vomiting  . Iodine Hives  . Tape Rash    Paper     PAST MEDICAL HISTORY Past Medical History:  Diagnosis Date  . Arthritis   . Bronchitis   . Bronchitis   . Concussion    late 1990's  after a fall  . Diabetes mellitus without complication ([place])   . GERD (gastroesophageal reflux disease)   . History of  kidney stones   . Hypertension   . Osteogenesis imperfecta   . PONV (postoperative nausea and vomiting)    pt has post polio syndrome  . Post-polio syndrome    Past Surgical History:  Procedure Laterality Date  . ANKLE FRACTURE SURGERY Bilateral   . arm surgery Right    nerve surgery  . COLONOSCOPY    . ELBOW FRACTURE SURGERY Left   . EYE MUSCLE SURGERY Left   . LEG SURGERY Left    femur fracture with rod  . REVERSE SHOULDER ARTHROPLASTY Right 11/10/2019   Procedure: RIGHT REVERSE SHOULDER ARTHROPLASTY;  Surgeon: Meredith Pel, MD;  Location:  [facility];  Service: Orthopedics;  Laterality: Right;  . TONSILLECTOMY      FAMILY HISTORY Family History  Problem Relation Age of Onset  . Hypertension Mother   . Diabetes Brother     SOCIAL HISTORY Social History   Tobacco Use  . Smoking status: Never Smoker  . Smokeless tobacco: Never Used  Substance Use Topics  . Alcohol use: Yes    Comment: 1 beer occasionally  . Drug use: No         OPHTHALMIC EXAM:  Base Eye Exam    Visual Acuity (Snellen - Linear)      Right Left   Dist cc 20/40-1 20/25   Dist ph cc 20/NI 20/NI   Correction: Glasses       Tonometry (Tonopen, 1:20 PM)      Right Left   Pressure 14 16       Pupils      Dark Light Shape React APD   Right 3 2 Round Brisk None   Left 3 2 Round Brisk None       Visual Fields (Counting fingers)      Left Right    Full Full       Extraocular Movement      Right Left    Full, Ortho Full, Ortho       Neuro/Psych    Oriented x3: Yes   Mood/Affect: Normal       Dilation    Both eyes: 1.0% Mydriacyl, 2.5% Phenylephrine @ 1:20 PM        Slit Lamp and Fundus Exam    Slit Lamp Exam      Right Left   Lids/Lashes Dermatochalasis - upper lid, Dermatochalasis - lower lid, mild Meibomian gland dysfunction Dermatochalasis - upper lid, Dermatochalasis - lower lid, mild Meibomian gland dysfunction   Conjunctiva/Sclera blue sclera blue sclera   Cornea arcus arcus   Anterior Chamber deep and clear deep and clear   Iris round and dilated, no NVI round and dilated, no NVI   Lens 2+ NS, 2+CS 2+ NS, 2+CS   Vitreous mild syneresis, PVD mild syneresis       Fundus Exam      Right Left   Disc pink and sharp pink and sharp   C/D Ratio 0.3 0.3   Macula blunted foveal reflex, focal edema w/ IRH, MA, and CWS superonasal macula flat, good foveal reflex, mild RPE mottling and clumping, +drusen   Vessels mild attenuation, +A/V crossing changes, focal BRVO superior macula mild attenuation and tortuosity   Periphery  attached, no heme attached, no heme        Refraction    Wearing Rx      Sphere Cylinder Axis Add   Right -2.00 +2.25 111 +2.75   Left -1.50 +1.50 094 2.75  Manifest Refraction (Auto)      Sphere Cylinder Axis Dist VA   Right -0.25 +1.50 096 20/25-2   Left -0.50 +1.25 096 20/20-1          IMAGING AND PROCEDURES  Imaging and Procedures for @TODAY @  OCT, Retina - OU - Both Eyes       Right Eye Quality was good. Central Foveal Thickness: 317. Progression has no prior data. Findings include abnormal foveal contour, no SRF, intraretinal fluid, retinal drusen  (Focal CME superonasal macula).   Left Eye Quality was good. Central Foveal Thickness: 257. Progression has no prior data. Findings include normal foveal contour, no IRF, no SRF, vitreomacular adhesion , retinal drusen .   Notes *Images captured and stored on drive  Diagnosis / Impression:  OD: BRVO with focal CME superonasal macula OS: NFP, no SRF/IRF   Clinical management:  See below  Abbreviations: NFP - Normal foveal profile. CME - cystoid macular edema. PED - pigment epithelial detachment. IRF - intraretinal fluid. SRF - subretinal fluid. EZ - ellipsoid zone. ERM - epiretinal membrane. ORA - outer retinal atrophy. ORT - outer retinal tubulation. SRHM - subretinal hyper-reflective material         Intravitreal Injection, Pharmacologic Agent - OD - Right Eye       Time Out 01/05/2020. 3:20 PM. Confirmed correct patient, procedure, site, and patient consented.   Anesthesia Topical anesthesia was used. Anesthetic medications included Lidocaine 2%, Proparacaine 0.5%.   Procedure Preparation included 5% betadine to ocular surface, eyelid speculum. A 30 gauge needle was used.   Injection:  1.25 mg Bevacizumab (AVASTIN) SOLN   NDC: 47425-956-38, Lot: [id number] , Expiration date: 03/16/2020   Route: Intravitreal, Site: Right Eye, Waste: 0 mL  Post-op Post injection exam found visual acuity of at  least counting fingers. The patient tolerated the procedure well. There were no complications. The patient received written and verbal post procedure care education.                 ASSESSMENT/PLAN:    ICD-10-CM   1. Branch retinal vein occlusion of right eye with macular edema  H34.8310 Intravitreal Injection, Pharmacologic Agent - OD - Right Eye    Bevacizumab (AVASTIN) SOLN 1.25 mg  2. Retinal edema  H35.81 OCT, Retina - OU - Both Eyes  3. Diabetes mellitus type 2 without retinopathy ([place])  E11.9   4. Essential hypertension  I10   5. Hypertensive retinopathy of both eyes  H35.033   6. Combined forms of age-related cataract of both eyes  H25.813     1,2. BRVO with CME OD - The natural history of retinal vein occlusion and macular edema and treatment options including observation, laser photocoagulation, and intravitreal antiVEGF injection with Avastin and Lucentis and Eylea and intravitreal injection of steroids with triamcinolone and Ozurdex and the complications of these procedures including loss of vision, infection, cataract, glaucoma, and retinal detachment were discussed with patient. - Specifically discussed findings from [place] / [place] study regarding patient stabilization with anti-VEGF agents and increased potential for visual improvements.  Also discussed need for frequent follow up and potentially multiple injections given the chronic nature of the disease process - BCVA 20/25-2, today 01.19.21 - exam with focal edema, IRH, CWS superonasal macula - OCT shows BRVO with focal CME superonasal macula - recommend IVA OD #1 today, 01.19.21 - RBA of procedure discussed, questions answered - informed consent obtained and signed - see procedure note - F/U 4 weeks -- DFE/OCT/possible injection  3.  Diabetes mellitus, type 2 without retinopathy OU - The incidence, risk factors for progression, natural history and treatment options for diabetic retinopathy  were discussed with  patient.   - The need for close monitoring of blood glucose, blood pressure, and serum lipids, avoiding cigarette or any type of tobacco, and the need for long term follow up was also discussed with patient. - f/u in 1 year, sooner pr  4,5. Hypertensive retinopathy OU - discussed importance of tight BP control - monitor  6. Age related cataracts OU  - The symptoms of cataract, surgical options, and treatments and risks were discussed with patient. - discussed diagnosis and progression - not yet visually significant - monitor for now   Ophthalmic Meds Ordered this visit:  Meds ordered this encounter  Medications  . Bevacizumab (AVASTIN) SOLN 1.25 mg       Return in 4 weeks (on 02/02/2020) for f/u BRVO OS - DFE, OCT, Possible Injxn.  There are no Patient Instructions on file for this visit.   Explained the diagnoses, plan, and follow up with the patient and they expressed understanding.  Patient expressed understanding of the importance of proper follow up care.   This document serves as a record of services personally performed by Gardiner Sleeper, MD, PhD. It was created on their behalf by Roselee Nova, COMT. The creation of this record is the provider's dictation and/or activities during the visit.  Electronically signed by: Roselee Nova, COMT 01/05/20 4:58 PM  Gardiner Sleeper, M.D., Ph.D. Diseases & Surgery of the Retina and [place] 01/05/2020   I have reviewed the above documentation for accuracy and completeness, and I agree with the above. Gardiner Sleeper, M.D., Ph.D. 01/05/20 4:58 PM    Abbreviations: M myopia (nearsighted); A astigmatism; H hyperopia (farsighted); P presbyopia; Mrx spectacle prescription;  CTL contact lenses; OD right eye; OS left eye; OU both eyes  XT exotropia; ET esotropia; PEK punctate epithelial keratitis; PEE punctate epithelial erosions; DES dry eye syndrome; MGD meibomian gland dysfunction; ATs artificial  tears; PFAT's preservative free artificial tears; [place] nuclear sclerotic cataract; PSC posterior subcapsular cataract; ERM epi-retinal membrane; PVD posterior vitreous detachment; RD retinal detachment; DM diabetes mellitus; DR diabetic retinopathy; NPDR non-proliferative diabetic retinopathy; PDR proliferative diabetic retinopathy; CSME clinically significant macular edema; DME diabetic macular edema; dbh dot blot hemorrhages; CWS cotton wool spot; POAG primary open angle glaucoma; C/D cup-to-disc ratio; HVF humphrey visual field; GVF goldmann visual field; OCT optical coherence tomography; IOP intraocular pressure; BRVO Branch retinal vein occlusion; CRVO central retinal vein occlusion; CRAO central retinal artery occlusion; BRAO branch retinal artery occlusion; RT retinal tear; SB scleral buckle; PPV pars plana vitrectomy; VH Vitreous hemorrhage; PRP panretinal laser photocoagulation; IVK intravitreal kenalog; VMT vitreomacular traction; MH Macular hole;  NVD neovascularization of the disc; NVE neovascularization elsewhere; AREDS age related eye disease study; ARMD age related macular degeneration; POAG primary open angle glaucoma; EBMD epithelial/anterior basement membrane dystrophy; ACIOL anterior chamber intraocular lens; IOL intraocular lens; PCIOL posterior chamber intraocular lens; Phaco/IOL phacoemulsification with intraocular lens placement; [place] photorefractive keratectomy; LASIK laser assisted in situ keratomileusis; HTN hypertension; DM diabetes mellitus; COPD chronic obstructive pulmonary disease

## 2020-01-05 ENCOUNTER — Ambulatory Visit (INDEPENDENT_AMBULATORY_CARE_PROVIDER_SITE_OTHER): Payer: Medicare Other | Admitting: Ophthalmology

## 2020-01-05 ENCOUNTER — Encounter (INDEPENDENT_AMBULATORY_CARE_PROVIDER_SITE_OTHER): Payer: Self-pay | Admitting: Ophthalmology

## 2020-01-05 DIAGNOSIS — H34831 Tributary (branch) retinal vein occlusion, right eye, with macular edema: Secondary | ICD-10-CM

## 2020-01-05 DIAGNOSIS — H35033 Hypertensive retinopathy, bilateral: Secondary | ICD-10-CM

## 2020-01-05 DIAGNOSIS — I10 Essential (primary) hypertension: Secondary | ICD-10-CM | POA: Diagnosis not present

## 2020-01-05 DIAGNOSIS — E119 Type 2 diabetes mellitus without complications: Secondary | ICD-10-CM

## 2020-01-05 DIAGNOSIS — H3581 Retinal edema: Secondary | ICD-10-CM | POA: Diagnosis not present

## 2020-01-05 DIAGNOSIS — H25813 Combined forms of age-related cataract, bilateral: Secondary | ICD-10-CM

## 2020-01-05 MED ORDER — BEVACIZUMAB CHEMO INJECTION 1.25MG/0.05ML SYRINGE FOR KALEIDOSCOPE
1.2500 mg | INTRAVITREAL | Status: AC | PRN
  Administered 2020-01-05: 1.25 mg via INTRAVITREAL

## 2020-01-06 ENCOUNTER — Ambulatory Visit (HOSPITAL_COMMUNITY): Payer: Medicare Other

## 2020-01-07 ENCOUNTER — Encounter (HOSPITAL_COMMUNITY): Payer: Self-pay

## 2020-01-07 ENCOUNTER — Ambulatory Visit (HOSPITAL_COMMUNITY): Payer: Medicare Other | Attending: Orthopedic Surgery

## 2020-01-07 ENCOUNTER — Other Ambulatory Visit: Payer: Self-pay

## 2020-01-07 DIAGNOSIS — M25611 Stiffness of right shoulder, not elsewhere classified: Secondary | ICD-10-CM | POA: Insufficient documentation

## 2020-01-07 DIAGNOSIS — M25511 Pain in right shoulder: Secondary | ICD-10-CM | POA: Diagnosis present

## 2020-01-07 DIAGNOSIS — R29898 Other symptoms and signs involving the musculoskeletal system: Secondary | ICD-10-CM | POA: Diagnosis present

## 2020-01-07 NOTE — Patient Instructions (Signed)
Perform each exercise __10-15______ reps. 2-3x days.   Protraction - Laying down  Start by holding a wand or cane at chest height.  Next, slowly push the wand outwards in front of your body so that your elbows become fully straightened. Then, return to the original position.     Shoulder FLEXION - laying - PALMS DOWN  Hold a wand/cane with both arms, palms down on both sides. Raise up the wand/cane allowing your unaffected arm to perform most of the effort. Your affected arm should be partially relaxed.      Internal/External ROTATION - Laying down  Hold a wand/cane with both hands keeping your elbows bent. Move your arms and wand/cane to one side.  Your affected arm should be partially relaxed while your unaffected arm performs most of the effort.       Shoulder ABDUCTION - Laying down  While holding a wand/cane palm face up on the injured side and palm face down on the uninjured side, slowly raise up your injured arm to the side.            Horizontal Abduction/Adduction      Straight arms holding cane at shoulder height, bring cane to right, center, left. Repeat starting to left.   Copyright  VHI. All rights reserved.

## 2020-01-07 NOTE — Therapy (Signed)
[place], [place], [address] Phone: [phone]   Fax:  [phone]  Occupational Therapy Evaluation  Patient Details  Name: Kurt [name] MRN: [id number] Date of Birth: 1951-02-19 Referring Provider (OT): Dr. Marcene Duos   Encounter Date: 01/07/2020  OT End of Session - 01/07/20 1713    Visit Number  1    Number of Visits  12    Date for OT Re-Evaluation  02/18/20    Authorization Type  [employer] medicare    Authorization Time Period  no visit limit. $35 copay. Complete 10th visit progress note.    Authorization - Visit Number  1    Authorization - Number of Visits  10    OT Start Time  1430    OT Stop Time  1510    OT Time Calculation (min)  40 min    Activity Tolerance  Patient tolerated treatment well    Behavior During Therapy  WFL for tasks assessed/performed       Past Medical History:  Diagnosis Date  . Arthritis   . Bronchitis   . Bronchitis   . Concussion    late 1990's  after a fall  . Diabetes mellitus without complication ([place])   . GERD (gastroesophageal reflux disease)   . History of kidney stones   . Hypertension   . Osteogenesis imperfecta   . PONV (postoperative nausea and vomiting)    pt has post polio syndrome  . Post-polio syndrome     Past Surgical History:  Procedure Laterality Date  . ANKLE FRACTURE SURGERY Bilateral   . arm surgery Right    nerve surgery  . COLONOSCOPY    . ELBOW FRACTURE SURGERY Left   . EYE MUSCLE SURGERY Left   . LEG SURGERY Left    femur fracture with rod  . REVERSE SHOULDER ARTHROPLASTY Right 11/10/2019   Procedure: RIGHT REVERSE SHOULDER ARTHROPLASTY;  Surgeon: Meredith Pel, MD;  Location: [facility];  Service: Orthopedics;  Laterality: Right;  . TONSILLECTOMY      There were no vitals filed for this visit.  Subjective Assessment - 01/07/20 1438    Subjective   S: I've been using that machine to work my arm and I can get it so high.    Pertinent History  Patient is a 69 y/o male S/P right reverse total shoulder which was completed on 11/10/19. patient received Home health PT services and has been using a CPM machine. Dr. Marlou Sa has referred patient to occupational therapy for evaluation and treatment.    Patient Stated Goals  To be able to increase use of his RUE.    Currently in Pain?  Yes    Pain Score  4     Pain Location  Shoulder    Pain Orientation  Right    Pain Descriptors / Indicators  Aching    Pain Type  Acute pain    Pain Radiating Towards  N/A    Pain Onset  Today    Pain Frequency  Constant    Aggravating Factors   cold weather    Pain Relieving Factors  pain medication if needed (prescription)    Effect of Pain on Daily Activities  moderate effect- patient is cautious.    Multiple Pain Sites  No        [facility] OT Assessment - 01/07/20 1442      Assessment   Medical Diagnosis  Right shoulder S/P reverse total repair  Referring Provider (OT)  Dr. Rise Paganini    Onset Date/Surgical Date  11/10/19    Hand Dominance  Left    Next MD Visit  02/04/20    Prior Therapy  Pt received Home health PT after surgery.      Precautions   Precautions  Shoulder    Type of Shoulder Precautions  P/ROM, A/ROM as tolerated. No lifting over 15# long term. Patient has a history of polo which effected his right side with decreased strength at baseline.       Restrictions   Weight Bearing Restrictions  Yes    RUE Weight Bearing  Non weight bearing      Balance Screen   Has the patient fallen in the past 6 months  No      Home  Environment   Family/patient expects to be discharged to:  Private residence    Living Arrangements  Alone      Prior Function   Level of Independence  Requires assistive device for independence    Vocation  Retired    Leisure  Patient has 6 cats. Some are his own and some are fostered.      ADL   ADL comments  Difficulty with clipping seat belt with right hand. instability when reaching  out, reaching above head, getting shirts on and off.       Mobility   Mobility Status  Independent      Written Expression   Dominant Hand  Left      Vision - History   Baseline Vision  Wears glasses all the time      Cognition   Overall Cognitive Status  Within Functional Limits for tasks assessed      Observation/Other Assessments   Focus on Therapeutic Outcomes (FOTO)   Complete at next session      ROM / Strength   AROM / PROM / Strength  AROM;PROM;Strength      Palpation   Palpation comment  moderate fascial restrictions in the right upper arm region.       AROM   Overall AROM Comments  Assessed seated. IR/er adducted    AROM Assessment Site  Shoulder    Right/Left Shoulder  Right    Right Shoulder Flexion  130 Degrees    Right Shoulder ABduction  94 Degrees    Right Shoulder Internal Rotation  75 Degrees    Right Shoulder External Rotation  10 Degrees      PROM   Overall PROM Comments  Assessed supine. IR/er adducted    PROM Assessment Site  Shoulder    Right/Left Shoulder  Right    Right Shoulder Flexion  110 Degrees    Right Shoulder ABduction  121 Degrees    Right Shoulder Internal Rotation  75 Degrees    Right Shoulder External Rotation  60 Degrees      Strength   Overall Strength Comments  Assessed seated. IR/er adducted    Strength Assessment Site  Shoulder    Right/Left Shoulder  Right    Right Shoulder Flexion  3/5    Right Shoulder ABduction  3-/5    Right Shoulder Internal Rotation  3-/5    Right Shoulder External Rotation  3-/5                      OT Education - 01/07/20 1711    Education Details  AA/ROM supine shoulder exercise    Person(s) Educated  Patient  Methods  Explanation;Demonstration;Handout;Verbal cues    Comprehension  Returned demonstration;Verbalized understanding       OT Short Term Goals - 01/07/20 1728      OT SHORT TERM GOAL #1   Title  Patient will be educated and independent with HEP in order to  increase functional use of RUE during daily tasks and faciliate progress in therapy.    Time  3    Period  Weeks    Status  New    Target Date  01/28/20      OT SHORT TERM GOAL #2   Title  Patient will increase RUE P/ROM to [facility] in order to increase ability to get shirts on and off with less difficulty.    Time  3    Period  Weeks    Status  New      OT SHORT TERM GOAL #3   Title  Patient will increase RUE strength to 3+/5 in order to be able to complete lightweight lifting tasks at home.    Time  3    Period  Weeks    Status  New      OT SHORT TERM GOAL #4   Title  Patient will decrease pain level to 3/10 during functional use activities.    Time  3    Period  Weeks    Status  New      OT SHORT TERM GOAL #5   Title  Patient will decrease fascial restrictions to a minimal amount in the RUE in order to increase functional mobility needed to complete reaching tasks.    Time  3    Period  Weeks    Status  New        OT Long Term Goals - 01/07/20 1732      OT LONG TERM GOAL #1   Title  Patient will report increased independence with his RUE while using it for 75% or more of daily tasks.    Time  6    Period  Weeks    Status  New    Target Date  02/18/20      OT LONG TERM GOAL #2   Title  Patient will increase RUE A/ROM to [facility] in order to complete reaching tasks at or above shoulder level.    Time  6    Period  Weeks    Status  New      OT LONG TERM GOAL #3   Title  Patient will increase RUE strength to 4/5 in order to return to lifting normal household items with less difficulty.    Time  6    Period  Weeks    Status  New      OT LONG TERM GOAL #4   Title  Patient will report a decrease in pain level of approximately 2/10 or less when utilizing his RUE for daily tasks.    Time  6    Period  Weeks    Status  New            Plan - 01/07/20 1715    Clinical Impression Statement  A: Patient is a 69 y/o male S/P right total reverse shoulder repair causing  increased pain, fascial restrictions, and decreased ROM and strength resulting in difficulty completing daily tasks while using his RUE as his non-dominant extremity. Patient reports that he may have to complete only a certain amount of treatment sessions due to finances although will communicate with use as plane  of care progresses.    OT Occupational Profile and History  Problem Focused Assessment - Including review of records relating to presenting problem    Occupational performance deficits (Please refer to evaluation for details):  ADL's;IADL's;Rest and Sleep    Body Structure / Function / Physical Skills  ADL;UE functional use;Fascial restriction;Pain;ROM;Strength    Rehab Potential  Excellent    Clinical Decision Making  Several treatment options, min-mod task modification necessary    Comorbidities Affecting Occupational Performance:  Presence of comorbidities impacting occupational performance    Comorbidities impacting occupational performance description:  history of polo with right side effected at baseline    Modification or Assistance to Complete Evaluation   No modification of tasks or assist necessary to complete eval    OT Frequency  2x / week    OT Duration  6 weeks    OT Treatment/Interventions  Self-care/ADL training;Ultrasound;DME and/or AE instruction;Patient/family education;Passive range of motion;Cryotherapy;Electrical Stimulation;Moist Heat;Neuromuscular education;Therapeutic activities;Manual Therapy;Therapeutic exercise    Plan  P: Patient will benefit from skilled OT services to increase functional performance during daily tasks using his RUE as his nondominant extremity. Treatment Plan: Complete FOTO next session. Myofascial release to posterior shoulder region, passive stretching, AA/ROM, A/ROM, general strengthening. Modalities PRN.    Consulted and Agree with Plan of Care  Patient       Patient will benefit from skilled therapeutic intervention in order to improve  the following deficits and impairments:   Body Structure / Function / Physical Skills: ADL, UE functional use, Fascial restriction, Pain, ROM, Strength       Visit Diagnosis: Other symptoms and signs involving the musculoskeletal system - Plan: Ot plan of care cert/re-cert  Acute pain of right shoulder - Plan: Ot plan of care cert/re-cert  Stiffness of right shoulder, not elsewhere classified - Plan: Ot plan of care cert/re-cert    Problem List Patient Active Problem List   Diagnosis Date Noted  . 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 Cellulitis 02/24/2019  . 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 Osteogenesis imperfecta 12/25/2018  . Diabetes mellitus without complication [facility]) 12/25/2018   Limmie Patricia, OTR/L,CBIS  [phone]  01/07/2020, 5:47 PM  [facility] [address] [place], [place], [address] Phone: [phone]   Fax:  [phone]  Name: Kurt [name] MRN: [id number] Date of Birth: Oct 12, 1951

## 2020-01-11 ENCOUNTER — Encounter (HOSPITAL_COMMUNITY): Payer: Self-pay

## 2020-01-11 ENCOUNTER — Other Ambulatory Visit: Payer: Self-pay

## 2020-01-11 ENCOUNTER — Ambulatory Visit (HOSPITAL_COMMUNITY): Payer: Medicare Other

## 2020-01-11 DIAGNOSIS — M25611 Stiffness of right shoulder, not elsewhere classified: Secondary | ICD-10-CM

## 2020-01-11 DIAGNOSIS — R29898 Other symptoms and signs involving the musculoskeletal system: Secondary | ICD-10-CM | POA: Diagnosis not present

## 2020-01-11 DIAGNOSIS — M25511 Pain in right shoulder: Secondary | ICD-10-CM

## 2020-01-11 NOTE — Therapy (Signed)
[place] [place] [facility] [address] [place], [place], [address] Phone: [phone]   Fax:  [phone]  Occupational Therapy Treatment  Patient Details  Name: Kurt [name] MRN: [id number] Date of Birth: 1951/06/18 Referring Provider (OT): Dr. Rise Paganini   Encounter Date: 01/11/2020  OT End of Session - 01/11/20 1506    Visit Number  2    Number of Visits  12    Date for OT Re-Evaluation  02/18/20    Authorization Type  [employer] medicare    Authorization Time Period  no visit limit. $35 copay. Complete 10th visit progress note.    Authorization - Visit Number  2    Authorization - Number of Visits  10    OT Start Time  1430    OT Stop Time  1511    OT Time Calculation (min)  41 min    Activity Tolerance  Patient tolerated treatment well    Behavior During Therapy  WFL for tasks assessed/performed       Past Medical History:  Diagnosis Date  . Arthritis   . Bronchitis   . Bronchitis   . Concussion    late 1990's  after a fall  . Diabetes mellitus without complication ([facility])   . GERD (gastroesophageal reflux disease)   . History of kidney stones   . Hypertension   . Osteogenesis imperfecta   . PONV (postoperative nausea and vomiting)    pt has post polio syndrome  . Post-polio syndrome     Past Surgical History:  Procedure Laterality Date  . ANKLE FRACTURE SURGERY Bilateral   . arm surgery Right    nerve surgery  . COLONOSCOPY    . ELBOW FRACTURE SURGERY Left   . EYE MUSCLE SURGERY Left   . LEG SURGERY Left    femur fracture with rod  . REVERSE SHOULDER ARTHROPLASTY Right 11/10/2019   Procedure: RIGHT REVERSE SHOULDER ARTHROPLASTY;  Surgeon: Cammy Copa, MD;  Location: [facility] OR;  Service: Orthopedics;  Laterality: Right;  . TONSILLECTOMY      There were no vitals filed for this visit.  Subjective Assessment - 01/11/20 1501    Subjective   S: I didn't take anything for pain. I'll probably regret that.    Currently in  Pain?  No/denies         [facility] OT Assessment - 01/11/20 1502      Assessment   Medical Diagnosis  Right shoulder S/P reverse total repair      Precautions   Precautions  Shoulder    Type of Shoulder Precautions  P/ROM, A/ROM as tolerated. No lifting over 15# long term. Patient has a history of polo which effected his right side with decreased strength at baseline.       Observation/Other Assessments   Focus on Therapeutic Outcomes (FOTO)   47/100               OT Treatments/Exercises (OP) - 01/11/20 1503      Exercises   Exercises  Shoulder      Shoulder Exercises: Supine   Protraction  PROM;5 reps;AAROM;10 reps    Horizontal ABduction  PROM;5 reps;AAROM;10 reps    External Rotation  PROM;5 reps;AAROM;10 reps    Internal Rotation  PROM;5 reps;AAROM;10 reps    Flexion  PROM;5 reps;AAROM;10 reps    ABduction  PROM;5 reps      Manual Therapy   Manual Therapy  Myofascial release    Manual therapy comments  manual  therapy completed prior to exercises.    Myofascial Release  Myofascial release and manual stretching completed to right upper arm, trapezius, and scapularis region to decrease fascial restrictions and increase joint mobility in a pain free zone.              OT Education - 01/11/20 1505    Education Details  Completed FOTO and reviewed score and reason for use. Reviewed therapy goals.    Person(s) Educated  Patient    Methods  Explanation    Comprehension  Verbalized understanding       OT Short Term Goals - 01/11/20 1506      OT SHORT TERM GOAL #1   Title  Patient will be educated and independent with HEP in order to increase functional use of RUE during daily tasks and faciliate progress in therapy.    Time  3    Period  Weeks    Status  On-going    Target Date  01/28/20      OT SHORT TERM GOAL #2   Title  Patient will increase RUE P/ROM to [facility] in order to increase ability to get shirts on and off with less difficulty.    Time  3     Period  Weeks    Status  On-going      OT SHORT TERM GOAL #3   Title  Patient will increase RUE strength to 3+/5 in order to be able to complete lightweight lifting tasks at home.    Time  3    Period  Weeks    Status  On-going      OT SHORT TERM GOAL #4   Title  Patient will decrease pain level to 3/10 during functional use activities.    Time  3    Period  Weeks    Status  On-going      OT SHORT TERM GOAL #5   Title  Patient will decrease fascial restrictions to a minimal amount in the RUE in order to increase functional mobility needed to complete reaching tasks.    Time  3    Period  Weeks    Status  On-going        OT Long Term Goals - 01/11/20 1506      OT LONG TERM GOAL #1   Title  Patient will report increased independence with his RUE while using it for 75% or more of daily tasks.    Time  6    Period  Weeks    Status  On-going      OT LONG TERM GOAL #2   Title  Patient will increase RUE A/ROM to [facility] in order to complete reaching tasks at or above shoulder level.    Time  6    Period  Weeks    Status  On-going      OT LONG TERM GOAL #3   Title  Patient will increase RUE strength to 4/5 in order to return to lifting normal household items with less difficulty.    Time  6    Period  Weeks    Status  On-going      OT LONG TERM GOAL #4   Title  Patient will report a decrease in pain level of approximately 2/10 or less when utilizing his RUE for daily tasks.    Time  6    Period  Weeks    Status  On-going  Plan - 01/11/20 1506    Clinical Impression Statement  A: minimal fascial restrictions noted in the right anterior portion of shoulder with manual techniques completed to address. VC for form and technique were provided. Increased time needed to complete exercises due to difficulty level.    Body Structure / Function / Physical Skills  ADL;UE functional use;Fascial restriction;Pain;ROM;Strength    Plan  P: Complete abduction in supine with  dowel. Add seated AA/ROM if able to complete/tolerate with good form and technique.    Consulted and Agree with Plan of Care  Patient       Patient will benefit from skilled therapeutic intervention in order to improve the following deficits and impairments:   Body Structure / Function / Physical Skills: ADL, UE functional use, Fascial restriction, Pain, ROM, Strength       Visit Diagnosis: Stiffness of right shoulder, not elsewhere classified  Acute pain of right shoulder  Other symptoms and signs involving the musculoskeletal system    Problem List Patient Active Problem List   Diagnosis Date Noted  . 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 Cellulitis 02/24/2019  . 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 Osteogenesis imperfecta 12/25/2018  . Diabetes mellitus without complication [facility]) 14/97/0263   Ailene Ravel, OTR/L,CBIS  [phone]  01/11/2020, 3:38 PM  [facility] [address] [place], [place], [address] Phone: [phone]   Fax:  [phone]  Name: LINFORD QUINTELA MRN: [id number] Date of Birth: [birth year]

## 2020-01-13 ENCOUNTER — Ambulatory Visit (HOSPITAL_COMMUNITY): Payer: Medicare Other

## 2020-01-13 ENCOUNTER — Encounter (HOSPITAL_COMMUNITY): Payer: Self-pay

## 2020-01-13 ENCOUNTER — Other Ambulatory Visit: Payer: Self-pay

## 2020-01-13 DIAGNOSIS — M25611 Stiffness of right shoulder, not elsewhere classified: Secondary | ICD-10-CM

## 2020-01-13 DIAGNOSIS — R29898 Other symptoms and signs involving the musculoskeletal system: Secondary | ICD-10-CM | POA: Diagnosis not present

## 2020-01-13 DIAGNOSIS — M25511 Pain in right shoulder: Secondary | ICD-10-CM

## 2020-01-13 NOTE — Therapy (Signed)
[place], [place], [address] Phone: [phone]   Fax:  [phone]  Occupational Therapy Treatment  Patient Details  Name: Kurt [name] MRN: [id number] Date of Birth: 01-24-1951 Referring Provider (OT): Dr. Marcene Duos   Encounter Date: 01/13/2020  OT End of Session - 01/13/20 1511    Visit Number  3    Number of Visits  12    Date for OT Re-Evaluation  02/18/20    Authorization Type  [employer] medicare    Authorization Time Period  no visit limit. $35 copay. Complete 10th visit progress note.    Authorization - Visit Number  3    Authorization - Number of Visits  10    OT Start Time  0109    OT Stop Time  1515    OT Time Calculation (min)  42 min    Activity Tolerance  Patient tolerated treatment well    Behavior During Therapy  WFL for tasks assessed/performed       Past Medical History:  Diagnosis Date  . Arthritis   . Bronchitis   . Bronchitis   . Concussion    late 1990's  after a fall  . Diabetes mellitus without complication ([place])   . GERD (gastroesophageal reflux disease)   . History of kidney stones   . Hypertension   . Osteogenesis imperfecta   . PONV (postoperative nausea and vomiting)    pt has post polio syndrome  . Post-polio syndrome     Past Surgical History:  Procedure Laterality Date  . ANKLE FRACTURE SURGERY Bilateral   . arm surgery Right    nerve surgery  . COLONOSCOPY    . ELBOW FRACTURE SURGERY Left   . EYE MUSCLE SURGERY Left   . LEG SURGERY Left    femur fracture with rod  . REVERSE SHOULDER ARTHROPLASTY Right 11/10/2019   Procedure: RIGHT REVERSE SHOULDER ARTHROPLASTY;  Surgeon: Meredith Pel, MD;  Location: [facility];  Service: Orthopedics;  Laterality: Right;  . TONSILLECTOMY      There were no vitals filed for this visit.  Subjective Assessment - 01/13/20 1452    Subjective   S: I remembered to take something for pain today.    Currently in Pain?   No/denies         [facility] OT Assessment - 01/13/20 1452      Assessment   Medical Diagnosis  Right shoulder S/P reverse total repair      Precautions   Precautions  Shoulder    Type of Shoulder Precautions  P/ROM, A/ROM as tolerated. No lifting over 15# long term. Patient has a history of polo which effected his right side with decreased strength at baseline.                OT Treatments/Exercises (OP) - 01/13/20 1453      Exercises   Exercises  Shoulder      Shoulder Exercises: Supine   Protraction  PROM;5 reps;AAROM;10 reps    Horizontal ABduction  PROM;5 reps;AAROM;10 reps    External Rotation  PROM;5 reps;AAROM;10 reps    Internal Rotation  PROM;5 reps;AAROM;10 reps    Flexion  PROM;5 reps;AAROM;10 reps    ABduction  PROM;5 reps      Shoulder Exercises: Seated   Protraction  AAROM;10 reps    Flexion  AAROM;10 reps               OT Short Term Goals - 01/11/20  1506      OT SHORT TERM GOAL #1   Title  Patient will be educated and independent with HEP in order to increase functional use of RUE during daily tasks and faciliate progress in therapy.    Time  3    Period  Weeks    Status  On-going    Target Date  01/28/20      OT SHORT TERM GOAL #2   Title  Patient will increase RUE P/ROM to [facility] in order to increase ability to get shirts on and off with less difficulty.    Time  3    Period  Weeks    Status  On-going      OT SHORT TERM GOAL #3   Title  Patient will increase RUE strength to 3+/5 in order to be able to complete lightweight lifting tasks at home.    Time  3    Period  Weeks    Status  On-going      OT SHORT TERM GOAL #4   Title  Patient will decrease pain level to 3/10 during functional use activities.    Time  3    Period  Weeks    Status  On-going      OT SHORT TERM GOAL #5   Title  Patient will decrease fascial restrictions to a minimal amount in the RUE in order to increase functional mobility needed to complete reaching  tasks.    Time  3    Period  Weeks    Status  On-going        OT Long Term Goals - 01/11/20 1506      OT LONG TERM GOAL #1   Title  Patient will report increased independence with his RUE while using it for 75% or more of daily tasks.    Time  6    Period  Weeks    Status  On-going      OT LONG TERM GOAL #2   Title  Patient will increase RUE A/ROM to [facility] in order to complete reaching tasks at or above shoulder level.    Time  6    Period  Weeks    Status  On-going      OT LONG TERM GOAL #3   Title  Patient will increase RUE strength to 4/5 in order to return to lifting normal household items with less difficulty.    Time  6    Period  Weeks    Status  On-going      OT LONG TERM GOAL #4   Title  Patient will report a decrease in pain level of approximately 2/10 or less when utilizing his RUE for daily tasks.    Time  6    Period  Weeks    Status  On-going            Plan - 01/13/20 1512    Clinical Impression Statement  A: patient required increased time to complete all exercises. Myofascial release was omitted this date to attempt to complete more exercises. VC for form and technique were provided. Pt has difficulty with relaxing during passive stretching and frequently displays muscle guarding.    Body Structure / Function / Physical Skills  ADL;UE functional use;Fascial restriction;Pain;ROM;Strength    Plan  P: Omit supine exercises and myofascial release due to time. Complete seated AA/ROM. Attempt wall wash and/or PVC pipe slide.    Consulted and Agree with Plan of Care  Patient  Patient will benefit from skilled therapeutic intervention in order to improve the following deficits and impairments:   Body Structure / Function / Physical Skills: ADL, UE functional use, Fascial restriction, Pain, ROM, Strength       Visit Diagnosis: Stiffness of right shoulder, not elsewhere classified  Acute pain of right shoulder  Other symptoms and signs involving  the musculoskeletal system    Problem List Patient Active Problem List   Diagnosis Date Noted  . 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 Cellulitis 02/24/2019  . 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 Osteogenesis imperfecta 12/25/2018  . Diabetes mellitus without complication [facility]) 12/25/2018   Limmie Patricia, OTR/L,CBIS  [phone]  01/13/2020, 4:31 PM  [facility] [address] [place], [place], [address] Phone: [phone]   Fax:  [phone]  Name: Kurt [name] MRN: [id number] Date of Birth: [birth year]

## 2020-01-19 ENCOUNTER — Encounter (HOSPITAL_COMMUNITY): Payer: Self-pay | Admitting: Occupational Therapy

## 2020-01-19 ENCOUNTER — Other Ambulatory Visit: Payer: Self-pay

## 2020-01-19 ENCOUNTER — Ambulatory Visit (HOSPITAL_COMMUNITY): Payer: Medicare Other | Attending: Orthopedic Surgery | Admitting: Occupational Therapy

## 2020-01-19 DIAGNOSIS — R29898 Other symptoms and signs involving the musculoskeletal system: Secondary | ICD-10-CM | POA: Diagnosis present

## 2020-01-19 DIAGNOSIS — M25611 Stiffness of right shoulder, not elsewhere classified: Secondary | ICD-10-CM

## 2020-01-19 DIAGNOSIS — M25511 Pain in right shoulder: Secondary | ICD-10-CM

## 2020-01-19 NOTE — Therapy (Signed)
[facility] [address] [place], [place], [address] Phone: [phone]   Fax:  [phone]  Occupational Therapy Treatment  Patient Details  Name: Kurt [name] MRN: [id number] Date of Birth: 1951/08/17 Referring Provider (OT): Dr. Rise Paganini   Encounter Date: 01/19/2020  OT End of Session - 01/19/20 1509    Visit Number  4    Number of Visits  12    Date for OT Re-Evaluation  02/18/20    Authorization Type  [employer] medicare    Authorization Time Period  no visit limit. $35 copay. Complete 10th visit progress note.    Authorization - Visit Number  4    Authorization - Number of Visits  10    OT Start Time  1430   time beginning at 1437 due to pt in bathroom   OT Stop Time  1515    OT Time Calculation (min)  45 min    Activity Tolerance  Patient tolerated treatment well    Behavior During Therapy  [facility] for tasks assessed/performed       Past Medical History:  Diagnosis Date  . Arthritis   . Bronchitis   . Bronchitis   . Concussion    late 1990's  after a fall  . Diabetes mellitus without complication (HCC)   . GERD (gastroesophageal reflux disease)   . History of kidney stones   . Hypertension   . Osteogenesis imperfecta   . PONV (postoperative nausea and vomiting)    pt has post polio syndrome  . Post-polio syndrome     Past Surgical History:  Procedure Laterality Date  . ANKLE FRACTURE SURGERY Bilateral   . arm surgery Right    nerve surgery  . COLONOSCOPY    . ELBOW FRACTURE SURGERY Left   . EYE MUSCLE SURGERY Left   . LEG SURGERY Left    femur fracture with rod  . REVERSE SHOULDER ARTHROPLASTY Right 11/10/2019   Procedure: RIGHT REVERSE SHOULDER ARTHROPLASTY;  Surgeon: Cammy Copa, MD;  [facility]: [facility] OR;  Service: Orthopedics;  Laterality: Right;  . TONSILLECTOMY      There were no vitals filed for this visit.  Subjective Assessment - 01/19/20 1431    Subjective   S: I forgot to take my pain  medicine and the cold weather doesn't help either.    Currently in Pain?  Yes    Pain Score  4     Pain Location  Shoulder    Pain Orientation  Right    Pain Descriptors / Indicators  Aching;Sore    Pain Type  Acute pain    Pain Radiating Towards  N/A    Pain Onset  Yesterday    Pain Frequency  Constant    Aggravating Factors   cold weather    Pain Relieving Factors  pain medication if needed    Effect of Pain on Daily Activities  mod effect-pt is cautious    Multiple Pain Sites  No         [facility] OT Assessment - 01/19/20 1431      Assessment   Medical Diagnosis  Right shoulder S/P reverse total repair      Precautions   Precautions  Shoulder    Type of Shoulder Precautions  P/ROM, A/ROM as tolerated. No lifting over 15# long term. Patient has a history of polo which effected his right side with decreased strength at baseline.  OT Treatments/Exercises (OP) - 01/19/20 1440      Exercises   Exercises  Shoulder      Shoulder Exercises: Seated   Protraction  AAROM;10 reps    Horizontal ABduction  AAROM;10 reps    External Rotation  AAROM;10 reps    Internal Rotation  AAROM;10 reps    Flexion  AAROM;10 reps    Abduction  AAROM;10 reps      Shoulder Exercises: Therapy Ball   Flexion  10 reps               OT Short Term Goals - 01/11/20 1506      OT SHORT TERM GOAL #1   Title  Patient will be educated and independent with HEP in order to increase functional use of RUE during daily tasks and faciliate progress in therapy.    Time  3    Period  Weeks    Status  On-going    Target Date  01/28/20      OT SHORT TERM GOAL #2   Title  Patient will increase RUE P/ROM to [facility] in order to increase ability to get shirts on and off with less difficulty.    Time  3    Period  Weeks    Status  On-going      OT SHORT TERM GOAL #3   Title  Patient will increase RUE strength to 3+/5 in order to be able to complete lightweight lifting tasks at home.     Time  3    Period  Weeks    Status  On-going      OT SHORT TERM GOAL #4   Title  Patient will decrease pain level to 3/10 during functional use activities.    Time  3    Period  Weeks    Status  On-going      OT SHORT TERM GOAL #5   Title  Patient will decrease fascial restrictions to a minimal amount in the RUE in order to increase functional mobility needed to complete reaching tasks.    Time  3    Period  Weeks    Status  On-going        OT Long Term Goals - 01/11/20 1506      OT LONG TERM GOAL #1   Title  Patient will report increased independence with his RUE while using it for 75% or more of daily tasks.    Time  6    Period  Weeks    Status  On-going      OT LONG TERM GOAL #2   Title  Patient will increase RUE A/ROM to [facility] in order to complete reaching tasks at or above shoulder level.    Time  6    Period  Weeks    Status  On-going      OT LONG TERM GOAL #3   Title  Patient will increase RUE strength to 4/5 in order to return to lifting normal household items with less difficulty.    Time  6    Period  Weeks    Status  On-going      OT LONG TERM GOAL #4   Title  Patient will report a decrease in pain level of approximately 2/10 or less when utilizing his RUE for daily tasks.    Time  6    Period  Weeks    Status  On-going            Plan -  01/19/20 1458    Clinical Impression Statement  A: Pt arrived on time for appt but exercise did not begin until 1437 due to pt needing to use restroom. Pt completing AA/ROM in sitting today, increased time required for fatigue and rest breaks provided as needed. Pt with the most difficulty with horizontal adduction. Completed therapy ball stretches at end of session. Verbal cuing for form and technique.    Body Structure / Function / Physical Skills  ADL;UE functional use;Fascial restriction;Pain;ROM;Strength    Plan  P: Continue with seated AA/ROM, attempt pulleys       Patient will benefit from skilled  therapeutic intervention in order to improve the following deficits and impairments:   Body Structure / Function / Physical Skills: ADL, UE functional use, Fascial restriction, Pain, ROM, Strength       Visit Diagnosis: Stiffness of right shoulder, not elsewhere classified  Acute pain of right shoulder  Other symptoms and signs involving the musculoskeletal system    Problem List Patient Active Problem List   Diagnosis Date Noted  . 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 Cellulitis 02/24/2019  . 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 Osteogenesis imperfecta 12/25/2018  . Diabetes mellitus without complication [facility]) 12/25/2018   Ezra Sites, OTR/L  [phone] 01/19/2020, 3:37 PM  [facility] [address] [place], [place], [address] Phone: [phone]   Fax:  [phone]  Name: Kurt [name] MRN: [id number] Date of Birth: Mar 11, 1951

## 2020-01-21 ENCOUNTER — Ambulatory Visit (HOSPITAL_COMMUNITY): Payer: Medicare Other | Admitting: Occupational Therapy

## 2020-01-21 ENCOUNTER — Other Ambulatory Visit: Payer: Self-pay

## 2020-01-21 ENCOUNTER — Encounter (HOSPITAL_COMMUNITY): Payer: Self-pay | Admitting: Occupational Therapy

## 2020-01-21 DIAGNOSIS — M25611 Stiffness of right shoulder, not elsewhere classified: Secondary | ICD-10-CM

## 2020-01-21 DIAGNOSIS — M25511 Pain in right shoulder: Secondary | ICD-10-CM

## 2020-01-21 DIAGNOSIS — R29898 Other symptoms and signs involving the musculoskeletal system: Secondary | ICD-10-CM

## 2020-01-21 NOTE — Therapy (Signed)
[place], [place], [address] Phone: [phone]   Fax:  [phone]  Occupational Therapy Treatment  Patient Details  Name: Kurt [name] MRN: [id number] Date of Birth: [birth year] Referring Provider (OT): Dr. Marcene Duos   Encounter Date: 01/21/2020  OT End of Session - 01/21/20 1422    Visit Number  5    Number of Visits  12    Date for OT Re-Evaluation  02/18/20    Authorization Type  [employer] medicare    Authorization Time Period  no visit limit. $35 copay. Complete 10th visit progress note.    Authorization - Visit Number  5    Authorization - Number of Visits  10    OT Start Time  3220    OT Stop Time  1428    OT Time Calculation (min)  40 min    Activity Tolerance  Patient tolerated treatment well    Behavior During Therapy  WFL for tasks assessed/performed       Past Medical History:  Diagnosis Date  . Arthritis   . Bronchitis   . Bronchitis   . Concussion    late 1990's  after a fall  . Diabetes mellitus without complication ([place])   . GERD (gastroesophageal reflux disease)   . History of kidney stones   . Hypertension   . Osteogenesis imperfecta   . PONV (postoperative nausea and vomiting)    pt has post polio syndrome  . Post-polio syndrome     Past Surgical History:  Procedure Laterality Date  . ANKLE FRACTURE SURGERY Bilateral   . arm surgery Right    nerve surgery  . COLONOSCOPY    . ELBOW FRACTURE SURGERY Left   . EYE MUSCLE SURGERY Left   . LEG SURGERY Left    femur fracture with rod  . REVERSE SHOULDER ARTHROPLASTY Right 11/10/2019   Procedure: RIGHT REVERSE SHOULDER ARTHROPLASTY;  Surgeon: Meredith Pel, MD;  Location: [place] [facility];  Service: Orthopedics;  Laterality: Right;  . TONSILLECTOMY      There were no vitals filed for this visit.  Subjective Assessment - 01/21/20 1348    Subjective   S: I took my pain medication before I came.    Currently in Pain?  Yes    Pain  Score  3     Pain Location  Shoulder    Pain Orientation  Right    Pain Descriptors / Indicators  Aching;Sore    Pain Type  Acute pain    Pain Radiating Towards  N/A    Pain Onset  Yesterday    Pain Frequency  Constant    Aggravating Factors   cold weather    Pain Relieving Factors  pain medication as needed    Effect of Pain on Daily Activities  mod effect-pt is cautious         [facility] OT Assessment - 01/21/20 1348      Assessment   Medical Diagnosis  Right shoulder S/P reverse total repair      Precautions   Precautions  Shoulder    Type of Shoulder Precautions  P/ROM, A/ROM as tolerated. No lifting over 15# long term. Patient has a history of polo which effected his right side with decreased strength at baseline.                OT Treatments/Exercises (OP) - 01/21/20 1353      Exercises   Exercises  Shoulder  Shoulder Exercises: Seated   Protraction  AAROM;10 reps    Horizontal ABduction  AAROM;10 reps    External Rotation  AAROM;10 reps    Internal Rotation  AAROM;10 reps    Flexion  AAROM;10 reps    Abduction  AAROM;10 reps      Shoulder Exercises: Therapy Ball   Flexion  15 reps    ABduction  15 reps               OT Short Term Goals - 01/11/20 1506      OT SHORT TERM GOAL #1   Title  Patient will be educated and independent with HEP in order to increase functional use of RUE during daily tasks and faciliate progress in therapy.    Time  3    Period  Weeks    Status  On-going    Target Date  01/28/20      OT SHORT TERM GOAL #2   Title  Patient will increase RUE P/ROM to [facility] in order to increase ability to get shirts on and off with less difficulty.    Time  3    Period  Weeks    Status  On-going      OT SHORT TERM GOAL #3   Title  Patient will increase RUE strength to 3+/5 in order to be able to complete lightweight lifting tasks at home.    Time  3    Period  Weeks    Status  On-going      OT SHORT TERM GOAL #4   Title   Patient will decrease pain level to 3/10 during functional use activities.    Time  3    Period  Weeks    Status  On-going      OT SHORT TERM GOAL #5   Title  Patient will decrease fascial restrictions to a minimal amount in the RUE in order to increase functional mobility needed to complete reaching tasks.    Time  3    Period  Weeks    Status  On-going        OT Long Term Goals - 01/11/20 1506      OT LONG TERM GOAL #1   Title  Patient will report increased independence with his RUE while using it for 75% or more of daily tasks.    Time  6    Period  Weeks    Status  On-going      OT LONG TERM GOAL #2   Title  Patient will increase RUE A/ROM to [facility] in order to complete reaching tasks at or above shoulder level.    Time  6    Period  Weeks    Status  On-going      OT LONG TERM GOAL #3   Title  Patient will increase RUE strength to 4/5 in order to return to lifting normal household items with less difficulty.    Time  6    Period  Weeks    Status  On-going      OT LONG TERM GOAL #4   Title  Patient will report a decrease in pain level of approximately 2/10 or less when utilizing his RUE for daily tasks.    Time  6    Period  Weeks    Status  On-going            Plan - 01/21/20 1420    Clinical Impression Statement  A: Pt reporting less difficulty  with exercise completion today. Continued with AA/ROM and therapy ball exercises, added therapy ball abduction stretch. Increased time for exercise completion. Verbal cuing for form and technique.    Body Structure / Function / Physical Skills  ADL;UE functional use;Fascial restriction;Pain;ROM;Strength    Plan  P: Attempt pulleys and scapular theraband exercises       Patient will benefit from skilled therapeutic intervention in order to improve the following deficits and impairments:   Body Structure / Function / Physical Skills: ADL, UE functional use, Fascial restriction, Pain, ROM, Strength       Visit  Diagnosis: Stiffness of right shoulder, not elsewhere classified  Acute pain of right shoulder  Other symptoms and signs involving the musculoskeletal system    Problem List Patient Active Problem List   Diagnosis Date Noted  . 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 Cellulitis 02/24/2019  . 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 Osteogenesis imperfecta 12/25/2018  . Diabetes mellitus without complication [facility]) 12/25/2018   Ezra Sites, OTR/L  [phone] 01/21/2020, 2:28 PM  [facility] [address] [place], [place], [address] Phone: [phone]   Fax:  [phone]  Name: Kurt [name] MRN: [id number] Date of Birth: [birth year]

## 2020-01-26 ENCOUNTER — Other Ambulatory Visit: Payer: Self-pay

## 2020-01-26 ENCOUNTER — Ambulatory Visit (HOSPITAL_COMMUNITY): Payer: Medicare Other

## 2020-01-26 ENCOUNTER — Encounter (HOSPITAL_COMMUNITY): Payer: Self-pay

## 2020-01-26 DIAGNOSIS — M25611 Stiffness of right shoulder, not elsewhere classified: Secondary | ICD-10-CM

## 2020-01-26 DIAGNOSIS — R29898 Other symptoms and signs involving the musculoskeletal system: Secondary | ICD-10-CM

## 2020-01-26 DIAGNOSIS — M25511 Pain in right shoulder: Secondary | ICD-10-CM

## 2020-01-26 NOTE — Therapy (Signed)
[place], [place], [address] Phone: [phone]   Fax:  [phone]  Occupational Therapy Treatment  Patient Details  Name: Kurt [name] MRN: [id number] Date of Birth: 1951/06/19 Referring Provider (OT): Dr. Marcene Duos   Encounter Date: 01/26/2020  OT End of Session - 01/26/20 1410    Visit Number  6    Number of Visits  12    Date for OT Re-Evaluation  02/18/20    Authorization Type  [employer] medicare    Authorization Time Period  no visit limit. $35 copay. Complete 10th visit progress note.    Authorization - Visit Number  6    Authorization - Number of Visits  10    OT Start Time  8546   Pt used bathroom when pulled back into clinic   OT Stop Time  1426    OT Time Calculation (min)  32 min    Activity Tolerance  Patient tolerated treatment well    Behavior During Therapy  WFL for tasks assessed/performed       Past Medical History:  Diagnosis Date  . Arthritis   . Bronchitis   . Bronchitis   . Concussion    late 1990's  after a fall  . Diabetes mellitus without complication ([place])   . GERD (gastroesophageal reflux disease)   . History of kidney stones   . Hypertension   . Osteogenesis imperfecta   . PONV (postoperative nausea and vomiting)    pt has post polio syndrome  . Post-polio syndrome     Past Surgical History:  Procedure Laterality Date  . ANKLE FRACTURE SURGERY Bilateral   . arm surgery Right    nerve surgery  . COLONOSCOPY    . ELBOW FRACTURE SURGERY Left   . EYE MUSCLE SURGERY Left   . LEG SURGERY Left    femur fracture with rod  . REVERSE SHOULDER ARTHROPLASTY Right 11/10/2019   Procedure: RIGHT REVERSE SHOULDER ARTHROPLASTY;  Surgeon: Meredith Pel, MD;  Location: [facility];  Service: Orthopedics;  Laterality: Right;  . TONSILLECTOMY      There were no vitals filed for this visit.  Subjective Assessment - 01/26/20 1354    Subjective   S: I can almost touch my armpit on  the other side.    Currently in Pain?  Yes    Pain Score  3     Pain Location  Shoulder    Pain Descriptors / Indicators  Aching;Sore    Pain Type  Acute pain         [facility] OT Assessment - 01/26/20 1358      Assessment   Medical Diagnosis  Right shoulder S/P reverse total repair      Precautions   Precautions  Shoulder    Type of Shoulder Precautions  P/ROM, A/ROM as tolerated. No lifting over 15# long term. Patient has a history of polo which effected his right side with decreased strength at baseline.                OT Treatments/Exercises (OP) - 01/26/20 1358      Exercises   Exercises  Shoulder      Shoulder Exercises: Seated   Extension  Theraband;10 reps    Theraband Level (Shoulder Extension)  Level 2 (Red)    Retraction  Theraband;10 reps    Theraband Level (Shoulder Retraction)  Level 2 (Red)    Row  Theraband;10 reps    Theraband Level (  Shoulder Row)  Level 2 (Red)    Other Seated Exercises  Discussed ways to use anchored band in door to mimic motion of pushing seatbelt into clip.       Shoulder Exercises: Pulleys   Flexion  1 minute   sitting   ABduction  1 minute   sitting            OT Education - 01/26/20 1409    Education Details  red scapular strengthening exercises.    Person(s) Educated  Patient    Methods  Explanation;Demonstration;Verbal cues;Handout    Comprehension  Returned demonstration;Verbalized understanding       OT Short Term Goals - 01/11/20 1506      OT SHORT TERM GOAL #1   Title  Patient will be educated and independent with HEP in order to increase functional use of RUE during daily tasks and faciliate progress in therapy.    Time  3    Period  Weeks    Status  On-going    Target Date  01/28/20      OT SHORT TERM GOAL #2   Title  Patient will increase RUE P/ROM to [facility] in order to increase ability to get shirts on and off with less difficulty.    Time  3    Period  Weeks    Status  On-going      OT SHORT  TERM GOAL #3   Title  Patient will increase RUE strength to 3+/5 in order to be able to complete lightweight lifting tasks at home.    Time  3    Period  Weeks    Status  On-going      OT SHORT TERM GOAL #4   Title  Patient will decrease pain level to 3/10 during functional use activities.    Time  3    Period  Weeks    Status  On-going      OT SHORT TERM GOAL #5   Title  Patient will decrease fascial restrictions to a minimal amount in the RUE in order to increase functional mobility needed to complete reaching tasks.    Time  3    Period  Weeks    Status  On-going        OT Long Term Goals - 01/11/20 1506      OT LONG TERM GOAL #1   Title  Patient will report increased independence with his RUE while using it for 75% or more of daily tasks.    Time  6    Period  Weeks    Status  On-going      OT LONG TERM GOAL #2   Title  Patient will increase RUE A/ROM to [facility] in order to complete reaching tasks at or above shoulder level.    Time  6    Period  Weeks    Status  On-going      OT LONG TERM GOAL #3   Title  Patient will increase RUE strength to 4/5 in order to return to lifting normal household items with less difficulty.    Time  6    Period  Weeks    Status  On-going      OT LONG TERM GOAL #4   Title  Patient will report a decrease in pain level of approximately 2/10 or less when utilizing his RUE for daily tasks.    Time  6    Period  Weeks    Status  On-going  Plan - 01/26/20 1618    Clinical Impression Statement  A: Added pulleys and scapular strengthening this session to work on mobility and scapular strength. Patient required VC for form and technique. Reported that he liked these new exercises over the cane ones.    Body Structure / Function / Physical Skills  ADL;UE functional use;Fascial restriction;Pain;ROM;Strength    Plan  P: PVC pipe slide. Follow up on scapular theraband exercises.    Consulted and Agree with Plan of Care  Patient        Patient will benefit from skilled therapeutic intervention in order to improve the following deficits and impairments:   Body Structure / Function / Physical Skills: ADL, UE functional use, Fascial restriction, Pain, ROM, Strength       Visit Diagnosis: Acute pain of right shoulder  Stiffness of right shoulder, not elsewhere classified  Other symptoms and signs involving the musculoskeletal system    Problem List Patient Active Problem List   Diagnosis Date Noted  . 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 Cellulitis 02/24/2019  . 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 Osteogenesis imperfecta 12/25/2018  . Diabetes mellitus without complication [facility]) 12/25/2018   Limmie Patricia, OTR/L,CBIS  [phone]  01/26/2020, 4:20 PM  [facility] [address] [place], [place], [address] Phone: [phone]   Fax:  [phone]  Name: Kurt [name] MRN: [id number] Date of Birth: 09/17/1951

## 2020-01-26 NOTE — Patient Instructions (Signed)

## 2020-01-28 ENCOUNTER — Encounter (HOSPITAL_COMMUNITY): Payer: Self-pay | Admitting: Occupational Therapy

## 2020-01-28 ENCOUNTER — Other Ambulatory Visit: Payer: Self-pay

## 2020-01-28 ENCOUNTER — Ambulatory Visit (HOSPITAL_COMMUNITY): Payer: Medicare Other | Admitting: Occupational Therapy

## 2020-01-28 DIAGNOSIS — R29898 Other symptoms and signs involving the musculoskeletal system: Secondary | ICD-10-CM

## 2020-01-28 DIAGNOSIS — M25511 Pain in right shoulder: Secondary | ICD-10-CM

## 2020-01-28 DIAGNOSIS — M25611 Stiffness of right shoulder, not elsewhere classified: Secondary | ICD-10-CM | POA: Diagnosis not present

## 2020-01-28 NOTE — Therapy (Signed)
[place], [place], [address] Phone: [phone]   Fax:  [phone]  Occupational Therapy Treatment  Patient Details  Name: Kurt [name] MRN: [id number] Date of Birth: 10-05-1951 Referring Provider (OT): Dr. Marcene Duos   Encounter Date: 01/28/2020  OT End of Session - 01/28/20 1423    Visit Number  7    Number of Visits  12    Date for OT Re-Evaluation  02/18/20    Authorization Type  [employer] medicare    Authorization Time Period  no visit limit. $35 copay. Complete 10th visit progress note.    Authorization - Visit Number  7    Authorization - Number of Visits  10    OT Start Time  9024    OT Stop Time  1425    OT Time Calculation (min)  38 min    Activity Tolerance  Patient tolerated treatment well    Behavior During Therapy  WFL for tasks assessed/performed       Past Medical History:  Diagnosis Date  . Arthritis   . Bronchitis   . Bronchitis   . Concussion    late 1990's  after a fall  . Diabetes mellitus without complication ([facility])   . GERD (gastroesophageal reflux disease)   . History of kidney stones   . Hypertension   . Osteogenesis imperfecta   . PONV (postoperative nausea and vomiting)    pt has post polio syndrome  . Post-polio syndrome     Past Surgical History:  Procedure Laterality Date  . ANKLE FRACTURE SURGERY Bilateral   . arm surgery Right    nerve surgery  . COLONOSCOPY    . ELBOW FRACTURE SURGERY Left   . EYE MUSCLE SURGERY Left   . LEG SURGERY Left    femur fracture with rod  . REVERSE SHOULDER ARTHROPLASTY Right 11/10/2019   Procedure: RIGHT REVERSE SHOULDER ARTHROPLASTY;  Surgeon: Meredith Pel, MD;  Location: [facility];  Service: Orthopedics;  Laterality: Right;  . TONSILLECTOMY      There were no vitals filed for this visit.  Subjective Assessment - 01/28/20 1346    Subjective   S: It's been a little sore from exercises.    Currently in Pain?  Yes    Pain  Score  2     Pain Location  Shoulder    Pain Orientation  Right    Pain Descriptors / Indicators  Aching;Sore    Pain Type  Acute pain    Pain Radiating Towards  N/A    Pain Onset  In the past 7 days    Pain Frequency  Constant    Aggravating Factors   cold weather    Pain Relieving Factors  pain medication as needed    Effect of Pain on Daily Activities  mod effect-pt is cautious    Multiple Pain Sites  No         [facility] OT Assessment - 01/28/20 1345      Assessment   Medical Diagnosis  Right shoulder S/P reverse total repair      Precautions   Precautions  Shoulder    Type of Shoulder Precautions  P/ROM, A/ROM as tolerated. No lifting over 15# long term. Patient has a history of polo which effected his right side with decreased strength at baseline.                OT Treatments/Exercises (OP) - 01/28/20 1352  Exercises   Exercises  Shoulder      Shoulder Exercises: Seated   Extension  Theraband;10 reps    Theraband Level (Shoulder Extension)  Level 2 (Red)    Retraction  Theraband;10 reps    Theraband Level (Shoulder Retraction)  Level 2 (Red)    Row  Theraband;10 reps    Theraband Level (Shoulder Row)  Level 2 (Red)    Protraction  AROM;10 reps    Horizontal ABduction  AAROM;10 reps    External Rotation  AROM;10 reps    Internal Rotation  AROM;10 reps    Flexion  AAROM;10 reps    Abduction  AAROM;10 reps      Shoulder Exercises: Pulleys   Flexion  2 minutes   sitting   ABduction  2 minutes   sitting              OT Short Term Goals - 01/11/20 1506      OT SHORT TERM GOAL #1   Title  Patient will be educated and independent with HEP in order to increase functional use of RUE during daily tasks and faciliate progress in therapy.    Time  3    Period  Weeks    Status  On-going    Target Date  01/28/20      OT SHORT TERM GOAL #2   Title  Patient will increase RUE P/ROM to [facility] in order to increase ability to get shirts on and off with  less difficulty.    Time  3    Period  Weeks    Status  On-going      OT SHORT TERM GOAL #3   Title  Patient will increase RUE strength to 3+/5 in order to be able to complete lightweight lifting tasks at home.    Time  3    Period  Weeks    Status  On-going      OT SHORT TERM GOAL #4   Title  Patient will decrease pain level to 3/10 during functional use activities.    Time  3    Period  Weeks    Status  On-going      OT SHORT TERM GOAL #5   Title  Patient will decrease fascial restrictions to a minimal amount in the RUE in order to increase functional mobility needed to complete reaching tasks.    Time  3    Period  Weeks    Status  On-going        OT Long Term Goals - 01/11/20 1506      OT LONG TERM GOAL #1   Title  Patient will report increased independence with his RUE while using it for 75% or more of daily tasks.    Time  6    Period  Weeks    Status  On-going      OT LONG TERM GOAL #2   Title  Patient will increase RUE A/ROM to [facility] in order to complete reaching tasks at or above shoulder level.    Time  6    Period  Weeks    Status  On-going      OT LONG TERM GOAL #3   Title  Patient will increase RUE strength to 4/5 in order to return to lifting normal household items with less difficulty.    Time  6    Period  Weeks    Status  On-going      OT LONG TERM GOAL #4  Title  Patient will report a decrease in pain level of approximately 2/10 or less when utilizing his RUE for daily tasks.    Time  6    Period  Weeks    Status  On-going            Plan - 01/28/20 1413    Clinical Impression Statement  A: Pt reports he is trying to find a door that work for his band exercises. Continued with pulleys and red scapular theraband, progressed to A/ROM for protraction and er/IR and continued with AA/ROM for remainder of exercises. Increased time required, rest breaks provided as needed. Verbal cuing for form and technique.    Body Structure / Function /  Physical Skills  ADL;UE functional use;Fascial restriction;Pain;ROM;Strength    Plan  P: Continue with theraband exercises and follow up on HEP. Continue working to progress to A/ROM, attempt funcitonal reaching with pinch tree       Patient will benefit from skilled therapeutic intervention in order to improve the following deficits and impairments:   Body Structure / Function / Physical Skills: ADL, UE functional use, Fascial restriction, Pain, ROM, Strength       Visit Diagnosis: Acute pain of right shoulder  Stiffness of right shoulder, not elsewhere classified  Other symptoms and signs involving the musculoskeletal system    Problem List Patient Active Problem List   Diagnosis Date Noted  . 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 Cellulitis 02/24/2019  . 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 Osteogenesis imperfecta 12/25/2018  . Diabetes mellitus without complication [facility]) 12/25/2018   Ezra Sites, OTR/L  [phone] 01/28/2020, 2:25 PM  [facility] [address] [place], [place], [address] Phone: [phone]   Fax:  [phone]  Name: Kurt [name] MRN: [id number] Date of Birth: [birth year]

## 2020-02-01 ENCOUNTER — Telehealth (HOSPITAL_COMMUNITY): Payer: Self-pay

## 2020-02-01 ENCOUNTER — Ambulatory Visit (HOSPITAL_COMMUNITY): Payer: Medicare Other

## 2020-02-01 NOTE — Telephone Encounter (Signed)
pt cancelled appt for today because he is having car trouble

## 2020-02-01 NOTE — Progress Notes (Signed)
[facility] - Clinic Note  02/02/2020     CHIEF COMPLAINT Patient presents for Retina Follow Up   HISTORY OF PRESENT ILLNESS: Kurt [name] is a 69 y.o. male who presents to the clinic today for:   HPI    Retina Follow Up    Patient presents with  CRVO/BRVO.  In right eye.  This started weeks ago.  Severity is moderate.  Duration of weeks.  Since onset it is stable.  I, the attending physician,  performed the HPI with the patient and updated documentation appropriately.          Comments    Pt states vision is the same OU.  Pt denies eye pain or discomfort and denies any new or worsening floaters or fol OU.       Last edited by Rennis Chris, MD on 02/02/2020  8:45 PM. (History)    Patient states his eye ached after the injection he received at last visit, he states he feels like his vision has gotten better   Referring physician: Altamease Oiler, FNP [address] [place],  [place] [address]  HISTORICAL INFORMATION:   Selected notes from the MEDICAL RECORD NUMBER Diabetic Eval per Dr. Karleen Hampshire   CURRENT MEDICATIONS: Current Outpatient Medications (Ophthalmic Drugs)  Medication Sig  . Olopatadine HCl 0.7 % SOLN Apply to eye.   No current facility-administered medications for this visit. (Ophthalmic Drugs)   Current Outpatient Medications (Other)  Medication Sig  . acetaminophen (TYLENOL) 650 MG CR tablet Take 1,300 mg by mouth every 8 (eight) hours as needed for pain.  Marland Kitchen aspirin 81 MG chewable tablet Chew 1 tablet (81 mg total) by mouth daily.  Marland Kitchen glipiZIDE (GLUCOTROL XL) 10 MG 24 hr tablet Take 10 mg by mouth at bedtime.   Marland Kitchen LANTUS SOLOSTAR 100 UNIT/ML Solostar Pen Inject 33 Units into the skin at bedtime.   Marland Kitchen lisinopril (PRINIVIL,ZESTRIL) 20 MG tablet Take 20 mg by mouth daily.  . methocarbamol (ROBAXIN) 500 MG tablet Take 500 mg by mouth at bedtime.   Marland Kitchen oxyCODONE (OXY IR/ROXICODONE) 5 MG immediate release tablet Take 1 tablet (5 mg total) by  mouth every 8 (eight) hours as needed for moderate pain (pain score 4-6).  Marland Kitchen simvastatin (ZOCOR) 20 MG tablet Take 20 mg by mouth every evening.  . SitaGLIPtin-MetFORMIN HCl (JANUMET XR) [id number] MG TB24 Take 1 tablet by mouth at bedtime.   No current facility-administered medications for this visit. (Other)      REVIEW OF SYSTEMS: ROS    Positive for: Gastrointestinal, Musculoskeletal, Endocrine, Eyes   Negative for: Constitutional, Neurological, Skin, Genitourinary, HENT, Cardiovascular, Respiratory, Psychiatric, Allergic/Imm, Heme/Lymph   Last edited by Corrinne Eagle on 02/02/2020  1:57 PM. (History)       ALLERGIES Allergies  Allergen Reactions  . Augmentin [Amoxicillin-Pot Clavulanate] Nausea And Vomiting  . Iodine Hives  . Tape Rash    Paper     PAST MEDICAL HISTORY Past Medical History:  Diagnosis Date  . Arthritis   . Bronchitis   . Bronchitis   . Concussion    late 1990's  after a fall  . Diabetes mellitus without complication ([facility])   . GERD (gastroesophageal reflux disease)   . History of kidney stones   . Hypertension   . Osteogenesis imperfecta   . PONV (postoperative nausea and vomiting)    pt has post polio syndrome  . Post-polio syndrome    Past Surgical History:  Procedure  Laterality Date  . ANKLE FRACTURE SURGERY Bilateral   . arm surgery Right    nerve surgery  . COLONOSCOPY    . ELBOW FRACTURE SURGERY Left   . EYE MUSCLE SURGERY Left   . LEG SURGERY Left    femur fracture with rod  . REVERSE SHOULDER ARTHROPLASTY Right 11/10/2019   Procedure: RIGHT REVERSE SHOULDER ARTHROPLASTY;  Surgeon: Cammy Copa, MD;  Location: [facility] OR;  Service: Orthopedics;  Laterality: Right;  . TONSILLECTOMY      FAMILY HISTORY Family History  Problem Relation Age of Onset  . Hypertension Mother   . Diabetes Brother     SOCIAL HISTORY Social History   Tobacco Use  . Smoking status: Never Smoker  . Smokeless tobacco: Never Used  Substance Use  Topics  . Alcohol use: Yes    Comment: 1 beer occasionally  . Drug use: No         OPHTHALMIC EXAM:  Base Eye Exam    Visual Acuity (Snellen - Linear)      Right Left   Dist cc 20/40 -1 20/25 -2   Dist ph cc 20/30 +1 20/20 -2       Tonometry (Tonopen, 2:02 PM)      Right Left   Pressure 14 13       Pupils      Dark Light Shape React APD   Right 4 3 Round Brisk 0   Left 4 3 Round Brisk 0       Visual Fields      Left Right    Full Full       Extraocular Movement      Right Left    Full Full       Neuro/Psych    Oriented x3: Yes   Mood/Affect: Normal       Dilation    Both eyes: 1.0% Mydriacyl, 2.5% Phenylephrine @ 2:02 PM        Slit Lamp and Fundus Exam    Slit Lamp Exam      Right Left   Lids/Lashes Dermatochalasis - upper lid, Dermatochalasis - lower lid, mild Meibomian gland dysfunction Dermatochalasis - upper lid, Dermatochalasis - lower lid, mild Meibomian gland dysfunction   Conjunctiva/Sclera blue sclera blue sclera   Cornea arcus arcus   Anterior Chamber deep and clear deep and clear   Iris round and dilated, no NVI round and dilated, no NVI   Lens 2+ NS, 2+CS 2+ NS, 2+CS   Vitreous mild syneresis, PVD mild syneresis       Fundus Exam      Right Left   Disc pink and sharp pink and sharp   C/D Ratio 0.3 0.3   Macula blunted foveal reflex, focal edema w/ IRH, MA, and CWS superonasal macula -- improved flat, good foveal reflex, mild RPE mottling and clumping, +drusen, No heme or edema   Vessels mild attenuation, +A/V crossing changes, focal BRVO superior macula, mild Tortuousity mild attenuation and tortuosity   Periphery attached, no heme attached, no heme        Refraction    Wearing Rx      Sphere Cylinder Axis Add   Right -2.00 +2.25 111 +2.75   Left -1.50 +1.50 094 2.75          IMAGING AND PROCEDURES  Imaging and Procedures for @TODAY @  OCT, Retina - OU - Both Eyes       Right Eye Quality was good. Central Foveal  Thickness: 285.  Progression has improved. Findings include no SRF, intraretinal fluid, retinal drusen , normal foveal contour (Interval improvement in IRF SN macula).   Left Eye Quality was good. Central Foveal Thickness: 262. Progression has been stable. Findings include normal foveal contour, no IRF, no SRF, vitreomacular adhesion , retinal drusen .   Notes *Images captured and stored on drive  Diagnosis / Impression:  OD: BRVO with focal CME superonasal macula -- Interval improvement in IRF SN macula OS: NFP, no SRF/IRF   Clinical management:  See below  Abbreviations: NFP - Normal foveal profile. CME - cystoid macular edema. PED - pigment epithelial detachment. IRF - intraretinal fluid. SRF - subretinal fluid. EZ - ellipsoid zone. ERM - epiretinal membrane. ORA - outer retinal atrophy. ORT - outer retinal tubulation. SRHM - subretinal hyper-reflective material         Intravitreal Injection, Pharmacologic Agent - OD - Right Eye       Time Out 02/02/2020. 2:55 PM. Confirmed correct patient, procedure, site, and patient consented.   Anesthesia Topical anesthesia was used. Anesthetic medications included Lidocaine 2%, Proparacaine 0.5%.   Procedure Preparation included 5% betadine to ocular surface, eyelid speculum. A supplied needle was used.   Injection:  1.25 mg Bevacizumab (AVASTIN) SOLN   NDC: 70360-001-02, Lot: [id number] , Expiration date: 03/02/2020   Route: Intravitreal, Site: Right Eye, Waste: 0 mL  Post-op Post injection exam found visual acuity of at least counting fingers. The patient tolerated the procedure well. There were no complications. The patient received written and verbal post procedure care education.                 ASSESSMENT/PLAN:    ICD-10-CM   1. Branch retinal vein occlusion of right eye with macular edema  H34.8310 Intravitreal Injection, Pharmacologic Agent - OD - Right Eye    Bevacizumab (AVASTIN) SOLN 1.25 mg  2. Retinal edema   H35.81 OCT, Retina - OU - Both Eyes  3. Diabetes mellitus type 2 without retinopathy ([place])  E11.9   4. Essential hypertension  I10   5. Hypertensive retinopathy of both eyes  H35.033   6. Combined forms of age-related cataract of both eyes  H25.813     1,2. BRVO with CME OD  - s/p IVA OD #1 (01.19.21)  - BCVA 20/25-2, today 01.19.21  - exam with focal edema, IRH, CWS superonasal macula  - OCT shows interval improvement in IRF SN macula  - recommend IVA OD #2 today, 2.16.21  - RBA of procedure discussed, questions answered  - informed consent obtained and signed  - see procedure note  - F/U 4 weeks -- DFE/OCT/possible injection  3. Diabetes mellitus, type 2 without retinopathy OU  - The incidence, risk factors for progression, natural history and treatment options for diabetic retinopathy  were discussed with patient.    - The need for close monitoring of blood glucose, blood pressure, and serum lipids, avoiding cigarette or any type of tobacco, and the need for long term follow up was also discussed with patient.  - f/u in 1 year, sooner prn  4,5. Hypertensive retinopathy OU  - discussed importance of tight BP control  - monitor  6. Age related cataracts OU   - The symptoms of cataract, surgical options, and treatments and risks were discussed with patient.  - discussed diagnosis and progression  - not yet visually significant  - monitor for now   Ophthalmic Meds Ordered this visit:  Meds ordered this encounter  Medications  . Bevacizumab (  AVASTIN) SOLN 1.25 mg       Return in about 4 weeks (around 03/01/2020) for f/u BRVO with CME OD, DFE, OCT.  There are no Patient Instructions on file for this visit.   Explained the diagnoses, plan, and follow up with the patient and they expressed understanding.  Patient expressed understanding of the importance of proper follow up care.   This document serves as a record of services personally performed by Karie Chimera, MD,  PhD. It was created on their behalf by Cristopher Estimable, COT an ophthalmic technician. The creation of this record is the provider's dictation and/or activities during the visit.    Electronically signed by: Cristopher Estimable, COT 02/01/20 @ 8:49 PM   This document serves as a record of services personally performed by Karie Chimera, MD, PhD. It was created on their behalf by Laurian Brim, OA, an ophthalmic assistant. The creation of this record is the provider's dictation and/or activities during the visit.    Electronically signed by: Laurian Brim, OA 02.16.2021 8:49 PM  Karie Chimera, M.D., Ph.D. Diseases & Surgery of the Retina and Vitreous [facility] 02/02/2020   I have reviewed the above documentation for accuracy and completeness, and I agree with the above. Karie Chimera, M.D., Ph.D. 02/02/20 8:49 PM   Abbreviations: M myopia (nearsighted); A astigmatism; H hyperopia (farsighted); P presbyopia; Mrx spectacle prescription;  CTL contact lenses; OD right eye; OS left eye; OU both eyes  XT exotropia; ET esotropia; PEK punctate epithelial keratitis; PEE punctate epithelial erosions; DES dry eye syndrome; MGD meibomian gland dysfunction; ATs artificial tears; PFAT's preservative free artificial tears; NSC nuclear sclerotic cataract; PSC posterior subcapsular cataract; ERM epi-retinal membrane; PVD posterior vitreous detachment; RD retinal detachment; DM diabetes mellitus; DR diabetic retinopathy; NPDR non-proliferative diabetic retinopathy; PDR proliferative diabetic retinopathy; CSME clinically significant macular edema; DME diabetic macular edema; dbh dot blot hemorrhages; CWS cotton wool spot; POAG primary open angle glaucoma; C/D cup-to-disc ratio; HVF humphrey visual field; GVF goldmann visual field; OCT optical coherence tomography; IOP intraocular pressure; BRVO Branch retinal vein occlusion; CRVO central retinal vein occlusion; CRAO central retinal artery occlusion;  BRAO branch retinal artery occlusion; RT retinal tear; SB scleral buckle; PPV pars plana vitrectomy; VH Vitreous hemorrhage; PRP panretinal laser photocoagulation; IVK intravitreal kenalog; VMT vitreomacular traction; MH Macular hole;  NVD neovascularization of the disc; NVE neovascularization elsewhere; AREDS age related eye disease study; ARMD age related macular degeneration; POAG primary open angle glaucoma; EBMD epithelial/anterior basement membrane dystrophy; ACIOL anterior chamber intraocular lens; IOL intraocular lens; PCIOL posterior chamber intraocular lens; Phaco/IOL phacoemulsification with intraocular lens placement; PRK photorefractive keratectomy; LASIK laser assisted in situ keratomileusis; HTN hypertension; DM diabetes mellitus; COPD chronic obstructive pulmonary disease

## 2020-02-02 ENCOUNTER — Ambulatory Visit (INDEPENDENT_AMBULATORY_CARE_PROVIDER_SITE_OTHER): Payer: Medicare Other | Admitting: Ophthalmology

## 2020-02-02 ENCOUNTER — Encounter (HOSPITAL_COMMUNITY): Payer: Medicare Other | Admitting: Occupational Therapy

## 2020-02-02 ENCOUNTER — Encounter (INDEPENDENT_AMBULATORY_CARE_PROVIDER_SITE_OTHER): Payer: Self-pay | Admitting: Ophthalmology

## 2020-02-02 DIAGNOSIS — H25813 Combined forms of age-related cataract, bilateral: Secondary | ICD-10-CM

## 2020-02-02 DIAGNOSIS — H35033 Hypertensive retinopathy, bilateral: Secondary | ICD-10-CM

## 2020-02-02 DIAGNOSIS — I10 Essential (primary) hypertension: Secondary | ICD-10-CM

## 2020-02-02 DIAGNOSIS — E119 Type 2 diabetes mellitus without complications: Secondary | ICD-10-CM | POA: Diagnosis not present

## 2020-02-02 DIAGNOSIS — H3581 Retinal edema: Secondary | ICD-10-CM | POA: Diagnosis not present

## 2020-02-02 DIAGNOSIS — H34831 Tributary (branch) retinal vein occlusion, right eye, with macular edema: Secondary | ICD-10-CM | POA: Diagnosis not present

## 2020-02-02 MED ORDER — BEVACIZUMAB CHEMO INJECTION 1.25MG/0.05ML SYRINGE FOR KALEIDOSCOPE
1.2500 mg | INTRAVITREAL | Status: AC | PRN
  Administered 2020-02-02: 21:00:00 1.25 mg via INTRAVITREAL

## 2020-02-04 ENCOUNTER — Ambulatory Visit: Payer: Medicare Other | Admitting: Orthopedic Surgery

## 2020-02-04 ENCOUNTER — Ambulatory Visit (HOSPITAL_COMMUNITY): Payer: Medicare Other | Admitting: Occupational Therapy

## 2020-02-08 ENCOUNTER — Other Ambulatory Visit: Payer: Self-pay

## 2020-02-08 ENCOUNTER — Ambulatory Visit (HOSPITAL_COMMUNITY): Payer: Medicare Other

## 2020-02-08 ENCOUNTER — Telehealth (HOSPITAL_COMMUNITY): Payer: Self-pay

## 2020-02-08 ENCOUNTER — Encounter (HOSPITAL_COMMUNITY): Payer: Self-pay

## 2020-02-08 DIAGNOSIS — R29898 Other symptoms and signs involving the musculoskeletal system: Secondary | ICD-10-CM

## 2020-02-08 DIAGNOSIS — M25511 Pain in right shoulder: Secondary | ICD-10-CM

## 2020-02-08 DIAGNOSIS — M25611 Stiffness of right shoulder, not elsewhere classified: Secondary | ICD-10-CM

## 2020-02-08 NOTE — Therapy (Signed)
[facility] [address] [place], [place], [address] Phone: [phone]   Fax:  [phone]  Occupational Therapy Treatment  Patient Details  Name: Kurt [name] MRN: [id number] Date of Birth: 11-27-1951 Referring Provider (OT): Dr. Rise Paganini   Encounter Date: 02/08/2020  OT End of Session - 02/08/20 1544    Visit Number  8    Number of Visits  12    Date for OT Re-Evaluation  02/18/20    Authorization Type  [employer] medicare    Authorization Time Period  no visit limit. $35 copay. Complete 10th visit progress note.    Authorization - Visit Number  8    Authorization - Number of Visits  10    OT Start Time  1435   Pt was in the bathroom prior to starting session   OT Stop Time  1516    OT Time Calculation (min)  41 min    Activity Tolerance  Patient tolerated treatment well    Behavior During Therapy  [facility] for tasks assessed/performed       Past Medical History:  Diagnosis Date  . Arthritis   . Bronchitis   . Bronchitis   . Concussion    late 1990's  after a fall  . Diabetes mellitus without complication (HCC)   . GERD (gastroesophageal reflux disease)   . History of kidney stones   . Hypertension   . Osteogenesis imperfecta   . PONV (postoperative nausea and vomiting)    pt has post polio syndrome  . Post-polio syndrome     Past Surgical History:  Procedure Laterality Date  . ANKLE FRACTURE SURGERY Bilateral   . arm surgery Right    nerve surgery  . COLONOSCOPY    . ELBOW FRACTURE SURGERY Left   . EYE MUSCLE SURGERY Left   . LEG SURGERY Left    femur fracture with rod  . REVERSE SHOULDER ARTHROPLASTY Right 11/10/2019   Procedure: RIGHT REVERSE SHOULDER ARTHROPLASTY;  Surgeon: Cammy Copa, MD;  Location: [facility] OR;  Service: Orthopedics;  Laterality: Right;  . TONSILLECTOMY      There were no vitals filed for this visit.  Subjective Assessment - 02/08/20 1443    Subjective   S: My shoulder has been a  little sore in the front.    Currently in Pain?  Yes    Pain Score  3     Pain Location  Shoulder    Pain Orientation  Right    Pain Descriptors / Indicators  Sore    Pain Type  Acute pain    Pain Radiating Towards  N/A    Pain Onset  In the past 7 days    Pain Frequency  Occasional    Aggravating Factors   weather, when it's tired.    Pain Relieving Factors  pain medication as needed.    Effect of Pain on Daily Activities  mod effect - pt is cautious    Multiple Pain Sites  No         [facility] OT Assessment - 02/08/20 1441      Assessment   Medical Diagnosis  Right shoulder S/P reverse total repair      Precautions   Precautions  Shoulder    Type of Shoulder Precautions  P/ROM, A/ROM as tolerated. No lifting over 15# long term. Patient has a history of polo which effected his right side with decreased strength at baseline.       ROM /  Strength   AROM / PROM / Strength  AROM;Strength      AROM   AROM Assessment Site  Shoulder    Right/Left Shoulder  Right    Right Shoulder Flexion  125 Degrees   previous: 130   Right Shoulder ABduction  125 Degrees   previous: 94   Right Shoulder Internal Rotation  90 Degrees   previous: 75   Right Shoulder External Rotation  45 Degrees   previous: 10     Strength   Strength Assessment Site  Shoulder    Right/Left Shoulder  Right    Right Shoulder Flexion  3/5   previous: 3/5   Right Shoulder ABduction  3+/5   previous: 3-/5   Right Shoulder Internal Rotation  4+/5   previous: 3-/5   Right Shoulder External Rotation  3/5   previous: 3-/5              OT Treatments/Exercises (OP) - 02/08/20 1441      Exercises   Exercises  Shoulder      Shoulder Exercises: Seated   Extension  Theraband;10 reps    Theraband Level (Shoulder Extension)  Level 2 (Red)    Retraction  Theraband;10 reps    Theraband Level (Shoulder Retraction)  Level 2 (Red)    Row  Theraband;10 reps    Theraband Level (Shoulder Row)  Level 2 (Red)     Protraction  AROM;12 reps    Horizontal ABduction  AAROM;12 reps    External Rotation  AROM;12 reps    Internal Rotation  AROM;12 reps    Flexion  AAROM;12 reps    Abduction  AAROM;12 reps      Functional Reaching Activities   Mid Level  resistive clothespins used while seated to complete functional reaching task. patient able to reach to 47 cm               OT Short Term Goals - 01/11/20 1506      OT SHORT TERM GOAL #1   Title  Patient will be educated and independent with HEP in order to increase functional use of RUE during daily tasks and faciliate progress in therapy.    Time  3    Period  Weeks    Status  On-going    Target Date  01/28/20      OT SHORT TERM GOAL #2   Title  Patient will increase RUE P/ROM to [facility] in order to increase ability to get shirts on and off with less difficulty.    Time  3    Period  Weeks    Status  On-going      OT SHORT TERM GOAL #3   Title  Patient will increase RUE strength to 3+/5 in order to be able to complete lightweight lifting tasks at home.    Time  3    Period  Weeks    Status  On-going      OT SHORT TERM GOAL #4   Title  Patient will decrease pain level to 3/10 during functional use activities.    Time  3    Period  Weeks    Status  On-going      OT SHORT TERM GOAL #5   Title  Patient will decrease fascial restrictions to a minimal amount in the RUE in order to increase functional mobility needed to complete reaching tasks.    Time  3    Period  Weeks    Status  On-going  OT Long Term Goals - 01/11/20 1506      OT LONG TERM GOAL #1   Title  Patient will report increased independence with his RUE while using it for 75% or more of daily tasks.    Time  6    Period  Weeks    Status  On-going      OT LONG TERM GOAL #2   Title  Patient will increase RUE A/ROM to [facility] in order to complete reaching tasks at or above shoulder level.    Time  6    Period  Weeks    Status  On-going      OT LONG TERM GOAL #3    Title  Patient will increase RUE strength to 4/5 in order to return to lifting normal household items with less difficulty.    Time  6    Period  Weeks    Status  On-going      OT LONG TERM GOAL #4   Title  Patient will report a decrease in pain level of approximately 2/10 or less when utilizing his RUE for daily tasks.    Time  6    Period  Weeks    Status  On-going            Plan - 02/08/20 1545    Clinical Impression Statement  A:measurements and strength assessed seated at end of session for MD appoinment on Wednesday. Will complete review goals next session due to time constraint. Patient was able to complete AA/ROM seated with an increase in repetitions. Overall demonstrating decreased pain and increased A/ROM from initial start of therapy. VC for form and technique were provided.    Body Structure / Function / Physical Skills  ADL;UE functional use;Fascial restriction;Pain;ROM;Strength    Plan  P: Complete FOTO. Review goals. Measure Passive ROM supine.    Consulted and Agree with Plan of Care  Patient       Patient will benefit from skilled therapeutic intervention in order to improve the following deficits and impairments:   Body Structure / Function / Physical Skills: ADL, UE functional use, Fascial restriction, Pain, ROM, Strength       Visit Diagnosis: Stiffness of right shoulder, not elsewhere classified  Other symptoms and signs involving the musculoskeletal system  Acute pain of right shoulder    Problem List Patient Active Problem List   Diagnosis Date Noted  . 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 Cellulitis 02/24/2019  . 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 Osteogenesis imperfecta 12/25/2018  . Diabetes mellitus without complication [facility]) 12/25/2018   Limmie Patricia, OTR/L,CBIS  [phone]  02/08/2020, 3:49 PM  [facility] [address] [place], [place], [address] Phone: [phone]   Fax:   [phone]  Name: Kurt [name] MRN: [id number] Date of Birth: 1951-08-20

## 2020-02-08 NOTE — Telephone Encounter (Signed)
pt wanted to cancel weds appt due to a conflicting appt

## 2020-02-10 ENCOUNTER — Ambulatory Visit (INDEPENDENT_AMBULATORY_CARE_PROVIDER_SITE_OTHER): Payer: Medicare Other | Admitting: Orthopedic Surgery

## 2020-02-10 ENCOUNTER — Ambulatory Visit (HOSPITAL_COMMUNITY): Payer: Medicare Other

## 2020-02-10 ENCOUNTER — Other Ambulatory Visit: Payer: Self-pay

## 2020-02-10 DIAGNOSIS — Z96611 Presence of right artificial shoulder joint: Secondary | ICD-10-CM

## 2020-02-13 ENCOUNTER — Encounter: Payer: Self-pay | Admitting: Orthopedic Surgery

## 2020-02-13 NOTE — Progress Notes (Signed)
   Post-Op Visit Note   Patient: Kurt [name]           Date of Birth: January 11, 1951           MRN: [id number] Visit Date: 02/10/2020 PCP: Altamease Oiler, FNP   Assessment & Plan:  Chief Complaint:  Chief Complaint  Patient presents with  . Right Shoulder - Routine Post Op   Visit Diagnoses:  1. Status post reverse total replacement of right shoulder     Plan: Ariana is now about 3 months out reverse shoulder replacement on the right.  He is making progress in therapy.  Rates his pain is level 2 out of 10.  Sleeps on the right-hand side.  Having some balance issues.  He is continuing to work in physical therapy.  On examination he has 30 degrees of external rotation passively greater than 90 degrees of forward flexion and about 90 degrees of abduction.  Plan at this time is continue with therapy and home exercise program.  Follow-up as needed.  Need for prophylaxis before dental procedures discussed.  I think overall he is done well.  Follow-Up Instructions: No follow-ups on file.   Orders:  No orders of the defined types were placed in this encounter.  No orders of the defined types were placed in this encounter.   Imaging: No results found.  PMFS History: Patient Active Problem List   Diagnosis Date Noted  . 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 Cellulitis 02/24/2019  . 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 Osteogenesis imperfecta 12/25/2018  . Diabetes mellitus without complication (HCC) 12/25/2018   Past Medical History:  Diagnosis Date  . Arthritis   . Bronchitis   . Bronchitis   . Concussion    late 1990's  after a fall  . Diabetes mellitus without complication (HCC)   . GERD (gastroesophageal reflux disease)   . History of kidney stones   . Hypertension   . Osteogenesis imperfecta   . PONV (postoperative nausea and vomiting)    pt has post polio syndrome  . Post-polio syndrome     Family History  Problem Relation Age of Onset  . Hypertension Mother   .  Diabetes Brother     Past Surgical History:  Procedure Laterality Date  . ANKLE FRACTURE SURGERY Bilateral   . arm surgery Right    nerve surgery  . COLONOSCOPY    . ELBOW FRACTURE SURGERY Left   . EYE MUSCLE SURGERY Left   . LEG SURGERY Left    femur fracture with rod  . REVERSE SHOULDER ARTHROPLASTY Right 11/10/2019   Procedure: RIGHT REVERSE SHOULDER ARTHROPLASTY;  Surgeon: Cammy Copa, MD;  Location: [facility] OR;  Service: Orthopedics;  Laterality: Right;  . TONSILLECTOMY     Social History   Occupational History  . Not on file  Tobacco Use  . Smoking status: Never Smoker  . Smokeless tobacco: Never Used  Substance and Sexual Activity  . Alcohol use: Yes    Comment: 1 beer occasionally  . Drug use: No  . Sexual activity: Not on file

## 2020-02-15 ENCOUNTER — Encounter (HOSPITAL_COMMUNITY): Payer: Self-pay

## 2020-02-15 ENCOUNTER — Ambulatory Visit (HOSPITAL_COMMUNITY): Payer: Medicare Other | Attending: Orthopedic Surgery

## 2020-02-15 ENCOUNTER — Other Ambulatory Visit: Payer: Self-pay

## 2020-02-15 DIAGNOSIS — M25511 Pain in right shoulder: Secondary | ICD-10-CM | POA: Diagnosis present

## 2020-02-15 DIAGNOSIS — M25611 Stiffness of right shoulder, not elsewhere classified: Secondary | ICD-10-CM | POA: Diagnosis present

## 2020-02-15 DIAGNOSIS — R29898 Other symptoms and signs involving the musculoskeletal system: Secondary | ICD-10-CM | POA: Insufficient documentation

## 2020-02-15 NOTE — Therapy (Addendum)
[facility] [address] [place], [place], [address] Phone: [phone]   Fax:  [phone]  Occupational Therapy Treatment  Patient Details  Name: Kurt [name] MRN: [id number] Date of Birth: 11-25-1951 Referring Provider (OT): Dr. Marcene Duos  Progress Note Reporting Period 01/07/2020 to 02/15/2020  See note below for Objective Data and Assessment of Progress/Goals.      Encounter Date: 02/15/2020  OT End of Session - 02/15/20 1453    Visit Number  9    Number of Visits  12    Date for OT Re-Evaluation  03/17/20    Authorization Type  [employer] medicare    Authorization Time Period  no visit limit. $35 copay.    Progress Note Due on Visit  19    OT Start Time  1430    OT Stop Time  1510    OT Time Calculation (min)  40 min    Activity Tolerance  Patient tolerated treatment well    Behavior During Therapy  WFL for tasks assessed/performed       Past Medical History:  Diagnosis Date  . Arthritis   . Bronchitis   . Bronchitis   . Concussion    late 1990's  after a fall  . Diabetes mellitus without complication ([place])   . GERD (gastroesophageal reflux disease)   . History of kidney stones   . Hypertension   . Osteogenesis imperfecta   . PONV (postoperative nausea and vomiting)    pt has post polio syndrome  . Post-polio syndrome     Past Surgical History:  Procedure Laterality Date  . ANKLE FRACTURE SURGERY Bilateral   . arm surgery Right    nerve surgery  . COLONOSCOPY    . ELBOW FRACTURE SURGERY Left   . EYE MUSCLE SURGERY Left   . LEG SURGERY Left    femur fracture with rod  . REVERSE SHOULDER ARTHROPLASTY Right 11/10/2019   Procedure: RIGHT REVERSE SHOULDER ARTHROPLASTY;  Surgeon: Meredith Pel, MD;  Location: [facility];  Service: Orthopedics;  Laterality: Right;  . TONSILLECTOMY      There were no vitals filed for this visit.  Subjective Assessment - 02/15/20 1453    Subjective   S: The doctor released me.  He won't see me for another year.    Currently in Pain?  No/denies         [facility] OT Assessment - 02/15/20 1438      Assessment   Medical Diagnosis  Right shoulder S/P reverse total repair      Precautions   Precautions  Shoulder    Type of Shoulder Precautions  P/ROM, A/ROM as tolerated. No lifting over 15# long term. Patient has a history of polo which effected his right side with decreased strength at baseline.       Observation/Other Assessments   Focus on Therapeutic Outcomes (FOTO)   58/100      PROM   Overall PROM Comments  Assessed supine. IR/er adducted    PROM Assessment Site  Shoulder    Right/Left Shoulder  Right    Right Shoulder Flexion  144 Degrees   previous: 110   Right Shoulder ABduction  131 Degrees   previous: 121   Right Shoulder Internal Rotation  80 Degrees   previous: 75   Right Shoulder External Rotation  70 Degrees   previous: 60              OT Treatments/Exercises (OP) - 02/15/20 1454  Exercises   Exercises  Shoulder      Shoulder Exercises: Seated   Protraction  AROM;12 reps    Horizontal ABduction  AAROM;12 reps    External Rotation  AROM;12 reps    Internal Rotation  AROM;12 reps    Flexion  AAROM;12 reps    Abduction  AAROM;12 reps               OT Short Term Goals - 02/15/20 1445      OT SHORT TERM GOAL #1   Title  Patient will be educated and independent with HEP in order to increase functional use of RUE during daily tasks and faciliate progress in therapy.    Time  3    Period  Weeks    Status  Achieved    Target Date  01/28/20      OT SHORT TERM GOAL #2   Title  Patient will increase RUE P/ROM to [facility] in order to increase ability to get shirts on and off with less difficulty.    Time  3    Period  Weeks    Status  Achieved      OT SHORT TERM GOAL #3   Title  Patient will increase RUE strength to 3+/5 in order to be able to complete lightweight lifting tasks at home.    Time  3    Period  Weeks     Status  Achieved      OT SHORT TERM GOAL #4   Title  Patient will decrease pain level to 3/10 during functional use activities.    Time  3    Period  Weeks    Status  Achieved      OT SHORT TERM GOAL #5   Title  Patient will decrease fascial restrictions to a minimal amount in the RUE in order to increase functional mobility needed to complete reaching tasks.    Time  3    Period  Weeks    Status  Achieved        OT Long Term Goals - 02/15/20 1448      OT LONG TERM GOAL #1   Title  Patient will report increased independence with his RUE while using it for 75% or more of daily tasks.    Time  6    Period  Weeks    Status  On-going      OT LONG TERM GOAL #2   Title  Patient will increase RUE A/ROM to [facility] in order to complete reaching tasks at or above shoulder level.    Time  6    Period  Weeks    Status  On-going      OT LONG TERM GOAL #3   Title  Patient will increase RUE strength to 4/5 in order to return to lifting normal household items with less difficulty.    Time  6    Period  Weeks    Status  On-going      OT LONG TERM GOAL #4   Title  Patient will report a decrease in pain level of approximately 2/10 or less when utilizing his RUE for daily tasks.    Time  6    Period  Weeks    Status  On-going            Plan - 02/15/20 1553    Clinical Impression Statement  A: 10th visit progress note completed this date. patient has met all short term goals and  progressing towards long term goals. He recently had a follow up appointment with the MD who has released him to continue with OT then transition to HEP. Patient has shown improvement with ROM and strength although continues to have deficits in both areas. Patient experiences pain with certain movements which will limit his use of his Right UE. Recommended that patient continue for 4 more weeks and decrease to once a week to focus on mentioned deficits. patient is in agreement.    Body Structure / Function /  Physical Skills  ADL;UE functional use;Fascial restriction;Pain;ROM;Strength    OT Frequency  1x / week    OT Duration  4 weeks    Plan  P: Complete functional reaching tasks for first level of shelf. PVC pipe slide.    Consulted and Agree with Plan of Care  Patient       Patient will benefit from skilled therapeutic intervention in order to improve the following deficits and impairments:   Body Structure / Function / Physical Skills: ADL, UE functional use, Fascial restriction, Pain, ROM, Strength       Visit Diagnosis: Stiffness of right shoulder, not elsewhere classified - Plan: Ot plan of care cert/re-cert  Other symptoms and signs involving the musculoskeletal system - Plan: Ot plan of care cert/re-cert  Acute pain of right shoulder - Plan: Ot plan of care cert/re-cert    Problem List Patient Active Problem List   Diagnosis Date Noted  . 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 Cellulitis 02/24/2019  . 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 Osteogenesis imperfecta 12/25/2018  . Diabetes mellitus without complication [facility]) 42/55/2589   Ailene Ravel, OTR/L,CBIS  [phone]  02/15/2020, 4:00 PM  [facility] [address] [place], [place], [address] Phone: [phone]   Fax:  [phone]  Name: MAVIN DYKE MRN: [id number] Date of Birth: 1951-07-27

## 2020-02-17 ENCOUNTER — Other Ambulatory Visit: Payer: Self-pay

## 2020-02-17 ENCOUNTER — Encounter (HOSPITAL_COMMUNITY): Payer: Self-pay

## 2020-02-17 ENCOUNTER — Ambulatory Visit (HOSPITAL_COMMUNITY): Payer: Medicare Other

## 2020-02-17 DIAGNOSIS — R29898 Other symptoms and signs involving the musculoskeletal system: Secondary | ICD-10-CM

## 2020-02-17 DIAGNOSIS — M25611 Stiffness of right shoulder, not elsewhere classified: Secondary | ICD-10-CM | POA: Diagnosis not present

## 2020-02-17 DIAGNOSIS — M25511 Pain in right shoulder: Secondary | ICD-10-CM

## 2020-02-17 NOTE — Therapy (Signed)
[place], [place], [address] Phone: [phone]   Fax:  [phone]  Occupational Therapy Treatment  Patient Details  Name: Kurt [name] MRN: [id number] Date of Birth: August 31, 1951 Referring Provider (OT): Dr. Marcene Duos   Encounter Date: 02/17/2020  OT End of Session - 02/17/20 1538    Visit Number  10    Number of Visits  12    Date for OT Re-Evaluation  03/17/20    Authorization Type  [employer] medicare    Authorization Time Period  no visit limit. $35 copay.    Progress Note Due on Visit  19    OT Start Time  1433    OT Stop Time  1511    OT Time Calculation (min)  38 min    Activity Tolerance  Patient tolerated treatment well    Behavior During Therapy  WFL for tasks assessed/performed       Past Medical History:  Diagnosis Date  . Arthritis   . Bronchitis   . Bronchitis   . Concussion    late 1990's  after a fall  . Diabetes mellitus without complication ([place])   . GERD (gastroesophageal reflux disease)   . History of kidney stones   . Hypertension   . Osteogenesis imperfecta   . PONV (postoperative nausea and vomiting)    pt has post polio syndrome  . Post-polio syndrome     Past Surgical History:  Procedure Laterality Date  . ANKLE FRACTURE SURGERY Bilateral   . arm surgery Right    nerve surgery  . COLONOSCOPY    . ELBOW FRACTURE SURGERY Left   . EYE MUSCLE SURGERY Left   . LEG SURGERY Left    femur fracture with rod  . REVERSE SHOULDER ARTHROPLASTY Right 11/10/2019   Procedure: RIGHT REVERSE SHOULDER ARTHROPLASTY;  Surgeon: Meredith Pel, MD;  Location: [facility];  Service: Orthopedics;  Laterality: Right;  . TONSILLECTOMY      There were no vitals filed for this visit.  Subjective Assessment - 02/17/20 1458    Currently in Pain?  Yes    Pain Score  2     Pain Location  Shoulder    Pain Orientation  Right    Pain Descriptors / Indicators  Sore;Radiating    Pain Type  Acute  pain    Pain Radiating Towards  around deltoid towards shoulder    Pain Onset  Yesterday    Pain Frequency  Occasional    Aggravating Factors   use and doing more with it. Laying on it.    Pain Relieving Factors  Haven't had the opportunity.    Effect of Pain on Daily Activities  min effect    Multiple Pain Sites  No         [facility] OT Assessment - 02/17/20 1506      Assessment   Medical Diagnosis  Right shoulder S/P reverse total repair      Precautions   Precautions  Shoulder    Type of Shoulder Precautions  P/ROM, A/ROM as tolerated. No lifting over 15# long term. Patient has a history of polo which effected his right side with decreased strength at baseline.                OT Treatments/Exercises (OP) - 02/17/20 1507      Exercises   Exercises  Shoulder      Shoulder Exercises: Seated   Extension  Theraband;12 reps  Theraband Level (Shoulder Extension)  Level 2 (Red)    Retraction  Theraband;12 reps    Theraband Level (Shoulder Retraction)  Level 2 (Red)    Row  Theraband;12 reps    Theraband Level (Shoulder Row)  Level 2 (Red)      Shoulder Exercises: ROM/Strengthening   Other ROM/Strengthening Exercises  PVC pipe slide; 12X      Functional Reaching Activities   Mid Level  Functional reaching task completed while placing 10 cones into middle shelf. Pt removed them as well while focusing on shoulder flexion. Mod-max difficulty. Cones required to be placed specifically for retrieval.                OT Short Term Goals - 02/17/20 1542      OT SHORT TERM GOAL #1   Title  Patient will be educated and independent with HEP in order to increase functional use of RUE during daily tasks and faciliate progress in therapy.    Time  3    Period  Weeks    Target Date  01/28/20      OT SHORT TERM GOAL #2   Title  Patient will increase RUE P/ROM to [facility] in order to increase ability to get shirts on and off with less difficulty.    Time  3    Period  Weeks       OT SHORT TERM GOAL #3   Title  Patient will increase RUE strength to 3+/5 in order to be able to complete lightweight lifting tasks at home.    Time  3    Period  Weeks      OT SHORT TERM GOAL #4   Title  Patient will decrease pain level to 3/10 during functional use activities.    Time  3    Period  Weeks      OT SHORT TERM GOAL #5   Title  Patient will decrease fascial restrictions to a minimal amount in the RUE in order to increase functional mobility needed to complete reaching tasks.    Time  3    Period  Weeks        OT Long Term Goals - 02/15/20 1448      OT LONG TERM GOAL #1   Title  Patient will report increased independence with his RUE while using it for 75% or more of daily tasks.    Time  6    Period  Weeks    Status  On-going      OT LONG TERM GOAL #2   Title  Patient will increase RUE A/ROM to [facility] in order to complete reaching tasks at or above shoulder level.    Time  6    Period  Weeks    Status  On-going      OT LONG TERM GOAL #3   Title  Patient will increase RUE strength to 4/5 in order to return to lifting normal household items with less difficulty.    Time  6    Period  Weeks    Status  On-going      OT LONG TERM GOAL #4   Title  Patient will report a decrease in pain level of approximately 2/10 or less when utilizing his RUE for daily tasks.    Time  6    Period  Weeks    Status  On-going            Plan - 02/17/20 1540    Clinical  Impression Statement  A: Focused on functional reaching tasks to increase ability to complete daily tasks at home using his RUE. Patient demonstrated mod-max difficulty with shelf task. Cones required to be placed at half way position more so due to hand weakness versus shoulder ROM limitations.    Body Structure / Function / Physical Skills  ADL;UE functional use;Fascial restriction;Pain;ROM;Strength    Plan  P: Add UBE bike at start of session.    Consulted and Agree with Plan of Care  Patient        Patient will benefit from skilled therapeutic intervention in order to improve the following deficits and impairments:   Body Structure / Function / Physical Skills: ADL, UE functional use, Fascial restriction, Pain, ROM, Strength       Visit Diagnosis: Stiffness of right shoulder, not elsewhere classified  Other symptoms and signs involving the musculoskeletal system  Acute pain of right shoulder    Problem List Patient Active Problem List   Diagnosis Date Noted  . 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 Cellulitis 02/24/2019  . 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 Osteogenesis imperfecta 12/25/2018  . Diabetes mellitus without complication [facility]) 12/25/2018   Limmie Patricia, OTR/L,CBIS  [phone]  02/17/2020, 3:55 PM  [place] [facility] [address] [place], [place], [address] Phone: [phone]   Fax:  [phone]  Name: Kurt [name] MRN: [id number] Date of Birth: May 26, 1951

## 2020-02-22 ENCOUNTER — Other Ambulatory Visit: Payer: Self-pay

## 2020-02-22 ENCOUNTER — Encounter (HOSPITAL_COMMUNITY): Payer: Self-pay

## 2020-02-22 ENCOUNTER — Ambulatory Visit (HOSPITAL_COMMUNITY): Payer: Medicare Other

## 2020-02-22 DIAGNOSIS — M25611 Stiffness of right shoulder, not elsewhere classified: Secondary | ICD-10-CM | POA: Diagnosis not present

## 2020-02-22 DIAGNOSIS — R29898 Other symptoms and signs involving the musculoskeletal system: Secondary | ICD-10-CM

## 2020-02-22 DIAGNOSIS — M25511 Pain in right shoulder: Secondary | ICD-10-CM

## 2020-02-23 NOTE — Therapy (Signed)
[place], [place], [address] Phone: [phone]   Fax:  [phone]  Occupational Therapy Treatment  Patient Details  Name: Kurt [name] MRN: [id number] Date of Birth: 1951-12-11 Referring Provider (OT): Dr. Marcene Duos   Encounter Date: 02/22/2020  OT End of Session - 02/23/20 1009    Visit Number  11    Number of Visits  12    Date for OT Re-Evaluation  03/17/20    Authorization Type  [employer] medicare    Authorization Time Period  no visit limit. $35 copay.    Progress Note Due on Visit  19    OT Start Time  1645    OT Stop Time  1723    OT Time Calculation (min)  38 min    Activity Tolerance  Patient tolerated treatment well    Behavior During Therapy  WFL for tasks assessed/performed       Past Medical History:  Diagnosis Date  . Arthritis   . Bronchitis   . Bronchitis   . Concussion    late 1990's  after a fall  . Diabetes mellitus without complication ([place])   . GERD (gastroesophageal reflux disease)   . History of kidney stones   . Hypertension   . Osteogenesis imperfecta   . PONV (postoperative nausea and vomiting)    pt has post polio syndrome  . Post-polio syndrome     Past Surgical History:  Procedure Laterality Date  . ANKLE FRACTURE SURGERY Bilateral   . arm surgery Right    nerve surgery  . COLONOSCOPY    . ELBOW FRACTURE SURGERY Left   . EYE MUSCLE SURGERY Left   . LEG SURGERY Left    femur fracture with rod  . REVERSE SHOULDER ARTHROPLASTY Right 11/10/2019   Procedure: RIGHT REVERSE SHOULDER ARTHROPLASTY;  Surgeon: Meredith Pel, MD;  Location: [facility];  Service: Orthopedics;  Laterality: Right;  . TONSILLECTOMY      There were no vitals filed for this visit.  Subjective Assessment - 02/22/20 1655    Subjective   S: I was able to put deodorant on my left arm for the first time today.    Currently in Pain?  Yes    Pain Score  2     Pain Location  Shoulder    Pain  Orientation  Right    Pain Descriptors / Indicators  Aching    Pain Type  Acute pain         [facility] OT Assessment - 02/22/20 1659      Assessment   Medical Diagnosis  Right shoulder S/P reverse total repair      Precautions   Precautions  Shoulder    Type of Shoulder Precautions  P/ROM, A/ROM as tolerated. No lifting over 15# long term. Patient has a history of polo which effected his right side with decreased strength at baseline.                OT Treatments/Exercises (OP) - 02/22/20 1659      Exercises   Exercises  Shoulder      Shoulder Exercises: Seated   Protraction  Strengthening;10 reps    Protraction Weight (lbs)  1/2lb    Horizontal ABduction  AAROM;12 reps    External Rotation  Strengthening;10 reps    External Rotation Weight (lbs)  1    Internal Rotation  Strengthening;10 reps    Internal Rotation Weight (lbs)  1  Flexion  AROM;10 reps      Shoulder Exercises: ROM/Strengthening   UBE (Upper Arm Bike)  Level 1 2' reverse 2' forward    pace: 3.5-4.0   Over Head Lace  seated 2'   unlacing only due to coordination              OT Short Term Goals - 02/17/20 1542      OT SHORT TERM GOAL #1   Title  Patient will be educated and independent with HEP in order to increase functional use of RUE during daily tasks and faciliate progress in therapy.    Time  3    Period  Weeks    Target Date  01/28/20      OT SHORT TERM GOAL #2   Title  Patient will increase RUE P/ROM to [facility] in order to increase ability to get shirts on and off with less difficulty.    Time  3    Period  Weeks      OT SHORT TERM GOAL #3   Title  Patient will increase RUE strength to 3+/5 in order to be able to complete lightweight lifting tasks at home.    Time  3    Period  Weeks      OT SHORT TERM GOAL #4   Title  Patient will decrease pain level to 3/10 during functional use activities.    Time  3    Period  Weeks      OT SHORT TERM GOAL #5   Title  Patient will  decrease fascial restrictions to a minimal amount in the RUE in order to increase functional mobility needed to complete reaching tasks.    Time  3    Period  Weeks        OT Long Term Goals - 02/15/20 1448      OT LONG TERM GOAL #1   Title  Patient will report increased independence with his RUE while using it for 75% or more of daily tasks.    Time  6    Period  Weeks    Status  On-going      OT LONG TERM GOAL #2   Title  Patient will increase RUE A/ROM to [facility] in order to complete reaching tasks at or above shoulder level.    Time  6    Period  Weeks    Status  On-going      OT LONG TERM GOAL #3   Title  Patient will increase RUE strength to 4/5 in order to return to lifting normal household items with less difficulty.    Time  6    Period  Weeks    Status  On-going      OT LONG TERM GOAL #4   Title  Patient will report a decrease in pain level of approximately 2/10 or less when utilizing his RUE for daily tasks.    Time  6    Period  Weeks    Status  On-going            Plan - 02/23/20 1011    Clinical Impression Statement  A: Added UBE bike at start of session and completed portions of shoulder ROM with 1# or 1/2lb weights. Patient required frequent rest breaks due to muscle fatigue although was able to work on proper form and technique with self monitoring.    Body Structure / Function / Physical Skills  ADL;UE functional use;Fascial restriction;Pain;ROM;Strength    Plan  P: UBE bike in the beginning at level 2. Continue with progressing strength and functional reaching.    Consulted and Agree with Plan of Care  Patient       Patient will benefit from skilled therapeutic intervention in order to improve the following deficits and impairments:   Body Structure / Function / Physical Skills: ADL, UE functional use, Fascial restriction, Pain, ROM, Strength       Visit Diagnosis: Stiffness of right shoulder, not elsewhere classified  Other symptoms and signs  involving the musculoskeletal system  Acute pain of right shoulder    Problem List Patient Active Problem List   Diagnosis Date Noted  . 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 Cellulitis 02/24/2019  . 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 Osteogenesis imperfecta 12/25/2018  . Diabetes mellitus without complication [facility]) 12/25/2018   Limmie Patricia, OTR/L,CBIS  [phone]  02/23/2020, 10:13 AM  [place] [facility] [address] [place], [place], [address] Phone: [phone]   Fax:  [phone]  Name: Kurt [name] MRN: [id number] Date of Birth: Aug 09, 1951

## 2020-02-29 ENCOUNTER — Encounter (HOSPITAL_COMMUNITY): Payer: Self-pay

## 2020-02-29 ENCOUNTER — Ambulatory Visit (HOSPITAL_COMMUNITY): Payer: Medicare Other

## 2020-02-29 ENCOUNTER — Other Ambulatory Visit: Payer: Self-pay

## 2020-02-29 DIAGNOSIS — R29898 Other symptoms and signs involving the musculoskeletal system: Secondary | ICD-10-CM

## 2020-02-29 DIAGNOSIS — M25511 Pain in right shoulder: Secondary | ICD-10-CM

## 2020-02-29 DIAGNOSIS — M25611 Stiffness of right shoulder, not elsewhere classified: Secondary | ICD-10-CM | POA: Diagnosis not present

## 2020-02-29 NOTE — Therapy (Signed)
[facility] [address] [place], [place], [address] Phone: [phone]   Fax:  [phone]  Occupational Therapy Treatment  Patient Details  Name: Kurt [name] MRN: [id number] Date of Birth: December 07, 1951 Referring Provider (OT): Dr. Rise Paganini   Encounter Date: 02/29/2020  OT End of Session - 02/29/20 1747    Visit Number  12    Number of Visits  14    Date for OT Re-Evaluation  03/17/20    Authorization Type  [employer] medicare    Authorization Time Period  no visit limit. $35 copay.    Progress Note Due on Visit  19    OT Start Time  1647    OT Stop Time  1730    OT Time Calculation (min)  43 min    Activity Tolerance  Patient tolerated treatment well    Behavior During Therapy  WFL for tasks assessed/performed       Past Medical History:  Diagnosis Date  . Arthritis   . Bronchitis   . Bronchitis   . Concussion    late 1990's  after a fall  . Diabetes mellitus without complication ([facility])   . GERD (gastroesophageal reflux disease)   . History of kidney stones   . Hypertension   . Osteogenesis imperfecta   . PONV (postoperative nausea and vomiting)    pt has post polio syndrome  . Post-polio syndrome     Past Surgical History:  Procedure Laterality Date  . ANKLE FRACTURE SURGERY Bilateral   . arm surgery Right    nerve surgery  . COLONOSCOPY    . ELBOW FRACTURE SURGERY Left   . EYE MUSCLE SURGERY Left   . LEG SURGERY Left    femur fracture with rod  . REVERSE SHOULDER ARTHROPLASTY Right 11/10/2019   Procedure: RIGHT REVERSE SHOULDER ARTHROPLASTY;  Surgeon: Cammy Copa, MD;  Location: [facility] OR;  Service: Orthopedics;  Laterality: Right;  . TONSILLECTOMY      There were no vitals filed for this visit.  Subjective Assessment - 02/29/20 1700    Subjective   S: I'm able to fold the laundry with both arms now.    Currently in Pain?  Yes    Pain Score  2     Pain Location  Shoulder    Pain Orientation  Right     Pain Descriptors / Indicators  Aching    Pain Type  Acute pain    Pain Radiating Towards  around deltoid towards shoulder    Pain Onset  In the past 7 days    Pain Frequency  Occasional    Aggravating Factors   use and doing more with it. laying on it.    Pain Relieving Factors  Haven't had the opportunity.    Effect of Pain on Daily Activities  min effect         [facility] OT Assessment - 02/29/20 1701      Assessment   Medical Diagnosis  Right shoulder S/P reverse total repair      Precautions   Precautions  Shoulder    Type of Shoulder Precautions  P/ROM, A/ROM as tolerated. No lifting over 15# long term. Patient has a history of polo which effected his right side with decreased strength at baseline.                OT Treatments/Exercises (OP) - 02/29/20 1704      Exercises   Exercises  Shoulder  Shoulder Exercises: Seated   Protraction  Strengthening;12 reps    Protraction Weight (lbs)  1/2lb    Horizontal ABduction  AAROM;12 reps    External Rotation  Strengthening;12 reps    External Rotation Weight (lbs)  1    Internal Rotation  Strengthening;12 reps    Internal Rotation Weight (lbs)  1    Flexion  AAROM;12 reps    Abduction  AROM;10 reps      Shoulder Exercises: ROM/Strengthening   UBE (Upper Arm Bike)  level 2 2' forward 2' reverse    pace: 4.5-5.5   Over Head Lace  seated 4\' 20"  (3/4 of the rope completed)   unlace only due to decreased coordination              OT Short Term Goals - 02/17/20 1542      OT SHORT TERM GOAL #1   Title  Patient will be educated and independent with HEP in order to increase functional use of RUE during daily tasks and faciliate progress in therapy.    Time  3    Period  Weeks    Target Date  01/28/20      OT SHORT TERM GOAL #2   Title  Patient will increase RUE P/ROM to [facility] in order to increase ability to get shirts on and off with less difficulty.    Time  3    Period  Weeks      OT SHORT TERM GOAL  #3   Title  Patient will increase RUE strength to 3+/5 in order to be able to complete lightweight lifting tasks at home.    Time  3    Period  Weeks      OT SHORT TERM GOAL #4   Title  Patient will decrease pain level to 3/10 during functional use activities.    Time  3    Period  Weeks      OT SHORT TERM GOAL #5   Title  Patient will decrease fascial restrictions to a minimal amount in the RUE in order to increase functional mobility needed to complete reaching tasks.    Time  3    Period  Weeks        OT Long Term Goals - 02/15/20 1448      OT LONG TERM GOAL #1   Title  Patient will report increased independence with his RUE while using it for 75% or more of daily tasks.    Time  6    Period  Weeks    Status  On-going      OT LONG TERM GOAL #2   Title  Patient will increase RUE A/ROM to [facility] in order to complete reaching tasks at or above shoulder level.    Time  6    Period  Weeks    Status  On-going      OT LONG TERM GOAL #3   Title  Patient will increase RUE strength to 4/5 in order to return to lifting normal household items with less difficulty.    Time  6    Period  Weeks    Status  On-going      OT LONG TERM GOAL #4   Title  Patient will report a decrease in pain level of approximately 2/10 or less when utilizing his RUE for daily tasks.    Time  6    Period  Weeks    Status  On-going  Plan - 02/29/20 1747    Clinical Impression Statement  A: Increased resistance with UBE bike to focus on shoulder endurance. Patient had more difficulty with strengthening exercises and required modifications such as decreasing weight or using a pvc pipe to assist. Increased muscle cramping experienced during portion of exercises.    Body Structure / Function / Physical Skills  ADL;UE functional use;Fascial restriction;Pain;ROM;Strength    Plan  P: UBE bike at beginning of session. Focus on skills for the following: Opening the fridge door with right arm, and  using the right arm to wipe/clean the sink.    Consulted and Agree with Plan of Care  Patient       Patient will benefit from skilled therapeutic intervention in order to improve the following deficits and impairments:   Body Structure / Function / Physical Skills: ADL, UE functional use, Fascial restriction, Pain, ROM, Strength       Visit Diagnosis: Other symptoms and signs involving the musculoskeletal system  Stiffness of right shoulder, not elsewhere classified  Acute pain of right shoulder    Problem List Patient Active Problem List   Diagnosis Date Noted  . 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 Cellulitis 02/24/2019  . 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 Osteogenesis imperfecta 12/25/2018  . Diabetes mellitus without complication [facility]) 34/74/2595   Ailene Ravel, OTR/L,CBIS  [phone]  02/29/2020, 5:49 PM  [place] [address] [place], [place], [address] Phone: [phone]   Fax:  [phone]  Name: Kurt [name] MRN: [id number] Date of Birth: March 25, 1951

## 2020-02-29 NOTE — Progress Notes (Signed)
[facility] - Clinic Note  03/01/2020     CHIEF COMPLAINT Patient presents for Retina Follow Up   HISTORY OF PRESENT ILLNESS: Kurt [name] is a 69 y.o. male who presents to the clinic today for:   HPI    Retina Follow Up    Patient presents with  CRVO/BRVO.  In right eye.  Severity is moderate.  Duration of 4 weeks.  Since onset it is stable.  I, the attending physician,  performed the HPI with the patient and updated documentation appropriately.          Comments    Patient states vision the same OU. BS was 97 around 11:30 am today. Last a1c was 6.6 in November 2020.       Last edited by Rennis Chris, MD on 03/01/2020  1:24 PM. (History)    Patient states he feels like his vision is getting better, he states he does not have the "white spot" in his vision anymore  Referring physician: Aura Camps, MD [address] [place],  [place] [address]  HISTORICAL INFORMATION:   Selected notes from the MEDICAL RECORD NUMBER Diabetic Eval per Dr. Karleen Hampshire   CURRENT MEDICATIONS: Current Outpatient Medications (Ophthalmic Drugs)  Medication Sig  . Olopatadine HCl 0.7 % SOLN Apply to eye.   No current facility-administered medications for this visit. (Ophthalmic Drugs)   Current Outpatient Medications (Other)  Medication Sig  . acetaminophen (TYLENOL) 650 MG CR tablet Take 1,300 mg by mouth every 8 (eight) hours as needed for pain.  Marland Kitchen aspirin 81 MG chewable tablet Chew 1 tablet (81 mg total) by mouth daily.  Marland Kitchen glipiZIDE (GLUCOTROL XL) 10 MG 24 hr tablet Take 10 mg by mouth at bedtime.   Marland Kitchen LANTUS SOLOSTAR 100 UNIT/ML Solostar Pen Inject 33 Units into the skin at bedtime.   Marland Kitchen lisinopril (PRINIVIL,ZESTRIL) 20 MG tablet Take 20 mg by mouth daily.  . methocarbamol (ROBAXIN) 500 MG tablet Take 500 mg by mouth at bedtime.   Marland Kitchen oxyCODONE (OXY IR/ROXICODONE) 5 MG immediate release tablet Take 1 tablet (5 mg total) by mouth every 8 (eight) hours as  needed for moderate pain (pain score 4-6).  Marland Kitchen simvastatin (ZOCOR) 20 MG tablet Take 20 mg by mouth every evening.  . SitaGLIPtin-MetFORMIN HCl (JANUMET XR) [phone] MG TB24 Take 1 tablet by mouth at bedtime.   No current facility-administered medications for this visit. (Other)      REVIEW OF SYSTEMS: ROS    Positive for: Gastrointestinal, Musculoskeletal, Endocrine, Eyes   Negative for: Constitutional, Neurological, Skin, Genitourinary, HENT, Cardiovascular, Respiratory, Psychiatric, Allergic/Imm, Heme/Lymph   Last edited by Annalee Genta D, COT on 03/01/2020  1:01 PM. (History)       ALLERGIES Allergies  Allergen Reactions  . Augmentin [Amoxicillin-Pot Clavulanate] Nausea And Vomiting  . Iodine Hives  . Tape Rash    Paper     PAST MEDICAL HISTORY Past Medical History:  Diagnosis Date  . Arthritis   . Bronchitis   . Bronchitis   . Concussion    late 1990's  after a fall  . Diabetes mellitus without complication (HCC)   . GERD (gastroesophageal reflux disease)   . History of kidney stones   . Hypertension   . Osteogenesis imperfecta   . PONV (postoperative nausea and vomiting)    pt has post polio syndrome  . Post-polio syndrome    Past Surgical History:  Procedure Laterality Date  . ANKLE FRACTURE SURGERY Bilateral   .  arm surgery Right    nerve surgery  . COLONOSCOPY    . ELBOW FRACTURE SURGERY Left   . EYE MUSCLE SURGERY Left   . LEG SURGERY Left    femur fracture with rod  . REVERSE SHOULDER ARTHROPLASTY Right 11/10/2019   Procedure: RIGHT REVERSE SHOULDER ARTHROPLASTY;  Surgeon: Cammy Copa, MD;  Location: [facility] OR;  Service: Orthopedics;  Laterality: Right;  . TONSILLECTOMY      FAMILY HISTORY Family History  Problem Relation Age of Onset  . Hypertension Mother   . Diabetes Brother     SOCIAL HISTORY Social History   Tobacco Use  . Smoking status: Never Smoker  . Smokeless tobacco: Never Used  Substance Use Topics  . Alcohol use: Yes     Comment: 1 beer occasionally  . Drug use: No         OPHTHALMIC EXAM:  Base Eye Exam    Visual Acuity (Snellen - Linear)      Right Left   Dist cc 20/50 -2 20/40 +2   Dist ph cc 20/30 -2 20/25 +2   Correction: Glasses       Tonometry (Tonopen, 1:10 PM)      Right Left   Pressure 12 15       Pupils      Dark Light Shape React APD   Right 4 3 Round Brisk None   Left 4 3 Round Brisk None       Visual Fields (Counting fingers)      Left Right    Full Full       Extraocular Movement      Right Left    Full, Ortho Full, Ortho       Neuro/Psych    Oriented x3: Yes   Mood/Affect: Normal       Dilation    Both eyes: 1.0% Mydriacyl, 2.5% Phenylephrine @ 1:10 PM        Slit Lamp and Fundus Exam    Slit Lamp Exam      Right Left   Lids/Lashes Dermatochalasis - upper lid, Dermatochalasis - lower lid, mild Meibomian gland dysfunction Dermatochalasis - upper lid, Dermatochalasis - lower lid, mild Meibomian gland dysfunction   Conjunctiva/Sclera blue sclera blue sclera   Cornea arcus arcus   Anterior Chamber deep and clear deep and clear   Iris round and dilated, no NVI round and dilated, no NVI   Lens 2+ NS, 2+CS 2+ NS, 2+CS   Vitreous mild syneresis, PVD mild syneresis       Fundus Exam      Right Left   Disc pink and sharp pink and sharp   C/D Ratio 0.3 0.3   Macula blunted foveal reflex, focal edema -- increased, IRH, MA, and CWS superonasal macula -- improved flat, good foveal reflex, mild RPE mottling and clumping, +drusen, No heme or edema   Vessels mild attenuation, +A/V crossing changes, focal BRVO superior macula, mild Tortuousity mild attenuation and tortuosity   Periphery attached, no heme attached, no heme        Refraction    Wearing Rx      Sphere Cylinder Axis Add   Right -2.00 +2.25 111 +2.75   Left -1.50 +1.50 094 2.75          IMAGING AND PROCEDURES  Imaging and Procedures for @TODAY @  OCT, Retina - OU - Both Eyes        Right Eye Quality was good. Central Foveal Thickness: 299. Progression has  worsened. Findings include no SRF, intraretinal fluid, retinal drusen , normal foveal contour (Mild Interval increase in IRF/edema SN macula).   Left Eye Quality was good. Central Foveal Thickness: 256. Progression has been stable. Findings include normal foveal contour, no IRF, no SRF, vitreomacular adhesion , retinal drusen .   Notes *Images captured and stored on drive  Diagnosis / Impression:  OD: BRVO with focal CME superonasal macula -- Interval increase in IRF/edema SN macula OS: NFP, no SRF/IRF   Clinical management:  See below  Abbreviations: NFP - Normal foveal profile. CME - cystoid macular edema. PED - pigment epithelial detachment. IRF - intraretinal fluid. SRF - subretinal fluid. EZ - ellipsoid zone. ERM - epiretinal membrane. ORA - outer retinal atrophy. ORT - outer retinal tubulation. SRHM - subretinal hyper-reflective material         Intravitreal Injection, Pharmacologic Agent - OD - Right Eye       Time Out 03/01/2020. 1:19 PM. Confirmed correct patient, procedure, site, and patient consented.   Anesthesia Topical anesthesia was used. Anesthetic medications included Lidocaine 2%, Proparacaine 0.5%.   Procedure Preparation included 5% betadine to ocular surface, eyelid speculum. A supplied needle was used.   Injection:  1.25 mg Bevacizumab (AVASTIN) SOLN   NDC: 19622-297-98, Lot: [id number] , Expiration date: 04/12/2020   Route: Intravitreal, Site: Right Eye, Waste: 0 mL  Post-op Post injection exam found visual acuity of at least counting fingers. The patient tolerated the procedure well. There were no complications. The patient received written and verbal post procedure care education.                 ASSESSMENT/PLAN:    ICD-10-CM   1. Branch retinal vein occlusion of right eye with macular edema  H34.8310 Intravitreal Injection, Pharmacologic Agent - OD - Right Eye     Bevacizumab (AVASTIN) SOLN 1.25 mg  2. Retinal edema  H35.81 OCT, Retina - OU - Both Eyes  3. Diabetes mellitus type 2 without retinopathy ([place])  E11.9   4. Essential hypertension  I10   5. Hypertensive retinopathy of both eyes  H35.033   6. Combined forms of age-related cataract of both eyes  H25.813     1,2. BRVO with CME OD  - s/p IVA OD #1 (01.19.21), #2 (02.16.21)  - BCVA 20/30, today -- stable  - exam with focal edema, IRH, CWS superonasal macula  - OCT shows interval increase in IRF/edema SN macula  - recommend IVA OD #3 today, 03.16.21  - RBA of procedure discussed, questions answered  - informed consent obtained and signed  - see procedure note  - F/U 4 weeks -- DFE/OCT/possible injection  3. Diabetes mellitus, type 2 without retinopathy OU  - The incidence, risk factors for progression, natural history and treatment options for diabetic retinopathy  were discussed with patient.    - The need for close monitoring of blood glucose, blood pressure, and serum lipids, avoiding cigarette or any type of tobacco, and the need for long term follow up was also discussed with patient.  - f/u in 1 year, sooner prn  4,5. Hypertensive retinopathy OU  - discussed importance of tight BP control  - monitor  6. Age related cataracts OU   - The symptoms of cataract, surgical options, and treatments and risks were discussed with patient.  - discussed diagnosis and progression  - not yet visually significant  - monitor for now   Ophthalmic Meds Ordered this visit:  Meds ordered this encounter  Medications  .  Bevacizumab (AVASTIN) SOLN 1.25 mg       Return in about 4 weeks (around 03/29/2020) for f/u BRVO with CME OD, DFE, OCT.  There are no Patient Instructions on file for this visit.   Explained the diagnoses, plan, and follow up with the patient and they expressed understanding.  Patient expressed understanding of the importance of proper follow up care.   This document  serves as a record of services personally performed by Karie Chimera, MD, PhD. It was created on their behalf by Cristopher Estimable, COT an ophthalmic technician. The creation of this record is the provider's dictation and/or activities during the visit.    Electronically signed by: Cristopher Estimable, COT 02/29/20 @ 1:45 PM   This document serves as a record of services personally performed by Karie Chimera, MD, PhD. It was created on their behalf by Laurian Brim, OA, an ophthalmic assistant. The creation of this record is the provider's dictation and/or activities during the visit.    Electronically signed by: Laurian Brim, OA 03.16.2021 1:45 PM  Karie Chimera, M.D., Ph.D. Diseases & Surgery of the Retina and Vitreous [facility] 03/01/2020   I have reviewed the above documentation for accuracy and completeness, and I agree with the above. Karie Chimera, M.D., Ph.D. 03/01/20 1:45 PM   Abbreviations: M myopia (nearsighted); A astigmatism; H hyperopia (farsighted); P presbyopia; Mrx spectacle prescription;  CTL contact lenses; OD right eye; OS left eye; OU both eyes  XT exotropia; ET esotropia; PEK punctate epithelial keratitis; PEE punctate epithelial erosions; DES dry eye syndrome; MGD meibomian gland dysfunction; ATs artificial tears; PFAT's preservative free artificial tears; NSC nuclear sclerotic cataract; PSC posterior subcapsular cataract; ERM epi-retinal membrane; PVD posterior vitreous detachment; RD retinal detachment; DM diabetes mellitus; DR diabetic retinopathy; NPDR non-proliferative diabetic retinopathy; PDR proliferative diabetic retinopathy; CSME clinically significant macular edema; DME diabetic macular edema; dbh dot blot hemorrhages; CWS cotton wool spot; POAG primary open angle glaucoma; C/D cup-to-disc ratio; HVF humphrey visual field; GVF goldmann visual field; OCT optical coherence tomography; IOP intraocular pressure; BRVO Branch retinal vein occlusion;  CRVO central retinal vein occlusion; CRAO central retinal artery occlusion; BRAO branch retinal artery occlusion; RT retinal tear; SB scleral buckle; PPV pars plana vitrectomy; VH Vitreous hemorrhage; PRP panretinal laser photocoagulation; IVK intravitreal kenalog; VMT vitreomacular traction; MH Macular hole;  NVD neovascularization of the disc; NVE neovascularization elsewhere; AREDS age related eye disease study; ARMD age related macular degeneration; POAG primary open angle glaucoma; EBMD epithelial/anterior basement membrane dystrophy; ACIOL anterior chamber intraocular lens; IOL intraocular lens; PCIOL posterior chamber intraocular lens; Phaco/IOL phacoemulsification with intraocular lens placement; PRK photorefractive keratectomy; LASIK laser assisted in situ keratomileusis; HTN hypertension; DM diabetes mellitus; COPD chronic obstructive pulmonary disease

## 2020-03-01 ENCOUNTER — Ambulatory Visit (INDEPENDENT_AMBULATORY_CARE_PROVIDER_SITE_OTHER): Payer: Medicare Other | Admitting: Ophthalmology

## 2020-03-01 ENCOUNTER — Encounter (INDEPENDENT_AMBULATORY_CARE_PROVIDER_SITE_OTHER): Payer: Self-pay | Admitting: Ophthalmology

## 2020-03-01 DIAGNOSIS — H3581 Retinal edema: Secondary | ICD-10-CM

## 2020-03-01 DIAGNOSIS — H35033 Hypertensive retinopathy, bilateral: Secondary | ICD-10-CM

## 2020-03-01 DIAGNOSIS — H25813 Combined forms of age-related cataract, bilateral: Secondary | ICD-10-CM

## 2020-03-01 DIAGNOSIS — I10 Essential (primary) hypertension: Secondary | ICD-10-CM

## 2020-03-01 DIAGNOSIS — E119 Type 2 diabetes mellitus without complications: Secondary | ICD-10-CM | POA: Diagnosis not present

## 2020-03-01 DIAGNOSIS — H34831 Tributary (branch) retinal vein occlusion, right eye, with macular edema: Secondary | ICD-10-CM

## 2020-03-01 MED ORDER — BEVACIZUMAB CHEMO INJECTION 1.25MG/0.05ML SYRINGE FOR KALEIDOSCOPE
1.2500 mg | INTRAVITREAL | Status: AC | PRN
  Administered 2020-03-01: 1.25 mg via INTRAVITREAL

## 2020-03-05 IMAGING — CT CT OF THE RIGHT SHOULDER WITHOUT CONTRAST
1 of 2 series · 9 of 14 positions shown, 12 images · non-contrast
Comparison: MRI dated 07/01/2019 and radiographs dated 12/25/2018

CLINICAL DATA: Chronic right shoulder pain and decreased range of
motion. Osteoarthritis of the glenohumeral joint. Pre-surgical
evaluation.

EXAM:
CT OF THE UPPER RIGHT EXTREMITY WITHOUT CONTRAST
TECHNIQUE: Multidetector CT imaging of the upper right extremity was performed
according to the standard protocol.

[Series 3: soft thin · axial · 0.47mm/px · z∈[-194,-43]mm · 9 of 316 slices shown, 12 images]
[im 32/316  soft-tissue]
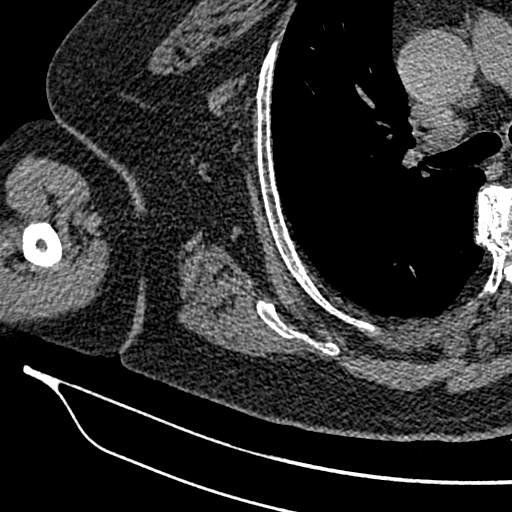
[im 32/316  bone]
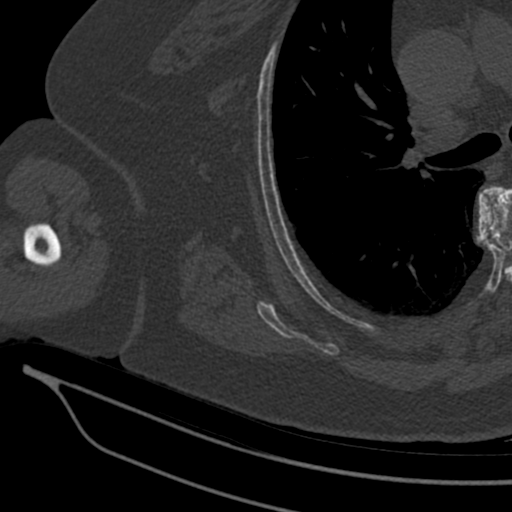
[im 64/316  bone]
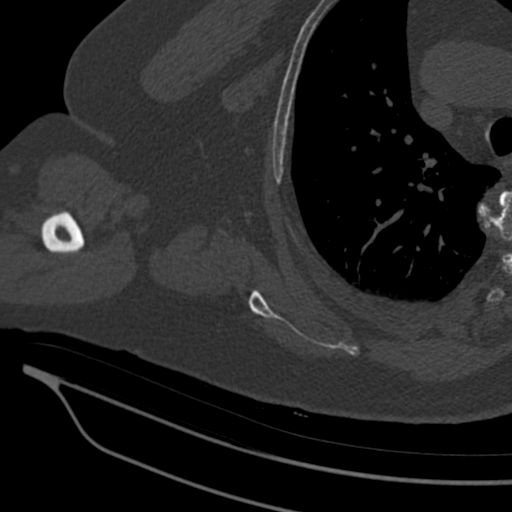
[im 95/316  bone]
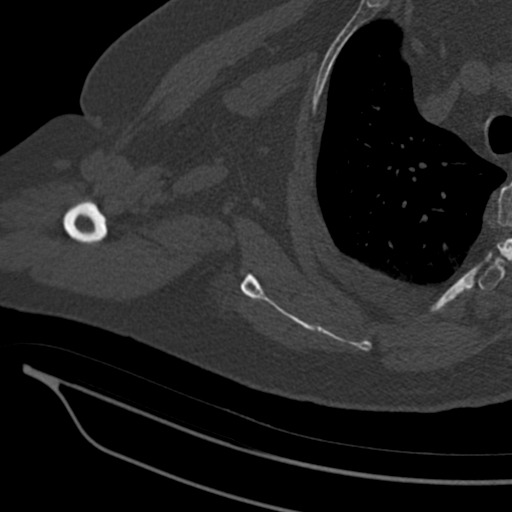
[im 127/316  bone]
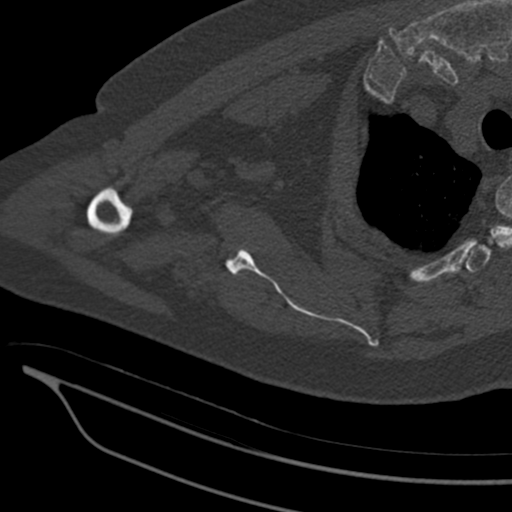
[im 158/316  soft-tissue]
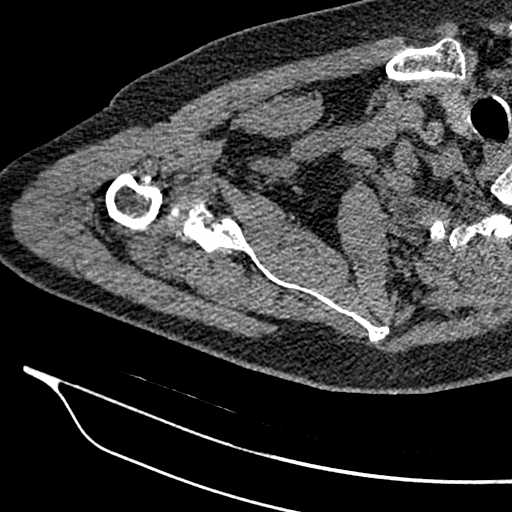
[im 158/316  bone]
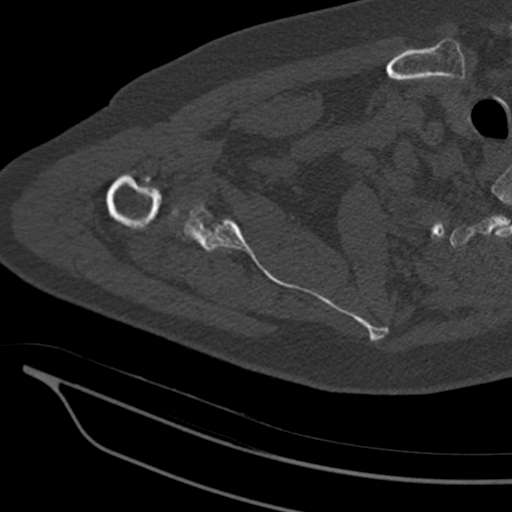
[im 190/316  bone]
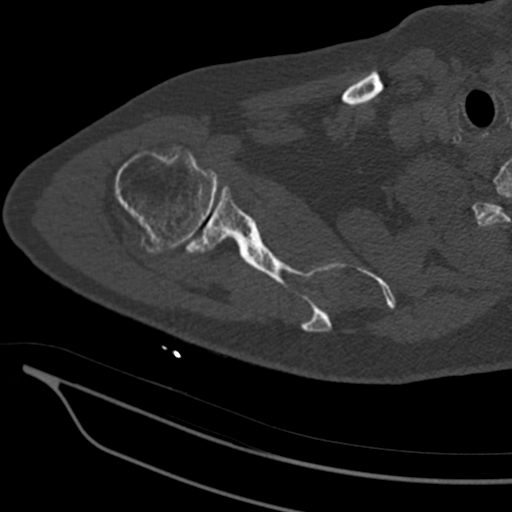
[im 221/316  bone]
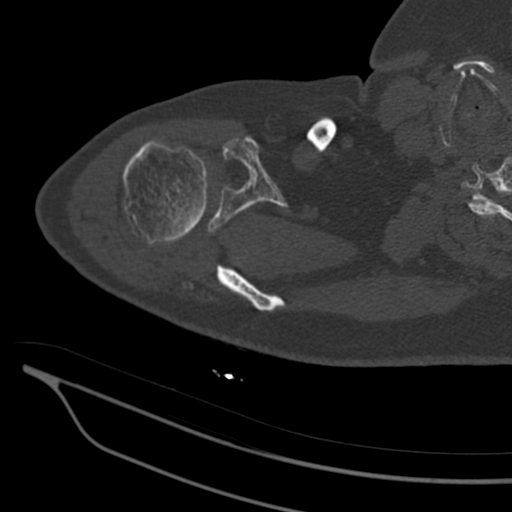
[im 253/316  bone]
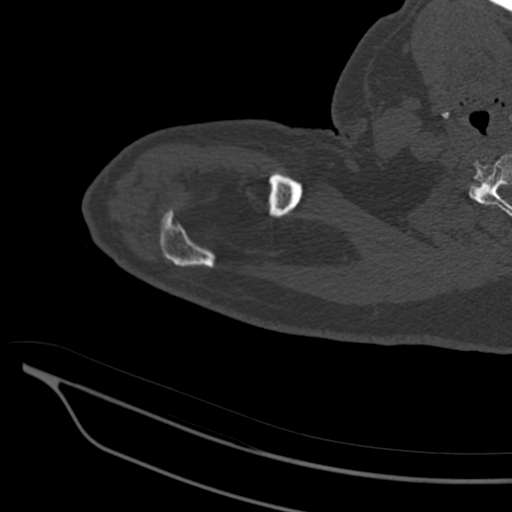
[im 284/316  soft-tissue]
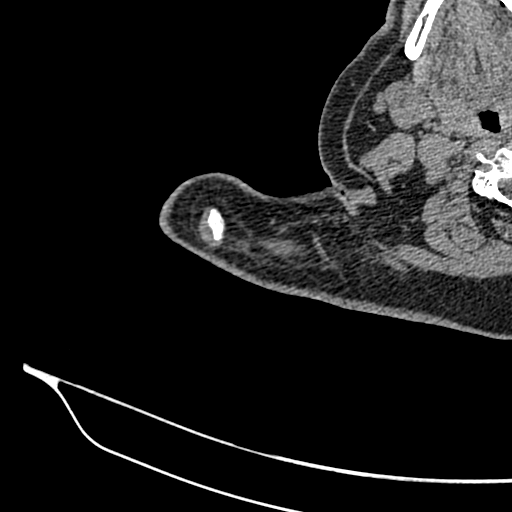
[im 284/316  bone]
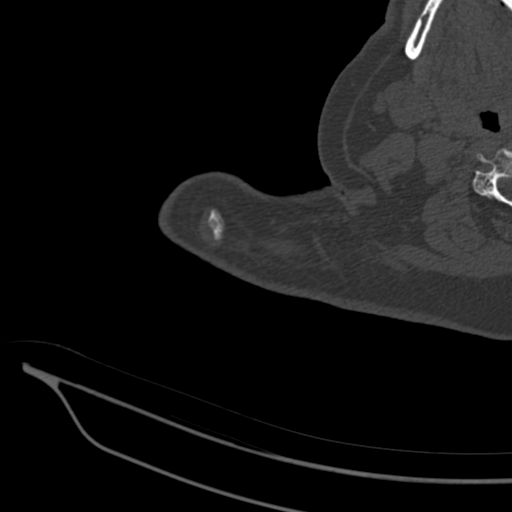

[9 of 14 positions shown; findings below may reference images not displayed]

FINDINGS: Bones/Joint/Cartilage

There is severe arthritis of the glenohumeral joint with extensive
full-thickness cartilage loss with marginal osteophyte formation on
the glenoid and humeral head. Slight AC joint arthropathy. Type 1
acromion. Small calcified loose body in the bicipital tendon sheath.

Muscles and Tendons

No atrophy of the muscles of the rotator cuff.

Soft tissues

Normal.
IMPRESSION: Severe osteoarthritis of the glenohumeral joint.

## 2020-03-07 ENCOUNTER — Ambulatory Visit (HOSPITAL_COMMUNITY): Payer: Medicare Other

## 2020-03-07 ENCOUNTER — Encounter (HOSPITAL_COMMUNITY): Payer: Self-pay

## 2020-03-07 ENCOUNTER — Other Ambulatory Visit: Payer: Self-pay

## 2020-03-07 DIAGNOSIS — M25611 Stiffness of right shoulder, not elsewhere classified: Secondary | ICD-10-CM | POA: Diagnosis not present

## 2020-03-07 DIAGNOSIS — M25511 Pain in right shoulder: Secondary | ICD-10-CM

## 2020-03-07 DIAGNOSIS — R29898 Other symptoms and signs involving the musculoskeletal system: Secondary | ICD-10-CM

## 2020-03-07 NOTE — Therapy (Signed)
[place], [place], [address] Phone: [phone]   Fax:  [phone]  Occupational Therapy Treatment  Patient Details  Name: Kurt [name] MRN: [id number] Date of Birth: Jul 31, 1951 Referring Provider (OT): Dr. Marcene Duos   Encounter Date: 03/07/2020  OT End of Session - 03/07/20 1742    Visit Number  13    Number of Visits  14    Date for OT Re-Evaluation  03/17/20    Authorization Type  [employer] medicare    Authorization Time Period  no visit limit. $35 copay.    Progress Note Due on Visit  19    OT Start Time  1520   Pt in bathroom before session   OT Stop Time  1553    OT Time Calculation (min)  33 min    Activity Tolerance  Patient tolerated treatment well    Behavior During Therapy  WFL for tasks assessed/performed       Past Medical History:  Diagnosis Date  . Arthritis   . Bronchitis   . Bronchitis   . Concussion    late 1990's  after a fall  . Diabetes mellitus without complication ([place])   . GERD (gastroesophageal reflux disease)   . History of kidney stones   . Hypertension   . Osteogenesis imperfecta   . PONV (postoperative nausea and vomiting)    pt has post polio syndrome  . Post-polio syndrome     Past Surgical History:  Procedure Laterality Date  . ANKLE FRACTURE SURGERY Bilateral   . arm surgery Right    nerve surgery  . COLONOSCOPY    . ELBOW FRACTURE SURGERY Left   . EYE MUSCLE SURGERY Left   . LEG SURGERY Left    femur fracture with rod  . REVERSE SHOULDER ARTHROPLASTY Right 11/10/2019   Procedure: RIGHT REVERSE SHOULDER ARTHROPLASTY;  Surgeon: Meredith Pel, MD;  Location: [facility];  Service: Orthopedics;  Laterality: Right;  . TONSILLECTOMY      There were no vitals filed for this visit.  Subjective Assessment - 03/07/20 1522    Subjective   S: Once I get up and move it does ok.    Currently in Pain?  Yes    Pain Score  4     Pain Location  Shoulder    Pain  Orientation  Right    Pain Descriptors / Indicators  Sore    Pain Type  Acute pain    Pain Radiating Towards  N/A    Pain Onset  Today    Pain Frequency  Occasional    Aggravating Factors   Stretches    Pain Relieving Factors  Not bad enough to.    Effect of Pain on Daily Activities  Min effect    Multiple Pain Sites  No         [facility] OT Assessment - 03/07/20 1523      Assessment   Medical Diagnosis  Right shoulder S/P reverse total repair      Precautions   Precautions  Shoulder    Type of Shoulder Precautions  P/ROM, A/ROM as tolerated. No lifting over 15# long term. Patient has a history of polo which effected his right side with decreased strength at baseline.                OT Treatments/Exercises (OP) - 03/07/20 1523      ADLs   ADL Comments  See Education for instructions: Focused  on body mechanics and technique to open a fridge door.       Exercises   Exercises  Shoulder      Shoulder Exercises: Standing   Other Standing Exercises  At table: 1# wrist weiight 1' for each movement: proximal shoulder strengthening both directions, side to side, V arm. Completed with 2lb wrist weights also.       Shoulder Exercises: ROM/Strengthening   UBE (Upper Arm Bike)  level 2 2' forward 2' reverse    pace: 4.5-5.5   Other ROM/Strengthening Exercises  [employer] Pulleys: 40# used with staggered stance and elbow adducted with 90 degrees elbow flexion. Focused on weight shift forward and back to open fridge door.              OT Education - 03/07/20 1541    Education Details  Safety Tips for opening a fridge door handout.    Person(s) Educated  Patient    Methods  Explanation;Demonstration;Verbal cues;Handout    Comprehension  Returned demonstration;Verbalized understanding;Need further instruction       OT Short Term Goals - 02/17/20 1542      OT SHORT TERM GOAL #1   Title  Patient will be educated and independent with HEP in order to increase functional use  of RUE during daily tasks and faciliate progress in therapy.    Time  3    Period  Weeks    Target Date  01/28/20      OT SHORT TERM GOAL #2   Title  Patient will increase RUE P/ROM to [facility] in order to increase ability to get shirts on and off with less difficulty.    Time  3    Period  Weeks      OT SHORT TERM GOAL #3   Title  Patient will increase RUE strength to 3+/5 in order to be able to complete lightweight lifting tasks at home.    Time  3    Period  Weeks      OT SHORT TERM GOAL #4   Title  Patient will decrease pain level to 3/10 during functional use activities.    Time  3    Period  Weeks      OT SHORT TERM GOAL #5   Title  Patient will decrease fascial restrictions to a minimal amount in the RUE in order to increase functional mobility needed to complete reaching tasks.    Time  3    Period  Weeks        OT Long Term Goals - 02/15/20 1448      OT LONG TERM GOAL #1   Title  Patient will report increased independence with his RUE while using it for 75% or more of daily tasks.    Time  6    Period  Weeks    Status  On-going      OT LONG TERM GOAL #2   Title  Patient will increase RUE A/ROM to [facility] in order to complete reaching tasks at or above shoulder level.    Time  6    Period  Weeks    Status  On-going      OT LONG TERM GOAL #3   Title  Patient will increase RUE strength to 4/5 in order to return to lifting normal household items with less difficulty.    Time  6    Period  Weeks    Status  On-going      OT LONG TERM GOAL #4  Title  Patient will report a decrease in pain level of approximately 2/10 or less when utilizing his RUE for daily tasks.    Time  6    Period  Weeks    Status  On-going            Plan - 03/07/20 1743    Clinical Impression Statement  A: Session focused on the skills needed to open the fridge door at home more efficiently and with greater independence using the right arm while providing a handout for technique. Patient  with limited mobility and narrow base of support due to comorbidities cause him to be at a higher risk of falling as well as increased difficulty with proper standing and body posture. Required hands on tactile cueing to complete.    Body Structure / Function / Physical Skills  ADL;UE functional use;Fascial restriction;Pain;ROM;Strength    Plan  P: Reassess and discharge. Review HEP and adjust as needed.    Consulted and Agree with Plan of Care  Patient       Patient will benefit from skilled therapeutic intervention in order to improve the following deficits and impairments:   Body Structure / Function / Physical Skills: ADL, UE functional use, Fascial restriction, Pain, ROM, Strength       Visit Diagnosis: Acute pain of right shoulder  Stiffness of right shoulder, not elsewhere classified  Other symptoms and signs involving the musculoskeletal system    Problem List Patient Active Problem List   Diagnosis Date Noted  . 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 Cellulitis 02/24/2019  . 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 Osteogenesis imperfecta 12/25/2018  . Diabetes mellitus without complication [facility]) 12/25/2018   Limmie Patricia, OTR/L,CBIS  [phone]  03/07/2020, 5:50 PM  [facility] [address] [place], [place], [address] Phone: [phone]   Fax:  [phone]  Name: Kurt [name] MRN: [id number] Date of Birth: 03/23/1951

## 2020-03-07 NOTE — Patient Instructions (Signed)
TIPS TO HELP OPEN THE FRIDGE DOOR SAFELY Here are several opening fridge safety tips you should follow to avoid an injury or fracture. Marland Kitchen Apply a bit of vegetable oil along the rubber seal to reduce the force of the magnet that holds the fridge door closed. . As you stand ready to open the fridge door, get into a staggered stance (like a boxer). Do not have your feet parallel or side-by-side. . Place the front foot about twelve inches to the outside of the fridge. . Your back foot is best aligned in line with the handle. Marland Kitchen Keep the elbow of the arm that is pulling at a 90-degree angle. . As you engage, shift weight forward to the front foot. . As you pull, shift your weight onto the back foot. . Remember to take in a breath before you start and exhale as you pull the door open. . To protect your neck, keep your tongue relaxed and placed at the roof of your mouth.

## 2020-03-14 ENCOUNTER — Other Ambulatory Visit: Payer: Self-pay

## 2020-03-14 ENCOUNTER — Encounter (INDEPENDENT_AMBULATORY_CARE_PROVIDER_SITE_OTHER): Payer: Self-pay

## 2020-03-14 ENCOUNTER — Ambulatory Visit (HOSPITAL_COMMUNITY): Payer: Medicare Other

## 2020-03-14 DIAGNOSIS — M25611 Stiffness of right shoulder, not elsewhere classified: Secondary | ICD-10-CM | POA: Diagnosis not present

## 2020-03-14 DIAGNOSIS — M25511 Pain in right shoulder: Secondary | ICD-10-CM

## 2020-03-14 DIAGNOSIS — R29898 Other symptoms and signs involving the musculoskeletal system: Secondary | ICD-10-CM

## 2020-03-14 NOTE — Patient Instructions (Addendum)
Repeat all exercises 10-15 times, 1-2 times per day.  1) Shoulder Protraction    Begin with elbows by your side, slowly "punch" straight out in front of you.      2) Shoulder Flexion *USE 1LB WEIGHT OR HOLD WATER BOTTLE IF ABLE FOR SOME RESISTANCE.*  Standing:  _       Begin with arms at your side with thumbs pointed up, slowly raise both arms up and forward towards overhead.     3) Horizontal abduction/adduction * USE YOUR ARM WEIGHT.*    Standing:           Begin with arms straight out in front of you, bring out to the side in at "T" shape. Keep arms straight entire time.        4) Internal & External Rotation *USE 1LB WEIGHT OR HOLD WATER BOTTLE IF ABLE FOR SOME RESISTANCE.*  Standing:     Stand with elbows at the side and elbows bent 90 degrees. Move your forearms away from your body, then bring back inward toward the body.     5) Shoulder Abduction *USE 1LB WEIGHT OR HOLD WATER BOTTLE IF ABLE FOR SOME RESISTANCE.*  Standing:       Begin with your arms flat next to your side. Slowly move your arms out to the side so that they go overhead, in a jumping jack or snow angel movement.    CONTINUE USING THE RED BAND TO COMPLETE THE FOLLOWING:  1) (Home) Extension: Isometric / Bilateral Arm Retraction - Sitting   Facing anchor, hold hands and elbow at shoulder height, with elbow bent.  Pull arms back to squeeze shoulder blades together. Repeat 10-15 times. 1-3 times/day.   2) (Clinic) Extension / Flexion (Assist)   Face anchor, pull arms back, keeping elbow straight, and squeze shoulder blades together. Repeat 10-15 times. 1-3 times/day.   Copyright  VHI. All rights reserved.   3) (Home) Retraction: Row - Bilateral (Anchor)   Facing anchor, arms reaching forward, pull hands toward stomach, keeping elbows bent and at your sides and pinching shoulder blades together. Repeat 10-15 times. 1-3 times/day.   Copyright  VHI. All rights reserved.

## 2020-03-15 ENCOUNTER — Encounter (HOSPITAL_COMMUNITY): Payer: Self-pay

## 2020-03-15 NOTE — Therapy (Signed)
[place], [place], [address] Phone: [phone]   Fax:  [phone]  Occupational Therapy Treatment Reassessment and discharge Patient Details  Name: Kurt [name] MRN: [id number] Date of Birth: 12-19-1950 Referring Provider (OT): Dr. Marcene Duos   Encounter Date: 03/14/2020  OT End of Session - 03/15/20 0941    Visit Number  14    Number of Visits  14    Authorization Type  [employer] medicare    Authorization Time Period  no visit limit. $35 copay.    Progress Note Due on Visit  19    OT Start Time  1430    OT Stop Time  1515    OT Time Calculation (min)  45 min    Activity Tolerance  Patient tolerated treatment well    Behavior During Therapy  WFL for tasks assessed/performed       Past Medical History:  Diagnosis Date  . Arthritis   . Bronchitis   . Bronchitis   . Concussion    late 1990's  after a fall  . Diabetes mellitus without complication ([place])   . GERD (gastroesophageal reflux disease)   . History of kidney stones   . Hypertension   . Osteogenesis imperfecta   . PONV (postoperative nausea and vomiting)    pt has post polio syndrome  . Post-polio syndrome     Past Surgical History:  Procedure Laterality Date  . ANKLE FRACTURE SURGERY Bilateral   . arm surgery Right    nerve surgery  . COLONOSCOPY    . ELBOW FRACTURE SURGERY Left   . EYE MUSCLE SURGERY Left   . LEG SURGERY Left    femur fracture with rod  . REVERSE SHOULDER ARTHROPLASTY Right 11/10/2019   Procedure: RIGHT REVERSE SHOULDER ARTHROPLASTY;  Surgeon: Meredith Pel, MD;  Location: [facility];  Service: Orthopedics;  Laterality: Right;  . TONSILLECTOMY      There were no vitals filed for this visit.  Subjective Assessment - 03/15/20 0911    Subjective   S: I am able to open the fridge now with my right arm. Too much actually. I need to stay out of the fridge.    Currently in Pain?  Yes    Pain Score  3     Pain  Location  Shoulder    Pain Orientation  Right    Pain Descriptors / Indicators  Sore    Pain Type  Acute pain    Pain Radiating Towards  N/A    Pain Onset  Today    Pain Frequency  Occasional    Aggravating Factors   stretches    Pain Relieving Factors  Not bad enought to.    Effect of Pain on Daily Activities  min effect    Multiple Pain Sites  No         [facility] OT Assessment - 03/14/20 1435      Assessment   Medical Diagnosis  Right shoulder S/P reverse total repair      Precautions   Precautions  Shoulder    Type of Shoulder Precautions  P/ROM, A/ROM as tolerated. No lifting over 15# long term. Patient has a history of polo which effected his right side with decreased strength at baseline.       Observation/Other Assessments   Focus on Therapeutic Outcomes (FOTO)   58/100      ROM / Strength   AROM / PROM / Strength  AROM;Strength  Palpation   Palpation comment  Trace fascial restrictions in right upper arm, trapezius, and scapularis region.      AROM   Overall AROM Comments  Assessed seated. IR/er adducted    AROM Assessment Site  Shoulder    Right/Left Shoulder  Right    Right Shoulder Flexion  140 Degrees   previous: 125   Right Shoulder ABduction  131 Degrees   previous: 125   Right Shoulder Internal Rotation  90 Degrees   previous: same   Right Shoulder External Rotation  50 Degrees   previous: 45     PROM   Overall PROM   Within functional limits for tasks performed    Overall PROM Comments  Assessed seated. Shoulder P/ROM in all ranges.       Strength   Overall Strength Comments  Assessed seated. IR/er adducted    Strength Assessment Site  Shoulder    Right/Left Shoulder  Right    Right Shoulder Flexion  4-/5   previous: 3/5   Right Shoulder ABduction  4/5   previous: 3+/5   Right Shoulder Internal Rotation  5/5   previous: 4+/5   Right Shoulder External Rotation  3/5   previous: 3/5              OT Treatments/Exercises (OP) -  03/14/20 1446      Exercises   Exercises  Shoulder      Shoulder Exercises: Seated   Protraction  Strengthening;12 reps    Protraction Weight (lbs)  1    Horizontal ABduction  Strengthening;10 reps    Horizontal ABduction Weight (lbs)  1    External Rotation  Strengthening;12 reps    External Rotation Weight (lbs)  1    Internal Rotation  Strengthening;12 reps    Internal Rotation Weight (lbs)  1    Flexion  Strengthening;10 reps    Flexion Weight (lbs)  1    Abduction  Strengthening;10 reps    ABduction Weight (lbs)  1      Shoulder Exercises: ROM/Strengthening   UBE (Upper Arm Bike)  level 3 2' forward 2' reverse    pace: 7.0-8.0            OT Education - 03/15/20 0940    Education Details  Reviewed HEP and updated as needed for strengthening. Education provided on exercise modification that changes as needed depending on energy and strength level.    Person(s) Educated  Patient    Methods  Explanation;Handout;Demonstration    Comprehension  Verbalized understanding;Returned demonstration       OT Short Term Goals - 02/17/20 1542      OT SHORT TERM GOAL #1   Title  Patient will be educated and independent with HEP in order to increase functional use of RUE during daily tasks and faciliate progress in therapy.    Time  3    Period  Weeks    Target Date  01/28/20      OT SHORT TERM GOAL #2   Title  Patient will increase RUE P/ROM to [facility] in order to increase ability to get shirts on and off with less difficulty.    Time  3    Period  Weeks      OT SHORT TERM GOAL #3   Title  Patient will increase RUE strength to 3+/5 in order to be able to complete lightweight lifting tasks at home.    Time  3    Period  Weeks  OT SHORT TERM GOAL #4   Title  Patient will decrease pain level to 3/10 during functional use activities.    Time  3    Period  Weeks      OT SHORT TERM GOAL #5   Title  Patient will decrease fascial restrictions to a minimal amount in the RUE  in order to increase functional mobility needed to complete reaching tasks.    Time  3    Period  Weeks        OT Long Term Goals - 03/15/20 6734      OT LONG TERM GOAL #1   Title  Patient will report increased independence with his RUE while using it for 75% or more of daily tasks.    Time  6    Period  Weeks    Status  Achieved      OT LONG TERM GOAL #2   Title  Patient will increase RUE A/ROM to [facility] in order to complete reaching tasks at or above shoulder level.    Time  6    Period  Weeks    Status  Achieved      OT LONG TERM GOAL #3   Title  Patient will increase RUE strength to 4/5 in order to return to lifting normal household items with less difficulty.    Baseline  3/30: patient tested 3/5 for external rotation.    Time  6    Period  Weeks    Status  Partially Met      OT LONG TERM GOAL #4   Title  Patient will report a decrease in pain level of approximately 2/10 or less when utilizing his RUE for daily tasks.    Baseline  3/29: Patient's pain level is typically at a 3/10 during daily tasks.    Time  6    Period  Weeks    Status  Partially Met            Plan - 03/15/20 0941    Clinical Impression Statement  A: Reassessment completed this date. All short term goals met 2/4 long term goals met fully with remaining 2 partially met. Patient demonstrates functional ROM. Strength and ROM do fluctuate depending on the day. patient has been educated on ways to modify tasks and exercises as needed. Strength has improved with external rotation remaining at 3/5. At this time, patient is ready to discharge with a HEP and continue working on strengthening. All education has been completed.    Body Structure / Function / Physical Skills  ADL;UE functional use;Fascial restriction;Pain;ROM;Strength    Plan  P: Discharge from OT services with HEP.    Consulted and Agree with Plan of Care  Patient       Patient will benefit from skilled therapeutic intervention in order to  improve the following deficits and impairments:   Body Structure / Function / Physical Skills: ADL, UE functional use, Fascial restriction, Pain, ROM, Strength       Visit Diagnosis: Other symptoms and signs involving the musculoskeletal system  Acute pain of right shoulder  Stiffness of right shoulder, not elsewhere classified    Problem List Patient Active Problem List   Diagnosis Date Noted  . 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 Cellulitis 02/24/2019  . 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 Osteogenesis imperfecta 12/25/2018  . Diabetes mellitus without complication ([employer]) 19/37/9024     OCCUPATIONAL THERAPY DISCHARGE SUMMARY  Visits from Start of Care: 14  Current functional level  related to goals / functional outcomes: See above   Remaining deficits: See above   Education / Equipment: See above Plan: Patient agrees to discharge.  Patient goals were met. Patient is being discharged due to meeting the stated rehab goals.  ?????        Ailene Ravel, OTR/L,CBIS  [id number] 03/15/2020, 9:46 AM  [place], [place], [address] Phone: [phone]   Fax:  [phone]  Name: CARRICK RIJOS MRN: [id number] Date of Birth: 1951/11/27

## 2020-03-29 ENCOUNTER — Encounter (INDEPENDENT_AMBULATORY_CARE_PROVIDER_SITE_OTHER): Payer: Self-pay | Admitting: Ophthalmology

## 2020-03-29 ENCOUNTER — Other Ambulatory Visit: Payer: Self-pay

## 2020-03-29 ENCOUNTER — Ambulatory Visit (INDEPENDENT_AMBULATORY_CARE_PROVIDER_SITE_OTHER): Payer: Medicare Other | Admitting: Ophthalmology

## 2020-03-29 DIAGNOSIS — E119 Type 2 diabetes mellitus without complications: Secondary | ICD-10-CM

## 2020-03-29 DIAGNOSIS — H35033 Hypertensive retinopathy, bilateral: Secondary | ICD-10-CM

## 2020-03-29 DIAGNOSIS — H3581 Retinal edema: Secondary | ICD-10-CM | POA: Diagnosis not present

## 2020-03-29 DIAGNOSIS — H25813 Combined forms of age-related cataract, bilateral: Secondary | ICD-10-CM

## 2020-03-29 DIAGNOSIS — H34831 Tributary (branch) retinal vein occlusion, right eye, with macular edema: Secondary | ICD-10-CM | POA: Diagnosis not present

## 2020-03-29 DIAGNOSIS — I10 Essential (primary) hypertension: Secondary | ICD-10-CM

## 2020-03-29 MED ORDER — BEVACIZUMAB CHEMO INJECTION 1.25MG/0.05ML SYRINGE FOR KALEIDOSCOPE
1.2500 mg | INTRAVITREAL | Status: AC | PRN
  Administered 2020-03-29: 1.25 mg via INTRAVITREAL

## 2020-03-29 NOTE — Progress Notes (Signed)
[facility] - Clinic Note  03/29/2020     CHIEF COMPLAINT Patient presents for Retina Follow Up   HISTORY OF PRESENT ILLNESS: Kurt [name] is a 69 y.o. male who presents to the clinic today for:   HPI    Retina Follow Up    Patient presents with  CRVO/BRVO.  In right eye.  This started months ago.  Severity is moderate.  Duration of 4 weeks.  Since onset it is gradually improving.  I, the attending physician,  performed the HPI with the patient and updated documentation appropriately.          Comments    69 y/o male pt here for 4 wk f/u for BRVO w/CME OD.  VA a little improved OU.  Denies pain, FOL, floaters, but eyes have been itching and burning a bit, more so in the mornings.  No gtts/.  BS 150 this a.m.  A1C unknown.       Last edited by Rennis Chris, MD on 03/29/2020  2:28 PM. (History)    Patient reports vision is stable  Referring physician: Altamease Oiler, FNP [address] [place],  [place] [address]  HISTORICAL INFORMATION:   Selected notes from the MEDICAL RECORD NUMBER Diabetic Eval per Dr. Karleen Hampshire   CURRENT MEDICATIONS: Current Outpatient Medications (Ophthalmic Drugs)  Medication Sig  . Olopatadine HCl 0.7 % SOLN Apply to eye.   No current facility-administered medications for this visit. (Ophthalmic Drugs)   Current Outpatient Medications (Other)  Medication Sig  . acetaminophen (TYLENOL) 650 MG CR tablet Take 1,300 mg by mouth every 8 (eight) hours as needed for pain.  Marland Kitchen aspirin 81 MG chewable tablet Chew 1 tablet (81 mg total) by mouth daily.  Marland Kitchen glipiZIDE (GLUCOTROL XL) 10 MG 24 hr tablet Take 10 mg by mouth at bedtime.   Marland Kitchen LANTUS SOLOSTAR 100 UNIT/ML Solostar Pen Inject 33 Units into the skin at bedtime.   Marland Kitchen lisinopril (PRINIVIL,ZESTRIL) 20 MG tablet Take 20 mg by mouth daily.  . methocarbamol (ROBAXIN) 500 MG tablet Take 500 mg by mouth at bedtime.   Marland Kitchen oxyCODONE (OXY IR/ROXICODONE) 5 MG immediate release tablet Take 1  tablet (5 mg total) by mouth every 8 (eight) hours as needed for moderate pain (pain score 4-6).  Marland Kitchen simvastatin (ZOCOR) 20 MG tablet Take 20 mg by mouth every evening.  . SitaGLIPtin-MetFORMIN HCl (JANUMET XR) [phone] MG TB24 Take 1 tablet by mouth at bedtime.   No current facility-administered medications for this visit. (Other)      REVIEW OF SYSTEMS: ROS    Positive for: Musculoskeletal, Endocrine, Eyes   Negative for: Constitutional, Gastrointestinal, Neurological, Skin, Genitourinary, HENT, Cardiovascular, Respiratory, Psychiatric, Allergic/Imm, Heme/Lymph   Last edited by Celine Mans, COA on 03/29/2020  2:14 PM. (History)       ALLERGIES Allergies  Allergen Reactions  . Augmentin [Amoxicillin-Pot Clavulanate] Nausea And Vomiting  . Iodine Hives  . Tape Rash    Paper     PAST MEDICAL HISTORY Past Medical History:  Diagnosis Date  . Arthritis   . Bronchitis   . Bronchitis   . Cataract    OU  . Concussion    late 1990's  after a fall  . Diabetes mellitus without complication ([facility])   . GERD (gastroesophageal reflux disease)   . History of kidney stones   . Hypertension   . Hypertensive retinopathy    OU  . Osteogenesis imperfecta   . PONV (  postoperative nausea and vomiting)    pt has post polio syndrome  . Post-polio syndrome    Past Surgical History:  Procedure Laterality Date  . ANKLE FRACTURE SURGERY Bilateral   . arm surgery Right    nerve surgery  . COLONOSCOPY    . ELBOW FRACTURE SURGERY Left   . EYE MUSCLE SURGERY Left   . LEG SURGERY Left    femur fracture with rod  . REVERSE SHOULDER ARTHROPLASTY Right 11/10/2019   Procedure: RIGHT REVERSE SHOULDER ARTHROPLASTY;  Surgeon: Cammy Copa, MD;  Location: [facility] OR;  Service: Orthopedics;  Laterality: Right;  . TONSILLECTOMY      FAMILY HISTORY Family History  Problem Relation Age of Onset  . Hypertension Mother   . Diabetes Brother     SOCIAL HISTORY Social History   Tobacco Use   . Smoking status: Never Smoker  . Smokeless tobacco: Never Used  Substance Use Topics  . Alcohol use: Yes    Comment: 1 beer occasionally  . Drug use: No         OPHTHALMIC EXAM:  Base Eye Exam    Visual Acuity (Snellen - Linear)      Right Left   Dist cc 20/30 -2 20/30 -2   Dist ph cc 20/25 -2 20/25 +2       Tonometry (Tonopen, 2:17 PM)      Right Left   Pressure 12 12       Pupils      Dark Light Shape React APD   Right 4 3 Round Brisk None   Left 4 3 Round Brisk None       Visual Fields (Counting fingers)      Left Right    Full Full       Extraocular Movement      Right Left    Full, Ortho Full, Ortho       Neuro/Psych    Oriented x3: Yes   Mood/Affect: Normal       Dilation    Both eyes: 1.0% Mydriacyl, 2.5% Phenylephrine @ 2:17 PM        Slit Lamp and Fundus Exam    Slit Lamp Exam      Right Left   Lids/Lashes Dermatochalasis - upper lid, Dermatochalasis - lower lid, mild Meibomian gland dysfunction Dermatochalasis - upper lid, Dermatochalasis - lower lid, mild Meibomian gland dysfunction   Conjunctiva/Sclera blue sclera blue sclera   Cornea arcus arcus   Anterior Chamber deep and clear deep and clear   Iris round and dilated, no NVI round and dilated, no NVI   Lens 2+ NS, 2+CS 2+ NS, 2+CS   Vitreous mild syneresis, PVD mild syneresis       Fundus Exam      Right Left   Disc pink and sharp pink and sharp   C/D Ratio 0.3 0.3   Macula blunted foveal reflex, focal edema -- improved, IRH, MA, and CWS superonasal macula -- improving flat, good foveal reflex, mild RPE mottling and clumping, +drusen, No heme or edema   Vessels mild attenuation, +A/V crossing changes, focal BRVO superior macula, mild Tortuousity mild attenuation and tortuosity   Periphery attached, no heme attached, no heme          IMAGING AND PROCEDURES  Imaging and Procedures for @TODAY @  OCT, Retina - OU - Both Eyes       Right Eye Quality was good. Central Foveal  Thickness: 281. Progression has improved. Findings include no SRF,  intraretinal fluid, retinal drusen , normal foveal contour (Mild Interval improvement in IRF/edema SN macula).   Left Eye Quality was good. Central Foveal Thickness: 285. Progression has been stable. Findings include normal foveal contour, no IRF, no SRF, vitreomacular adhesion , retinal drusen .   Notes *Images captured and stored on drive  Diagnosis / Impression:  OD: BRVO with focal CME superonasal macula -- Mild Interval improvement in IRF/edema SN macula OS: NFP, no SRF/IRF   Clinical management:  See below  Abbreviations: NFP - Normal foveal profile. CME - cystoid macular edema. PED - pigment epithelial detachment. IRF - intraretinal fluid. SRF - subretinal fluid. EZ - ellipsoid zone. ERM - epiretinal membrane. ORA - outer retinal atrophy. ORT - outer retinal tubulation. SRHM - subretinal hyper-reflective material         Intravitreal Injection, Pharmacologic Agent - OD - Right Eye       Time Out 03/29/2020. 2:05 PM. Confirmed correct patient, procedure, site, and patient consented.   Anesthesia Topical anesthesia was used. Anesthetic medications included Lidocaine 2%, Proparacaine 0.5%.   Procedure Preparation included 5% betadine to ocular surface, eyelid speculum. A (32g) needle was used.   Injection:  1.25 mg Bevacizumab (AVASTIN) SOLN   NDC: 31517-616-07, Lot: [id number] , Expiration date: 06/30/2020   Route: Intravitreal, Site: Right Eye, Waste: 0 mL  Post-op Post injection exam found visual acuity of at least counting fingers. The patient tolerated the procedure well. There were no complications. The patient received written and verbal post procedure care education.                 ASSESSMENT/PLAN:    ICD-10-CM   1. Branch retinal vein occlusion of right eye with macular edema  H34.8310 Intravitreal Injection, Pharmacologic Agent - OD - Right Eye    Bevacizumab (AVASTIN) SOLN 1.25 mg   2. Retinal edema  H35.81 OCT, Retina - OU - Both Eyes  3. Diabetes mellitus type 2 without retinopathy ([place])  E11.9   4. Essential hypertension  I10   5. Hypertensive retinopathy of both eyes  H35.033   6. Combined forms of age-related cataract of both eyes  H25.813     1,2. BRVO with CME OD  - s/p IVA OD #1 (01.19.21), #2 (02.16.21), #3 (03.16.21)  - BCVA 20/25, today -- improved  - exam with focal edema, IRH, CWS superonasal macula  - OCT shows interval improvement in IRF/edema SN macula  - recommend IVA OD #4 today,04.13.21  - RBA of procedure discussed, questions answered  - informed consent obtained and signed  - see procedure note  - F/U 4 weeks -- DFE/OCT/possible injection  3. Diabetes mellitus, type 2 without retinopathy OU  - The incidence, risk factors for progression, natural history and treatment options for diabetic retinopathy  were discussed with patient.    - The need for close monitoring of blood glucose, blood pressure, and serum lipids, avoiding cigarette or any type of tobacco, and the need for long term follow up was also discussed with patient.  - f/u in 1 year, sooner prn  4,5. Hypertensive retinopathy OU  - discussed importance of tight BP control  - monitor  6. Age related cataracts OU   - The symptoms of cataract, surgical options, and treatments and risks were discussed with patient.  - discussed diagnosis and progression  - not yet visually significant  - monitor for now   Ophthalmic Meds Ordered this visit:  Meds ordered this encounter  Medications  . Bevacizumab (AVASTIN)  SOLN 1.25 mg       Return in about 4 weeks (around 04/26/2020) for f/u BRVO OD, DFE, OCT.  There are no Patient Instructions on file for this visit.   Explained the diagnoses, plan, and follow up with the patient and they expressed understanding.  Patient expressed understanding of the importance of proper follow up care.   This document serves as a record of services  personally performed by Karie Chimera, MD, PhD. It was created on their behalf by Cristopher Estimable, COT an ophthalmic technician. The creation of this record is the provider's dictation and/or activities during the visit.    Electronically signed by: Cristopher Estimable, COT 03/29/20 @ 5:10 PM   This document serves as a record of services personally performed by Karie Chimera, MD, PhD. It was created on their behalf by Laurian Brim, OA, an ophthalmic assistant. The creation of this record is the provider's dictation and/or activities during the visit.    Electronically signed by: Laurian Brim, OA 04.13.2021 5:10 PM  Karie Chimera, M.D., Ph.D. Diseases & Surgery of the Retina and Vitreous [facility] 03/29/2020   I have reviewed the above documentation for accuracy and completeness, and I agree with the above. Karie Chimera, M.D., Ph.D. 03/29/20 5:10 PM   Abbreviations: M myopia (nearsighted); A astigmatism; H hyperopia (farsighted); P presbyopia; Mrx spectacle prescription;  CTL contact lenses; OD right eye; OS left eye; OU both eyes  XT exotropia; ET esotropia; PEK punctate epithelial keratitis; PEE punctate epithelial erosions; DES dry eye syndrome; MGD meibomian gland dysfunction; ATs artificial tears; PFAT's preservative free artificial tears; NSC nuclear sclerotic cataract; PSC posterior subcapsular cataract; ERM epi-retinal membrane; PVD posterior vitreous detachment; RD retinal detachment; DM diabetes mellitus; DR diabetic retinopathy; NPDR non-proliferative diabetic retinopathy; PDR proliferative diabetic retinopathy; CSME clinically significant macular edema; DME diabetic macular edema; dbh dot blot hemorrhages; CWS cotton wool spot; POAG primary open angle glaucoma; C/D cup-to-disc ratio; HVF humphrey visual field; GVF goldmann visual field; OCT optical coherence tomography; IOP intraocular pressure; BRVO Branch retinal vein occlusion; CRVO central retinal vein  occlusion; CRAO central retinal artery occlusion; BRAO branch retinal artery occlusion; RT retinal tear; SB scleral buckle; PPV pars plana vitrectomy; VH Vitreous hemorrhage; PRP panretinal laser photocoagulation; IVK intravitreal kenalog; VMT vitreomacular traction; MH Macular hole;  NVD neovascularization of the disc; NVE neovascularization elsewhere; AREDS age related eye disease study; ARMD age related macular degeneration; POAG primary open angle glaucoma; EBMD epithelial/anterior basement membrane dystrophy; ACIOL anterior chamber intraocular lens; IOL intraocular lens; PCIOL posterior chamber intraocular lens; Phaco/IOL phacoemulsification with intraocular lens placement; PRK photorefractive keratectomy; LASIK laser assisted in situ keratomileusis; HTN hypertension; DM diabetes mellitus; COPD chronic obstructive pulmonary disease

## 2020-04-26 ENCOUNTER — Encounter (INDEPENDENT_AMBULATORY_CARE_PROVIDER_SITE_OTHER): Payer: Self-pay | Admitting: Ophthalmology

## 2020-04-26 ENCOUNTER — Other Ambulatory Visit: Payer: Self-pay

## 2020-04-26 ENCOUNTER — Ambulatory Visit (INDEPENDENT_AMBULATORY_CARE_PROVIDER_SITE_OTHER): Payer: Medicare Other | Admitting: Ophthalmology

## 2020-04-26 DIAGNOSIS — I10 Essential (primary) hypertension: Secondary | ICD-10-CM

## 2020-04-26 DIAGNOSIS — E119 Type 2 diabetes mellitus without complications: Secondary | ICD-10-CM | POA: Diagnosis not present

## 2020-04-26 DIAGNOSIS — H35033 Hypertensive retinopathy, bilateral: Secondary | ICD-10-CM

## 2020-04-26 DIAGNOSIS — H34831 Tributary (branch) retinal vein occlusion, right eye, with macular edema: Secondary | ICD-10-CM

## 2020-04-26 DIAGNOSIS — H3581 Retinal edema: Secondary | ICD-10-CM

## 2020-04-26 DIAGNOSIS — H25813 Combined forms of age-related cataract, bilateral: Secondary | ICD-10-CM

## 2020-04-26 MED ORDER — AFLIBERCEPT 2MG/0.05ML IZ SOLN FOR KALEIDOSCOPE
2.0000 mg | INTRAVITREAL | Status: AC | PRN
  Administered 2020-04-26: 2 mg via INTRAVITREAL

## 2020-04-26 NOTE — Progress Notes (Signed)
[facility] - Clinic Note  04/26/2020     CHIEF COMPLAINT Patient presents for Retina Follow Up   HISTORY OF PRESENT ILLNESS: Kurt [name] is a 69 y.o. male who presents to the clinic today for:   HPI    Retina Follow Up    Patient presents with  CRVO/BRVO.  In both eyes.  Duration of 4 weeks.  Since onset it is stable.  I, the attending physician,  performed the HPI with the patient and updated documentation appropriately.          Comments    Vision appears stable since last visit OU.  Unable to see red on black (nothing new).  Tearing OU with burning OD.  Denies using eye drops. He had a drops for dryness he believes.  It was a Rx that cost $50.       Last edited by Rennis Chris, MD on 04/26/2020  3:59 PM. (History)    Patient states he feels like he can "see a little better", he states his eyes water a little  Referring physician: Altamease Oiler, FNP [address] [place],  [place] [address]  HISTORICAL INFORMATION:   Selected notes from the MEDICAL RECORD NUMBER Diabetic Eval per Dr. Karleen Hampshire   CURRENT MEDICATIONS: Current Outpatient Medications (Ophthalmic Drugs)  Medication Sig  . Olopatadine HCl 0.7 % SOLN Apply to eye.   No current facility-administered medications for this visit. (Ophthalmic Drugs)   Current Outpatient Medications (Other)  Medication Sig  . acetaminophen (TYLENOL) 650 MG CR tablet Take 1,300 mg by mouth every 8 (eight) hours as needed for pain.  Marland Kitchen aspirin 81 MG chewable tablet Chew 1 tablet (81 mg total) by mouth daily.  Marland Kitchen glipiZIDE (GLUCOTROL XL) 10 MG 24 hr tablet Take 10 mg by mouth at bedtime.   Marland Kitchen LANTUS SOLOSTAR 100 UNIT/ML Solostar Pen Inject 33 Units into the skin at bedtime.   Marland Kitchen lisinopril (PRINIVIL,ZESTRIL) 20 MG tablet Take 20 mg by mouth daily.  . methocarbamol (ROBAXIN) 500 MG tablet Take 500 mg by mouth at bedtime.   Marland Kitchen oxyCODONE (OXY IR/ROXICODONE) 5 MG immediate release tablet Take 1 tablet (5 mg  total) by mouth every 8 (eight) hours as needed for moderate pain (pain score 4-6).  Marland Kitchen simvastatin (ZOCOR) 20 MG tablet Take 20 mg by mouth every evening.  . SitaGLIPtin-MetFORMIN HCl (JANUMET XR) [phone] MG TB24 Take 1 tablet by mouth at bedtime.   No current facility-administered medications for this visit. (Other)      REVIEW OF SYSTEMS: ROS    Positive for: Musculoskeletal, Endocrine, Eyes   Negative for: Constitutional, Gastrointestinal, Neurological, Skin, Genitourinary, HENT, Cardiovascular, Respiratory, Psychiatric, Allergic/Imm, Heme/Lymph   Last edited by Joni Reining, COA on 04/26/2020  2:42 PM. (History)       ALLERGIES Allergies  Allergen Reactions  . Augmentin [Amoxicillin-Pot Clavulanate] Nausea And Vomiting  . Iodine Hives  . Tape Rash    Paper     PAST MEDICAL HISTORY Past Medical History:  Diagnosis Date  . Arthritis   . Bronchitis   . Bronchitis   . Cataract    OU  . Concussion    late 1990's  after a fall  . Diabetes mellitus without complication (HCC)   . GERD (gastroesophageal reflux disease)   . History of kidney stones   . Hypertension   . Hypertensive retinopathy    OU  . Osteogenesis imperfecta   . PONV (postoperative nausea and vomiting)  pt has post polio syndrome  . Post-polio syndrome    Past Surgical History:  Procedure Laterality Date  . ANKLE FRACTURE SURGERY Bilateral   . arm surgery Right    nerve surgery  . COLONOSCOPY    . ELBOW FRACTURE SURGERY Left   . EYE MUSCLE SURGERY Left   . LEG SURGERY Left    femur fracture with rod  . REVERSE SHOULDER ARTHROPLASTY Right 11/10/2019   Procedure: RIGHT REVERSE SHOULDER ARTHROPLASTY;  Surgeon: Cammy Copa, MD;  Location: [facility];  Service: Orthopedics;  Laterality: Right;  . TONSILLECTOMY      FAMILY HISTORY Family History  Problem Relation Age of Onset  . Hypertension Mother   . Diabetes Brother     SOCIAL HISTORY Social History   Tobacco Use  . Smoking  status: Never Smoker  . Smokeless tobacco: Never Used  Substance Use Topics  . Alcohol use: Yes    Comment: 1 beer occasionally  . Drug use: No         OPHTHALMIC EXAM:  Base Eye Exam    Visual Acuity (Snellen - Linear)      Right Left   Dist cc 20/30 -2 20/25 +2   Correction: Glasses       Tonometry (Tonopen, 2:49 PM)      Right Left   Pressure 12 13       Pupils      Dark Light Shape React APD   Right 3 2 Round Brisk None   Left 3 2 Round Brisk None       Visual Fields (Counting fingers)      Left Right    Full Full       Extraocular Movement      Right Left    Full Full       Neuro/Psych    Oriented x3: Yes   Mood/Affect: Normal       Dilation    Both eyes: 1.0% Mydriacyl, 2.5% Phenylephrine @ 2:46 PM        Slit Lamp and Fundus Exam    Slit Lamp Exam      Right Left   Lids/Lashes Dermatochalasis - upper lid, Dermatochalasis - lower lid, mild Meibomian gland dysfunction Dermatochalasis - upper lid, Dermatochalasis - lower lid, mild Meibomian gland dysfunction   Conjunctiva/Sclera blue sclera blue sclera   Cornea arcus arcus   Anterior Chamber deep and clear deep and clear   Iris round and dilated, no NVI round and dilated, no NVI   Lens 2+ NS, 2+CS 2+ NS, 2+CS   Vitreous mild syneresis, PVD mild syneresis       Fundus Exam      Right Left   Disc pink and sharp pink and sharp   C/D Ratio 0.3 0.3   Macula blunted foveal reflex, persistent, focal edema SN macula, IRH, MA, and CWS superonasal macula -- improving flat, good foveal reflex, mild RPE mottling and clumping, +drusen, No heme or edema   Vessels mild attenuation, +A/V crossing changes, focal BRVO superior macula, mild Tortuousity mild attenuation and tortuosity   Periphery attached, no heme attached, no heme          IMAGING AND PROCEDURES  Imaging and Procedures for @TODAY @  OCT, Retina - OU - Both Eyes       Right Eye Quality was good. Central Foveal Thickness: 303.  Progression has worsened. Findings include no SRF, intraretinal fluid, retinal drusen , normal foveal contour (Interval increase in IRF/edema SN  macula).   Left Eye Quality was good. Central Foveal Thickness: 256. Progression has been stable. Findings include normal foveal contour, no IRF, no SRF, vitreomacular adhesion , retinal drusen .   Notes *Images captured and stored on drive  Diagnosis / Impression:  OD: BRVO with focal CME superonasal macula -- Interval increase in IRF/edema SN macula OS: NFP, no SRF/IRF   Clinical management:  See below  Abbreviations: NFP - Normal foveal profile. CME - cystoid macular edema. PED - pigment epithelial detachment. IRF - intraretinal fluid. SRF - subretinal fluid. EZ - ellipsoid zone. ERM - epiretinal membrane. ORA - outer retinal atrophy. ORT - outer retinal tubulation. SRHM - subretinal hyper-reflective material         Intravitreal Injection, Pharmacologic Agent - OD - Right Eye       Time Out 04/26/2020. 3:29 PM. Confirmed correct patient, procedure, site, and patient consented.   Anesthesia Topical anesthesia was used. Anesthetic medications included Lidocaine 2%, Proparacaine 0.5%.   Procedure Preparation included 5% betadine to ocular surface, eyelid speculum. A (33g) needle was used.   Injection:  2 mg aflibercept Alfonse Flavors) SOLN   NDC: 23762-831-51, Lot: [id number], Expiration date: 10/12/2020   Route: Intravitreal, Site: Right Eye, Waste: 0.05 mL  Post-op Post injection exam found visual acuity of at least counting fingers. The patient tolerated the procedure well. There were no complications. The patient received written and verbal post procedure care education.   Notes **SAMPLE MEDICATION ADMINISTERED**                 ASSESSMENT/PLAN:    ICD-10-CM   1. Branch retinal vein occlusion of right eye with macular edema  H34.8310 Intravitreal Injection, Pharmacologic Agent - OD - Right Eye    aflibercept (EYLEA) SOLN  2 mg  2. Retinal edema  H35.81 OCT, Retina - OU - Both Eyes  3. Diabetes mellitus type 2 without retinopathy ([place])  E11.9   4. Essential hypertension  I10   5. Hypertensive retinopathy of both eyes  H35.033   6. Combined forms of age-related cataract of both eyes  H25.813     1,2. BRVO with CME OD  - s/p IVA OD #1 (01.19.21), #2 (02.16.21), #3 (03.16.21), #4 (4.13.21)  - BCVA 20/30 OD stable  - exam with focal edema, IRH, CWS superonasal macula  - OCT shows interval increase in IRF/edema SN macula  - discussed possible resistance to IVA and possibility of switching medication  - recommend IVE OD #1 today, 5.11.21 (sample)  - RBA of procedure discussed, questions answered  - informed consent obtained  - Avastin informed consent form signed and scanned on 01.19.21  - Eylea informed consent form signed and scanned on 05.11.21  - see procedure note  - Eylea4U benefits investigation started, 05.11.21  - F/U 4 weeks -- DFE/OCT/possible injection  3. Diabetes mellitus, type 2 without retinopathy OU  - The incidence, risk factors for progression, natural history and treatment options for diabetic retinopathy  were discussed with patient.    - The need for close monitoring of blood glucose, blood pressure, and serum lipids, avoiding cigarette or any type of tobacco, and the need for long term follow up was also discussed with patient.  - f/u in 1 year, sooner prn  4,5. Hypertensive retinopathy OU  - discussed importance of tight BP control  - monitor  6. Age related cataracts OU   - The symptoms of cataract, surgical options, and treatments and risks were discussed with patient.  -  discussed diagnosis and progression  - not yet visually significant  - monitor for now   Ophthalmic Meds Ordered this visit:  Meds ordered this encounter  Medications  . aflibercept (EYLEA) SOLN 2 mg       Return in about 4 weeks (around 05/24/2020) for f/u BRVO OD, DFE, OCT.  There are no Patient  Instructions on file for this visit.   Explained the diagnoses, plan, and follow up with the patient and they expressed understanding.  Patient expressed understanding of the importance of proper follow up care.   This document serves as a record of services personally performed by Karie Chimera, MD, PhD. It was created on their behalf by Laurian Brim, OA, an ophthalmic assistant. The creation of this record is the provider's dictation and/or activities during the visit.    Electronically signed by: Laurian Brim, OA 05.11.2021 11:21 PM  Karie Chimera, M.D., Ph.D. Diseases & Surgery of the Retina and Vitreous [facility] 04/26/2020   I have reviewed the above documentation for accuracy and completeness, and I agree with the above. Karie Chimera, M.D., Ph.D. 04/26/20 11:25 PM   Abbreviations: M myopia (nearsighted); A astigmatism; H hyperopia (farsighted); P presbyopia; Mrx spectacle prescription;  CTL contact lenses; OD right eye; OS left eye; OU both eyes  XT exotropia; ET esotropia; PEK punctate epithelial keratitis; PEE punctate epithelial erosions; DES dry eye syndrome; MGD meibomian gland dysfunction; ATs artificial tears; PFAT's preservative free artificial tears; NSC nuclear sclerotic cataract; PSC posterior subcapsular cataract; ERM epi-retinal membrane; PVD posterior vitreous detachment; RD retinal detachment; DM diabetes mellitus; DR diabetic retinopathy; NPDR non-proliferative diabetic retinopathy; PDR proliferative diabetic retinopathy; CSME clinically significant macular edema; DME diabetic macular edema; dbh dot blot hemorrhages; CWS cotton wool spot; POAG primary open angle glaucoma; C/D cup-to-disc ratio; HVF humphrey visual field; GVF goldmann visual field; OCT optical coherence tomography; IOP intraocular pressure; BRVO Branch retinal vein occlusion; CRVO central retinal vein occlusion; CRAO central retinal artery occlusion; BRAO branch retinal artery  occlusion; RT retinal tear; SB scleral buckle; PPV pars plana vitrectomy; VH Vitreous hemorrhage; PRP panretinal laser photocoagulation; IVK intravitreal kenalog; VMT vitreomacular traction; MH Macular hole;  NVD neovascularization of the disc; NVE neovascularization elsewhere; AREDS age related eye disease study; ARMD age related macular degeneration; POAG primary open angle glaucoma; EBMD epithelial/anterior basement membrane dystrophy; ACIOL anterior chamber intraocular lens; IOL intraocular lens; PCIOL posterior chamber intraocular lens; Phaco/IOL phacoemulsification with intraocular lens placement; PRK photorefractive keratectomy; LASIK laser assisted in situ keratomileusis; HTN hypertension; DM diabetes mellitus; COPD chronic obstructive pulmonary disease

## 2020-05-23 NOTE — Progress Notes (Signed)
[facility] - Clinic Note  05/24/2020     CHIEF COMPLAINT Patient presents for Retina Follow Up   HISTORY OF PRESENT ILLNESS: Kurt [name] is a 69 y.o. male who presents to the clinic today for:   HPI    Retina Follow Up    Patient presents with  CRVO/BRVO.  In right eye.  This started 4 weeks ago.  Severity is moderate.  I, the attending physician,  performed the HPI with the patient and updated documentation appropriately.          Comments    Patient here for 4 weeks retina follow up for BRVO OD. Patient states vision about the same. No changes. No worse. No eye pain. Eye itches sometimes. Yesterday A!C 7.5       Last edited by Rennis Chris, MD on 05/24/2020  1:08 PM. (History)    Patient states his vision is a little more clear today, pt had his A1c checked yesterday and it was 7.5, down from 8.5  Referring physician:  Altamease Oiler, FNP [address] [place],  [place] [address]  HISTORICAL INFORMATION:   Selected notes from the MEDICAL RECORD NUMBER Diabetic Eval per Dr. Karleen Hampshire   CURRENT MEDICATIONS: Current Outpatient Medications (Ophthalmic Drugs)  Medication Sig   Olopatadine HCl 0.7 % SOLN Apply to eye.   No current facility-administered medications for this visit. (Ophthalmic Drugs)   Current Outpatient Medications (Other)  Medication Sig   acetaminophen (TYLENOL) 650 MG CR tablet Take 1,300 mg by mouth every 8 (eight) hours as needed for pain.   aspirin 81 MG chewable tablet Chew 1 tablet (81 mg total) by mouth daily.   glipiZIDE (GLUCOTROL XL) 10 MG 24 hr tablet Take 10 mg by mouth at bedtime.    LANTUS SOLOSTAR 100 UNIT/ML Solostar Pen Inject 33 Units into the skin at bedtime.    lisinopril (PRINIVIL,ZESTRIL) 20 MG tablet Take 20 mg by mouth daily.   methocarbamol (ROBAXIN) 500 MG tablet Take 500 mg by mouth at bedtime.    oxyCODONE (OXY IR/ROXICODONE) 5 MG immediate release tablet Take 1 tablet (5 mg total) by mouth  every 8 (eight) hours as needed for moderate pain (pain score 4-6).   simvastatin (ZOCOR) 20 MG tablet Take 20 mg by mouth every evening.   SitaGLIPtin-MetFORMIN HCl (JANUMET XR) [phone] MG TB24 Take 1 tablet by mouth at bedtime.   No current facility-administered medications for this visit. (Other)      REVIEW OF SYSTEMS: ROS    Positive for: Gastrointestinal, Musculoskeletal, Endocrine, Eyes   Negative for: Constitutional, Neurological, Skin, Genitourinary, HENT, Cardiovascular, Respiratory, Psychiatric, Allergic/Imm, Heme/Lymph   Last edited by Laddie Aquas, COA on 05/24/2020 12:52 PM. (History)       ALLERGIES Allergies  Allergen Reactions   Augmentin [Amoxicillin-Pot Clavulanate] Nausea And Vomiting   Iodine Hives   Tape Rash    Paper     PAST MEDICAL HISTORY Past Medical History:  Diagnosis Date   Arthritis    Bronchitis    Bronchitis    Cataract    OU   Concussion    late 1990's  after a fall   Diabetes mellitus without complication (HCC)    GERD (gastroesophageal reflux disease)    History of kidney stones    Hypertension    Hypertensive retinopathy    OU   Osteogenesis imperfecta    PONV (postoperative nausea and vomiting)    pt has post polio syndrome  Post-polio syndrome    Past Surgical History:  Procedure Laterality Date   ANKLE FRACTURE SURGERY Bilateral    arm surgery Right    nerve surgery   COLONOSCOPY     ELBOW FRACTURE SURGERY Left    EYE MUSCLE SURGERY Left    LEG SURGERY Left    femur fracture with rod   REVERSE SHOULDER ARTHROPLASTY Right 11/10/2019   Procedure: RIGHT REVERSE SHOULDER ARTHROPLASTY;  Surgeon: Meredith Pel, MD;  Location: [facility];  Service: Orthopedics;  Laterality: Right;   TONSILLECTOMY      FAMILY HISTORY Family History  Problem Relation Age of Onset   Hypertension Mother    Diabetes Brother     SOCIAL HISTORY Social History   Tobacco Use   Smoking status: Never  Smoker   Smokeless tobacco: Never Used  Substance Use Topics   Alcohol use: Yes    Comment: 1 beer occasionally   Drug use: No         OPHTHALMIC EXAM:  Base Eye Exam    Visual Acuity (Snellen - Linear)      Right Left   Dist cc 20/30 +1 20/25 +2   Dist ph cc 20/20 -2 20/20 -1   Correction: Glasses       Tonometry (Tonopen, 12:48 PM)      Right Left   Pressure 11 12       Pupils      Dark Light Shape React APD   Right 3 2 Round Brisk None   Left 3 2 Round Brisk None       Visual Fields (Counting fingers)      Left Right    Full Full       Extraocular Movement      Right Left    Full, Ortho Full, Ortho       Neuro/Psych    Oriented x3: Yes   Mood/Affect: Normal       Dilation    Both eyes: 1.0% Mydriacyl, 2.5% Phenylephrine @ 12:48 PM        Slit Lamp and Fundus Exam    Slit Lamp Exam      Right Left   Lids/Lashes Dermatochalasis - upper lid, Dermatochalasis - lower lid, mild Meibomian gland dysfunction Dermatochalasis - upper lid, Dermatochalasis - lower lid, mild Meibomian gland dysfunction   Conjunctiva/Sclera blue sclera blue sclera   Cornea arcus arcus   Anterior Chamber deep and clear deep and clear   Iris round and dilated, no NVI round and dilated, no NVI   Lens 2+ NS, 2+CS 2+ NS, 2+CS   Vitreous mild syneresis, PVD mild syneresis       Fundus Exam      Right Left   Disc pink and sharp pink and sharp   C/D Ratio 0.3 0.3   Macula blunted foveal reflex, persistent, focal edema SN macula - improving, IRH, MA, and CWS superonasal macula -- improving flat, good foveal reflex, mild RPE mottling and clumping, +drusen, No heme or edema   Vessels mild attenuation, +A/V crossing changes, focal BRVO superior macula, mild Tortuousity mild attenuation and tortuosity   Periphery attached, no heme attached, no heme        Refraction    Wearing Rx      Sphere Cylinder Axis Add   Right -2.00 +2.25 111 +2.75   Left -1.50 +1.50 094 2.75           IMAGING AND PROCEDURES  Imaging and Procedures for @TODAY @  OCT, Retina - OU - Both Eyes       Right Eye Quality was good. Central Foveal Thickness: 267. Progression has improved. Findings include no SRF, intraretinal fluid, retinal drusen , normal foveal contour (Interval improvement in IRF/IRHM).   Left Eye Quality was good. Central Foveal Thickness: 258. Progression has been stable. Findings include normal foveal contour, no IRF, no SRF, vitreomacular adhesion , retinal drusen .   Notes *Images captured and stored on drive  Diagnosis / Impression:  OD: BRVO with focal CME superonasal macula -- Interval improvment in IRF/IRHM OS: NFP, no SRF/IRF  Clinical management:  See below  Abbreviations: NFP - Normal foveal profile. CME - cystoid macular edema. PED - pigment epithelial detachment. IRF - intraretinal fluid. SRF - subretinal fluid. EZ - ellipsoid zone. ERM - epiretinal membrane. ORA - outer retinal atrophy. ORT - outer retinal tubulation. SRHM - subretinal hyper-reflective material         Intravitreal Injection, Pharmacologic Agent - OD - Right Eye       Time Out 05/24/2020. 1:11 PM. Confirmed correct patient, procedure, site, and patient consented.   Anesthesia Topical anesthesia was used. Anesthetic medications included Lidocaine 2%, Proparacaine 0.5%.   Procedure Preparation included 5% betadine to ocular surface, eyelid speculum. A (32g) needle was used.   Injection:  2 mg aflibercept Gretta Cool) SOLN   NDC: L6038910, Lot: [id number], Expiration date: 09/12/2020   Route: Intravitreal, Site: Right Eye, Waste: 0.05 mL  Post-op Post injection exam found visual acuity of at least counting fingers. The patient tolerated the procedure well. There were no complications. The patient received written and verbal post procedure care education.                 ASSESSMENT/PLAN:    ICD-10-CM   1. Branch retinal vein occlusion of right eye with macular edema   H34.8310 Intravitreal Injection, Pharmacologic Agent - OD - Right Eye    aflibercept (EYLEA) SOLN 2 mg  2. Retinal edema  H35.81 OCT, Retina - OU - Both Eyes  3. Diabetes mellitus type 2 without retinopathy (HCC)  E11.9   4. Combined forms of age-related cataract of both eyes  H25.813   5. Essential hypertension  I10   6. Hypertensive retinopathy of both eyes  H35.033     1,2. BRVO with CME OD  - s/p IVA OD #1 (01.19.21), #2 (02.16.21), #3 (03.16.21), #4 (4.13.21) -- IVA resistance             - s/p IVE OD #1 (05.11.21--sample)  - BCVA improved to 20/20 from 20/30 OD  - exam with focal edema, IRH, CWS superonasal macula -- improved  - OCT shows interval decrease in IRF/edema SN macula  - recommend IVE OD #2 today, 06.08.21  - RBA of procedure discussed, questions answered  - informed consent obtained  - Avastin informed consent form signed and scanned on 01.19.21  - Eylea informed consent form signed and scanned on 05.11.21  - see procedure note  - Eylea4U benefits investigation started, 05.11.21 -- approved through [employer] through 12.31.21  - F/U 4 weeks -- DFE/OCT/possible injection  3. Diabetes mellitus, type 2 without retinopathy OU  - The incidence, risk factors for progression, natural history and treatment options for diabetic retinopathy  were discussed with patient.    - The need for close monitoring of blood glucose, blood pressure, and serum lipids, avoiding cigarette or any type of tobacco, and the need for long term follow up was also discussed  with patient.  - f/u in 1 year, sooner prn  4,5. Hypertensive retinopathy OU  - discussed importance of tight BP control  - monitor  6. Age related cataracts OU   - The symptoms of cataract, surgical options, and treatments and risks were discussed with patient.  - discussed diagnosis and progression  - not yet visually significant  - monitor for now   Ophthalmic Meds Ordered this visit:  Meds ordered this encounter   Medications   aflibercept (EYLEA) SOLN 2 mg       Return in about 4 weeks (around 06/21/2020) for f/u 4 weeks, BRVO OD, DFE, OCT.  There are no Patient Instructions on file for this visit.   Explained the diagnoses, plan, and follow up with the patient and they expressed understanding.  Patient expressed understanding of the importance of proper follow up care.   This document serves as a record of services personally performed by Karie Chimera, MD, PhD. It was created on their behalf by Herby Abraham, COA, a certified ophthalmic assistant. The creation of this record is the provider's dictation and/or activities during the visit.    Electronically signed by: Herby Abraham, COA @TODAY @ 1:45 PM  , M.D., Ph.D. Diseases & Surgery of the Retina and Vitreous [facility]  I have reviewed the above documentation for accuracy and completeness, and I agree with the above. [facility], M.D., Ph.D. 05/24/20 1:45 PM   Abbreviations: M myopia (nearsighted); A astigmatism; H hyperopia (farsighted); P presbyopia; Mrx spectacle prescription;  CTL contact lenses; OD right eye; OS left eye; OU both eyes  XT exotropia; ET esotropia; PEK punctate epithelial keratitis; PEE punctate epithelial erosions; DES dry eye syndrome; MGD meibomian gland dysfunction; ATs artificial tears; PFAT's preservative free artificial tears; NSC nuclear sclerotic cataract; PSC posterior subcapsular cataract; ERM epi-retinal membrane; PVD posterior vitreous detachment; RD retinal detachment; DM diabetes mellitus; DR diabetic retinopathy; NPDR non-proliferative diabetic retinopathy; PDR proliferative diabetic retinopathy; CSME clinically significant macular edema; DME diabetic macular edema; dbh dot blot hemorrhages; CWS cotton wool spot; POAG primary open angle glaucoma; C/D cup-to-disc ratio; HVF humphrey visual field; GVF goldmann visual field; OCT optical coherence tomography; IOP  intraocular pressure; BRVO Branch retinal vein occlusion; CRVO central retinal vein occlusion; CRAO central retinal artery occlusion; BRAO branch retinal artery occlusion; RT retinal tear; SB scleral buckle; PPV pars plana vitrectomy; VH Vitreous hemorrhage; PRP panretinal laser photocoagulation; IVK intravitreal kenalog; VMT vitreomacular traction; MH Macular hole;  NVD neovascularization of the disc; NVE neovascularization elsewhere; AREDS age related eye disease study; ARMD age related macular degeneration; POAG primary open angle glaucoma; EBMD epithelial/anterior basement membrane dystrophy; ACIOL anterior chamber intraocular lens; IOL intraocular lens; PCIOL posterior chamber intraocular lens; Phaco/IOL phacoemulsification with intraocular lens placement; PRK photorefractive keratectomy; LASIK laser assisted in situ keratomileusis; HTN hypertension; DM diabetes mellitus; COPD chronic obstructive pulmonary disease

## 2020-05-24 ENCOUNTER — Encounter (INDEPENDENT_AMBULATORY_CARE_PROVIDER_SITE_OTHER): Payer: Self-pay | Admitting: Ophthalmology

## 2020-05-24 ENCOUNTER — Ambulatory Visit (INDEPENDENT_AMBULATORY_CARE_PROVIDER_SITE_OTHER): Payer: Medicare Other | Admitting: Ophthalmology

## 2020-05-24 ENCOUNTER — Other Ambulatory Visit: Payer: Self-pay

## 2020-05-24 DIAGNOSIS — E119 Type 2 diabetes mellitus without complications: Secondary | ICD-10-CM

## 2020-05-24 DIAGNOSIS — I10 Essential (primary) hypertension: Secondary | ICD-10-CM

## 2020-05-24 DIAGNOSIS — H25813 Combined forms of age-related cataract, bilateral: Secondary | ICD-10-CM | POA: Diagnosis not present

## 2020-05-24 DIAGNOSIS — H3581 Retinal edema: Secondary | ICD-10-CM | POA: Diagnosis not present

## 2020-05-24 DIAGNOSIS — H35033 Hypertensive retinopathy, bilateral: Secondary | ICD-10-CM

## 2020-05-24 DIAGNOSIS — H34831 Tributary (branch) retinal vein occlusion, right eye, with macular edema: Secondary | ICD-10-CM | POA: Diagnosis not present

## 2020-05-24 MED ORDER — AFLIBERCEPT 2MG/0.05ML IZ SOLN FOR KALEIDOSCOPE
2.0000 mg | INTRAVITREAL | Status: AC | PRN
  Administered 2020-05-24: 2 mg via INTRAVITREAL

## 2020-06-17 NOTE — Progress Notes (Signed)
[facility] - Clinic Note  06/21/2020     CHIEF COMPLAINT Patient presents for Retina Follow Up   HISTORY OF PRESENT ILLNESS: Kurt [name] is a 69 y.o. male who presents to the clinic today for:   HPI    Retina Follow Up    Patient presents with  CRVO/BRVO.  In right eye.  This started 4 weeks ago.  Severity is moderate.  I, the attending physician,  performed the HPI with the patient and updated documentation appropriately.          Comments    Patient here for 4 weeks retina follow up for BRVO OD. Patient states vision doing better today. Has watery eyes and they itch on occasion. Has discomfort sometimes. No eye pain. The eye drop using is Pazeo QAM OU.       Last edited by Rennis Chris, MD on 06/21/2020 11:04 PM. (History)    Patient states his vision is the same, he is using an allergy eye drop  Referring physician:  Altamease Oiler, FNP [address] [place],  [place] [address]  HISTORICAL INFORMATION:   Selected notes from the MEDICAL RECORD NUMBER Diabetic Eval per Dr. Karleen Hampshire   CURRENT MEDICATIONS: Current Outpatient Medications (Ophthalmic Drugs)  Medication Sig  . Olopatadine HCl 0.7 % SOLN Apply to eye.   No current facility-administered medications for this visit. (Ophthalmic Drugs)   Current Outpatient Medications (Other)  Medication Sig  . acetaminophen (TYLENOL) 650 MG CR tablet Take 1,300 mg by mouth every 8 (eight) hours as needed for pain.  Marland Kitchen aspirin 81 MG chewable tablet Chew 1 tablet (81 mg total) by mouth daily.  Marland Kitchen glipiZIDE (GLUCOTROL XL) 10 MG 24 hr tablet Take 10 mg by mouth at bedtime.   Marland Kitchen LANTUS SOLOSTAR 100 UNIT/ML Solostar Pen Inject 33 Units into the skin at bedtime.   Marland Kitchen lisinopril (PRINIVIL,ZESTRIL) 20 MG tablet Take 20 mg by mouth daily.  . methocarbamol (ROBAXIN) 500 MG tablet Take 500 mg by mouth at bedtime.   Marland Kitchen oxyCODONE (OXY IR/ROXICODONE) 5 MG immediate release tablet Take 1 tablet (5 mg total) by mouth  every 8 (eight) hours as needed for moderate pain (pain score 4-6).  Marland Kitchen simvastatin (ZOCOR) 20 MG tablet Take 20 mg by mouth every evening.  . SitaGLIPtin-MetFORMIN HCl (JANUMET XR) [phone] MG TB24 Take 1 tablet by mouth at bedtime.   No current facility-administered medications for this visit. (Other)      REVIEW OF SYSTEMS: ROS    Positive for: Gastrointestinal, Musculoskeletal, Endocrine, Eyes   Negative for: Constitutional, Neurological, Skin, Genitourinary, HENT, Cardiovascular, Respiratory, Psychiatric, Allergic/Imm, Heme/Lymph   Last edited by Laddie Aquas, COA on 06/21/2020  3:18 PM. (History)       ALLERGIES Allergies  Allergen Reactions  . Augmentin [Amoxicillin-Pot Clavulanate] Nausea And Vomiting  . Iodine Hives  . Tape Rash    Paper     PAST MEDICAL HISTORY Past Medical History:  Diagnosis Date  . Arthritis   . Bronchitis   . Bronchitis   . Cataract    OU  . Concussion    late 1990's  after a fall  . Diabetes mellitus without complication (HCC)   . GERD (gastroesophageal reflux disease)   . History of kidney stones   . Hypertension   . Hypertensive retinopathy    OU  . Osteogenesis imperfecta   . PONV (postoperative nausea and vomiting)    pt has post polio syndrome  .  Post-polio syndrome    Past Surgical History:  Procedure Laterality Date  . ANKLE FRACTURE SURGERY Bilateral   . arm surgery Right    nerve surgery  . COLONOSCOPY    . ELBOW FRACTURE SURGERY Left   . EYE MUSCLE SURGERY Left   . LEG SURGERY Left    femur fracture with rod  . REVERSE SHOULDER ARTHROPLASTY Right 11/10/2019   Procedure: RIGHT REVERSE SHOULDER ARTHROPLASTY;  Surgeon: Cammy Copa, MD;  Location: [facility] OR;  Service: Orthopedics;  Laterality: Right;  . TONSILLECTOMY      FAMILY HISTORY Family History  Problem Relation Age of Onset  . Hypertension Mother   . Diabetes Brother     SOCIAL HISTORY Social History   Tobacco Use  . Smoking status: Never  Smoker  . Smokeless tobacco: Never Used  Vaping Use  . Vaping Use: Never used  Substance Use Topics  . Alcohol use: Yes    Comment: 1 beer occasionally  . Drug use: No         OPHTHALMIC EXAM:  Base Eye Exam    Visual Acuity (Snellen - Linear)      Right Left   Dist cc 20/30 -2 20/25 +2   Dist ph cc 20/25 +1 20/20   Correction: Glasses       Tonometry (Tonopen, 3:14 PM)      Right Left   Pressure 11 12       Pupils      Dark Light Shape React APD   Right 3 2 Round Brisk None   Left 3 2 Round Brisk None       Visual Fields (Counting fingers)      Left Right    Full Full       Neuro/Psych    Oriented x3: Yes   Mood/Affect: Normal       Dilation    Right eye:         Slit Lamp and Fundus Exam    Slit Lamp Exam      Right Left   Lids/Lashes Dermatochalasis - upper lid, Dermatochalasis - lower lid, mild Meibomian gland dysfunction Dermatochalasis - upper lid, Dermatochalasis - lower lid, mild Meibomian gland dysfunction   Conjunctiva/Sclera blue sclera blue sclera   Cornea arcus arcus   Anterior Chamber deep and clear deep and clear   Iris round and dilated, no NVI round and dilated, no NVI   Lens 2+ NS, 2+CS 2+ NS, 2+CS   Vitreous mild syneresis, PVD mild syneresis       Fundus Exam      Right Left   Disc pink and sharp pink and sharp   C/D Ratio 0.3 0.3   Macula blunted foveal reflex, persistent, focal edema SN macula, IRH and MA - improving peristent CWS and exudate flat, good foveal reflex, mild RPE mottling and clumping, +drusen, No heme or edema   Vessels mild attenuation, +A/V crossing changes, focal BRVO superior macula, mild Tortuousity mild attenuation and tortuosity   Periphery attached, no heme attached, no heme        Refraction    Wearing Rx      Sphere Cylinder Axis Add   Right -2.00 +2.25 111 +2.75   Left -1.50 +1.50 094 2.75          IMAGING AND PROCEDURES  Imaging and Procedures for @TODAY @  OCT, Retina - OU - Both Eyes        Right Eye Quality was good. Central Foveal Thickness:  267. Progression has been stable. Findings include no SRF, intraretinal fluid, retinal drusen , normal foveal contour (Persistent IRF).   Left Eye Quality was good. Central Foveal Thickness: 255. Progression has been stable. Findings include normal foveal contour, no IRF, no SRF, vitreomacular adhesion , retinal drusen .   Notes *Images captured and stored on drive  Diagnosis / Impression:  OD: BRVO with focal CME superonasal macula -- Persistent IRF OS: NFP, no SRF/IRF  Clinical management:  See below  Abbreviations: NFP - Normal foveal profile. CME - cystoid macular edema. PED - pigment epithelial detachment. IRF - intraretinal fluid. SRF - subretinal fluid. EZ - ellipsoid zone. ERM - epiretinal membrane. ORA - outer retinal atrophy. ORT - outer retinal tubulation. SRHM - subretinal hyper-reflective material         Intravitreal Injection, Pharmacologic Agent - OD - Right Eye       Time Out 06/21/2020. 3:41 PM. Confirmed correct patient, procedure, site, and patient consented.   Anesthesia Topical anesthesia was used. Anesthetic medications included Lidocaine 2%, Proparacaine 0.5%.   Procedure Preparation included 5% betadine to ocular surface, eyelid speculum. A (32g) needle was used.   Injection:  2 mg aflibercept Gretta Cool) SOLN   NDC: L6038910, Lot: [id number], Expiration date: 09/15/2020   Route: Intravitreal, Site: Right Eye, Waste: 0.05 mg  Post-op Post injection exam found visual acuity of at least counting fingers. The patient tolerated the procedure well. There were no complications. The patient received written and verbal post procedure care education.                 ASSESSMENT/PLAN:    ICD-10-CM   1. Branch retinal vein occlusion of right eye with macular edema  H34.8310 Intravitreal Injection, Pharmacologic Agent - OD - Right Eye    aflibercept (EYLEA) SOLN 2 mg  2. Retinal edema   H35.81 OCT, Retina - OU - Both Eyes  3. Diabetes mellitus type 2 without retinopathy (HCC)  E11.9   4. Essential hypertension  I10   5. Hypertensive retinopathy of both eyes  H35.033   6. Combined forms of age-related cataract of both eyes  H25.813     1,2. BRVO with CME OD  - s/p IVA OD #1 (01.19.21), #2 (02.16.21), #3 (03.16.21), #4 (4.13.21) -- IVA resistance             - s/p IVE OD #1 (05.11.21--sample), #2 (06.08.21)  - BCVA slightly decreased to 20/25 from 20/20 OD  - exam with focal edema, IRH, CWS superonasal macula -- improving  - OCT shows persistent IRF/edema SN macula  - recommend IVE OD #3 today, 07.06.21  - RBA of procedure discussed, questions answered  - informed consent obtained  - Avastin informed consent form signed and scanned on 01.19.21  - Eylea informed consent form signed and scanned on 05.11.21  - see procedure note  - Eylea4U benefits investigation started, 05.11.21 -- approved through [employer] through 12.31.21  - F/U 4 weeks -- DFE/OCT/possible injection  3. Diabetes mellitus, type 2 without retinopathy OU  - The incidence, risk factors for progression, natural history and treatment options for diabetic retinopathy  were discussed with patient.    - The need for close monitoring of blood glucose, blood pressure, and serum lipids, avoiding cigarette or any type of tobacco, and the need for long term follow up was also discussed with patient.  - f/u in 1 year, sooner prn  4,5. Hypertensive retinopathy OU  - discussed importance of tight BP control  -  monitor  6. Age related cataracts OU   - The symptoms of cataract, surgical options, and treatments and risks were discussed with patient.  - discussed diagnosis and progression  - not yet visually significant  - monitor for now   Ophthalmic Meds Ordered this visit:  Meds ordered this encounter  Medications  . aflibercept (EYLEA) SOLN 2 mg       Return in about 4 weeks (around 07/19/2020) for f/u  BRVO with CME OD, DFE, OCT.  There are no Patient Instructions on file for this visit.   Explained the diagnoses, plan, and follow up with the patient and they expressed understanding.  Patient expressed understanding of the importance of proper follow up care.   This document serves as a record of services personally performed by Karie ChimeraBrian G. Brelee Renk, MD, PhD. It was created on their behalf by Annalee Gentaaryl Barber, COMT. The creation of this record is the provider's dictation and/or activities during the visit.  Electronically signed by: Annalee Gentaaryl Barber, COMT 06/21/20 11:07 PM   This document serves as a record of services personally performed by Karie ChimeraBrian G. Timaya Bojarski, MD, PhD. It was created on their behalf by Glee ArvinAmanda J. Manson PasseyBrown, COT, an ophthalmic technician. The creation of this record is the provider's dictation and/or activities during the visit.    Electronically signed by: Glee ArvinAmanda J. Manson PasseyBrown, [place] 07.06.2021 11:07 PM  Karie ChimeraBrian G. Catrell Morrone, M.D., Ph.D. Diseases & Surgery of the Retina and Vitreous [facility]  I have reviewed the above documentation for accuracy and completeness, and I agree with the above. Karie ChimeraBrian G. Pasqualina Colasurdo, M.D., Ph.D. 06/21/20 11:07 PM   Abbreviations: M myopia (nearsighted); A astigmatism; H hyperopia (farsighted); P presbyopia; Mrx spectacle prescription;  CTL contact lenses; OD right eye; OS left eye; OU both eyes  XT exotropia; ET esotropia; PEK punctate epithelial keratitis; PEE punctate epithelial erosions; DES dry eye syndrome; MGD meibomian gland dysfunction; ATs artificial tears; PFAT's preservative free artificial tears; NSC nuclear sclerotic cataract; PSC posterior subcapsular cataract; ERM epi-retinal membrane; PVD posterior vitreous detachment; RD retinal detachment; DM diabetes mellitus; DR diabetic retinopathy; NPDR non-proliferative diabetic retinopathy; PDR proliferative diabetic retinopathy; CSME clinically significant macular edema; DME diabetic macular edema;  dbh dot blot hemorrhages; CWS cotton wool spot; POAG primary open angle glaucoma; C/D cup-to-disc ratio; HVF humphrey visual field; GVF goldmann visual field; OCT optical coherence tomography; IOP intraocular pressure; BRVO Branch retinal vein occlusion; CRVO central retinal vein occlusion; CRAO central retinal artery occlusion; BRAO branch retinal artery occlusion; RT retinal tear; SB scleral buckle; PPV pars plana vitrectomy; VH Vitreous hemorrhage; PRP panretinal laser photocoagulation; IVK intravitreal kenalog; VMT vitreomacular traction; MH Macular hole;  NVD neovascularization of the disc; NVE neovascularization elsewhere; AREDS age related eye disease study; ARMD age related macular degeneration; POAG primary open angle glaucoma; EBMD epithelial/anterior basement membrane dystrophy; ACIOL anterior chamber intraocular lens; IOL intraocular lens; PCIOL posterior chamber intraocular lens; Phaco/IOL phacoemulsification with intraocular lens placement; PRK photorefractive keratectomy; LASIK laser assisted in situ keratomileusis; HTN hypertension; DM diabetes mellitus; COPD chronic obstructive pulmonary disease

## 2020-06-21 ENCOUNTER — Encounter (INDEPENDENT_AMBULATORY_CARE_PROVIDER_SITE_OTHER): Payer: Self-pay | Admitting: Ophthalmology

## 2020-06-21 ENCOUNTER — Other Ambulatory Visit: Payer: Self-pay

## 2020-06-21 ENCOUNTER — Ambulatory Visit (INDEPENDENT_AMBULATORY_CARE_PROVIDER_SITE_OTHER): Payer: Medicare Other | Admitting: Ophthalmology

## 2020-06-21 DIAGNOSIS — H34831 Tributary (branch) retinal vein occlusion, right eye, with macular edema: Secondary | ICD-10-CM

## 2020-06-21 DIAGNOSIS — H3581 Retinal edema: Secondary | ICD-10-CM | POA: Diagnosis not present

## 2020-06-21 DIAGNOSIS — I10 Essential (primary) hypertension: Secondary | ICD-10-CM

## 2020-06-21 DIAGNOSIS — E119 Type 2 diabetes mellitus without complications: Secondary | ICD-10-CM

## 2020-06-21 DIAGNOSIS — H25813 Combined forms of age-related cataract, bilateral: Secondary | ICD-10-CM

## 2020-06-21 DIAGNOSIS — H35033 Hypertensive retinopathy, bilateral: Secondary | ICD-10-CM

## 2020-06-21 MED ORDER — AFLIBERCEPT 2MG/0.05ML IZ SOLN FOR KALEIDOSCOPE
2.0000 mg | INTRAVITREAL | Status: AC | PRN
  Administered 2020-06-21: 2 mg via INTRAVITREAL

## 2020-07-18 NOTE — Progress Notes (Signed)
[facility] - Clinic Note  07/19/2020     CHIEF COMPLAINT Patient presents for Retina Follow Up   HISTORY OF PRESENT ILLNESS: Kurt [name] is a 69 y.o. male who presents to the clinic today for:   HPI    Retina Follow Up    Patient presents with  CRVO/BRVO.  In right eye.  This started 4 weeks ago.  Severity is moderate.  I, the attending physician,  performed the HPI with the patient and updated documentation appropriately.          Comments    Patient here for 4 week retina follow up for BRVO OD. Patient states vision doing pretty good. No eye pain. They itch sometimes allergies.       Last edited by Rennis Chris, MD on 07/19/2020  5:07 PM. (History)      Referring physician:  Altamease Oiler, FNP [address] [place],  [place] [address]  HISTORICAL INFORMATION:   Selected notes from the MEDICAL RECORD NUMBER Diabetic Eval per Dr. Karleen Hampshire   CURRENT MEDICATIONS: Current Outpatient Medications (Ophthalmic Drugs)  Medication Sig   Olopatadine HCl 0.7 % SOLN Apply to eye.   No current facility-administered medications for this visit. (Ophthalmic Drugs)   Current Outpatient Medications (Other)  Medication Sig   acetaminophen (TYLENOL) 650 MG CR tablet Take 1,300 mg by mouth every 8 (eight) hours as needed for pain.   aspirin 81 MG chewable tablet Chew 1 tablet (81 mg total) by mouth daily.   glipiZIDE (GLUCOTROL XL) 10 MG 24 hr tablet Take 10 mg by mouth at bedtime.    LANTUS SOLOSTAR 100 UNIT/ML Solostar Pen Inject 33 Units into the skin at bedtime.    lisinopril (PRINIVIL,ZESTRIL) 20 MG tablet Take 20 mg by mouth daily.   methocarbamol (ROBAXIN) 500 MG tablet Take 500 mg by mouth at bedtime.    oxyCODONE (OXY IR/ROXICODONE) 5 MG immediate release tablet Take 1 tablet (5 mg total) by mouth every 8 (eight) hours as needed for moderate pain (pain score 4-6).   simvastatin (ZOCOR) 20 MG tablet Take 20 mg by mouth every evening.    SitaGLIPtin-MetFORMIN HCl (JANUMET XR) [phone] MG TB24 Take 1 tablet by mouth at bedtime.   No current facility-administered medications for this visit. (Other)   REVIEW OF SYSTEMS: ROS    Positive for: Gastrointestinal, Musculoskeletal, Endocrine, Eyes   Negative for: Constitutional, Neurological, Skin, Genitourinary, HENT, Cardiovascular, Respiratory, Psychiatric, Allergic/Imm, Heme/Lymph   Last edited by Laddie Aquas, COA on 07/19/2020  1:35 PM. (History)     ALLERGIES Allergies  Allergen Reactions   Augmentin [Amoxicillin-Pot Clavulanate] Nausea And Vomiting   Iodine Hives   Tape Rash    Paper    PAST MEDICAL HISTORY Past Medical History:  Diagnosis Date   Arthritis    Bronchitis    Bronchitis    Cataract    OU   Concussion    late 1990's  after a fall   Diabetes mellitus without complication (HCC)    GERD (gastroesophageal reflux disease)    History of kidney stones    Hypertension    Hypertensive retinopathy    OU   Osteogenesis imperfecta    PONV (postoperative nausea and vomiting)    pt has post polio syndrome   Post-polio syndrome    Past Surgical History:  Procedure Laterality Date   ANKLE FRACTURE SURGERY Bilateral    arm surgery Right    nerve surgery  COLONOSCOPY     ELBOW FRACTURE SURGERY Left    EYE MUSCLE SURGERY Left    LEG SURGERY Left    femur fracture with rod   REVERSE SHOULDER ARTHROPLASTY Right 11/10/2019   Procedure: RIGHT REVERSE SHOULDER ARTHROPLASTY;  Surgeon: Cammy Copa, MD;  Location: [facility] OR;  Service: Orthopedics;  Laterality: Right;   TONSILLECTOMY     FAMILY HISTORY Family History  Problem Relation Age of Onset   Hypertension Mother    Diabetes Brother    SOCIAL HISTORY Social History   Tobacco Use   Smoking status: Never Smoker   Smokeless tobacco: Never Used  Building services engineer Use: Never used  Substance Use Topics   Alcohol use: Yes    Comment: 1 beer occasionally    Drug use: No         OPHTHALMIC EXAM:  Base Eye Exam    Visual Acuity (Snellen - Linear)      Right Left   Dist cc 20/30 -2 20/25   Dist ph cc 20/25 20/20 -2   Correction: Glasses       Tonometry (Tonopen, 1:32 PM)      Right Left   Pressure 12 12       Pupils      Dark Light Shape React APD   Right 3 2 Round Brisk None   Left 3 2 Round Brisk None       Visual Fields (Counting fingers)      Left Right    Full Full       Extraocular Movement      Right Left    Full, Ortho Full, Ortho       Neuro/Psych    Oriented x3: Yes   Mood/Affect: Normal       Dilation    Both eyes: 1.0% Mydriacyl, 2.5% Phenylephrine @ 1:32 PM        Slit Lamp and Fundus Exam    Slit Lamp Exam      Right Left   Lids/Lashes Dermatochalasis - upper lid, Dermatochalasis - lower lid, mild Meibomian gland dysfunction Dermatochalasis - upper lid, Dermatochalasis - lower lid, mild Meibomian gland dysfunction   Conjunctiva/Sclera blue sclera blue sclera   Cornea arcus arcus   Anterior Chamber deep and clear deep and clear   Iris round and dilated, no NVI round and dilated, no NVI   Lens 2+ NS, 2+CS 2+ NS, 2+CS   Vitreous mild syneresis, PVD mild syneresis       Fundus Exam      Right Left   Disc pink and sharp pink and sharp   C/D Ratio 0.3 0.3   Macula blunted foveal reflex, persistent, focal edema SN macula, IRH and MA - improving, peristent CWS and exudate flat, good foveal reflex, mild RPE mottling and clumping, +drusen, No heme or edema   Vessels mild attenuation, +A/V crossing changes, focal BRVO superior macula, mild Tortuousity mild attenuation and tortuosity   Periphery attached, no heme attached, no heme        Refraction    Wearing Rx      Sphere Cylinder Axis Add   Right -2.00 +2.25 111 +2.75   Left -1.50 +1.50 094 2.75         IMAGING AND PROCEDURES  Imaging and Procedures for @TODAY @  OCT, Retina - OU - Both Eyes       Right Eye Quality was good. Central  Foveal Thickness: 269. Progression has improved. Findings include no  SRF, intraretinal fluid, retinal drusen , normal foveal contour (Mild interval improvement in IRF).   Left Eye Quality was good. Central Foveal Thickness: 260. Progression has been stable. Findings include normal foveal contour, no IRF, no SRF, vitreomacular adhesion , retinal drusen .   Notes *Images captured and stored on drive  Diagnosis / Impression:  OD: BRVO with Mild interval improvement in IRF OS: NFP, no SRF/IRF  Clinical management:  See below  Abbreviations: NFP - Normal foveal profile. CME - cystoid macular edema. PED - pigment epithelial detachment. IRF - intraretinal fluid. SRF - subretinal fluid. EZ - ellipsoid zone. ERM - epiretinal membrane. ORA - outer retinal atrophy. ORT - outer retinal tubulation. SRHM - subretinal hyper-reflective material         Intravitreal Injection, Pharmacologic Agent - OD - Right Eye       Time Out 07/19/2020. 1:39 PM. Confirmed correct patient, procedure, site, and patient consented.   Anesthesia Topical anesthesia was used. Anesthetic medications included Lidocaine 2%, Proparacaine 0.5%.   Procedure Preparation included 5% betadine to ocular surface, eyelid speculum. A (32g) needle was used.   Injection:  2 mg aflibercept Gretta Cool(EYLEA) SOLN   NDC: [id number], Lot: [id number] [id number], Expiration date: 10/16/2020   Route: Intravitreal, Site: Right Eye, Waste: 0.05 mL  Post-op Post injection exam found visual acuity of at least counting fingers. The patient tolerated the procedure well. There were no complications. The patient received written and verbal post procedure care education. Post injection medications were not given.                 ASSESSMENT/PLAN:    ICD-10-CM   1. Branch retinal vein occlusion of right eye with macular edema  H34.8310 Intravitreal Injection, Pharmacologic Agent - OD - Right Eye    aflibercept (EYLEA) SOLN 2 mg  2. Retinal edema   H35.81 OCT, Retina - OU - Both Eyes  3. Diabetes mellitus type 2 without retinopathy (HCC)  E11.9   4. Essential hypertension  I10   5. Hypertensive retinopathy of both eyes  H35.033   6. Combined forms of age-related cataract of both eyes  H25.813     1,2. BRVO with CME OD  - s/p IVA OD #1 (01.19.21), #2 (02.16.21), #3 (03.16.21), #4 (4.13.21) -- IVA resistance             - s/p IVE OD #1 (05.11.21--sample), #2 (06.08.21), #3 (07.06.21)  - BCVA stable at 20/25 OD  - exam with focal edema, IRH, CWS superonasal macula -- improving  - OCT shows mild interval improvement in IRF/edema SN macula  - recommend IVE OD #4 today, 08.03.21  - RBA of procedure discussed, questions answered  - informed consent obtained  - Avastin informed consent form signed and scanned on 01.19.21  - Eylea informed consent form signed and scanned on 05.11.21  - see procedure note  - Eylea4U benefits investigation started, 05.11.21 -- approved through [employer] through 12.31.21  - F/U 4 weeks -- DFE/OCT/possible injection  3. Diabetes mellitus, type 2 without retinopathy OU  - The incidence, risk factors for progression, natural history and treatment options for diabetic retinopathy  were discussed with patient.    - The need for close monitoring of blood glucose, blood pressure, and serum lipids, avoiding cigarette or any type of tobacco, and the need for long term follow up was also discussed with patient.  - f/u in 1 year, sooner prn  4,5. Hypertensive retinopathy OU  - discussed importance of tight BP  control  - monitor  6. Age related cataracts OU   - The symptoms of cataract, surgical options, and treatments and risks were discussed with patient.  - discussed diagnosis and progression  - not yet visually significant  - monitor for now  Ophthalmic Meds Ordered this visit:  Meds ordered this encounter  Medications   aflibercept (EYLEA) SOLN 2 mg      Return in about 4 weeks (around 08/16/2020) for  f/u BRVO OD, DFE, OCT.  There are no Patient Instructions on file for this visit.   Explained the diagnoses, plan, and follow up with the patient and they expressed understanding.  Patient expressed understanding of the importance of proper follow up care.   This document serves as a record of services personally performed by Karie Chimera, MD, PhD. It was created on their behalf by Cristopher Estimable, COT an ophthalmic technician. The creation of this record is the provider's dictation and/or activities during the visit.    Electronically signed by: Cristopher Estimable, COT 8.2.21 @ 5:08 PM   This document serves as a record of services personally performed by Karie Chimera, MD, PhD. It was created on their behalf by Glee Arvin. Manson Passey, OA an ophthalmic technician. The creation of this record is the provider's dictation and/or activities during the visit.    Electronically signed by: Glee Arvin. Manson Passey, [place] 08.03.2021 5:08 PM  Karie Chimera, M.D., Ph.D. Diseases & Surgery of the Retina and Vitreous [facility] 07/19/2020   I have reviewed the above documentation for accuracy and completeness, and I agree with the above. Karie Chimera, M.D., Ph.D. 07/19/20 5:08 PM   Abbreviations: M myopia (nearsighted); A astigmatism; H hyperopia (farsighted); P presbyopia; Mrx spectacle prescription;  CTL contact lenses; OD right eye; OS left eye; OU both eyes  XT exotropia; ET esotropia; PEK punctate epithelial keratitis; PEE punctate epithelial erosions; DES dry eye syndrome; MGD meibomian gland dysfunction; ATs artificial tears; PFAT's preservative free artificial tears; NSC nuclear sclerotic cataract; PSC posterior subcapsular cataract; ERM epi-retinal membrane; PVD posterior vitreous detachment; RD retinal detachment; DM diabetes mellitus; DR diabetic retinopathy; NPDR non-proliferative diabetic retinopathy; PDR proliferative diabetic retinopathy; CSME clinically significant macular edema;  DME diabetic macular edema; dbh dot blot hemorrhages; CWS cotton wool spot; POAG primary open angle glaucoma; C/D cup-to-disc ratio; HVF humphrey visual field; GVF goldmann visual field; OCT optical coherence tomography; IOP intraocular pressure; BRVO Branch retinal vein occlusion; CRVO central retinal vein occlusion; CRAO central retinal artery occlusion; BRAO branch retinal artery occlusion; RT retinal tear; SB scleral buckle; PPV pars plana vitrectomy; VH Vitreous hemorrhage; PRP panretinal laser photocoagulation; IVK intravitreal kenalog; VMT vitreomacular traction; MH Macular hole;  NVD neovascularization of the disc; NVE neovascularization elsewhere; AREDS age related eye disease study; ARMD age related macular degeneration; POAG primary open angle glaucoma; EBMD epithelial/anterior basement membrane dystrophy; ACIOL anterior chamber intraocular lens; IOL intraocular lens; PCIOL posterior chamber intraocular lens; Phaco/IOL phacoemulsification with intraocular lens placement; PRK photorefractive keratectomy; LASIK laser assisted in situ keratomileusis; HTN hypertension; DM diabetes mellitus; COPD chronic obstructive pulmonary disease

## 2020-07-19 ENCOUNTER — Encounter (INDEPENDENT_AMBULATORY_CARE_PROVIDER_SITE_OTHER): Payer: Self-pay | Admitting: Ophthalmology

## 2020-07-19 ENCOUNTER — Ambulatory Visit (INDEPENDENT_AMBULATORY_CARE_PROVIDER_SITE_OTHER): Payer: Medicare Other | Admitting: Ophthalmology

## 2020-07-19 ENCOUNTER — Other Ambulatory Visit: Payer: Self-pay

## 2020-07-19 DIAGNOSIS — I10 Essential (primary) hypertension: Secondary | ICD-10-CM | POA: Diagnosis not present

## 2020-07-19 DIAGNOSIS — H25813 Combined forms of age-related cataract, bilateral: Secondary | ICD-10-CM

## 2020-07-19 DIAGNOSIS — E119 Type 2 diabetes mellitus without complications: Secondary | ICD-10-CM | POA: Diagnosis not present

## 2020-07-19 DIAGNOSIS — H3581 Retinal edema: Secondary | ICD-10-CM

## 2020-07-19 DIAGNOSIS — H34831 Tributary (branch) retinal vein occlusion, right eye, with macular edema: Secondary | ICD-10-CM | POA: Diagnosis not present

## 2020-07-19 DIAGNOSIS — H35033 Hypertensive retinopathy, bilateral: Secondary | ICD-10-CM

## 2020-07-19 MED ORDER — AFLIBERCEPT 2MG/0.05ML IZ SOLN FOR KALEIDOSCOPE
2.0000 mg | INTRAVITREAL | Status: AC | PRN
  Administered 2020-07-19: 2 mg via INTRAVITREAL

## 2020-08-12 NOTE — Progress Notes (Signed)
[facility] - Clinic Note  08/16/2020     CHIEF COMPLAINT Patient presents for Retina Follow Up   HISTORY OF PRESENT ILLNESS: Kurt [name] is a 69 y.o. male who presents to the clinic today for:   HPI    Retina Follow Up    Patient presents with  CRVO/BRVO.  In right eye.  This started 4 weeks ago.  Severity is moderate.          Comments    Patient here for 4 weeks retina follow up for BRVO OD. Patient states vision doing pretty good. No eye pain. Itches on occasion. Had blisters on legs- cellulitis put on clindamycin 300 mg X 2 a day.       Last edited by Laddie Aquas, COA on 08/16/2020  2:17 PM. (History)    pt states no change in vision, he has developed cellulitis on his legs  Referring physician:  Aura Camps, MD [address] [place],  [place] [address]  HISTORICAL INFORMATION:   Selected notes from the MEDICAL RECORD NUMBER Diabetic Eval per Dr. Karleen Hampshire   CURRENT MEDICATIONS: Current Outpatient Medications (Ophthalmic Drugs)  Medication Sig  . Olopatadine HCl 0.7 % SOLN Apply to eye.   No current facility-administered medications for this visit. (Ophthalmic Drugs)   Current Outpatient Medications (Other)  Medication Sig  . acetaminophen (TYLENOL) 650 MG CR tablet Take 1,300 mg by mouth every 8 (eight) hours as needed for pain.  Marland Kitchen aspirin 81 MG chewable tablet Chew 1 tablet (81 mg total) by mouth daily.  Marland Kitchen glipiZIDE (GLUCOTROL XL) 10 MG 24 hr tablet Take 10 mg by mouth at bedtime.   Marland Kitchen LANTUS SOLOSTAR 100 UNIT/ML Solostar Pen Inject 33 Units into the skin at bedtime.   Marland Kitchen lisinopril (PRINIVIL,ZESTRIL) 20 MG tablet Take 20 mg by mouth daily.  . methocarbamol (ROBAXIN) 500 MG tablet Take 500 mg by mouth at bedtime.   Marland Kitchen oxyCODONE (OXY IR/ROXICODONE) 5 MG immediate release tablet Take 1 tablet (5 mg total) by mouth every 8 (eight) hours as needed for moderate pain (pain score 4-6).  Marland Kitchen simvastatin (ZOCOR) 20 MG tablet  Take 20 mg by mouth every evening.  . SitaGLIPtin-MetFORMIN HCl (JANUMET XR) [phone] MG TB24 Take 1 tablet by mouth at bedtime.   No current facility-administered medications for this visit. (Other)   REVIEW OF SYSTEMS: ROS    Positive for: Gastrointestinal, Musculoskeletal, Endocrine, Eyes   Negative for: Constitutional, Neurological, Skin, Genitourinary, HENT, Cardiovascular, Respiratory, Psychiatric, Allergic/Imm, Heme/Lymph   Last edited by Laddie Aquas, COA on 08/16/2020  2:17 PM. (History)     ALLERGIES Allergies  Allergen Reactions  . Augmentin [Amoxicillin-Pot Clavulanate] Nausea And Vomiting  . Iodine Hives  . Tape Rash    Paper    PAST MEDICAL HISTORY Past Medical History:  Diagnosis Date  . Arthritis   . Bronchitis   . Bronchitis   . Cataract    OU  . Concussion    late 1990's  after a fall  . Diabetes mellitus without complication (HCC)   . GERD (gastroesophageal reflux disease)   . History of kidney stones   . Hypertension   . Hypertensive retinopathy    OU  . Osteogenesis imperfecta   . PONV (postoperative nausea and vomiting)    pt has post polio syndrome  . Post-polio syndrome    Past Surgical History:  Procedure Laterality Date  . ANKLE FRACTURE SURGERY Bilateral   .  arm surgery Right    nerve surgery  . COLONOSCOPY    . ELBOW FRACTURE SURGERY Left   . EYE MUSCLE SURGERY Left   . LEG SURGERY Left    femur fracture with rod  . REVERSE SHOULDER ARTHROPLASTY Right 11/10/2019   Procedure: RIGHT REVERSE SHOULDER ARTHROPLASTY;  Surgeon: Cammy Copa, MD;  [facility];  Service: Orthopedics;  Laterality: Right;  . TONSILLECTOMY     FAMILY HISTORY Family History  Problem Relation Age of Onset  . Hypertension Mother   . Diabetes Brother    SOCIAL HISTORY Social History   Tobacco Use  . Smoking status: Never Smoker  . Smokeless tobacco: Never Used  Vaping Use  . Vaping Use: Never used  Substance Use Topics  . Alcohol use:  Yes    Comment: 1 beer occasionally  . Drug use: No         OPHTHALMIC EXAM:  Base Eye Exam    Visual Acuity (Snellen - Linear)      Right Left   Dist cc 20/40 20/20 -1   Dist ph cc 20/25 -2    Correction: Glasses       Tonometry (Tonopen, 2:14 PM)      Right Left   Pressure 12 14       Pupils      Dark Light Shape React APD   Right 3 2 Round Brisk None   Left 3 2 Round Brisk None       Visual Fields (Counting fingers)      Left Right    Full Full       Extraocular Movement      Right Left    Full, Ortho Full, Ortho       Neuro/Psych    Oriented x3: Yes   Mood/Affect: Normal       Dilation    Both eyes: 1.0% Mydriacyl, 2.5% Phenylephrine @ 2:14 PM        Slit Lamp and Fundus Exam    Slit Lamp Exam      Right Left   Lids/Lashes Dermatochalasis - upper lid, Dermatochalasis - lower lid, mild Meibomian gland dysfunction Dermatochalasis - upper lid, Dermatochalasis - lower lid, mild Meibomian gland dysfunction   Conjunctiva/Sclera blue sclera blue sclera   Cornea arcus arcus   Anterior Chamber deep and clear deep and clear   Iris round and dilated, no NVI round and dilated, no NVI   Lens 2+ NS, 2+CS 2+ NS, 2+CS   Vitreous mild syneresis, PVD mild syneresis       Fundus Exam      Right Left   Disc pink and sharp pink and sharp   C/D Ratio 0.3 0.3   Macula blunted foveal reflex, persistent, focal edema SN macula, IRH and MA - improving, persistent CWS and exudate -- improved flat, good foveal reflex, mild RPE mottling and clumping, +drusen, No heme or edema   Vessels mild attenuation, +A/V crossing changes, focal BRVO superior macula, mild Tortuousity mild attenuation and tortuosity   Periphery attached, no heme attached, no heme        Refraction    Wearing Rx      Sphere Cylinder Axis Add   Right -2.00 +2.25 111 +2.75   Left -1.50 +1.50 094 2.75         IMAGING AND PROCEDURES  Imaging and Procedures for @TODAY @  OCT, Retina - OU - Both  Eyes       Right Eye Quality  was good. Central Foveal Thickness: 261. Progression has improved. Findings include no SRF, intraretinal fluid, retinal drusen , normal foveal contour (Mild interval improvement in SN edema/IRF).   Left Eye Quality was good. Central Foveal Thickness: 256. Progression has been stable. Findings include normal foveal contour, no IRF, no SRF, vitreomacular adhesion , retinal drusen .   Notes *Images captured and stored on drive  Diagnosis / Impression:  OD: BRVO with Mild interval improvement in SN edema/IRF OS: NFP, no SRF/IRF  Clinical management:  See below  Abbreviations: NFP - Normal foveal profile. CME - cystoid macular edema. PED - pigment epithelial detachment. IRF - intraretinal fluid. SRF - subretinal fluid. EZ - ellipsoid zone. ERM - epiretinal membrane. ORA - outer retinal atrophy. ORT - outer retinal tubulation. SRHM - subretinal hyper-reflective material         Intravitreal Injection, Pharmacologic Agent - OD - Right Eye       Time Out 08/16/2020. 3:01 PM. Confirmed correct patient, procedure, site, and patient consented.   Anesthesia Topical anesthesia was used. Anesthetic medications included Lidocaine 2%, Proparacaine 0.5%.   Procedure Preparation included 5% betadine to ocular surface, eyelid speculum. A (32g) needle was used.   Injection:  2 mg aflibercept Gretta Cool(EYLEA) SOLN   NDC: [id number], Lot: [id number], Expiration date: 10/17/2020   Route: Intravitreal, Site: Right Eye, Waste: 0.05 mL  Post-op Post injection exam found visual acuity of at least counting fingers. The patient tolerated the procedure well. There were no complications. The patient received written and verbal post procedure care education. Post injection medications were not given.                 ASSESSMENT/PLAN:    ICD-10-CM   1. Branch retinal vein occlusion of right eye with macular edema  H34.8310 Intravitreal Injection, Pharmacologic Agent - OD -  Right Eye    aflibercept (EYLEA) SOLN 2 mg  2. Retinal edema  H35.81 OCT, Retina - OU - Both Eyes  3. Diabetes mellitus type 2 without retinopathy (HCC)  E11.9   4. Essential hypertension  I10   5. Hypertensive retinopathy of both eyes  H35.033   6. Combined forms of age-related cataract of both eyes  H25.813     1,2. BRVO with CME OD  - s/p IVA OD #1 (01.19.21), #2 (02.16.21), #3 (03.16.21), #4 (4.13.21) -- IVA resistance             - s/p IVE OD #1 (05.11.21--sample), #2 (06.08.21), #3 (07.06.21), #4 (8.3.21)  - BCVA stable at 20/25 OD  - exam with focal edema, IRH, CWS superonasal macula -- improving  - OCT shows mild interval improvement in IRF/edema SN macula -- non-central  - recommend IVE OD #5 today, (08.31.21)  - RBA of procedure discussed, questions answered  - informed consent obtained  - Avastin informed consent form signed and scanned on 01.19.21  - Eylea informed consent form signed and scanned on 05.11.21  - see procedure note  - Eylea4U benefits investigation started, 05.11.21 -- approved through [employer] through 12.31.21  - F/U 4 weeks -- DFE/OCT/possible injection  3. Diabetes mellitus, type 2 without retinopathy OU  - The incidence, risk factors for progression, natural history and treatment options for diabetic retinopathy  were discussed with patient.    - The need for close monitoring of blood glucose, blood pressure, and serum lipids, avoiding cigarette or any type of tobacco, and the need for long term follow up was also discussed with patient.  -  f/u in 1 year, sooner prn  4,5. Hypertensive retinopathy OU  - discussed importance of tight BP control  - monitor  6. Age related cataracts OU   - The symptoms of cataract, surgical options, and treatments and risks were discussed with patient.  - discussed diagnosis and progression  - not yet visually significant  - monitor for now  Ophthalmic Meds Ordered this visit:  Meds ordered this encounter   Medications  . aflibercept (EYLEA) SOLN 2 mg      Return in about 4 weeks (around 09/13/2020) for f/u BRVO OD, DFE, OCT.  There are no Patient Instructions on file for this visit.   Explained the diagnoses, plan, and follow up with the patient and they expressed understanding.  Patient expressed understanding of the importance of proper follow up care.   This document serves as a record of services personally performed by Karie Chimera, MD, PhD. It was created on their behalf by Cristopher Estimable, COT an ophthalmic technician. The creation of this record is the provider's dictation and/or activities during the visit.    Electronically signed by: Cristopher Estimable, COT 8.27.21 @ 3:37 PM   This document serves as a record of services personally performed by Karie Chimera, MD, PhD. It was created on their behalf by Glee Arvin. Manson Passey, OA an ophthalmic technician. The creation of this record is the provider's dictation and/or activities during the visit.    Electronically signed by: Glee Arvin. Manson Passey, [place] 08.31.2021 3:37 PM  Karie Chimera, M.D., Ph.D. Diseases & Surgery of the Retina and Vitreous [facility] 08/16/2020   I have reviewed the above documentation for accuracy and completeness, and I agree with the above. Karie Chimera, M.D., Ph.D. 08/16/20 3:37 PM   Abbreviations: M myopia (nearsighted); A astigmatism; H hyperopia (farsighted); P presbyopia; Mrx spectacle prescription;  CTL contact lenses; OD right eye; OS left eye; OU both eyes  XT exotropia; ET esotropia; PEK punctate epithelial keratitis; PEE punctate epithelial erosions; DES dry eye syndrome; MGD meibomian gland dysfunction; ATs artificial tears; PFAT's preservative free artificial tears; NSC nuclear sclerotic cataract; PSC posterior subcapsular cataract; ERM epi-retinal membrane; PVD posterior vitreous detachment; RD retinal detachment; DM diabetes mellitus; DR diabetic retinopathy; NPDR non-proliferative  diabetic retinopathy; PDR proliferative diabetic retinopathy; CSME clinically significant macular edema; DME diabetic macular edema; dbh dot blot hemorrhages; CWS cotton wool spot; POAG primary open angle glaucoma; C/D cup-to-disc ratio; HVF humphrey visual field; GVF goldmann visual field; OCT optical coherence tomography; IOP intraocular pressure; BRVO Branch retinal vein occlusion; CRVO central retinal vein occlusion; CRAO central retinal artery occlusion; BRAO branch retinal artery occlusion; RT retinal tear; SB scleral buckle; PPV pars plana vitrectomy; VH Vitreous hemorrhage; PRP panretinal laser photocoagulation; IVK intravitreal kenalog; VMT vitreomacular traction; MH Macular hole;  NVD neovascularization of the disc; NVE neovascularization elsewhere; AREDS age related eye disease study; ARMD age related macular degeneration; POAG primary open angle glaucoma; EBMD epithelial/anterior basement membrane dystrophy; ACIOL anterior chamber intraocular lens; IOL intraocular lens; PCIOL posterior chamber intraocular lens; Phaco/IOL phacoemulsification with intraocular lens placement; PRK photorefractive keratectomy; LASIK laser assisted in situ keratomileusis; HTN hypertension; DM diabetes mellitus; COPD chronic obstructive pulmonary disease

## 2020-08-15 ENCOUNTER — Ambulatory Visit (HOSPITAL_COMMUNITY): Payer: Medicare Other | Attending: General Practice | Admitting: Physical Therapy

## 2020-08-15 ENCOUNTER — Encounter (HOSPITAL_COMMUNITY): Payer: Self-pay | Admitting: Physical Therapy

## 2020-08-15 ENCOUNTER — Other Ambulatory Visit: Payer: Self-pay

## 2020-08-15 DIAGNOSIS — R600 Localized edema: Secondary | ICD-10-CM | POA: Diagnosis present

## 2020-08-15 DIAGNOSIS — S81801A Unspecified open wound, right lower leg, initial encounter: Secondary | ICD-10-CM | POA: Diagnosis present

## 2020-08-15 DIAGNOSIS — S81802A Unspecified open wound, left lower leg, initial encounter: Secondary | ICD-10-CM | POA: Diagnosis present

## 2020-08-15 NOTE — Therapy (Addendum)
[facility] [address] [place], [place], [address] Phone: [phone]   Fax:  [phone]  Wound Care Evaluation  Patient Details  Name: Kurt [name] MRN: [id number] Date of Birth: [birth year] MD Kurt DuffBeth [name]  Encounter Date: 08/15/2020   PT End of Session - 08/15/20 1723    Visit Number 1    Number of Visits 8    Date for PT Re-Evaluation 09/14/20    Authorization Type [employer] medicare    Progress Note Due on Visit 8    PT Start Time 1620    PT Stop Time 1705    PT Time Calculation (min) 45 min    Activity Tolerance Patient tolerated treatment well    Behavior During Therapy [facility] for tasks assessed/performed           Past Medical History:  Diagnosis Date  . Arthritis   . Bronchitis   . Bronchitis   . Cataract    OU  . Concussion    late 1990's  after a fall  . Diabetes mellitus without complication (HCC)   . GERD (gastroesophageal reflux disease)   . History of kidney stones   . Hypertension   . Hypertensive retinopathy    OU  . Osteogenesis imperfecta   . PONV (postoperative nausea and vomiting)    pt has post polio syndrome  . Post-polio syndrome     Past Surgical History:  Procedure Laterality Date  . ANKLE FRACTURE SURGERY Bilateral   . arm surgery Right    nerve surgery  . COLONOSCOPY    . ELBOW FRACTURE SURGERY Left   . EYE MUSCLE SURGERY Left   . LEG SURGERY Left    femur fracture with rod  . REVERSE SHOULDER ARTHROPLASTY Right 11/10/2019   Procedure: RIGHT REVERSE SHOULDER ARTHROPLASTY;  Surgeon: Kurt Copaean, Kurt Scott, MD;  [facility];  Service: Orthopedics;  Laterality: Right;  . TONSILLECTOMY      There were no vitals filed for this visit.      Wound Therapy - 08/15/20 0001    Subjective Pt states that he started to have blisters on both legs about six weeks ago.  He started putting triple antibiotic but it did not help; he went to his MD who placed him on an atibiotic which he has been  on for the past three weeks.     Patient and Family Stated Goals wounds to heal     Date of Onset 07/06/20    Prior Treatments MD    Pain Scale 0-10    Pain Score 3    when legs are up pain is worse    Evaluation and Treatment Procedures Explained to Patient/Family Yes    Evaluation and Treatment Procedures agreed to    Wound Properties Date First Assessed: 08/15/20 Time First Assessed: 1635 Wound Type: Other (Comment) Location: Leg Location Orientation: Left;Anterior Wound Description (Comments): 11 scattered wounds Largest ant inferior 3.5x3.5; largest post 4x2 Present on Admission: Yes   Dressing Type None    Dressing Changed New    Dressing Status None    Site / Wound Assessment Dusky   all    % Wound base Red or Granulating 0%    % Wound base Yellow/Fibrinous Exudate 100%    Peri-wound Assessment Edema;Erythema (blanchable)    Drainage Amount Minimal    Drainage Description Serous    Treatment Cleansed;Debridement (Selective)    Selective Debridement - Location wounds    Selective Debridement - Tools  Used Forceps    Selective Debridement - Tissue Removed devitalized tissue and hair     Wound Therapy - Clinical Statement Mr. Kurt [name] is a 69 yo male who has had blisters appear on his legs in the past.  This time, however, he can not get them healed despite being on antibiotic therefore he was referred to skilled PT for evaluation and treatment.  The pt LE are red with scattered wounds all wounds are dry and there is slight edema however the LE have no hair, feel more comfortable being down indicative of aterial insuffiencency.  Mr. Kurt [name] will benefit from skilled PT to create a healing enviornment for his wounds and decrease his riisk for cellulitie.     Wound Therapy - Functional Problem List walking     Factors Delaying/Impairing Wound Healing Altered sensation;Diabetes Mellitus;Infection - systemic/local;Immobility;Multiple medical problems;Vascular compromise    Hydrotherapy  Plan Debridement;Dressing change;Patient/family education    Wound Therapy - Frequency 2X / week   x 4 weeks    Wound Therapy - Current Recommendations PT    Wound Plan PT to be seen 2x sk for 4 wk for cleansing, debridement and dressing change to B LE     Dressing  xeroform to wounds,f/b 4x4, kerlix and coban with netting to keep dressing in place.             PT has 8 scattered wounds on the Right with largest wounds on posterior side.  Wounds debrided and dressed with xeroform, 4x4, kerlix and coban as well.      Objective measurements completed on examination: See above findings.               PT Short Term Goals - 08/15/20 1726      PT SHORT TERM GOAL #1   Title Pt wounds to be 100% granulated to decrease risk of cellulitis.    Time 2    Period Weeks    Status New    Target Date 08/29/20      PT SHORT TERM GOAL #2   Title Pt to verbalize the importance of cleansing and moisturizing LE on a daily basis.    Time 2    Period Weeks    Status New      PT SHORT TERM GOAL #3   Title Pt to no longer have increased redness in LE to demonstrate abscense of cellulitis    Time 2    Period Weeks    Status New             PT Long Term Goals - 08/15/20 1726      PT LONG TERM GOAL #1   Title All wounds to be healed to allow pt to be able to don socks and pants with increased comfort.    Time 4    Period Weeks    Status New    Target Date 09/12/20      PT LONG TERM GOAL #2   Title Pt to understand and verbalize that wearing compression garments will assist in preventing LE edema and furture wounds.    Time 4    Period Weeks    Status New           Personal Factors and Comorbidities                     Time since onset,past/current experience,  Co Morbidities  DM, B ankle fx, circulation Limitations                                                               Hygiene/Grooming, dressing,  locomotion Examination-Participation                                       Yard Work; Cleansing Pt will benefit from skilled therapeutic                     Pain; decreased skin integrity, increased edema Intervention for: Stability                                                                    Moderate complexity REhab Potential                                                      Good Pt frequency                                                            2x/ week  PT treatment/interventions                                      ADL, self care, debridement and dressing Next visit                                                                  As above       Patient will benefit from skilled therapeutic intervention in order to improve the following deficits and impairments:  Pain, Decreased skin integrity, Increased edema  Visit Diagnosis: Localized edema - Plan: PT plan of care cert/re-cert  Multiple open wounds of right lower leg - Plan: PT plan of care cert/re-cert  Multiple open wounds of left lower leg - Plan: PT plan of care cert/re-cert    Problem List Patient Active Problem List   Diagnosis Date Noted  . 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 Cellulitis 02/24/2019  . 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 Osteogenesis imperfecta 12/25/2018  . Diabetes mellitus without complication [facility]) 12/25/2018   Virgina Organ, PT CLT [phone] 08/17/2020, 5:15 PM  [facility] [address] [place], [place], [address] Phone: [phone]   Fax:  [phone]  Name: Kurt  JYLES [name] MRN: [id number] Date of Birth: 02/21/1951

## 2020-08-16 ENCOUNTER — Encounter (INDEPENDENT_AMBULATORY_CARE_PROVIDER_SITE_OTHER): Payer: Self-pay | Admitting: Ophthalmology

## 2020-08-16 ENCOUNTER — Ambulatory Visit (INDEPENDENT_AMBULATORY_CARE_PROVIDER_SITE_OTHER): Payer: Medicare Other | Admitting: Ophthalmology

## 2020-08-16 DIAGNOSIS — I10 Essential (primary) hypertension: Secondary | ICD-10-CM

## 2020-08-16 DIAGNOSIS — H34831 Tributary (branch) retinal vein occlusion, right eye, with macular edema: Secondary | ICD-10-CM | POA: Diagnosis not present

## 2020-08-16 DIAGNOSIS — E119 Type 2 diabetes mellitus without complications: Secondary | ICD-10-CM

## 2020-08-16 DIAGNOSIS — H3581 Retinal edema: Secondary | ICD-10-CM

## 2020-08-16 DIAGNOSIS — H35033 Hypertensive retinopathy, bilateral: Secondary | ICD-10-CM

## 2020-08-16 DIAGNOSIS — H25813 Combined forms of age-related cataract, bilateral: Secondary | ICD-10-CM

## 2020-08-16 MED ORDER — AFLIBERCEPT 2MG/0.05ML IZ SOLN FOR KALEIDOSCOPE
2.0000 mg | INTRAVITREAL | Status: AC | PRN
  Administered 2020-08-16: 2 mg via INTRAVITREAL

## 2020-08-17 ENCOUNTER — Ambulatory Visit (HOSPITAL_COMMUNITY): Payer: Medicare Other | Attending: General Practice | Admitting: Physical Therapy

## 2020-08-17 ENCOUNTER — Encounter (HOSPITAL_COMMUNITY): Payer: Self-pay | Admitting: Physical Therapy

## 2020-08-17 ENCOUNTER — Other Ambulatory Visit: Payer: Self-pay

## 2020-08-17 DIAGNOSIS — S81801A Unspecified open wound, right lower leg, initial encounter: Secondary | ICD-10-CM | POA: Insufficient documentation

## 2020-08-17 DIAGNOSIS — S81802A Unspecified open wound, left lower leg, initial encounter: Secondary | ICD-10-CM

## 2020-08-17 DIAGNOSIS — R600 Localized edema: Secondary | ICD-10-CM

## 2020-08-17 NOTE — Therapy (Addendum)
[place], [place], [address] Phone: [phone]   Fax:  [phone]  Wound Care Therapy  Patient Details  Name: Kurt [name] MRN: [id number] Date of Birth: June 10, 1951 No data recorded  Encounter Date: 08/17/2020   PT End of Session - 08/17/20 1719    Visit Number 2    Number of Visits 8    Date for PT Re-Evaluation 09/14/20    Authorization Type [employer] medicare    Progress Note Due on Visit 8    PT Start Time 1615    PT Stop Time 1705    PT Time Calculation (min) 50 min    Activity Tolerance Patient tolerated treatment well    Behavior During Therapy [facility] for tasks assessed/performed           Past Medical History:  Diagnosis Date  . Arthritis   . Bronchitis   . Bronchitis   . Cataract    OU  . Concussion    late 1990's  after a fall  . Diabetes mellitus without complication ([place])   . GERD (gastroesophageal reflux disease)   . History of kidney stones   . Hypertension   . Hypertensive retinopathy    OU  . Osteogenesis imperfecta   . PONV (postoperative nausea and vomiting)    pt has post polio syndrome  . Post-polio syndrome     Past Surgical History:  Procedure Laterality Date  . ANKLE FRACTURE SURGERY Bilateral   . arm surgery Right    nerve surgery  . COLONOSCOPY    . ELBOW FRACTURE SURGERY Left   . EYE MUSCLE SURGERY Left   . LEG SURGERY Left    femur fracture with rod  . REVERSE SHOULDER ARTHROPLASTY Right 11/10/2019   Procedure: RIGHT REVERSE SHOULDER ARTHROPLASTY;  Surgeon: Meredith Pel, MD;  Location: [place];  Service: Orthopedics;  Laterality: Right;  . TONSILLECTOMY      There were no vitals filed for this visit.               Wound Therapy - 08/17/20 0001    Subjective Pt states that he feels much better.  He is able to sleep better at night.     Patient and Family Stated Goals wounds to heal     Date of Onset 07/06/20    Prior Treatments MD     Evaluation and Treatment Procedures Explained to Patient/Family Yes    Evaluation and Treatment Procedures agreed to    Wound Properties Date First Assessed: 08/15/20 Time First Assessed: 1635 Wound Type: Other (Comment) Location: Leg Location Orientation: Left;Anterior Wound Description (Comments): 11 scattered wounds Largest ant inferior 3.5x3.5; largest post 4x2 Present on Admission: Yes   Dressing Type Compression wrap    Dressing Changed Changed    Dressing Status Dry;Old drainage    Site / Wound Assessment Dusky    % Wound base Red or Granulating 0%    % Wound base Yellow/Fibrinous Exudate 100%    Peri-wound Assessment Edema;Erythema (blanchable)    Drainage Amount Scant    Drainage Description Serous    Treatment Cleansed;Debridement (Selective)    Wound Properties Date First Assessed: 08/15/20 Time First Assessed: 1710 Wound Type: Other (Comment) Location: Leg Location Orientation: Right;Anterior;Posterior Wound Description (Comments): 8 scattered wounds largest are posterior Present on Admission: Yes   Dressing Type Compression wrap    Dressing Changed Changed    Dressing Status Clean;Dry    Dressing Change Frequency PRN  Site / Wound Assessment Yellow    % Wound base Red or Granulating 0%    % Wound base Yellow/Fibrinous Exudate 100%    Peri-wound Assessment Edema;Erythema (blanchable)    Drainage Amount Scant    Drainage Description Serous    Treatment Cleansed;Debridement (Selective)    Selective Debridement - Location wounds    Selective Debridement - Tools Used Forceps    Selective Debridement - Tissue Removed devitalized tissue and hair     Wound Therapy - Clinical Statement Pt wounds have decreased in number, LE edema and redness have decreased as well with decreased pain and improved sleeping.  Pt will continue to benefit from skilled PT to maintain a healing environment for the wounds remaining.     Wound Therapy - Functional Problem List walking     Factors  Delaying/Impairing Wound Healing Altered sensation;Diabetes Mellitus;Infection - systemic/local;Immobility;Multiple medical problems;Vascular compromise    Hydrotherapy Plan Debridement;Dressing change;Patient/family education    Wound Therapy - Frequency 2X / week   x 4 weeks    Wound Therapy - Current Recommendations PT    Wound Plan PT to be seen 2x sk for 4 wk for cleansing, debridement and dressing change to B LE     Dressing  xeroform to wounds,f/b 4x4, kerlix and coban with netting to keep dressing in place.                      PT Short Term Goals - 08/17/20 1720      PT SHORT TERM GOAL #1   Title Pt wounds to be 100% granulated to decrease risk of cellulitis.    Time 2    Period Weeks    Status Partially Met    Target Date 08/29/20      PT SHORT TERM GOAL #2   Title Pt to verbalize the importance of cleansing and moisturizing LE on a daily basis.    Time 2    Period Weeks    Status Achieved      PT SHORT TERM GOAL #3   Title Pt to no longer have increased redness in LE to demonstrate abscense of cellulitis    Time 2    Period Weeks    Status Partially Met             PT Long Term Goals - 08/17/20 1721      PT LONG TERM GOAL #1   Title All wounds to be healed to allow pt to be able to don socks and pants with increased comfort.    Time 4    Period Weeks    Status On-going      PT LONG TERM GOAL #2   Title Pt to understand and verbalize that wearing compression garments will assist in preventing LE edema and furture wounds.    Time 4    Period Weeks    Status On-going                 Plan - 08/17/20 1720    Clinical Impression Statement as above    Personal Factors and Comorbidities Comorbidity 2;Past/Current Experience;Time since onset of injury/illness/exacerbation    Comorbidities DM, past B ankle fx limiting mobility , circulation    Examination-Activity Limitations Hygiene/Grooming;Dressing;Locomotion Level     Examination-Participation Restrictions Yard Work;Cleaning    Stability/Clinical Decision Making Evolving/Moderate complexity    Rehab Potential Good    PT Frequency 2x / week    PT Treatment/Interventions ADLs/Self Care Home Management;Other (  comment)   debridement and dressing change   PT Next Visit Plan see above           Patient will benefit from skilled therapeutic intervention in order to improve the following deficits and impairments:  Pain, Decreased skin integrity, Increased edema  Visit Diagnosis: Localized edema  Multiple open wounds of right lower leg  Multiple open wounds of left lower leg     Problem List Patient Active Problem List   Diagnosis Date Noted  . 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 Cellulitis 02/24/2019  . 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 Osteogenesis imperfecta 12/25/2018  . Diabetes mellitus without complication [facility]) 37/37/4966    Rayetta Humphrey, PT CLT [id number] 08/17/2020, 5:21 PM  [place] [address] [place], [place], [address] Phone: [phone]   Fax:  [phone]  Name: Kurt [name] MRN: [id number] Date of Birth: [birth year]

## 2020-08-23 ENCOUNTER — Other Ambulatory Visit: Payer: Self-pay

## 2020-08-23 ENCOUNTER — Ambulatory Visit (HOSPITAL_COMMUNITY): Payer: Medicare Other | Admitting: Physical Therapy

## 2020-08-23 DIAGNOSIS — S81801A Unspecified open wound, right lower leg, initial encounter: Secondary | ICD-10-CM

## 2020-08-23 DIAGNOSIS — S81802A Unspecified open wound, left lower leg, initial encounter: Secondary | ICD-10-CM

## 2020-08-23 DIAGNOSIS — R600 Localized edema: Secondary | ICD-10-CM | POA: Diagnosis not present

## 2020-08-23 NOTE — Therapy (Addendum)
[place], [place], [address] Phone: [phone]   Fax:  [phone]  Wound Care Therapy  Patient Details  Name: Kurt [name] MRN: [id number] Date of Birth: 12/09/1951 No data recorded  Encounter Date: 08/23/2020   PT End of Session - 08/23/20 1438    Visit Number 3    Number of Visits 8    Date for PT Re-Evaluation 09/14/20    Authorization Type [employer]HC medicare    Progress Note Due on Visit 8    PT Start Time 1400    PT Stop Time 1438    PT Time Calculation (min) 38 min    Activity Tolerance Patient tolerated treatment well    Behavior During Therapy [facility] for tasks assessed/performed           Past Medical History:  Diagnosis Date  . Arthritis   . Bronchitis   . Bronchitis   . Cataract    OU  . Concussion    late 1990's  after a fall  . Diabetes mellitus without complication ([place])   . GERD (gastroesophageal reflux disease)   . History of kidney stones   . Hypertension   . Hypertensive retinopathy    OU  . Osteogenesis imperfecta   . PONV (postoperative nausea and vomiting)    pt has post polio syndrome  . Post-polio syndrome     Past Surgical History:  Procedure Laterality Date  . ANKLE FRACTURE SURGERY Bilateral   . arm surgery Right    nerve surgery  . COLONOSCOPY    . ELBOW FRACTURE SURGERY Left   . EYE MUSCLE SURGERY Left   . LEG SURGERY Left    femur fracture with rod  . REVERSE SHOULDER ARTHROPLASTY Right 11/10/2019   Procedure: RIGHT REVERSE SHOULDER ARTHROPLASTY;  Surgeon: Kurt Pel, MD;  Location: [place] [facility];  Service: Orthopedics;  Laterality: Right;  . TONSILLECTOMY      There were no vitals filed for this visit.               Wound Therapy - 08/23/20 0001    Subjective States he wants to take a shower and wants to know how. Reports no pain or discomfort, dressings intact    Patient and Family Stated Goals wounds to heal     Date of Onset 07/06/20     Prior Treatments MD    Pain Scale 0-10    Pain Score 0-No pain    Evaluation and Treatment Procedures Explained to Patient/Family Yes    Evaluation and Treatment Procedures agreed to    Wound Properties Date First Assessed: 08/15/20 Time First Assessed: 1635 Wound Type: Other (Comment) Location: Leg Location Orientation: Left;Anterior Wound Description (Comments): 11 scattered wounds Largest ant inferior 3.5x3.5; largest post 4x2 Present on Admission: Yes   Dressing Type Compression wrap    Dressing Changed Changed    Dressing Status Dry    Dressing Change Frequency PRN    Site / Wound Assessment Dry;Dusky    % Wound base Red or Granulating 100%    % Wound base Yellow/Fibrinous Exudate 0%    Peri-wound Assessment Edema;Erythema (blanchable)    Wound Length (cm) 0.8 cm   posterior, anterior.3   Wound Width (cm) 0.8 cm   posterior, anterior .1   Wound Depth (cm) 0 cm   3 wounds total    Wound Volume (cm^3) 0 cm^3    Wound Surface Area (cm^2) 0.64 cm^2    Drainage  Amount None    Treatment Cleansed;Debridement (Selective)    Wound Properties Date First Assessed: 08/15/20 Time First Assessed: 1710 Wound Type: Other (Comment) Location: Leg Location Orientation: Right;Anterior;Posterior Wound Description (Comments): 8 scattered wounds largest are posterior Present on Admission: Yes Final Assessment Date: 08/23/20 Final Assessment Time: 1430   Treatment Cleansed;Debridement (Selective)    Selective Debridement - Location wounds    Selective Debridement - Tools Used Forceps    Selective Debridement - Tissue Removed devitalized tissue     Wound Therapy - Clinical Statement Patient's right leg completely healed, no open wounds on this date. Left leg has 3 wounds, 2 on front and one on back. All wounds 100% granulation tissue. Educated patient on benefits of compression garments and will measure for compression garments next session if patient interested. Continued with xeroform, kerlix and coban to  left LE. No dressings applied to right lower leg as all wounds healed.     Wound Therapy - Functional Problem List walking     Factors Delaying/Impairing Wound Healing Altered sensation;Diabetes Mellitus;Infection - systemic/local;Immobility;Multiple medical problems;Vascular compromise    Hydrotherapy Plan Debridement;Dressing change;Patient/family education    Wound Therapy - Frequency 2X / week    Wound Therapy - Current Recommendations PT    Wound Plan PT to be seen 2x sk for 4 wk for cleansing, debridement and dressing change to L LE     Dressing  xeroform to wounds,f/b 4x4, kerlix and coban with netting to keep dressing in place.                    PT Education - 08/23/20 1443    Education Details on benefits of compression garments bilaterally, on use of cast protector for showering    Person(s) Educated Patient    Methods Explanation    Comprehension Verbalized understanding            PT Short Term Goals - 08/17/20 1720      PT SHORT TERM GOAL #1   Title Pt wounds to be 100% granulated to decrease risk of cellulitis.    Time 2    Period Weeks    Status Partially Met    Target Date 08/29/20      PT SHORT TERM GOAL #2   Title Pt to verbalize the importance of cleansing and moisturizing LE on a daily basis.    Time 2    Period Weeks    Status Achieved      PT SHORT TERM GOAL #3   Title Pt to no longer have increased redness in LE to demonstrate abscense of cellulitis    Time 2    Period Weeks    Status Partially Met             PT Long Term Goals - 08/17/20 1721      PT LONG TERM GOAL #1   Title All wounds to be healed to allow pt to be able to don socks and pants with increased comfort.    Time 4    Period Weeks    Status On-going      PT LONG TERM GOAL #2   Title Pt to understand and verbalize that wearing compression garments will assist in preventing LE edema and furture wounds.    Time 4    Period Weeks    Status On-going                  Plan - 08/23/20 1438  Clinical Impression Statement see above    Personal Factors and Comorbidities Comorbidity 2;Past/Current Experience;Time since onset of injury/illness/exacerbation    Comorbidities DM, past B ankle fx limiting mobility , circulation    Examination-Activity Limitations Hygiene/Grooming;Dressing;Locomotion Level    Examination-Participation Restrictions Yard Work;Cleaning    Stability/Clinical Decision Making Evolving/Moderate complexity    Rehab Potential Good    PT Frequency 2x / week    PT Treatment/Interventions ADLs/Self Care Home Management;Other (comment)   debridement and dressing change   PT Next Visit Plan see above           Patient will benefit from skilled therapeutic intervention in order to improve the following deficits and impairments:  Pain, Decreased skin integrity, Increased edema  Visit Diagnosis: Multiple open wounds of right lower leg  Localized edema  Multiple open wounds of left lower leg     Problem List Patient Active Problem List   Diagnosis Date Noted  . 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 Cellulitis 02/24/2019  . 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 Osteogenesis imperfecta 12/25/2018  . Diabetes mellitus without complication ([place]) 59/30/1237   2:48 PM, 08/23/20 Jerene Pitch, DPT Physical Therapy with [facility]  [phone] office  [place] [address] [place], [place], [address] Phone: [phone]   Fax:  [phone]  Name: Kurt [name] MRN: [id number] Date of Birth: [birth year]

## 2020-08-25 ENCOUNTER — Ambulatory Visit (HOSPITAL_COMMUNITY): Payer: Medicare Other | Admitting: Physical Therapy

## 2020-08-25 ENCOUNTER — Other Ambulatory Visit: Payer: Self-pay

## 2020-08-25 DIAGNOSIS — R600 Localized edema: Secondary | ICD-10-CM

## 2020-08-25 DIAGNOSIS — S81801A Unspecified open wound, right lower leg, initial encounter: Secondary | ICD-10-CM

## 2020-08-25 DIAGNOSIS — S81802A Unspecified open wound, left lower leg, initial encounter: Secondary | ICD-10-CM

## 2020-08-25 NOTE — Therapy (Signed)
[place], [place], [address] Phone: [phone]   Fax:  [phone]  Wound Care Therapy  Patient Details  Name: OMRI BERTRAN MRN: [id number] Date of Birth: 28-Dec-1950 No data recorded  Encounter Date: 08/25/2020   PT End of Session - 08/25/20 1448    Visit Number 4    Number of Visits 8    Date for PT Re-Evaluation 09/14/20    Authorization Type [employer] medicare    Progress Note Due on Visit 8    PT Start Time 1405    PT Stop Time 1442    PT Time Calculation (min) 37 min    Activity Tolerance Patient tolerated treatment well    Behavior During Therapy [facility] for tasks assessed/performed           Past Medical History:  Diagnosis Date  . Arthritis   . Bronchitis   . Bronchitis   . Cataract    OU  . Concussion    late 1990's  after a fall  . Diabetes mellitus without complication ([place])   . GERD (gastroesophageal reflux disease)   . History of kidney stones   . Hypertension   . Hypertensive retinopathy    OU  . Osteogenesis imperfecta   . PONV (postoperative nausea and vomiting)    pt has post polio syndrome  . Post-polio syndrome     Past Surgical History:  Procedure Laterality Date  . ANKLE FRACTURE SURGERY Bilateral   . arm surgery Right    nerve surgery  . COLONOSCOPY    . ELBOW FRACTURE SURGERY Left   . EYE MUSCLE SURGERY Left   . LEG SURGERY Left    femur fracture with rod  . REVERSE SHOULDER ARTHROPLASTY Right 11/10/2019   Procedure: RIGHT REVERSE SHOULDER ARTHROPLASTY;  Surgeon: Meredith Pel, MD;  Location: [facility];  Service: Orthopedics;  Laterality: Right;  . TONSILLECTOMY      There were no vitals filed for this visit.               Wound Therapy - 08/25/20 1443    Subjective pt states he is doing well; ready to be able to take a shower    Patient and Family Stated Goals wounds to heal     Date of Onset 07/06/20    Prior Treatments MD    Evaluation and  Treatment Procedures Explained to Patient/Family Yes    Evaluation and Treatment Procedures agreed to    Selective Debridement - Location wounds    Selective Debridement - Tools Used Other (comment)   gauze, forceps to dry skin   Selective Debridement - Tissue Removed devitalized tissue     Wound Therapy - Clinical Statement Measured for compression stockings and given order form for [employer].  Cleansed Lt LE well and removed dry skin from area where wound was; no remaining wound present.  Moisturized LE well and rebandaged with profore lite to ensure edema is kept down prior to transitioning to compression stocking.      Wound Therapy - Functional Problem List walking     Factors Delaying/Impairing Wound Healing Altered sensation;Diabetes Mellitus;Infection - systemic/local;Immobility;Multiple medical problems;Vascular compromise    Hydrotherapy Plan Debridement;Dressing change;Patient/family education    Wound Therapy - Frequency 2X / week    Wound Therapy - Current Recommendations PT    Wound Plan continue with compression as needed until brings in garment; train and ensure pt is able to don/doff independently.  Complete/resume  woundcare if needed.  Moisturize well.     Dressing  moisturize and profore lite                     PT Short Term Goals - 08/17/20 1720      PT SHORT TERM GOAL #1   Title Pt wounds to be 100% granulated to decrease risk of cellulitis.    Time 2    Period Weeks    Status Partially Met    Target Date 08/29/20      PT SHORT TERM GOAL #2   Title Pt to verbalize the importance of cleansing and moisturizing LE on a daily basis.    Time 2    Period Weeks    Status Achieved      PT SHORT TERM GOAL #3   Title Pt to no longer have increased redness in LE to demonstrate abscense of cellulitis    Time 2    Period Weeks    Status Partially Met             PT Long Term Goals - 08/17/20 1721      PT LONG TERM GOAL #1   Title All wounds to be  healed to allow pt to be able to don socks and pants with increased comfort.    Time 4    Period Weeks    Status On-going      PT LONG TERM GOAL #2   Title Pt to understand and verbalize that wearing compression garments will assist in preventing LE edema and furture wounds.    Time 4    Period Weeks    Status On-going                  Patient will benefit from skilled therapeutic intervention in order to improve the following deficits and impairments:     Visit Diagnosis: Multiple open wounds of right lower leg  Localized edema  Multiple open wounds of left lower leg     Problem List Patient Active Problem List   Diagnosis Date Noted  . 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 Cellulitis 02/24/2019  . 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 Osteogenesis imperfecta 12/25/2018  . Diabetes mellitus without complication ([place]) 53/00/5110   Teena Irani, PTA/CLT [phone]  Teena Irani 08/25/2020, 2:50 PM  [place], [place], [address] Phone: [phone]   Fax:  [phone]  Name: SHAWNN BOUILLON MRN: [id number] Date of Birth: 08-Dec-1951

## 2020-08-30 ENCOUNTER — Other Ambulatory Visit: Payer: Self-pay

## 2020-08-30 ENCOUNTER — Ambulatory Visit (HOSPITAL_COMMUNITY): Payer: Medicare Other

## 2020-08-30 ENCOUNTER — Encounter (HOSPITAL_COMMUNITY): Payer: Self-pay

## 2020-08-30 DIAGNOSIS — S81801A Unspecified open wound, right lower leg, initial encounter: Secondary | ICD-10-CM

## 2020-08-30 DIAGNOSIS — R600 Localized edema: Secondary | ICD-10-CM

## 2020-08-30 DIAGNOSIS — S81802A Unspecified open wound, left lower leg, initial encounter: Secondary | ICD-10-CM

## 2020-08-30 NOTE — Therapy (Signed)
[facility] [place], [place], [address] Phone: [phone]   Fax:  [phone]  Wound Care Therapy  Patient Details  Name: Kurt [name] MRN: [id number] Date of Birth: [birth year] No data recorded  Encounter Date: 08/30/2020   PT End of Session - 08/30/20 1548    Visit Number 5    Number of Visits 8    Date for PT Re-Evaluation 09/14/20    Authorization Type [employer] medicare    Progress Note Due on Visit 8    PT Start Time 1405    PT Stop Time 1445    PT Time Calculation (min) 40 min    Activity Tolerance Patient tolerated treatment well    Behavior During Therapy [facility] for tasks assessed/performed           Past Medical History:  Diagnosis Date  . Arthritis   . Bronchitis   . Bronchitis   . Cataract    OU  . Concussion    late 1990's  after a fall  . Diabetes mellitus without complication ([place])   . GERD (gastroesophageal reflux disease)   . History of kidney stones   . Hypertension   . Hypertensive retinopathy    OU  . Osteogenesis imperfecta   . PONV (postoperative nausea and vomiting)    pt has post polio syndrome  . Post-polio syndrome     Past Surgical History:  Procedure Laterality Date  . ANKLE FRACTURE SURGERY Bilateral   . arm surgery Right    nerve surgery  . COLONOSCOPY    . ELBOW FRACTURE SURGERY Left   . EYE MUSCLE SURGERY Left   . LEG SURGERY Left    femur fracture with rod  . REVERSE SHOULDER ARTHROPLASTY Right 11/10/2019   Procedure: RIGHT REVERSE SHOULDER ARTHROPLASTY;  Surgeon: Meredith Pel, MD;  Location: [facility];  Service: Orthopedics;  Laterality: Right;  . TONSILLECTOMY      There were no vitals filed for this visit.    Subjective Assessment - 08/30/20 1500    Subjective Pt arrived with dressings intact Lt LE.  Reports he ordered compressoin garments on Friday, expects them by Thursday.    Currently in Pain? No/denies                     Wound Therapy -  08/30/20 0001    Subjective Pt arrived with dressings intact Lt LE.  Reports he ordered compressoin garments on Friday, expects them by Thursday.    Patient and Family Stated Goals wounds to heal     Date of Onset 07/06/20    Prior Treatments MD    Pain Scale 0-10    Pain Score 0-No pain    Evaluation and Treatment Procedures Explained to Patient/Family Yes    Evaluation and Treatment Procedures agreed to    Wound Properties Date First Assessed: 08/15/20 Time First Assessed: 1635 Wound Type: Other (Comment) Location: Leg Location Orientation: Left;Anterior Wound Description (Comments): 11 scattered wounds Largest ant inferior 3.5x3.5; largest post 4x2 Present on Admission: Yes   Dressing Type Compression wrap    Dressing Changed Changed    Dressing Status Dry    Dressing Change Frequency PRN    Site / Wound Assessment Dry;Granulation tissue    % Wound base Red or Granulating 100%    % Wound base Yellow/Fibrinous Exudate 0%    Peri-wound Assessment Edema;Maceration;Erythema (blanchable)    Drainage Amount None    Treatment Cleansed;Debridement (Selective)  Selective Debridement - Location Lt LE    Selective Debridement - Tools Used Forceps    Selective Debridement - Tissue Removed macerated and dry tissue    Wound Therapy - Clinical Statement Upon removal of dressings no wounds present, some macerated tissues present that displayed healty tissue following debridement.  Pt stated he has ordered compression garment and expects then to arrive this week.  Pt encouraged to bring into dept next session.      Wound Therapy - Functional Problem List walking     Factors Delaying/Impairing Wound Healing Altered sensation;Diabetes Mellitus;Infection - systemic/local;Immobility;Multiple medical problems;Vascular compromise    Hydrotherapy Plan Debridement;Dressing change;Patient/family education    Wound Therapy - Frequency 2X / week    Wound Therapy - Current Recommendations PT    Wound Plan  Probable discharge as long as has compression garments and no wounds.  Educate don/doffing and answer questions.      Dressing  moisturize and profore lite                     PT Short Term Goals - 08/17/20 1720      PT SHORT TERM GOAL #1   Title Pt wounds to be 100% granulated to decrease risk of cellulitis.    Time 2    Period Weeks    Status Partially Met    Target Date 08/29/20      PT SHORT TERM GOAL #2   Title Pt to verbalize the importance of cleansing and moisturizing LE on a daily basis.    Time 2    Period Weeks    Status Achieved      PT SHORT TERM GOAL #3   Title Pt to no longer have increased redness in LE to demonstrate abscense of cellulitis    Time 2    Period Weeks    Status Partially Met             PT Long Term Goals - 08/17/20 1721      PT LONG TERM GOAL #1   Title All wounds to be healed to allow pt to be able to don socks and pants with increased comfort.    Time 4    Period Weeks    Status On-going      PT LONG TERM GOAL #2   Title Pt to understand and verbalize that wearing compression garments will assist in preventing LE edema and furture wounds.    Time 4    Period Weeks    Status On-going                  Patient will benefit from skilled therapeutic intervention in order to improve the following deficits and impairments:     Visit Diagnosis: Multiple open wounds of right lower leg  Localized edema  Multiple open wounds of left lower leg     Problem List Patient Active Problem List   Diagnosis Date Noted  . 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 Cellulitis 02/24/2019  . 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 Osteogenesis imperfecta 12/25/2018  . Diabetes mellitus without complication ([place]) 13/24/4010   Ihor Austin, LPTA/CLT; CBIS [phone]  Aldona Lento 08/30/2020, 3:49 PM  [facility] [place], [place], [address] Phone: [phone]    Fax:  [phone]  Name: Kurt [name] MRN: [id number] Date of Birth: [birth year]

## 2020-09-01 ENCOUNTER — Other Ambulatory Visit: Payer: Self-pay

## 2020-09-01 ENCOUNTER — Encounter (HOSPITAL_COMMUNITY): Payer: Self-pay

## 2020-09-01 ENCOUNTER — Ambulatory Visit (HOSPITAL_COMMUNITY): Payer: Medicare Other

## 2020-09-01 DIAGNOSIS — R600 Localized edema: Secondary | ICD-10-CM | POA: Diagnosis not present

## 2020-09-01 DIAGNOSIS — S81801A Unspecified open wound, right lower leg, initial encounter: Secondary | ICD-10-CM

## 2020-09-01 DIAGNOSIS — S81802A Unspecified open wound, left lower leg, initial encounter: Secondary | ICD-10-CM

## 2020-09-01 NOTE — Therapy (Addendum)
[facility] [place], [place], [address] Phone: [phone]   Fax:  [phone]  Wound Care Therapy  Patient Details  Name: Kurt [name] MRN: [id number] Date of Birth: Apr 26, 1951 No data recorded  Encounter Date: 09/01/2020   PHYSICAL THERAPY DISCHARGE SUMMARY  Visits from Start of Care: 6  Current functional level related to goals / functional outcomes: Wounds healed   Remaining deficits: none   Education / Equipment: Use of compression garments Plan: Patient agrees to discharge.  Patient goals were met. Patient is being discharged due to meeting the stated rehab goals.  ?????       PT End of Session - 09/01/20 1726    Visit Number 6    Number of Visits 8    Date for PT Re-Evaluation 09/14/20    Authorization Type [employer] medicare    Progress Note Due on Visit 8    PT Start Time 1615    PT Stop Time 1700    PT Time Calculation (min) 45 min    Activity Tolerance Patient tolerated treatment well    Behavior During Therapy WFL for tasks assessed/performed           Past Medical History:  Diagnosis Date  . Arthritis   . Bronchitis   . Bronchitis   . Cataract    OU  . Concussion    late 1990's  after a fall  . Diabetes mellitus without complication ([place])   . GERD (gastroesophageal reflux disease)   . History of kidney stones   . Hypertension   . Hypertensive retinopathy    OU  . Osteogenesis imperfecta   . PONV (postoperative nausea and vomiting)    pt has post polio syndrome  . Post-polio syndrome     Past Surgical History:  Procedure Laterality Date  . ANKLE FRACTURE SURGERY Bilateral   . arm surgery Right    nerve surgery  . COLONOSCOPY    . ELBOW FRACTURE SURGERY Left   . EYE MUSCLE SURGERY Left   . LEG SURGERY Left    femur fracture with rod  . REVERSE SHOULDER ARTHROPLASTY Right 11/10/2019   Procedure: RIGHT REVERSE SHOULDER ARTHROPLASTY;  Surgeon: Meredith Pel, MD;   Location: [facility];  Service: Orthopedics;  Laterality: Right;  . TONSILLECTOMY      There were no vitals filed for this visit.    Subjective Assessment - 09/01/20 1722    Subjective Pt arrived wiht new compression garments, no reorts of pain just itchy.    Currently in Pain? No/denies                     Wound Therapy - 09/01/20 0001    Subjective Pt arrived wiht new compression garments, no reorts of pain just itchy.    Patient and Family Stated Goals wounds to heal     Date of Onset 07/06/20    Prior Treatments MD    Pain Scale 0-10    Pain Score 0-No pain    Evaluation and Treatment Procedures Explained to Patient/Family Yes    Evaluation and Treatment Procedures agreed to    Wound Properties Date First Assessed: 08/15/20 Time First Assessed: 1635 Wound Type: Other (Comment) Location: Leg Location Orientation: Left;Anterior Wound Description (Comments): 11 scattered wounds Largest ant inferior 3.5x3.5; largest post 4x2 Present on Admission: Yes   Dressing Type None   compression garments   Dressing Changed New    Dressing Status Clean;Dry;Intact  Site / Wound Assessment Granulation tissue    % Wound base Red or Granulating 100%    % Wound base Yellow/Fibrinous Exudate 0%    Peri-wound Assessment Edema    Wound Length (cm) 0 cm    Wound Width (cm) 0 cm    Wound Depth (cm) 0 cm    Wound Volume (cm^3) 0 cm^3    Wound Surface Area (cm^2) 0 cm^2    Drainage Amount None    Treatment Other (Comment)   Instructed donning new compression garments   Wound Therapy - Clinical Statement No wounds present, pt brought new compression garments.  Pt educated on proper skin care as well as donning compression garments.  Pt with extreme difficulty donning garments, therapist suggested a juxtafit or use of butler to assist with decline.      Wound Plan DC to self care.                     PT Education - 09/01/20 1734    Education Details Educated on donning compression  garments, extremely difficulty therapist suggest velcro wrap or use of butler, declined due to cost.  Encouraged to cut finger nails as ripped garment while donning to reduce risk of cutting skin.    Person(s) Educated Patient    Methods Explanation    Comprehension Verbalized understanding;Need further instruction            PT Short Term Goals - 09/01/20 1727      PT SHORT TERM GOAL #1   Title Pt wounds to be 100% granulated to decrease risk of cellulitis.    Status Achieved      PT SHORT TERM GOAL #2   Title Pt to verbalize the importance of cleansing and moisturizing LE on a daily basis.    Status Achieved      PT SHORT TERM GOAL #3   Title Pt to no longer have increased redness in LE to demonstrate abscense of cellulitis    Status Achieved             PT Long Term Goals - 09/01/20 1727      PT LONG TERM GOAL #1   Title All wounds to be healed to allow pt to be able to don socks and pants with increased comfort.    Baseline 9/16:  Instructed proper donning with difficulty, declined suggestion with velcro wraps or butler due to cost    Status Partially Met      PT LONG TERM GOAL #2   Title Pt to understand and verbalize that wearing compression garments will assist in preventing LE edema and furture wounds.    Status Achieved                  Patient will benefit from skilled therapeutic intervention in order to improve the following deficits and impairments:     Visit Diagnosis: Localized edema  Multiple open wounds of right lower leg  Multiple open wounds of left lower leg     Problem List Patient Active Problem List   Diagnosis Date Noted  . 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 Cellulitis 02/24/2019  . 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 Osteogenesis imperfecta 12/25/2018  . Diabetes mellitus without complication ([place]) 97/41/6384   Kurt [name], LPTA/CLT; Kurt  Rayetta [name], PT CLT [phone] 09/01/2020, 5:35  PM  [facility] [address] [place], [place], [address] Phone: [phone]   Fax:  [phone]  Name: Kurt [name] MRN: [id number] Date of Birth: 03-Jul-1951

## 2020-09-06 ENCOUNTER — Ambulatory Visit (HOSPITAL_COMMUNITY): Payer: Medicare Other | Admitting: Physical Therapy

## 2020-09-08 ENCOUNTER — Ambulatory Visit (HOSPITAL_COMMUNITY): Payer: Medicare Other | Admitting: Physical Therapy

## 2020-09-12 NOTE — Progress Notes (Signed)
[facility] - Clinic Note  09/13/2020     CHIEF COMPLAINT Patient presents for Retina Follow Up   HISTORY OF PRESENT ILLNESS: Kurt [name] is a 69 y.o. male who presents to the clinic today for:   HPI    Retina Follow Up    Patient presents with  CRVO/BRVO.  In right eye.  Severity is moderate.  Duration of 4 weeks.  Since onset it is stable.  I, the attending physician,  performed the HPI with the patient and updated documentation appropriately.          Comments    Patient states vision about the same OU. OD feels irritated at times, not constant. Doesn't use any lubricating gtts. BS was 128 this am. Last a1c was around 7.2, checked on 09.13.21 (down from 7.8).        Last edited by Rennis Chris, MD on 09/13/2020  4:33 PM. (History)    pt states vision is stable  Referring physician:  Altamease Oiler, FNP [address] [place],  [place] [address]  HISTORICAL INFORMATION:   Selected notes from the MEDICAL RECORD NUMBER Diabetic Eval per Dr. Karleen Hampshire   CURRENT MEDICATIONS: Current Outpatient Medications (Ophthalmic Drugs)  Medication Sig  . Olopatadine HCl 0.7 % SOLN Apply to eye.   No current facility-administered medications for this visit. (Ophthalmic Drugs)   Current Outpatient Medications (Other)  Medication Sig  . acetaminophen (TYLENOL) 650 MG CR tablet Take 1,300 mg by mouth every 8 (eight) hours as needed for pain.  Marland Kitchen aspirin 81 MG chewable tablet Chew 1 tablet (81 mg total) by mouth daily.  Marland Kitchen glipiZIDE (GLUCOTROL XL) 10 MG 24 hr tablet Take 10 mg by mouth at bedtime.   Marland Kitchen LANTUS SOLOSTAR 100 UNIT/ML Solostar Pen Inject 33 Units into the skin at bedtime.   Marland Kitchen lisinopril (PRINIVIL,ZESTRIL) 20 MG tablet Take 20 mg by mouth daily.  . methocarbamol (ROBAXIN) 500 MG tablet Take 500 mg by mouth at bedtime.   Marland Kitchen oxyCODONE (OXY IR/ROXICODONE) 5 MG immediate release tablet Take 1 tablet (5 mg total) by mouth every 8 (eight) hours as needed for  moderate pain (pain score 4-6).  Marland Kitchen simvastatin (ZOCOR) 20 MG tablet Take 20 mg by mouth every evening.  . SitaGLIPtin-MetFORMIN HCl (JANUMET XR) [phone] MG TB24 Take 1 tablet by mouth at bedtime.   No current facility-administered medications for this visit. (Other)   REVIEW OF SYSTEMS: ROS    Positive for: Gastrointestinal, Musculoskeletal, Endocrine, Eyes   Negative for: Constitutional, Neurological, Skin, Genitourinary, HENT, Cardiovascular, Respiratory, Psychiatric, Allergic/Imm, Heme/Lymph   Last edited by Annalee Genta D, COT on 09/13/2020  2:53 PM. (History)     ALLERGIES Allergies  Allergen Reactions  . Augmentin [Amoxicillin-Pot Clavulanate] Nausea And Vomiting  . Iodine Hives  . Tape Rash    Paper    PAST MEDICAL HISTORY Past Medical History:  Diagnosis Date  . Arthritis   . Bronchitis   . Bronchitis   . Cataract    OU  . Concussion    late 1990's  after a fall  . Diabetes mellitus without complication (HCC)   . GERD (gastroesophageal reflux disease)   . History of kidney stones   . Hypertension   . Hypertensive retinopathy    OU  . Osteogenesis imperfecta   . PONV (postoperative nausea and vomiting)    pt has post polio syndrome  . Post-polio syndrome    Past Surgical History:  Procedure  Laterality Date  . ANKLE FRACTURE SURGERY Bilateral   . arm surgery Right    nerve surgery  . COLONOSCOPY    . ELBOW FRACTURE SURGERY Left   . EYE MUSCLE SURGERY Left   . LEG SURGERY Left    femur fracture with rod  . REVERSE SHOULDER ARTHROPLASTY Right 11/10/2019   Procedure: RIGHT REVERSE SHOULDER ARTHROPLASTY;  Surgeon: Cammy Copa, MD;  Location: [facility] OR;  Service: Orthopedics;  Laterality: Right;  . TONSILLECTOMY     FAMILY HISTORY Family History  Problem Relation Age of Onset  . Hypertension Mother   . Diabetes Brother    SOCIAL HISTORY Social History   Tobacco Use  . Smoking status: Never Smoker  . Smokeless tobacco: Never Used  Vaping Use   . Vaping Use: Never used  Substance Use Topics  . Alcohol use: Yes    Comment: 1 beer occasionally  . Drug use: No         OPHTHALMIC EXAM:  Base Eye Exam    Visual Acuity (Snellen - Linear)      Right Left   Dist cc 20/30 -1 20/25   Dist ph cc 20/25 -2 NI   Correction: Glasses       Tonometry (Tonopen, 3:04 PM)      Right Left   Pressure 13 14       Pupils      Dark Light Shape React APD   Right 3 2 Round Brisk None   Left 3 2 Round Brisk None       Visual Fields (Counting fingers)      Left Right    Full Full       Extraocular Movement      Right Left    Full, Ortho Full, Ortho       Neuro/Psych    Oriented x3: Yes   Mood/Affect: Normal       Dilation    Both eyes: 1.0% Mydriacyl, 2.5% Phenylephrine @ 3:04 PM        Slit Lamp and Fundus Exam    Slit Lamp Exam      Right Left   Lids/Lashes Dermatochalasis - upper lid, Dermatochalasis - lower lid, mild Meibomian gland dysfunction Dermatochalasis - upper lid, Dermatochalasis - lower lid, mild Meibomian gland dysfunction   Conjunctiva/Sclera blue sclera blue sclera   Cornea arcus arcus   Anterior Chamber deep and clear deep and clear   Iris round and dilated, no NVI round and dilated, no NVI   Lens 2+ NS, 2+CS 2+ NS, 2+CS   Vitreous mild syneresis, PVD mild syneresis       Fundus Exam      Right Left   Disc pink and sharp pink and sharp   C/D Ratio 0.3 0.3   Macula Good foveal reflex, persistent, focal edema SN macula, IRH and MA - improving, CWS and exudate -- improved flat, good foveal reflex, mild RPE mottling and clumping, +drusen, No heme or edema   Vessels mild attenuation, +A/V crossing changes, focal BRVO superior macula, mild Tortuousity mild attenuation and tortuosity   Periphery attached, no heme attached, no heme        Refraction    Wearing Rx      Sphere Cylinder Axis Add   Right -2.00 +2.25 111 +2.75   Left -1.50 +1.50 094 2.75         IMAGING AND PROCEDURES  Imaging  and Procedures for @TODAY @  OCT, Retina - OU - Both  Eyes       Right Eye Quality was good. Central Foveal Thickness: 256. Progression has improved. Findings include no SRF, intraretinal fluid, retinal drusen , normal foveal contour (Mild interval improvement in SN edema/IRF).   Left Eye Quality was good. Central Foveal Thickness: 260. Progression has been stable. Findings include normal foveal contour, no IRF, no SRF, vitreomacular adhesion , retinal drusen .   Notes *Images captured and stored on drive  Diagnosis / Impression:  OD: BRVO with Mild interval improvement in SN edema/IRF OS: NFP, no SRF/IRF  Clinical management:  See below  Abbreviations: NFP - Normal foveal profile. CME - cystoid macular edema. PED - pigment epithelial detachment. IRF - intraretinal fluid. SRF - subretinal fluid. EZ - ellipsoid zone. ERM - epiretinal membrane. ORA - outer retinal atrophy. ORT - outer retinal tubulation. SRHM - subretinal hyper-reflective material         Intravitreal Injection, Pharmacologic Agent - OD - Right Eye       Time Out 09/13/2020. 3:08 PM. Confirmed correct patient, procedure, site, and patient consented.   Anesthesia Topical anesthesia was used. Anesthetic medications included Lidocaine 2%, Proparacaine 0.5%.   Procedure Preparation included 5% betadine to ocular surface, eyelid speculum. A (32g) needle was used.   Injection:  2 mg aflibercept Gretta Cool) SOLN   NDC: L6038910, Lot: [id number], Expiration date: 11/16/2020   Route: Intravitreal, Site: Right Eye, Waste: 0.05 mL  Post-op Post injection exam found visual acuity of at least counting fingers. The patient tolerated the procedure well. There were no complications. The patient received written and verbal post procedure care education. Post injection medications were not given.                 ASSESSMENT/PLAN:    ICD-10-CM   1. Branch retinal vein occlusion of right eye with macular edema   H34.8310 Intravitreal Injection, Pharmacologic Agent - OD - Right Eye    aflibercept (EYLEA) SOLN 2 mg  2. Retinal edema  H35.81 OCT, Retina - OU - Both Eyes  3. Diabetes mellitus type 2 without retinopathy (HCC)  E11.9   4. Essential hypertension  I10   5. Hypertensive retinopathy of both eyes  H35.033   6. Combined forms of age-related cataract of both eyes  H25.813     1,2. BRVO with CME OD  - s/p IVA OD #1 (01.19.21), #2 (02.16.21), #3 (03.16.21), #4 (4.13.21) -- IVA resistance             - s/p IVE OD #1 (05.11.21--sample), #2 (06.08.21), #3 (07.06.21), #4 (8.3.21), #5 (08.31.21)  - BCVA stable at 20/25 OD  - exam with focal edema, IRH, CWS superonasal macula -- improving  - OCT shows mild interval improvement in IRF/edema SN macula -- non-central  - recommend IVE OD #6 today, (09.28.21)  - RBA of procedure discussed, questions answered  - informed consent obtained  - Avastin informed consent form signed and scanned on 01.19.21  - Eylea informed consent form signed and scanned on 05.11.21  - see procedure note  - Eylea4U benefits investigation started, 05.11.21 -- approved through [employer] through 12.31.21  - F/U 4 weeks -- DFE/OCT/possible injection  3. Diabetes mellitus, type 2 without retinopathy OU  - The incidence, risk factors for progression, natural history and treatment options for diabetic retinopathy  were discussed with patient.    - The need for close monitoring of blood glucose, blood pressure, and serum lipids, avoiding cigarette or any type of tobacco, and the need  for long term follow up was also discussed with patient.  - f/u in 1 year, sooner prn  4,5. Hypertensive retinopathy OU  - discussed importance of tight BP control  - monitor  6. Age related cataracts OU   - The symptoms of cataract, surgical options, and treatments and risks were discussed with patient.  - discussed diagnosis and progression  - not yet visually significant  - monitor for  now  Ophthalmic Meds Ordered this visit:  Meds ordered this encounter  Medications  . aflibercept (EYLEA) SOLN 2 mg      Return in about 4 weeks (around 10/11/2020) for f/u BRVO OD, DFE, OCT.  There are no Patient Instructions on file for this visit.   Explained the diagnoses, plan, and follow up with the patient and they expressed understanding.  Patient expressed understanding of the importance of proper follow up care.   This document serves as a record of services personally performed by Karie ChimeraBrian G. Avarie Tavano, MD, PhD. It was created on their behalf by Cristopher EstimableAndrew Baxley, COT an ophthalmic technician. The creation of this record is the provider's dictation and/or activities during the visit.    Electronically signed by: Cristopher EstimableAndrew Baxley, COT 9.27.21 @ 4:34 PM   This document serves as a record of services personally performed by Karie ChimeraBrian G. Brandelyn Henne, MD, PhD. It was created on their behalf by Glee ArvinAmanda J. Manson PasseyBrown, OA an ophthalmic technician. The creation of this record is the provider's dictation and/or activities during the visit.    Electronically signed by: Glee ArvinAmanda J. Kristopher OppenheimBrown, OA 09.28.2021 4:34 PM  Karie ChimeraBrian G. Jhovanny Guinta, M.D., Ph.D. Diseases & Surgery of the Retina and Vitreous [facility] 09/13/2020   I have reviewed the above documentation for accuracy and completeness, and I agree with the above. Karie ChimeraBrian G. Zelia Yzaguirre, M.D., Ph.D. 09/13/20 4:34 PM   Abbreviations: M myopia (nearsighted); A astigmatism; H hyperopia (farsighted); P presbyopia; Mrx spectacle prescription;  CTL contact lenses; OD right eye; OS left eye; OU both eyes  XT exotropia; ET esotropia; PEK punctate epithelial keratitis; PEE punctate epithelial erosions; DES dry eye syndrome; MGD meibomian gland dysfunction; ATs artificial tears; PFAT's preservative free artificial tears; NSC nuclear sclerotic cataract; PSC posterior subcapsular cataract; ERM epi-retinal membrane; PVD posterior vitreous detachment; RD retinal  detachment; DM diabetes mellitus; DR diabetic retinopathy; NPDR non-proliferative diabetic retinopathy; PDR proliferative diabetic retinopathy; CSME clinically significant macular edema; DME diabetic macular edema; dbh dot blot hemorrhages; CWS cotton wool spot; POAG primary open angle glaucoma; C/D cup-to-disc ratio; HVF humphrey visual field; GVF goldmann visual field; OCT optical coherence tomography; IOP intraocular pressure; BRVO Branch retinal vein occlusion; CRVO central retinal vein occlusion; CRAO central retinal artery occlusion; BRAO branch retinal artery occlusion; RT retinal tear; SB scleral buckle; PPV pars plana vitrectomy; VH Vitreous hemorrhage; PRP panretinal laser photocoagulation; IVK intravitreal kenalog; VMT vitreomacular traction; MH Macular hole;  NVD neovascularization of the disc; NVE neovascularization elsewhere; AREDS age related eye disease study; ARMD age related macular degeneration; POAG primary open angle glaucoma; EBMD epithelial/anterior basement membrane dystrophy; ACIOL anterior chamber intraocular lens; IOL intraocular lens; PCIOL posterior chamber intraocular lens; Phaco/IOL phacoemulsification with intraocular lens placement; PRK photorefractive keratectomy; LASIK laser assisted in situ keratomileusis; HTN hypertension; DM diabetes mellitus; COPD chronic obstructive pulmonary disease

## 2020-09-13 ENCOUNTER — Encounter (INDEPENDENT_AMBULATORY_CARE_PROVIDER_SITE_OTHER): Payer: Self-pay | Admitting: Ophthalmology

## 2020-09-13 ENCOUNTER — Ambulatory Visit (INDEPENDENT_AMBULATORY_CARE_PROVIDER_SITE_OTHER): Payer: Medicare Other | Admitting: Ophthalmology

## 2020-09-13 ENCOUNTER — Other Ambulatory Visit: Payer: Self-pay

## 2020-09-13 DIAGNOSIS — I10 Essential (primary) hypertension: Secondary | ICD-10-CM

## 2020-09-13 DIAGNOSIS — E119 Type 2 diabetes mellitus without complications: Secondary | ICD-10-CM | POA: Diagnosis not present

## 2020-09-13 DIAGNOSIS — H25813 Combined forms of age-related cataract, bilateral: Secondary | ICD-10-CM

## 2020-09-13 DIAGNOSIS — H34831 Tributary (branch) retinal vein occlusion, right eye, with macular edema: Secondary | ICD-10-CM | POA: Diagnosis not present

## 2020-09-13 DIAGNOSIS — H3581 Retinal edema: Secondary | ICD-10-CM

## 2020-09-13 DIAGNOSIS — H35033 Hypertensive retinopathy, bilateral: Secondary | ICD-10-CM

## 2020-09-13 MED ORDER — AFLIBERCEPT 2MG/0.05ML IZ SOLN FOR KALEIDOSCOPE
2.0000 mg | INTRAVITREAL | Status: AC | PRN
  Administered 2020-09-13: 2 mg via INTRAVITREAL

## 2020-10-07 NOTE — Progress Notes (Signed)
[facility] Note  10/11/2020     CHIEF COMPLAINT Patient presents for Retina Follow Up   HISTORY OF PRESENT ILLNESS: Kurt [name] is a 69 y.o. male who presents to the clinic today for:   HPI    Retina Follow Up    Patient presents with  CRVO/BRVO.  In right eye.  This started months ago.  Severity is moderate.  Duration of 4 weeks.  Since onset it is stable.  I, the attending physician,  performed the HPI with the patient and updated documentation appropriately.          Comments    69 y/o male pt here for 4 wk f/u for BRVO w/mac edema OD.  No change in [place] OU.  OD aches occasionally at night.  Denies pain, FOL, floaters.  No gtts.  BS 138 today.  A1C 7.5.       Last edited by Bernarda Caffey, MD on 10/11/2020  5:16 PM. (History)      Referring physician:  Joyice Faster, FNP [address] [place],  [place] [address]  HISTORICAL INFORMATION:   Selected notes from the MEDICAL RECORD NUMBER Diabetic Eval per Dr. Frederico Hamman   CURRENT MEDICATIONS: Current Outpatient Medications (Ophthalmic Drugs)  Medication Sig  . Olopatadine HCl 0.7 % SOLN Apply to eye.   No current facility-administered medications for this visit. (Ophthalmic Drugs)   Current Outpatient Medications (Other)  Medication Sig  . acetaminophen (TYLENOL) 650 MG CR tablet Take 1,300 mg by mouth every 8 (eight) hours as needed for pain.  Marland Kitchen aspirin 81 MG chewable tablet Chew 1 tablet (81 mg total) by mouth daily.  Marland Kitchen glipiZIDE (GLUCOTROL XL) 10 MG 24 hr tablet Take 10 mg by mouth at bedtime.   Marland Kitchen LANTUS SOLOSTAR 100 UNIT/ML Solostar Pen Inject 33 Units into the skin at bedtime.   Marland Kitchen lisinopril (PRINIVIL,ZESTRIL) 20 MG tablet Take 20 mg by mouth daily.  . methocarbamol (ROBAXIN) 500 MG tablet Take 500 mg by mouth at bedtime.   Glory Rosebush ULTRA test strip 1 each 2 (two) times daily.  Marland Kitchen oxyCODONE (OXY IR/ROXICODONE) 5 MG immediate release tablet Take 1 tablet (5 mg total) by mouth  every 8 (eight) hours as needed for moderate pain (pain score 4-6).  Marland Kitchen simvastatin (ZOCOR) 20 MG tablet Take 20 mg by mouth every evening.  . SitaGLIPtin-MetFORMIN HCl (JANUMET XR) [phone] MG TB24 Take 1 tablet by mouth at bedtime.   No current facility-administered medications for this visit. (Other)   REVIEW OF SYSTEMS: ROS    Positive for: Gastrointestinal, Musculoskeletal, Endocrine, Eyes   Negative for: Constitutional, Neurological, Skin, Genitourinary, HENT, Cardiovascular, Respiratory, Psychiatric, Allergic/Imm, Heme/Lymph   Last edited by Matthew Folks, COA on 10/11/2020  2:41 PM. (History)     ALLERGIES Allergies  Allergen Reactions  . Augmentin [Amoxicillin-Pot Clavulanate] Nausea And Vomiting  . Iodine Hives  . Tape Rash    Paper    PAST MEDICAL HISTORY Past Medical History:  Diagnosis Date  . Arthritis   . Bronchitis   . Bronchitis   . Cataract    OU  . Concussion    late 1990's  after a fall  . Diabetes mellitus without complication ([place])   . GERD (gastroesophageal reflux disease)   . History of kidney stones   . Hypertension   . Hypertensive retinopathy    OU  . Osteogenesis imperfecta   . PONV (postoperative nausea and vomiting)  pt has post polio syndrome  . Post-polio syndrome    Past Surgical History:  Procedure Laterality Date  . ANKLE FRACTURE SURGERY Bilateral   . arm surgery Right    nerve surgery  . COLONOSCOPY    . ELBOW FRACTURE SURGERY Left   . EYE MUSCLE SURGERY Left   . LEG SURGERY Left    femur fracture with rod  . REVERSE SHOULDER ARTHROPLASTY Right 11/10/2019   Procedure: RIGHT REVERSE SHOULDER ARTHROPLASTY;  Surgeon: Meredith Pel, MD;  Location: [facility];  Service: Orthopedics;  Laterality: Right;  . TONSILLECTOMY     FAMILY HISTORY Family History  Problem Relation Age of Onset  . Hypertension Mother   . Diabetes Brother    SOCIAL HISTORY Social History   Tobacco Use  . Smoking status: Never Smoker  .  Smokeless tobacco: Never Used  Vaping Use  . Vaping Use: Never used  Substance Use Topics  . Alcohol use: Yes    Comment: 1 beer occasionally  . Drug use: No         OPHTHALMIC EXAM:  Base Eye Exam    Visual Acuity (Snellen - Linear)      Right Left   Dist cc 20/30 +2 20/25 -2   Dist ph cc 20/25 + 20/20 -2   Correction: Glasses       Tonometry (Tonopen, 2:46 PM)      Right Left   Pressure 12 13       Pupils      Dark Light Shape React APD   Right 3 2 Round Brisk None   Left 3 2 Round Brisk None       Visual Fields (Counting fingers)      Left Right    Full Full       Extraocular Movement      Right Left    Full, Ortho Full, Ortho       Neuro/Psych    Oriented x3: Yes   Mood/Affect: Normal       Dilation    Both eyes: 1.0% Mydriacyl, 2.5% Phenylephrine @ 2:46 PM        Slit Lamp and Fundus Exam    Slit Lamp Exam      Right Left   Lids/Lashes Dermatochalasis - upper lid, Dermatochalasis - lower lid, mild Meibomian gland dysfunction Dermatochalasis - upper lid, Dermatochalasis - lower lid, mild Meibomian gland dysfunction   Conjunctiva/Sclera blue sclera blue sclera   Cornea arcus arcus   Anterior Chamber deep and clear deep and clear   Iris round and dilated, no NVI round and dilated, no NVI   Lens 2+ NS, 2+CS 2+ NS, 2+CS   Vitreous mild syneresis, PVD mild syneresis       Fundus Exam      Right Left   Disc pink and sharp pink and sharp   C/D Ratio 0.3 0.3   Macula Good foveal reflex, persistent, focal edema SN macula--improving, IRH and MA - improving, CWS and exudate -- improved flat, good foveal reflex, mild RPE mottling and clumping, +drusen, No heme or edema   Vessels mild attenuation, +A/V crossing changes, focal BRVO superior macula, mild Tortuousity mild attenuation and tortuosity   Periphery attached, no heme attached, no heme         IMAGING AND PROCEDURES  Imaging and Procedures for _0 @  OCT, Retina - OU - Both Eyes        Right Eye Quality was good. Central Foveal Thickness: 249. Progression  has improved. Findings include no SRF, intraretinal fluid, retinal drusen , normal foveal contour (Mild interval improvement in SN edema/IRF).   Left Eye Quality was good. Central Foveal Thickness: 255. Progression has been stable. Findings include normal foveal contour, no IRF, no SRF, vitreomacular adhesion , retinal drusen .   Notes *Images captured and stored on drive  Diagnosis / Impression:  OD: BRVO with Mild interval improvement in SN edema/IRF OS: NFP, no SRF/IRF  Clinical management:  See below  Abbreviations: NFP - Normal foveal profile. CME - cystoid macular edema. PED - pigment epithelial detachment. IRF - intraretinal fluid. SRF - subretinal fluid. EZ - ellipsoid zone. ERM - epiretinal membrane. ORA - outer retinal atrophy. ORT - outer retinal tubulation. SRHM - subretinal hyper-reflective material         Intravitreal Injection, Pharmacologic Agent - OD - Right Eye       Time Out 10/11/2020. 4:00 PM. Confirmed correct patient, procedure, site, and patient consented.   Anesthesia Topical anesthesia was used. Anesthetic medications included Lidocaine 2%, Proparacaine 0.5%.   Procedure Preparation included 5% betadine to ocular surface, eyelid speculum. A (32g) needle was used.   Injection:  2 mg aflibercept Alfonse Flavors) SOLN   NDC: A3590391, Lot: [id number], Expiration date: 12/17/2020   Route: Intravitreal, Site: Right Eye, Waste: 0.05 mL  Post-op Post injection exam found visual acuity of at least counting fingers. The patient tolerated the procedure well. There were no complications. The patient received written and verbal post procedure care education. Post injection medications were not given.                 ASSESSMENT/PLAN:    ICD-10-CM   1. Branch retinal vein occlusion of right eye with macular edema  H34.8310 Intravitreal Injection, Pharmacologic Agent - OD - Right  Eye    aflibercept (EYLEA) SOLN 2 mg  2. Retinal edema  H35.81 OCT, Retina - OU - Both Eyes  3. Diabetes mellitus type 2 without retinopathy ([place])  E11.9   4. Essential hypertension  I10   5. Hypertensive retinopathy of both eyes  H35.033   6. Combined forms of age-related cataract of both eyes  H25.813    1,2. BRVO with CME OD  - s/p IVA OD #1 (01.19.21), #2 (02.16.21), #3 (03.16.21), #4 (4.13.21) -- IVA resistance             - s/p IVE OD #1 (05.11.21--sample), #2 (06.08.21), #3 (07.06.21), #4 (8.3.21), #5 (08.31.21), #6 (9.28.21)  - BCVA stable at 20/25+ OD  - exam with focal edema, IRH, CWS superonasal macula -- improving  - OCT shows mild interval improvement in IRF/edema SN macula -- non-central  - recommend IVE OD #7 today, (10.26.21)  - RBA of procedure discussed, questions answered  - informed consent obtained  - [place] informed consent form signed and scanned on 01.19.21  - Eylea informed consent form signed and scanned on 05.11.21  - see procedure note  - Eylea4U benefits investigation started, 05.11.21 -- approved through [employer] through 12.31.21  - F/U 4 weeks -- DFE/OCT/possible injection  3. Diabetes mellitus, type 2 without retinopathy OU  - The incidence, risk factors for progression, natural history and treatment options for diabetic retinopathy  were discussed with patient.    - The need for close monitoring of blood glucose, blood pressure, and serum lipids, avoiding cigarette or any type of tobacco, and the need for long term follow up was also discussed with patient.  - f/u in 1 year,  sooner prn  4,5. Hypertensive retinopathy OU  - discussed importance of tight BP control  - monitor  6. Age related cataracts OU   - The symptoms of cataract, surgical options, and treatments and risks were discussed with patient.  - discussed diagnosis and progression  - not yet visually significant  - monitor for now  Ophthalmic Meds Ordered this visit:  Meds ordered  this encounter  Medications  . aflibercept (EYLEA) SOLN 2 mg      Return for 4 weeks DFE, OCT.  There are no Patient Instructions on file for this visit.   Explained the diagnoses, plan, and follow up with the patient and they expressed understanding.  Patient expressed understanding of the importance of proper follow up care.   This document serves as a record of services personally performed by Gardiner Sleeper, MD, PhD. It was created on their behalf by Roselee Nova, COMT. The creation of this record is the provider's dictation and/or activities during the visit.  Electronically signed by: Roselee Nova, COMT 10/11/20 5:19 PM  This document serves as a record of services personally performed by Gardiner Sleeper, MD, PhD. It was created on their behalf by Estill Bakes, COT an ophthalmic technician. The creation of this record is the provider's dictation and/or activities during the visit.    Electronically signed by: Estill Bakes, COT 10.22.21 @ 5:19 PM  Gardiner Sleeper, M.D., Ph.D. Diseases & Surgery of the Retina and [place] 10/11/2020   I have reviewed the above documentation for accuracy and completeness, and I agree with the above. Gardiner Sleeper, M.D., Ph.D. 10/11/20 5:19 PM   Abbreviations: M myopia (nearsighted); A astigmatism; H hyperopia (farsighted); P presbyopia; Mrx spectacle prescription;  CTL contact lenses; OD right eye; OS left eye; OU both eyes  XT exotropia; ET esotropia; PEK punctate epithelial keratitis; PEE punctate epithelial erosions; DES dry eye syndrome; MGD meibomian gland dysfunction; ATs artificial tears; PFAT's preservative free artificial tears; [place] nuclear sclerotic cataract; PSC posterior subcapsular cataract; ERM epi-retinal membrane; PVD posterior vitreous detachment; RD retinal detachment; DM diabetes mellitus; DR diabetic retinopathy; NPDR non-proliferative diabetic retinopathy; PDR proliferative diabetic  retinopathy; CSME clinically significant macular edema; DME diabetic macular edema; dbh dot blot hemorrhages; CWS cotton wool spot; POAG primary open angle glaucoma; C/D cup-to-disc ratio; HVF humphrey visual field; GVF goldmann visual field; OCT optical coherence tomography; IOP intraocular pressure; BRVO Branch retinal vein occlusion; CRVO central retinal vein occlusion; CRAO central retinal artery occlusion; BRAO branch retinal artery occlusion; RT retinal tear; SB scleral buckle; PPV pars plana vitrectomy; VH Vitreous hemorrhage; PRP panretinal laser photocoagulation; IVK intravitreal kenalog; VMT vitreomacular traction; MH Macular hole;  NVD neovascularization of the disc; NVE neovascularization elsewhere; AREDS age related eye disease study; ARMD age related macular degeneration; POAG primary open angle glaucoma; EBMD epithelial/anterior basement membrane dystrophy; ACIOL anterior chamber intraocular lens; IOL intraocular lens; PCIOL posterior chamber intraocular lens; Phaco/IOL phacoemulsification with intraocular lens placement; [place] photorefractive keratectomy; LASIK laser assisted in situ keratomileusis; HTN hypertension; DM diabetes mellitus; COPD chronic obstructive pulmonary disease

## 2020-10-11 ENCOUNTER — Other Ambulatory Visit: Payer: Self-pay

## 2020-10-11 ENCOUNTER — Ambulatory Visit (INDEPENDENT_AMBULATORY_CARE_PROVIDER_SITE_OTHER): Payer: Medicare Other | Admitting: Ophthalmology

## 2020-10-11 ENCOUNTER — Encounter (INDEPENDENT_AMBULATORY_CARE_PROVIDER_SITE_OTHER): Payer: Self-pay | Admitting: Ophthalmology

## 2020-10-11 DIAGNOSIS — H3581 Retinal edema: Secondary | ICD-10-CM

## 2020-10-11 DIAGNOSIS — I10 Essential (primary) hypertension: Secondary | ICD-10-CM | POA: Diagnosis not present

## 2020-10-11 DIAGNOSIS — H35033 Hypertensive retinopathy, bilateral: Secondary | ICD-10-CM

## 2020-10-11 DIAGNOSIS — E119 Type 2 diabetes mellitus without complications: Secondary | ICD-10-CM | POA: Diagnosis not present

## 2020-10-11 DIAGNOSIS — H34831 Tributary (branch) retinal vein occlusion, right eye, with macular edema: Secondary | ICD-10-CM | POA: Diagnosis not present

## 2020-10-11 DIAGNOSIS — H25813 Combined forms of age-related cataract, bilateral: Secondary | ICD-10-CM

## 2020-10-11 MED ORDER — AFLIBERCEPT 2MG/0.05ML IZ SOLN FOR KALEIDOSCOPE
2.0000 mg | INTRAVITREAL | Status: AC | PRN
  Administered 2020-10-11: 2 mg via INTRAVITREAL

## 2020-10-12 ENCOUNTER — Encounter (INDEPENDENT_AMBULATORY_CARE_PROVIDER_SITE_OTHER): Payer: Medicare Other | Admitting: Ophthalmology

## 2020-10-28 ENCOUNTER — Other Ambulatory Visit: Payer: Self-pay | Admitting: Emergency Medicine

## 2020-10-28 ENCOUNTER — Ambulatory Visit (HOSPITAL_COMMUNITY)
Admission: RE | Admit: 2020-10-28 | Discharge: 2020-10-28 | Disposition: A | Discharge status: Home / Self Care | Payer: Medicare Other | Source: Ambulatory Visit | Attending: Emergency Medicine | Admitting: Emergency Medicine

## 2020-10-28 ENCOUNTER — Other Ambulatory Visit: Payer: Self-pay

## 2020-10-28 ENCOUNTER — Other Ambulatory Visit (HOSPITAL_COMMUNITY): Payer: Self-pay | Admitting: Emergency Medicine

## 2020-10-28 DIAGNOSIS — M79605 Pain in left leg: Secondary | ICD-10-CM

## 2020-10-28 DIAGNOSIS — M7989 Other specified soft tissue disorders: Secondary | ICD-10-CM

## 2020-11-07 NOTE — Progress Notes (Signed)
[facility] - Clinic Note  11/08/2020     CHIEF COMPLAINT Patient presents for Retina Follow Up   HISTORY OF PRESENT ILLNESS: Kurt [name] is a 69 y.o. male who presents to the clinic today for:   HPI    Retina Follow Up    Patient presents with  CRVO/BRVO.  In right eye.  Severity is moderate.  Duration of 4 weeks.  Since onset it is stable.  I, the attending physician,  performed the HPI with the patient and updated documentation appropriately.          Comments    Patient states vision the same OU.       Last edited by Rennis ChrisZamora, Sohail Capraro, MD on 11/08/2020  3:25 PM. (History)     Patient states vision the same OU.  Referring physician:  Altamease OilerHarris, Meredith L, FNP [address] [place],  [place] [id number]  HISTORICAL INFORMATION:   Selected notes from the MEDICAL RECORD NUMBER Diabetic Eval per Dr. Karleen HampshireSpencer   CURRENT MEDICATIONS: Current Outpatient Medications (Ophthalmic Drugs)  Medication Sig   Olopatadine HCl 0.7 % SOLN Apply to eye.   No current facility-administered medications for this visit. (Ophthalmic Drugs)   Current Outpatient Medications (Other)  Medication Sig   acetaminophen (TYLENOL) 650 MG CR tablet Take 1,300 mg by mouth every 8 (eight) hours as needed for pain.   aspirin 81 MG chewable tablet Chew 1 tablet (81 mg total) by mouth daily.   glipiZIDE (GLUCOTROL XL) 10 MG 24 hr tablet Take 10 mg by mouth at bedtime.    LANTUS SOLOSTAR 100 UNIT/ML Solostar Pen Inject 33 Units into the skin at bedtime.    lisinopril (PRINIVIL,ZESTRIL) 20 MG tablet Take 20 mg by mouth daily.   methocarbamol (ROBAXIN) 500 MG tablet Take 500 mg by mouth at bedtime.    ONETOUCH ULTRA test strip 1 each 2 (two) times daily.   oxyCODONE (OXY IR/ROXICODONE) 5 MG immediate release tablet Take 1 tablet (5 mg total) by mouth every 8 (eight) hours as needed for moderate pain (pain score 4-6).   simvastatin (ZOCOR) 20 MG tablet Take 20 mg by mouth every  evening.   SitaGLIPtin-MetFORMIN HCl (JANUMET XR) [phone] MG TB24 Take 1 tablet by mouth at bedtime.   No current facility-administered medications for this visit. (Other)   REVIEW OF SYSTEMS: ROS    Positive for: Gastrointestinal, Musculoskeletal, Endocrine, Eyes   Negative for: Constitutional, Neurological, Skin, Genitourinary, HENT, Cardiovascular, Respiratory, Psychiatric, Allergic/Imm, Heme/Lymph   Last edited by Posey BoyerBrown, Amanda J, COT on 11/08/2020  3:22 PM. (History)     ALLERGIES Allergies  Allergen Reactions   Augmentin [Amoxicillin-Pot Clavulanate] Nausea And Vomiting   Iodine Hives   Tape Rash    Paper    PAST MEDICAL HISTORY Past Medical History:  Diagnosis Date   Arthritis    Bronchitis    Bronchitis    Cataract    OU   Concussion    late 1990's  after a fall   Diabetes mellitus without complication (HCC)    GERD (gastroesophageal reflux disease)    History of kidney stones    Hypertension    Hypertensive retinopathy    OU   Osteogenesis imperfecta    PONV (postoperative nausea and vomiting)    pt has post polio syndrome   Post-polio syndrome    Past Surgical History:  Procedure Laterality Date   ANKLE FRACTURE SURGERY Bilateral    arm surgery Right  nerve surgery   COLONOSCOPY     ELBOW FRACTURE SURGERY Left    EYE MUSCLE SURGERY Left    LEG SURGERY Left    femur fracture with rod   REVERSE SHOULDER ARTHROPLASTY Right 11/10/2019   Procedure: RIGHT REVERSE SHOULDER ARTHROPLASTY;  Surgeon: Cammy Copa, MD;  Location: [facility];  Service: Orthopedics;  Laterality: Right;   TONSILLECTOMY     FAMILY HISTORY Family History  Problem Relation Age of Onset   Hypertension Mother    Diabetes Brother    SOCIAL HISTORY Social History   Tobacco Use   Smoking status: Never Smoker   Smokeless tobacco: Never Used  Building services engineer Use: Never used  Substance Use Topics   Alcohol use: Yes    Comment: 1 beer  occasionally   Drug use: No         OPHTHALMIC EXAM:  Base Eye Exam    Visual Acuity (Snellen - Linear)      Right Left   Dist cc 20/25 -2 20/25   Dist ph cc 20/20 -2 20/25 +2       Tonometry (Tonopen, 3:25 PM)      Right Left   Pressure 14 12       Pupils      Dark Light Shape React APD   Right 3 2 Round Brisk None   Left 3 2 Round Brisk None       Visual Fields (Counting fingers)      Left Right    Full Full       Extraocular Movement      Right Left    Full, Ortho Full, Ortho       Neuro/Psych    Oriented x3: Yes   Mood/Affect: Normal       Dilation    Both eyes: 1.0% Mydriacyl, 2.5% Phenylephrine @ 3:15 PM        Slit Lamp and Fundus Exam    Slit Lamp Exam      Right Left   Lids/Lashes Dermatochalasis - upper lid, Dermatochalasis - lower lid, mild Meibomian gland dysfunction Dermatochalasis - upper lid, Dermatochalasis - lower lid, mild Meibomian gland dysfunction   Conjunctiva/Sclera blue sclera blue sclera   Cornea arcus arcus   Anterior Chamber deep and clear deep and clear   Iris round and dilated, no NVI round and dilated, no NVI   Lens 2+ NS, 2+CS 2+ NS, 2+CS   Vitreous mild syneresis, PVD mild syneresis       Fundus Exam      Right Left   Disc pink and sharp pink and sharp   C/D Ratio 0.3 0.3   Macula Good foveal reflex, persistent, focal edema SN macula--improving, IRH and MA - improving, CWS and exudate -- improved flat, good foveal reflex, mild RPE mottling and clumping, +drusen, No heme or edema   Vessels mild attenuation, +A/V crossing changes, focal BRVO superior macula, mild Tortuousity mild attenuation and tortuosity   Periphery attached, no heme attached, no heme        Refraction    Wearing Rx      Sphere Cylinder Axis Add   Right -2.00 +2.25 111 +2.75   Left -1.50 +1.50 094 2.75         IMAGING AND PROCEDURES  Imaging and Procedures for @TODAY @  OCT, Retina - OU - Both Eyes       Right Eye Quality was good.  Central Foveal Thickness: 245. Progression has improved. Findings  include no SRF, intraretinal fluid, retinal drusen , normal foveal contour (Mild interval improvement in SN edema/IRF).   Left Eye Quality was good. Central Foveal Thickness: 260. Progression has been stable. Findings include normal foveal contour, no IRF, no SRF, vitreomacular adhesion , retinal drusen .   Notes *Images captured and stored on drive  Diagnosis / Impression:  OD: BRVO with Mild interval improvement in SN edema/IRF OS: NFP, no SRF/IRF  Clinical management:  See below  Abbreviations: NFP - Normal foveal profile. CME - cystoid macular edema. PED - pigment epithelial detachment. IRF - intraretinal fluid. SRF - subretinal fluid. EZ - ellipsoid zone. ERM - epiretinal membrane. ORA - outer retinal atrophy. ORT - outer retinal tubulation. SRHM - subretinal hyper-reflective material         Intravitreal Injection, Pharmacologic Agent - OD - Right Eye       Time Out 11/08/2020. 3:38 PM. Confirmed correct patient, procedure, site, and patient consented.   Anesthesia Topical anesthesia was used. Anesthetic medications included Lidocaine 2%, Proparacaine 0.5%.   Procedure Preparation included 5% betadine to ocular surface, eyelid speculum. A (32 g) needle was used.   Injection:  2 mg aflibercept Gretta Cool) SOLN   NDC: L6038910, Lot: [id number], Expiration date: 02/13/2021   Route: Intravitreal, Site: Right Eye, Waste: 0.05 mL                 ASSESSMENT/PLAN:    ICD-10-CM   1. Branch retinal vein occlusion of right eye with macular edema  H34.8310 Intravitreal Injection, Pharmacologic Agent - OD - Right Eye    aflibercept (EYLEA) SOLN 2 mg  2. Retinal edema  H35.81 OCT, Retina - OU - Both Eyes  3. Diabetes mellitus type 2 without retinopathy (HCC)  E11.9   4. Essential hypertension  I10   5. Hypertensive retinopathy of both eyes  H35.033   6. Combined forms of age-related cataract of both eyes   H25.813    1,2. BRVO with CME OD  - s/p IVA OD #1 (01.19.21), #2 (02.16.21), #3 (03.16.21), #4 (4.13.21) -- IVA resistance             - s/p IVE OD #1 (05.11.21--sample), #2 (06.08.21), #3 (07.06.21), #4 (8.3.21), #5 (08.31.21), #6 (09.28.21), #7 (10.26.21)  - BCVA improved to 20/20-2 OD  - exam with focal edema, IRH, CWS superonasal macula -- improving  - OCT shows mild interval improvement in IRF/edema SN macula -- non-central  - recommend IVE OD #8 today, (11.23.21)  - RBA of procedure discussed, questions answered  - informed consent obtained  - Avastin informed consent form signed and scanned on 01.19.21  - Eylea informed consent form signed and scanned on 05.11.21  - see procedure note  - Eylea4U benefits investigation started, 05.11.21 -- approved through [employer] through 12.31.21  - patient states that he has obtained approval for [facility] 2022  - F/U 4 weeks -- DFE/OCT/possible injection  3. Diabetes mellitus, type 2 without retinopathy OU  - The incidence, risk factors for progression, natural history and treatment options for diabetic retinopathy  were discussed with patient.    - The need for close monitoring of blood glucose, blood pressure, and serum lipids, avoiding cigarette or any type of tobacco, and the need for long term follow up was also discussed with patient.  - f/u in 1 year, sooner prn  4,5. Hypertensive retinopathy OU  - discussed importance of tight BP control  - monitor  6. Age related cataracts OU   -  The symptoms of cataract, surgical options, and treatments and risks were discussed with patient.  - discussed diagnosis and progression  - not yet visually significant  - monitor for now  Ophthalmic Meds Ordered this visit:  Meds ordered this encounter  Medications   aflibercept (EYLEA) SOLN 2 mg      Return in 4 weeks (on 12/06/2020) for BRVO w/ CME OD, Dilated Exam, OCT, Possible Injxn.  There are no Patient Instructions on file for this  visit.   Explained the diagnoses, plan, and follow up with the patient and they expressed understanding.  Patient expressed understanding of the importance of proper follow up care.   This document serves as a record of services personally performed by Karie Chimera, MD, PhD. It was created on their behalf by Annalee Genta, COMT. The creation of this record is the provider's dictation and/or activities during the visit.  Electronically signed by: Annalee Genta, COMT 11/08/20 5:14 PM  Karie Chimera, M.D., Ph.D. Diseases & Surgery of the Retina and Vitreous [facility]  I have reviewed the above documentation for accuracy and completeness, and I agree with the above. Karie Chimera, M.D., Ph.D. 11/08/20 5:14 PM   Abbreviations: M myopia (nearsighted); A astigmatism; H hyperopia (farsighted); P presbyopia; Mrx spectacle prescription;  CTL contact lenses; OD right eye; OS left eye; OU both eyes  XT exotropia; ET esotropia; PEK punctate epithelial keratitis; PEE punctate epithelial erosions; DES dry eye syndrome; MGD meibomian gland dysfunction; ATs artificial tears; PFAT's preservative free artificial tears; NSC nuclear sclerotic cataract; PSC posterior subcapsular cataract; ERM epi-retinal membrane; PVD posterior vitreous detachment; RD retinal detachment; DM diabetes mellitus; DR diabetic retinopathy; NPDR non-proliferative diabetic retinopathy; PDR proliferative diabetic retinopathy; CSME clinically significant macular edema; DME diabetic macular edema; dbh dot blot hemorrhages; CWS cotton wool spot; POAG primary open angle glaucoma; C/D cup-to-disc ratio; HVF humphrey visual field; GVF goldmann visual field; OCT optical coherence tomography; IOP intraocular pressure; BRVO Branch retinal vein occlusion; CRVO central retinal vein occlusion; CRAO central retinal artery occlusion; BRAO branch retinal artery occlusion; RT retinal tear; SB scleral buckle; PPV pars plana  vitrectomy; VH Vitreous hemorrhage; PRP panretinal laser photocoagulation; IVK intravitreal kenalog; VMT vitreomacular traction; MH Macular hole;  NVD neovascularization of the disc; NVE neovascularization elsewhere; AREDS age related eye disease study; ARMD age related macular degeneration; POAG primary open angle glaucoma; EBMD epithelial/anterior basement membrane dystrophy; ACIOL anterior chamber intraocular lens; IOL intraocular lens; PCIOL posterior chamber intraocular lens; Phaco/IOL phacoemulsification with intraocular lens placement; PRK photorefractive keratectomy; LASIK laser assisted in situ keratomileusis; HTN hypertension; DM diabetes mellitus; COPD chronic obstructive pulmonary disease

## 2020-11-08 ENCOUNTER — Other Ambulatory Visit: Payer: Self-pay

## 2020-11-08 ENCOUNTER — Ambulatory Visit (INDEPENDENT_AMBULATORY_CARE_PROVIDER_SITE_OTHER): Payer: Medicare Other | Admitting: Ophthalmology

## 2020-11-08 ENCOUNTER — Encounter (INDEPENDENT_AMBULATORY_CARE_PROVIDER_SITE_OTHER): Payer: Self-pay | Admitting: Ophthalmology

## 2020-11-08 DIAGNOSIS — E119 Type 2 diabetes mellitus without complications: Secondary | ICD-10-CM | POA: Diagnosis not present

## 2020-11-08 DIAGNOSIS — H3581 Retinal edema: Secondary | ICD-10-CM | POA: Diagnosis not present

## 2020-11-08 DIAGNOSIS — H35033 Hypertensive retinopathy, bilateral: Secondary | ICD-10-CM

## 2020-11-08 DIAGNOSIS — I10 Essential (primary) hypertension: Secondary | ICD-10-CM | POA: Diagnosis not present

## 2020-11-08 DIAGNOSIS — H34831 Tributary (branch) retinal vein occlusion, right eye, with macular edema: Secondary | ICD-10-CM | POA: Diagnosis not present

## 2020-11-08 DIAGNOSIS — H25813 Combined forms of age-related cataract, bilateral: Secondary | ICD-10-CM

## 2020-11-08 MED ORDER — AFLIBERCEPT 2MG/0.05ML IZ SOLN FOR KALEIDOSCOPE
2.0000 mg | INTRAVITREAL | Status: AC | PRN
  Administered 2020-11-08: 2 mg via INTRAVITREAL

## 2020-12-06 NOTE — Progress Notes (Addendum)
[facility] - Clinic Note  12/07/2020     CHIEF COMPLAINT Patient presents for Retina Follow Up   HISTORY OF PRESENT ILLNESS: Kurt [name] is a 69 y.o. male who presents to the clinic today for:   HPI    Retina Follow Up    Patient presents with  CRVO/BRVO.  In right eye.  Duration of 4 weeks.  Since onset it is stable.  I, the attending physician,  performed the HPI with the patient and updated documentation appropriately.          Comments    4 week follow up BRVO OD- Doing well.  Dr. Karleen HampshireSpencer gave him a new glasses Rx yesterday, he said it will give him 20/20 vision.  He wants to make sure Dr. Vanessa BarbaraZamora agrees for him to update glasses at this point.  BS 157 A1C 7.4       Last edited by Rennis ChrisZamora, Jeri Jeanbaptiste, MD on 12/08/2020 12:57 AM. (History)    Patient states he saw Dr. Karleen HampshireSpencer yesterday who was very pleased with how his eyes look this year, Dr. Karleen HampshireSpencer gave him a new glasses rx, pt wanted to make sure Dr. Vanessa BarbaraZamora was okay with him getting new glasses before he got the rx filled  Referring physician:  Aura CampsSpencer, Michael, MD [address] [place] [id number]  HISTORICAL INFORMATION:   Selected notes from the MEDICAL RECORD NUMBER Diabetic Eval per Dr. Karleen HampshireSpencer   CURRENT MEDICATIONS: Current Outpatient Medications (Ophthalmic Drugs)  Medication Sig  . Olopatadine HCl 0.7 % SOLN Apply to eye.   No current facility-administered medications for this visit. (Ophthalmic Drugs)   Current Outpatient Medications (Other)  Medication Sig  . acetaminophen (TYLENOL) 650 MG CR tablet Take 1,300 mg by mouth every 8 (eight) hours as needed for pain.  Marland Kitchen. aspirin 81 MG chewable tablet Chew 1 tablet (81 mg total) by mouth daily.  Marland Kitchen. glipiZIDE (GLUCOTROL XL) 10 MG 24 hr tablet Take 10 mg by mouth at bedtime.   Marland Kitchen. LANTUS SOLOSTAR 100 UNIT/ML Solostar Pen Inject 33 Units into the skin at bedtime.   Marland Kitchen. lisinopril (PRINIVIL,ZESTRIL) 20 MG tablet Take 20 mg  by mouth daily.  . methocarbamol (ROBAXIN) 500 MG tablet Take 500 mg by mouth at bedtime.   Marland Kitchen. oxyCODONE (OXY IR/ROXICODONE) 5 MG immediate release tablet Take 1 tablet (5 mg total) by mouth every 8 (eight) hours as needed for moderate pain (pain score 4-6).  Marland Kitchen. simvastatin (ZOCOR) 20 MG tablet Take 20 mg by mouth every evening.  . SitaGLIPtin-MetFORMIN HCl [phone] MG TB24 Take 1 tablet by mouth at bedtime.  Letta Pate. ONETOUCH ULTRA test strip 1 each 2 (two) times daily.   No current facility-administered medications for this visit. (Other)   REVIEW OF SYSTEMS: ROS    Positive for: Gastrointestinal, Musculoskeletal, Endocrine, Eyes   Negative for: Constitutional, Neurological, Skin, Genitourinary, HENT, Cardiovascular, Respiratory, Psychiatric, Allergic/Imm, Heme/Lymph   Last edited by Joni ReiningHodges, Robin, COA on 12/07/2020  2:12 PM. (History)     ALLERGIES Allergies  Allergen Reactions  . Augmentin [Amoxicillin-Pot Clavulanate] Nausea And Vomiting  . Iodine Hives  . Tape Rash    Paper    PAST MEDICAL HISTORY Past Medical History:  Diagnosis Date  . Arthritis   . Bronchitis   . Bronchitis   . Cataract    OU  . Concussion    late 1990's  after a fall  . Diabetes mellitus without complication (HCC)   .  GERD (gastroesophageal reflux disease)   . History of kidney stones   . Hypertension   . Hypertensive retinopathy    OU  . Osteogenesis imperfecta   . PONV (postoperative nausea and vomiting)    pt has post polio syndrome  . Post-polio syndrome    Past Surgical History:  Procedure Laterality Date  . ANKLE FRACTURE SURGERY Bilateral   . arm surgery Right    nerve surgery  . COLONOSCOPY    . ELBOW FRACTURE SURGERY Left   . EYE MUSCLE SURGERY Left   . LEG SURGERY Left    femur fracture with rod  . REVERSE SHOULDER ARTHROPLASTY Right 11/10/2019   Procedure: RIGHT REVERSE SHOULDER ARTHROPLASTY;  Surgeon: Cammy Copa, MD;  Location: [facility] OR;  Service: Orthopedics;  Laterality:  Right;  . TONSILLECTOMY     FAMILY HISTORY Family History  Problem Relation Age of Onset  . Hypertension Mother   . Diabetes Brother    SOCIAL HISTORY Social History   Tobacco Use  . Smoking status: Never Smoker  . Smokeless tobacco: Never Used  Vaping Use  . Vaping Use: Never used  Substance Use Topics  . Alcohol use: Yes    Comment: 1 beer occasionally  . Drug use: No         OPHTHALMIC EXAM:  Base Eye Exam    Visual Acuity (Snellen - Linear)      Right Left   Dist cc 20/25 -2 20/20 -2   Dist ph cc 20/20 -2        Tonometry (Tonopen, 2:20 PM)      Right Left   Pressure 11 12       Pupils      Dark Light Shape React APD   Right 3 2 Round Brisk None   Left 3 2 Round Brisk None       Visual Fields (Counting fingers)      Left Right    Full Full       Extraocular Movement      Right Left    Full Full       Neuro/Psych    Oriented x3: Yes   Mood/Affect: Normal       Dilation    Both eyes: 1.0% Mydriacyl, 2.5% Phenylephrine @ 2:20 PM        Slit Lamp and Fundus Exam    Slit Lamp Exam      Right Left   Lids/Lashes Dermatochalasis - upper lid, Dermatochalasis - lower lid, mild Meibomian gland dysfunction Dermatochalasis - upper lid, Dermatochalasis - lower lid, mild Meibomian gland dysfunction   Conjunctiva/Sclera blue sclera blue sclera   Cornea arcus arcus   Anterior Chamber deep and clear deep and clear   Iris round and dilated, no NVI round and dilated, no NVI   Lens 2+ NS, 2+CS 2+ NS, 2+CS   Vitreous mild syneresis, PVD mild syneresis       Fundus Exam      Right Left   Disc pink and sharp pink and sharp   C/D Ratio 0.3 0.3   Macula Good foveal reflex, persistent, focal edema SN macula--improving, IRH and MA - improving, CWS and exudate -- improved flat, good foveal reflex, mild RPE mottling and clumping, +drusen, No heme or edema   Vessels mild attenuation, +A/V crossing changes, focal BRVO superior macula, mild Tortuousity mild  attenuation and tortuosity   Periphery attached, no heme attached, no heme        Refraction  Wearing Rx      Sphere Cylinder Axis Add   Right -2.00 +2.25 111 +2.75   Left -1.50 +1.50 094 2.75         IMAGING AND PROCEDURES  Imaging and Procedures for @TODAY @  OCT, Retina - OU - Both Eyes       Right Eye Quality was good. Central Foveal Thickness: 239. Progression has improved. Findings include no SRF, intraretinal fluid, retinal drusen , normal foveal contour (interval improvement in SN edema/IRF).   Left Eye Quality was good. Central Foveal Thickness: 255. Progression has been stable. Findings include normal foveal contour, no IRF, no SRF, vitreomacular adhesion , retinal drusen .   Notes *Images captured and stored on drive  Diagnosis / Impression:  OD: BRVO with interval improvement in SN edema/IRF OS: NFP, no SRF/IRF  Clinical management:  See below  Abbreviations: NFP - Normal foveal profile. CME - cystoid macular edema. PED - pigment epithelial detachment. IRF - intraretinal fluid. SRF - subretinal fluid. EZ - ellipsoid zone. ERM - epiretinal membrane. ORA - outer retinal atrophy. ORT - outer retinal tubulation. SRHM - subretinal hyper-reflective material         Intravitreal Injection, Pharmacologic Agent - OD - Right Eye       Time Out 12/07/2020. 3:06 PM. Confirmed correct patient, procedure, site, and patient consented.   Anesthesia Topical anesthesia was used. Anesthetic medications included Lidocaine 2%, Proparacaine 0.5%.   Procedure Preparation included 5% betadine to ocular surface, eyelid speculum. A (32 g) needle was used.   Injection:  2 mg aflibercept 12/09/2020) SOLN   NDC: Gretta Cool, Lot: [id number], Expiration date: 02/14/2021   Route: Intravitreal, Site: Right Eye, Waste: 0.05 mL  Post-op Post injection exam found visual acuity of at least counting fingers. The patient tolerated the procedure well. There were no complications. The  patient received written and verbal post procedure care education.                 ASSESSMENT/PLAN:    ICD-10-CM   1. Branch retinal vein occlusion of right eye with macular edema  H34.8310 Intravitreal Injection, Pharmacologic Agent - OD - Right Eye    aflibercept (EYLEA) SOLN 2 mg  2. Retinal edema  H35.81 OCT, Retina - OU - Both Eyes  3. Diabetes mellitus type 2 without retinopathy (HCC)  E11.9   4. Essential hypertension  I10   5. Hypertensive retinopathy of both eyes  H35.033   6. Combined forms of age-related cataract of both eyes  H25.813    1,2. BRVO with CME OD  - s/p IVA OD #1 (01.19.21), #2 (02.16.21), #3 (03.16.21), #4 (4.13.21) -- IVA resistance             - s/p IVE OD #1 (05.11.21--sample), #2 (06.08.21), #3 (07.06.21), #4 (8.3.21), #5 (08.31.21), #6 (09.28.21), #7 (10.26.21), #8 (11.23.21)  - BCVA improved to 20/20-2 OD  - exam with focal edema, IRH, CWS superonasal macula -- improving  - OCT shows mild interval improvement in IRF/edema SN macula -- non-central  - recommend IVE OD #9 today, (12.22.21)  - RBA of procedure discussed, questions answered  - informed consent obtained  - Avastin informed consent form signed and scanned on 01.19.21  - Eylea informed consent form signed and scanned on 05.11.21  - see procedure note  - Eylea4U benefits investigation started, 05.11.21 -- approved through [employer] through 12.31.21  - patient states that he has obtained approval for [facility] 2022  - F/U 4 weeks --  DFE/OCT/possible injection  **clear from retina standpoint to update MRx**  3. Diabetes mellitus, type 2 without retinopathy OU  - The incidence, risk factors for progression, natural history and treatment options for diabetic retinopathy  were discussed with patient.    - The need for close monitoring of blood glucose, blood pressure, and serum lipids, avoiding cigarette or any type of tobacco, and the need for long term follow up was also discussed with  patient.  - f/u in 1 year, sooner prn  4,5. Hypertensive retinopathy OU  - discussed importance of tight BP control  - monitor  6. Age related cataracts OU   - The symptoms of cataract, surgical options, and treatments and risks were discussed with patient.  - discussed diagnosis and progression  - not yet visually significant  - monitor for now  Ophthalmic Meds Ordered this visit:  Meds ordered this encounter  Medications  . aflibercept (EYLEA) SOLN 2 mg      Return in about 4 weeks (around 01/04/2021) for f/u BRVO OD, DFE, OCT.  There are no Patient Instructions on file for this visit.   Explained the diagnoses, plan, and follow up with the patient and they expressed understanding.  Patient expressed understanding of the importance of proper follow up care.   This document serves as a record of services personally performed by Karie Chimera, MD, PhD. It was created on their behalf by Annalee Genta, COMT. The creation of this record is the provider's dictation and/or activities during the visit.  Electronically signed by: Annalee Genta, COMT 12/08/20 1:01 AM   This document serves as a record of services personally performed by Karie Chimera, MD, PhD. It was created on their behalf by Glee Arvin. Manson Passey, OA an ophthalmic technician. The creation of this record is the provider's dictation and/or activities during the visit.    Electronically signed by: Glee Arvin. Manson Passey, [place] 12.22.2021 1:01 AM   Karie Chimera, M.D., Ph.D. Diseases & Surgery of the Retina and Vitreous [facility]  I have reviewed the above documentation for accuracy and completeness, and I agree with the above. Karie Chimera, M.D., Ph.D. 12/08/20 1:01 AM   Abbreviations: M myopia (nearsighted); A astigmatism; H hyperopia (farsighted); P presbyopia; Mrx spectacle prescription;  CTL contact lenses; OD right eye; OS left eye; OU both eyes  XT exotropia; ET esotropia; PEK punctate  epithelial keratitis; PEE punctate epithelial erosions; DES dry eye syndrome; MGD meibomian gland dysfunction; ATs artificial tears; PFAT's preservative free artificial tears; NSC nuclear sclerotic cataract; PSC posterior subcapsular cataract; ERM epi-retinal membrane; PVD posterior vitreous detachment; RD retinal detachment; DM diabetes mellitus; DR diabetic retinopathy; NPDR non-proliferative diabetic retinopathy; PDR proliferative diabetic retinopathy; CSME clinically significant macular edema; DME diabetic macular edema; dbh dot blot hemorrhages; CWS cotton wool spot; POAG primary open angle glaucoma; C/D cup-to-disc ratio; HVF humphrey visual field; GVF goldmann visual field; OCT optical coherence tomography; IOP intraocular pressure; BRVO Branch retinal vein occlusion; CRVO central retinal vein occlusion; CRAO central retinal artery occlusion; BRAO branch retinal artery occlusion; RT retinal tear; SB scleral buckle; PPV pars plana vitrectomy; VH Vitreous hemorrhage; PRP panretinal laser photocoagulation; IVK intravitreal kenalog; VMT vitreomacular traction; MH Macular hole;  NVD neovascularization of the disc; NVE neovascularization elsewhere; AREDS age related eye disease study; ARMD age related macular degeneration; POAG primary open angle glaucoma; EBMD epithelial/anterior basement membrane dystrophy; ACIOL anterior chamber intraocular lens; IOL intraocular lens; PCIOL posterior chamber intraocular lens; Phaco/IOL phacoemulsification with intraocular  lens placement; [place] photorefractive keratectomy; LASIK laser assisted in situ keratomileusis; HTN hypertension; DM diabetes mellitus; COPD chronic obstructive pulmonary disease

## 2020-12-07 ENCOUNTER — Other Ambulatory Visit: Payer: Self-pay

## 2020-12-07 ENCOUNTER — Ambulatory Visit (INDEPENDENT_AMBULATORY_CARE_PROVIDER_SITE_OTHER): Payer: Medicare Other | Admitting: Ophthalmology

## 2020-12-07 DIAGNOSIS — H34831 Tributary (branch) retinal vein occlusion, right eye, with macular edema: Secondary | ICD-10-CM

## 2020-12-07 DIAGNOSIS — E119 Type 2 diabetes mellitus without complications: Secondary | ICD-10-CM

## 2020-12-07 DIAGNOSIS — H25813 Combined forms of age-related cataract, bilateral: Secondary | ICD-10-CM

## 2020-12-07 DIAGNOSIS — I10 Essential (primary) hypertension: Secondary | ICD-10-CM

## 2020-12-07 DIAGNOSIS — H35033 Hypertensive retinopathy, bilateral: Secondary | ICD-10-CM

## 2020-12-07 DIAGNOSIS — H3581 Retinal edema: Secondary | ICD-10-CM

## 2020-12-08 ENCOUNTER — Encounter (INDEPENDENT_AMBULATORY_CARE_PROVIDER_SITE_OTHER): Payer: Self-pay | Admitting: Ophthalmology

## 2020-12-08 MED ORDER — AFLIBERCEPT 2MG/0.05ML IZ SOLN FOR KALEIDOSCOPE
2.0000 mg | INTRAVITREAL | Status: AC | PRN
  Administered 2020-12-08: 01:00:00 2 mg via INTRAVITREAL

## 2020-12-13 ENCOUNTER — Other Ambulatory Visit: Payer: Self-pay

## 2020-12-13 ENCOUNTER — Encounter (HOSPITAL_COMMUNITY): Payer: Self-pay | Admitting: Emergency Medicine

## 2020-12-13 DIAGNOSIS — Z20822 Contact with and (suspected) exposure to covid-19: Secondary | ICD-10-CM | POA: Diagnosis present

## 2020-12-13 DIAGNOSIS — Z7982 Long term (current) use of aspirin: Secondary | ICD-10-CM

## 2020-12-13 DIAGNOSIS — I10 Essential (primary) hypertension: Secondary | ICD-10-CM | POA: Diagnosis present

## 2020-12-13 DIAGNOSIS — G8191 Hemiplegia, unspecified affecting right dominant side: Secondary | ICD-10-CM | POA: Diagnosis present

## 2020-12-13 DIAGNOSIS — Z7984 Long term (current) use of oral hypoglycemic drugs: Secondary | ICD-10-CM

## 2020-12-13 DIAGNOSIS — E119 Type 2 diabetes mellitus without complications: Secondary | ICD-10-CM | POA: Diagnosis present

## 2020-12-13 DIAGNOSIS — I890 Lymphedema, not elsewhere classified: Secondary | ICD-10-CM | POA: Diagnosis present

## 2020-12-13 DIAGNOSIS — L02414 Cutaneous abscess of left upper limb: Secondary | ICD-10-CM | POA: Diagnosis present

## 2020-12-13 DIAGNOSIS — L03116 Cellulitis of left lower limb: Secondary | ICD-10-CM | POA: Diagnosis not present

## 2020-12-13 DIAGNOSIS — Q780 Osteogenesis imperfecta: Secondary | ICD-10-CM

## 2020-12-13 DIAGNOSIS — G14 Postpolio syndrome: Secondary | ICD-10-CM | POA: Diagnosis present

## 2020-12-13 DIAGNOSIS — Z87442 Personal history of urinary calculi: Secondary | ICD-10-CM

## 2020-12-13 DIAGNOSIS — Z833 Family history of diabetes mellitus: Secondary | ICD-10-CM

## 2020-12-13 DIAGNOSIS — Z96611 Presence of right artificial shoulder joint: Secondary | ICD-10-CM | POA: Diagnosis present

## 2020-12-13 DIAGNOSIS — K219 Gastro-esophageal reflux disease without esophagitis: Secondary | ICD-10-CM | POA: Diagnosis present

## 2020-12-13 DIAGNOSIS — Z794 Long term (current) use of insulin: Secondary | ICD-10-CM

## 2020-12-13 DIAGNOSIS — Z880 Allergy status to penicillin: Secondary | ICD-10-CM

## 2020-12-13 DIAGNOSIS — L03115 Cellulitis of right lower limb: Secondary | ICD-10-CM | POA: Diagnosis present

## 2020-12-13 DIAGNOSIS — Z8249 Family history of ischemic heart disease and other diseases of the circulatory system: Secondary | ICD-10-CM

## 2020-12-13 DIAGNOSIS — Z79899 Other long term (current) drug therapy: Secondary | ICD-10-CM

## 2020-12-13 NOTE — ED Triage Notes (Signed)
Pt to the ed for eval of left ankle pain. Pt states he has cellulitis on his lower left leg.  Patient's lower leg is red and weeping.

## 2020-12-14 ENCOUNTER — Other Ambulatory Visit: Payer: Self-pay

## 2020-12-14 ENCOUNTER — Inpatient Hospital Stay (HOSPITAL_COMMUNITY)
Admission: EM | Admit: 2020-12-14 | Discharge: 2020-12-17 | DRG: 603 | Disposition: A | Discharge status: Home Health | Payer: Medicare Other | Attending: Internal Medicine | Admitting: Internal Medicine

## 2020-12-14 ENCOUNTER — Emergency Department (HOSPITAL_COMMUNITY): Payer: Medicare Other

## 2020-12-14 ENCOUNTER — Encounter (HOSPITAL_COMMUNITY): Payer: Self-pay | Admitting: Internal Medicine

## 2020-12-14 DIAGNOSIS — G14 Postpolio syndrome: Secondary | ICD-10-CM

## 2020-12-14 DIAGNOSIS — I10 Essential (primary) hypertension: Secondary | ICD-10-CM | POA: Diagnosis present

## 2020-12-14 DIAGNOSIS — L02419 Cutaneous abscess of limb, unspecified: Secondary | ICD-10-CM | POA: Diagnosis present

## 2020-12-14 DIAGNOSIS — E119 Type 2 diabetes mellitus without complications: Secondary | ICD-10-CM

## 2020-12-14 DIAGNOSIS — L03116 Cellulitis of left lower limb: Secondary | ICD-10-CM | POA: Diagnosis present

## 2020-12-14 DIAGNOSIS — E1165 Type 2 diabetes mellitus with hyperglycemia: Secondary | ICD-10-CM

## 2020-12-14 DIAGNOSIS — Q780 Osteogenesis imperfecta: Secondary | ICD-10-CM

## 2020-12-14 LAB — CBC WITH DIFFERENTIAL/PLATELET
Abs Immature Granulocytes: 0.06 10*3/uL (ref 0.00–0.07)
Basophils Absolute: 0.1 10*3/uL (ref 0.0–0.1)
Basophils Relative: 1 %
Eosinophils Absolute: 0.3 10*3/uL (ref 0.0–0.5)
Eosinophils Relative: 2 %
HCT: 49.8 % (ref 39.0–52.0)
Hemoglobin: 15 g/dL (ref 13.0–17.0)
Immature Granulocytes: 1 %
Lymphocytes Relative: 35 %
Lymphs Abs: 3.9 10*3/uL (ref 0.7–4.0)
MCH: 28.5 pg (ref 26.0–34.0)
MCHC: 30.1 g/dL (ref 30.0–36.0)
MCV: 94.7 fL (ref 80.0–100.0)
Monocytes Absolute: 0.9 10*3/uL (ref 0.1–1.0)
Monocytes Relative: 8 %
Neutro Abs: 6.1 10*3/uL (ref 1.7–7.7)
Neutrophils Relative %: 53 %
Platelets: 321 10*3/uL (ref 150–400)
RBC: 5.26 MIL/uL (ref 4.22–5.81)
RDW: 15.3 % (ref 11.5–15.5)
WBC: 11.3 10*3/uL — ABNORMAL HIGH (ref 4.0–10.5)
nRBC: 0 % (ref 0.0–0.2)

## 2020-12-14 LAB — COMPREHENSIVE METABOLIC PANEL
ALT: 24 U/L (ref 0–44)
AST: 18 U/L (ref 15–41)
Albumin: 4.3 g/dL (ref 3.5–5.0)
Alkaline Phosphatase: 65 U/L (ref 38–126)
Anion gap: 10 (ref 5–15)
BUN: 13 mg/dL (ref 8–23)
CO2: 22 mmol/L (ref 22–32)
Calcium: 9.3 mg/dL (ref 8.9–10.3)
Chloride: 104 mmol/L (ref 98–111)
Creatinine, Ser: 0.88 mg/dL (ref 0.61–1.24)
GFR, Estimated: >60 mL/min (ref >60)
Glucose, Bld: 117 mg/dL — ABNORMAL HIGH (ref 70–99)
Potassium: 4 mmol/L (ref 3.5–5.1)
Sodium: 136 mmol/L (ref 135–145)
Total Bilirubin: 0.9 mg/dL (ref 0.3–1.2)
Total Protein: 7.4 g/dL (ref 6.5–8.1)

## 2020-12-14 LAB — CBG MONITORING, ED
Glucose-Capillary: 154 mg/dL — ABNORMAL HIGH (ref 70–99)
Glucose-Capillary: 270 mg/dL — ABNORMAL HIGH (ref 70–99)

## 2020-12-14 LAB — RESP PANEL BY RT-PCR (FLU A&B, COVID) ARPGX2
Influenza A by PCR: NEGATIVE
Influenza B by PCR: NEGATIVE
SARS Coronavirus 2 by RT PCR: NEGATIVE

## 2020-12-14 LAB — GLUCOSE, CAPILLARY
Glucose-Capillary: 184 mg/dL — ABNORMAL HIGH (ref 70–99)
Glucose-Capillary: 172 mg/dL — ABNORMAL HIGH (ref 70–99)

## 2020-12-14 LAB — LACTIC ACID, PLASMA
Lactic Acid, Venous: 1.6 mmol/L (ref 0.5–1.9)
Lactic Acid, Venous: 1.5 mmol/L (ref 0.5–1.9)

## 2020-12-14 MED ORDER — METFORMIN HCL ER 500 MG PO TB24
1000.0000 mg | ORAL_TABLET | Freq: Every day | ORAL | Status: DC
  Administered 2020-12-14 – 2020-12-16 (×3): 1000 mg via ORAL
  Filled 2020-12-14 (×5): qty 2

## 2020-12-14 MED ORDER — ACETAMINOPHEN 325 MG PO TABS: 650.0000 mg | ORAL_TABLET | Freq: Four times a day (QID) | ORAL | Status: DC | PRN

## 2020-12-14 MED ORDER — INSULIN ASPART 100 UNIT/ML ~~LOC~~ SOLN
0.0000 [IU] | Freq: Three times a day (TID) | SUBCUTANEOUS | Status: DC
  Administered 2020-12-14: 13:00:00 8 [IU] via SUBCUTANEOUS
  Administered 2020-12-14 (×2): 3 [IU] via SUBCUTANEOUS
  Administered 2020-12-15: 13:00:00 5 [IU] via SUBCUTANEOUS
  Administered 2020-12-15: 18:00:00 2 [IU] via SUBCUTANEOUS
  Administered 2020-12-15: 09:00:00 3 [IU] via SUBCUTANEOUS
  Administered 2020-12-16: 8 [IU] via SUBCUTANEOUS
  Administered 2020-12-16: 5 [IU] via SUBCUTANEOUS
  Administered 2020-12-16: 3 [IU] via SUBCUTANEOUS
  Administered 2020-12-17: 2 [IU] via SUBCUTANEOUS
  Filled 2020-12-14 (×2): qty 1

## 2020-12-14 MED ORDER — SITAGLIP PHOS-METFORMIN HCL ER 100-1000 MG PO TB24: 1.0000 | ORAL_TABLET | Freq: Every day | ORAL | Status: DC

## 2020-12-14 MED ORDER — GLIPIZIDE ER 5 MG PO TB24
10.0000 mg | ORAL_TABLET | Freq: Every day | ORAL | Status: DC
  Administered 2020-12-14 – 2020-12-16 (×3): 10 mg via ORAL
  Filled 2020-12-14: qty 1
  Filled 2020-12-14 (×3): qty 2
  Filled 2020-12-14: qty 1

## 2020-12-14 MED ORDER — VANCOMYCIN HCL 2000 MG/400ML IV SOLN
2000.0000 mg | Freq: Once | INTRAVENOUS | Status: AC
  Administered 2020-12-14: 04:00:00 2000 mg via INTRAVENOUS
  Filled 2020-12-14: qty 400

## 2020-12-14 MED ORDER — ACETAMINOPHEN 650 MG RE SUPP: 650.0000 mg | Freq: Four times a day (QID) | RECTAL | Status: DC | PRN

## 2020-12-14 MED ORDER — METHOCARBAMOL 500 MG PO TABS
500.0000 mg | ORAL_TABLET | Freq: Every day | ORAL | Status: DC
Start: 2020-12-14 — End: 2020-12-17
  Administered 2020-12-14 – 2020-12-16 (×3): 500 mg via ORAL
  Filled 2020-12-14 (×3): qty 1

## 2020-12-14 MED ORDER — LINAGLIPTIN 5 MG PO TABS
5.0000 mg | ORAL_TABLET | Freq: Every day | ORAL | Status: DC
  Administered 2020-12-14 – 2020-12-16 (×3): 5 mg via ORAL
  Filled 2020-12-14 (×5): qty 1

## 2020-12-14 MED ORDER — VANCOMYCIN HCL 1750 MG/350ML IV SOLN
1750.0000 mg | INTRAVENOUS | Status: DC
  Administered 2020-12-15 – 2020-12-17 (×3): 1750 mg via INTRAVENOUS
  Filled 2020-12-14 (×6): qty 350

## 2020-12-14 MED ORDER — OXYCODONE HCL 5 MG PO TABS
5.0000 mg | ORAL_TABLET | ORAL | Status: DC | PRN
  Administered 2020-12-14 – 2020-12-17 (×4): 5 mg via ORAL
  Filled 2020-12-14 (×4): qty 1

## 2020-12-14 MED ORDER — PIPERACILLIN-TAZOBACTAM 3.375 G IVPB 30 MIN
3.3750 g | Freq: Once | INTRAVENOUS | Status: AC
  Administered 2020-12-14: 04:00:00 3.375 g via INTRAVENOUS
  Filled 2020-12-14: qty 50

## 2020-12-14 MED ORDER — SODIUM CHLORIDE 0.9 % IV SOLN
2.0000 g | Freq: Three times a day (TID) | INTRAVENOUS | Status: DC
  Administered 2020-12-14 – 2020-12-17 (×9): 2 g via INTRAVENOUS
  Filled 2020-12-14 (×10): qty 2

## 2020-12-14 MED ORDER — ONDANSETRON HCL 4 MG/2ML IJ SOLN: 4.0000 mg | Freq: Four times a day (QID) | INTRAMUSCULAR | Status: DC | PRN

## 2020-12-14 MED ORDER — LISINOPRIL 10 MG PO TABS
20.0000 mg | ORAL_TABLET | Freq: Every day | ORAL | Status: DC
  Administered 2020-12-14 – 2020-12-17 (×4): 20 mg via ORAL
  Filled 2020-12-14 (×4): qty 2

## 2020-12-14 MED ORDER — SIMVASTATIN 20 MG PO TABS
20.0000 mg | ORAL_TABLET | Freq: Every evening | ORAL | Status: DC
  Administered 2020-12-14 – 2020-12-16 (×3): 20 mg via ORAL
  Filled 2020-12-14 (×3): qty 1

## 2020-12-14 MED ORDER — ASPIRIN 81 MG PO CHEW
81.0000 mg | CHEWABLE_TABLET | Freq: Every day | ORAL | Status: DC
  Administered 2020-12-14 – 2020-12-17 (×4): 81 mg via ORAL
  Filled 2020-12-14 (×5): qty 1

## 2020-12-14 MED ORDER — ONDANSETRON HCL 4 MG PO TABS: 4.0000 mg | ORAL_TABLET | Freq: Four times a day (QID) | ORAL | Status: DC | PRN

## 2020-12-14 MED ORDER — SODIUM CHLORIDE 0.9 % IV SOLN
2.0000 g | Freq: Once | INTRAVENOUS | Status: AC
  Administered 2020-12-14: 08:00:00 2 g via INTRAVENOUS
  Filled 2020-12-14: qty 2

## 2020-12-14 MED ORDER — ENOXAPARIN SODIUM 40 MG/0.4ML ~~LOC~~ SOLN
40.0000 mg | SUBCUTANEOUS | Status: DC
  Administered 2020-12-14 – 2020-12-17 (×4): 40 mg via SUBCUTANEOUS
  Filled 2020-12-14 (×5): qty 0.4

## 2020-12-14 NOTE — Progress Notes (Signed)
Patient states he has a DNR form at home. Asked patient if there was someone that could bring in form. Patient stated he is unaware of where the DNR form is located. Dr. Jerral Ralph notified.

## 2020-12-14 NOTE — Progress Notes (Signed)
Pharmacy Antibiotic Note  Kurt [name] is a 69 y.o. male admitted on 12/14/2020 with cellulitis.  Pharmacy has been consulted for Vancomycin and Cefepime dosing.  Plan: Vancomycin 2000 mg IV x 1 dose. Vancomycin 1750 mg IV every 24 hours. Expected AUC 476 Cefepime 2000 mg IV every 8 hours. Monitor labs, c/s, and vanco level as indicated  Height: 5\' 5"  (165.1 cm) Weight: 95.7 kg (211 lb) IBW/kg (Calculated) : 61.5  Temp (24hrs), Avg:98.2 F (36.8 C), Min:98 F (36.7 C), Max:98.3 F (36.8 C)  Recent Labs  Lab 12/14/20 0108 12/14/20 0312  WBC 11.3*  --   CREATININE 0.88  --   LATICACIDVEN 1.6 1.5    Estimated Creatinine Clearance: 83.1 mL/min (by C-G formula based on SCr of 0.88 mg/dL).    Allergies  Allergen Reactions  . Augmentin [Amoxicillin-Pot Clavulanate] Nausea And Vomiting  . Iodine Hives  . Tape Rash    Paper     Antimicrobials this admission: Vanco 12/29 >>  Cefepime 12/29 >>    Microbiology results: 12/29 BCx: pending   Thank you for allowing pharmacy to be a part of this patient's care.  1/30 12/14/2020 1:48 PM

## 2020-12-14 NOTE — Consult Note (Addendum)
WOC Nurse Consult Note: Reason for Consult: Consult requested for left leg.  Performed remotely after review of photos and progress notes in the EMR. X-ray does not indicate osteomyelitis.  Wound type: Left leg with cellulitis; generalized edema and erythremia and blisters with mod amt yellow drainage and weeping from scattered locations. Expect blisters to begin to rupture and peel in sheets and evolve into extensive areas of partial thickness skin loss when this occurs.  Dressing procedure/placement/frequency: Topical treatment orders provided for bedside nurses to perform to promote drying and healing: Apply Xeroform gauze to left leg Q day, then cover with ABD pads and kerlex and an ace wrap, beginning just behind toes to below knee. Pt could benefit from home health after discharge for dressing changes and continued assessments; please order if desired. Please re-consult if further assistance is needed.  Thank-you,  Cammie Mcgee MSN, RN, CWOCN, [place], CNS [phone]

## 2020-12-14 NOTE — H&P (Signed)
History and Physical    Kurt [name] PPI:[id number] DOB: 1951/05/03 DOA: 12/14/2020  PCP: Kurt Oiler, FNP   Patient coming from: Home.  I have personally briefly reviewed patient's old medical records in [facility]  Chief Complaint: LLE pain.  HPI: Kurt [name] is a 68 y.o. male with medical history significant of osteoarthritis, bronchitis, cataracts, concussion, GERD, history of urolithiasis, hypertension, hypertensive retinopathy OU, osteogenesis imperfecta, post polio syndrome, type II DM who is coming to the emergency department with complaints of progressively worse left lower pretibial area and left medial ankle area tenderness associated with erythema, calor, seropurulent discharge and edema for the past 2 days.  He denies fever, chills, but feels fatigued.  No rhinorrhea or sore throat.  Denies dyspnea, wheezing or hemoptysis.  No chest pain, palpitations, dizziness, diaphoresis, PND or orthopnea.  Denies abdominal pain, nausea, vomiting, diarrhea, constipation, melena or hematochezia.  No dysuria, frequency or hematuria.  He states that his blood glucose has been fairly controlled.  Denies polyuria, polydipsia, polyphagia or blurred vision.  ED Course: Initial vital signs were temperature 98.3 F, pulse 94, respirations 16, blood pressure 136/81 mmHg O2 sat 98% on room air.  Labwork: His CBC showed a white count of 11.3 with 53% neutrophils, 35% lymphocytes, hemoglobin 15.0 g/dL and platelets 063.  CMP showed a glucose of 117 mg/dL, all other values were normal.  His lactic acid was normal.  SARS 2 and influenza PCR was negative.  Imaging: DVS/fibula x-ray of the left lower extremity shows soft tissue swelling with superimposed socio-Zaroxolyn.  There is healing fractures of the proximal tibia and mid and distal diaphysis of the fibula.  There is chronic changes related to the patient's underlying osteogenesis imperfecta.  Please see images and full regular report  for further detail.  Review of Systems: As per HPI otherwise all other systems reviewed and are negative.  Past Medical History:  Diagnosis Date  . Arthritis   . Bronchitis   . Bronchitis   . Cataract    OU  . Concussion    late 1990's  after a fall  . GERD (gastroesophageal reflux disease)   . History of kidney stones   . Hypertension   . Hypertensive retinopathy    OU  . Osteogenesis imperfecta   . PONV (postoperative nausea and vomiting)    pt has post polio syndrome  . Post-polio syndrome   . Type 2 diabetes mellitus (HCC) 12/25/2018   Past Surgical History:  Procedure Laterality Date  . ANKLE FRACTURE SURGERY Bilateral   . arm surgery Right    nerve surgery  . COLONOSCOPY    . ELBOW FRACTURE SURGERY Left   . EYE MUSCLE SURGERY Left   . LEG SURGERY Left    femur fracture with rod  . REVERSE SHOULDER ARTHROPLASTY Right 11/10/2019   Procedure: RIGHT REVERSE SHOULDER ARTHROPLASTY;  Surgeon: Cammy Copa, MD;  [facility] OR;  Service: Orthopedics;  Laterality: Right;  . TONSILLECTOMY     Social History  reports that he has never smoked. He has never used smokeless tobacco. He reports current alcohol use. He reports that he does not use drugs.  Allergies  Allergen Reactions  . Augmentin [Amoxicillin-Pot Clavulanate] Nausea And Vomiting  . Iodine Hives  . Tape Rash    Paper    Family History  Problem Relation Age of Onset  . Hypertension Mother   . Diabetes Brother    Prior to Admission medications   Medication Sig  Start Date End Date Taking? Authorizing Provider  acetaminophen (TYLENOL) 650 MG CR tablet Take 1,300 mg by mouth every 8 (eight) hours as needed for pain.    [provider]  aspirin 81 MG chewable tablet Chew 1 tablet (81 mg total) by mouth daily. 11/25/19   Magnant, Charles L, PA-C  glipiZIDE (GLUCOTROL XL) 10 MG 24 hr tablet Take 10 mg by mouth at bedtime.     [provider]  LANTUS SOLOSTAR 100 UNIT/ML Solostar Pen  Inject 33 Units into the skin at bedtime.  12/19/18   [provider]  lisinopril (PRINIVIL,ZESTRIL) 20 MG tablet Take 20 mg by mouth daily.    [provider]  methocarbamol (ROBAXIN) 500 MG tablet Take 500 mg by mouth at bedtime.  07/02/19   [provider]  Olopatadine HCl 0.7 % SOLN Apply to eye.    [provider]  [facility] ULTRA test strip 1 each 2 (two) times daily. 10/05/20   [provider]  oxyCODONE (OXY IR/ROXICODONE) 5 MG immediate release tablet Take 1 tablet (5 mg total) by mouth every 8 (eight) hours as needed for moderate pain (pain score 4-6). 11/25/19   Magnant, Charles L, PA-C  simvastatin (ZOCOR) 20 MG tablet Take 20 mg by mouth every evening.    [provider]  SitaGLIPtin-MetFORMIN HCl [id number] MG TB24 Take 1 tablet by mouth at bedtime.    [provider]   Physical Exam: Vitals:   12/13/20 1833 12/14/20 0137  BP: 136/81 (!) 145/81  Pulse: 94 80  Resp: 16 17  Temp: 98.3 F (36.8 C) 98 F (36.7 C)  TempSrc: Oral Oral  SpO2: 98% 97%  Weight: 95.7 kg   Height: 5\' 5"  (1.651 m)    Constitutional: NAD, calm, comfortable Eyes: PERRL, lids and conjunctivae normal ENMT: Mucous membranes are moist. Posterior pharynx clear of any exudate or lesions. Neck: normal, supple, no masses, no thyromegaly Respiratory: clear to auscultation bilaterally, no wheezing, no crackles. Normal respiratory effort. No accessory muscle use.  Cardiovascular: Regular rate and rhythm, no murmurs / rubs / gallops. No extremity edema. 2+ pedal pulses. No carotid bruits.  Abdomen: Obese, nondistended.  Bowel sounds positive.  Soft, no tenderness, no masses palpated. No hepatosplenomegaly. Musculoskeletal: no clubbing / cyanosis.  Good ROM, no contractures. Normal muscle tone.  Skin: Significant erythema, edema, calor, TTP, blisters and bullae on LLE.  RLE has pretibial erythema on lower portion.  Please see images below. Neurologic: CN 2-12  grossly intact. Sensation intact, DTR normal. Strength 5/5 in all 4.  Psychiatric: Normal judgment and insight. Alert and oriented x 3. Normal mood.              Labs on Admission: I have personally reviewed following labs and imaging studies  CBC: Recent Labs  Lab 12/14/20 0108  WBC 11.3*  NEUTROABS 6.1  HGB 15.0  HCT 49.8  MCV 94.7  PLT 321   Basic Metabolic Panel: Recent Labs  Lab 12/14/20 0108  NA 136  K 4.0  CL 104  CO2 22  GLUCOSE 117*  BUN 13  CREATININE 0.88  CALCIUM 9.3   GFR: Estimated Creatinine Clearance: 83.1 mL/min (by C-G formula based on SCr of 0.88 mg/dL).  Liver Function Tests: Recent Labs  Lab 12/14/20 0108  AST 18  ALT 24  ALKPHOS 65  BILITOT 0.9  PROT 7.4  ALBUMIN 4.3   Radiological Exams on Admission: DG Tibia/Fibula Left  Result Date: 12/14/2020 CLINICAL DATA:  Cellulitis. Post-polio  syndrome, osteogenesis imperfecta. EXAM: LEFT TIBIA AND FIBULA - 2 VIEW COMPARISON:  None. FINDINGS: Intramedullary rod is partially visualized within the visualized distal femur. The distal femur is relatively gracile in keeping with the patient's given history. The osseous structures are diffusely osteopenic. The tibia demonstrates anteromedial bowing and, similarly, demonstrates diffuse demineralization and a relatively gracile configuration in keeping with the patient's given history. There is a healing transverse fracture of the proximal metadiaphysis with fracture fragments in near anatomic alignment. Bridging callus identified. Distally, healed posterior and medial malleolar fractures are noted with normal overall alignment and residual surgical hardware. Healing fractures of the proximal and mid to distal third of the diaphysis of the fibula are identified with fracture fragments in anatomic alignment. Bridging callus noted at both fractures. There is synostosis of the distal third of the diaphysis of the tibia and fibula in the region of the a  healing fracture. Residual widening of the distal tibia fibular articulation. The visualized osseous structures of the hindfoot are diffusely osteopenic. Pes planus deformity noted. Small plantar calcaneal spur noted. There is diffuse subcutaneous edema involving the left lower extremity with more focal soft tissue swelling involving the visualized dorsum of the left midfoot and superficial to the medial malleolus. No superimposed osseous erosion identified. IMPRESSION: Soft tissue swelling.  No superimposed osseous erosion. Healing fractures of the proximal tibia and mid and distal diaphysis of the fibula. Chronic changes related to the patient's underlying osteogenesis imperfecta as outlined above. Electronically Signed   By: Helyn Numbers MD   On: 12/14/2020 01:31    EKG: Independently reviewed.  Assessment/Plan Principal Problem:   Cellulitis of left lower extremity Observation/MedSurg. Consult wound care. May need debridement. Consult general surgery. Analgesics as needed. Cefepime per pharmacy. Vancomycin per pharmacy. Follow blood culture and sensitivity.  Active Problems:   Post-polio syndrome Supportive care. Consider PT evaluation.    Osteogenesis imperfecta Supportive care. Analgesics as needed.    Type 2 diabetes mellitus (HCC) Carbohydrate modified diet. Continue glipizide 10 mg p.o. daily. Continue Janumet 1 tablet p.o. at bedtime.    Hypertension Continue lisinopril 20 mg p.o. daily. Monitor BP, renal function electrolytes.    DVT prophylaxis: Lovenox SQ. Code Status:   Full code. Family Communication: Disposition Plan:   Patient is from:  Home.  Anticipated DC to:  Home.  Anticipated DC date:  12/16/2020.  Anticipated DC barriers: Clinical condition.  Consults called: Admission status:  Observation/MedSurg.  Severity of Illness:  Bobette Mo MD [facility]  How to contact the [facility] Attending or Consulting provider 7A - 7P or covering  provider during after hours 7P -7A, for this patient?   1. Check the care team in [facility] and look for a) attending/consulting [facility] provider listed and b) the [facility] team listed 2. Log into [email or web address] and use [place]'s universal password to access. If you do not have the password, please contact the hospital operator. 3. Locate the [facility] provider you are looking for under [facility] and page to a number that you can be directly reached. 4. If you still have difficulty reaching the provider, please page the [place] (Director on Call) for the Hospitalists listed on amion for assistance.  12/14/2020, 3:06 AM   This document was prepared using Dragon voice recognition software and may contain some unintended transcription errors

## 2020-12-14 NOTE — ED Notes (Signed)
Non-stick dressing applied to LLE.

## 2020-12-14 NOTE — ED Provider Notes (Signed)
[facility] EMERGENCY DEPARTMENT Provider Note   CSN: [id number] Arrival date & time: 12/13/20  1737     History Chief Complaint  Patient presents with  . Leg Pain    Kurt [name] is a 69 y.o. male.  Patient presents to the emergency department with concerns over recurrent cellulitis of the left leg.  Patient reports that he has had recurrent infection in the leg in the past.  Over the last couple of days the leg has become very swollen, red and warm to the touch.  It is now weeping and he has noticed blisters.        Past Medical History:  Diagnosis Date  . Arthritis   . Bronchitis   . Bronchitis   . Cataract    OU  . Concussion    late 1990's  after a fall  . GERD (gastroesophageal reflux disease)   . History of kidney stones   . Hypertension   . Hypertensive retinopathy    OU  . Osteogenesis imperfecta   . PONV (postoperative nausea and vomiting)    pt has post polio syndrome  . Post-polio syndrome   . Type 2 diabetes mellitus (HCC) 12/25/2018    Patient Active Problem List   Diagnosis Date Noted  . Cellulitis of left lower extremity 12/14/2020  . Hypertension   . 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 Cellulitis 02/24/2019  . 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 Osteogenesis imperfecta 12/25/2018  . Type 2 diabetes mellitus (HCC) 12/25/2018    Past Surgical History:  Procedure Laterality Date  . ANKLE FRACTURE SURGERY Bilateral   . arm surgery Right    nerve surgery  . COLONOSCOPY    . ELBOW FRACTURE SURGERY Left   . EYE MUSCLE SURGERY Left   . LEG SURGERY Left    femur fracture with rod  . REVERSE SHOULDER ARTHROPLASTY Right 11/10/2019   Procedure: RIGHT REVERSE SHOULDER ARTHROPLASTY;  Surgeon: Cammy Copa, MD;  Location: [facility] OR;  Service: Orthopedics;  Laterality: Right;  . TONSILLECTOMY         Family History  Problem Relation Age of Onset  . Hypertension Mother   . Diabetes Brother     Social History   Tobacco Use  . Smoking  status: Never Smoker  . Smokeless tobacco: Never Used  Vaping Use  . Vaping Use: Never used  Substance Use Topics  . Alcohol use: Yes    Comment: 1 beer occasionally  . Drug use: No    Home Medications Prior to Admission medications   Medication Sig Start Date End Date Taking? Authorizing Provider  acetaminophen (TYLENOL) 650 MG CR tablet Take 1,300 mg by mouth every 8 (eight) hours as needed for pain.    [provider]  aspirin 81 MG chewable tablet Chew 1 tablet (81 mg total) by mouth daily. 11/25/19   Magnant, Charles L, PA-C  glipiZIDE (GLUCOTROL XL) 10 MG 24 hr tablet Take 10 mg by mouth at bedtime.     [provider]  LANTUS SOLOSTAR 100 UNIT/ML Solostar Pen Inject 33 Units into the skin at bedtime.  12/19/18   [provider]  lisinopril (PRINIVIL,ZESTRIL) 20 MG tablet Take 20 mg by mouth daily.    [provider]  methocarbamol (ROBAXIN) 500 MG tablet Take 500 mg by mouth at bedtime.  07/02/19   [provider]  Olopatadine HCl 0.7 % SOLN Apply to eye.    [provider]  [facility] ULTRA test strip 1 each  2 (two) times daily. 10/05/20   [provider]  oxyCODONE (OXY IR/ROXICODONE) 5 MG immediate release tablet Take 1 tablet (5 mg total) by mouth every 8 (eight) hours as needed for moderate pain (pain score 4-6). 11/25/19   Magnant, Charles L, PA-C  simvastatin (ZOCOR) 20 MG tablet Take 20 mg by mouth every evening.    [provider]  SitaGLIPtin-MetFORMIN HCl [phone] MG TB24 Take 1 tablet by mouth at bedtime.    [provider]    Allergies    Augmentin [amoxicillin-pot clavulanate], Iodine, and Tape  Review of Systems   Review of Systems  Skin: Positive for color change and wound.  All other systems reviewed and are negative.   Physical Exam Updated Vital Signs BP (!) 150/80 (BP Location: Right Arm)   Pulse 84   Temp 98 F (36.7 C) (Oral)   Resp 18   Ht 5\' 5"  (1.651 m)   Wt 95.7 kg    SpO2 99%   BMI 35.11 kg/m   Physical Exam Vitals and nursing note reviewed.  Constitutional:      General: He is not in acute distress.    Appearance: Normal appearance. He is well-developed and well-nourished.  HENT:     Head: Normocephalic and atraumatic.     Right Ear: Hearing normal.     Left Ear: Hearing normal.     Nose: Nose normal.     Mouth/Throat:     Mouth: Oropharynx is clear and moist and mucous membranes are normal.  Eyes:     Extraocular Movements: EOM normal.     Conjunctiva/sclera: Conjunctivae normal.     Pupils: Pupils are equal, round, and reactive to light.  Cardiovascular:     Rate and Rhythm: Regular rhythm.     Heart sounds: S1 normal and S2 normal. No murmur heard. No friction rub. No gallop.   Pulmonary:     Effort: Pulmonary effort is normal. No respiratory distress.     Breath sounds: Normal breath sounds.  Chest:     Chest wall: No tenderness.  Abdominal:     General: Bowel sounds are normal.     Palpations: Abdomen is soft. There is no hepatosplenomegaly.     Tenderness: There is no abdominal tenderness. There is no guarding or rebound. Negative signs include Murphy's sign and McBurney's sign.     Hernia: No hernia is present.  Musculoskeletal:        General: Normal range of motion.     Cervical back: Normal range of motion and neck supple.     Left lower leg: Edema present.  Skin:    General: Skin is warm, dry and intact.     Findings: No rash.     Nails: There is no cyanosis.     Comments: Erythema, warmth, blistering of left lower leg and ankle  Neurological:     Mental Status: He is alert and oriented to person, place, and time.     GCS: GCS eye subscore is 4. GCS verbal subscore is 5. GCS motor subscore is 6.     Cranial Nerves: No cranial nerve deficit.     Sensory: No sensory deficit.     Coordination: Coordination normal.     Deep Tendon Reflexes: Strength normal.  Psychiatric:        Mood and Affect: Mood and affect normal.         Speech: Speech normal.        Behavior: Behavior normal.  Thought Content: Thought content normal.     ED Results / Procedures / Treatments   Labs (all labs ordered are listed, but only abnormal results are displayed) Labs Reviewed  COMPREHENSIVE METABOLIC PANEL - Abnormal; Notable for the following components:      Result Value   Glucose, Bld 117 (*)    All other components within normal limits  CBC WITH DIFFERENTIAL/PLATELET - Abnormal; Notable for the following components:   WBC 11.3 (*)    All other components within normal limits  RESP PANEL BY RT-PCR (FLU A&B, COVID) ARPGX2  CULTURE, BLOOD (ROUTINE X 2)  CULTURE, BLOOD (ROUTINE X 2)  LACTIC ACID, PLASMA  LACTIC ACID, PLASMA  URINALYSIS, ROUTINE W REFLEX MICROSCOPIC    EKG None  Radiology DG Tibia/Fibula Left  Result Date: 12/14/2020 CLINICAL DATA:  Cellulitis. Post-polio syndrome, osteogenesis imperfecta. EXAM: LEFT TIBIA AND FIBULA - 2 VIEW COMPARISON:  None. FINDINGS: Intramedullary rod is partially visualized within the visualized distal femur. The distal femur is relatively gracile in keeping with the patient's given history. The osseous structures are diffusely osteopenic. The tibia demonstrates anteromedial bowing and, similarly, demonstrates diffuse demineralization and a relatively gracile configuration in keeping with the patient's given history. There is a healing transverse fracture of the proximal metadiaphysis with fracture fragments in near anatomic alignment. Bridging callus identified. Distally, healed posterior and medial malleolar fractures are noted with normal overall alignment and residual surgical hardware. Healing fractures of the proximal and mid to distal third of the diaphysis of the fibula are identified with fracture fragments in anatomic alignment. Bridging callus noted at both fractures. There is synostosis of the distal third of the diaphysis of the tibia and fibula in the region of the  a healing fracture. Residual widening of the distal tibia fibular articulation. The visualized osseous structures of the hindfoot are diffusely osteopenic. Pes planus deformity noted. Small plantar calcaneal spur noted. There is diffuse subcutaneous edema involving the left lower extremity with more focal soft tissue swelling involving the visualized dorsum of the left midfoot and superficial to the medial malleolus. No superimposed osseous erosion identified. IMPRESSION: Soft tissue swelling.  No superimposed osseous erosion. Healing fractures of the proximal tibia and mid and distal diaphysis of the fibula. Chronic changes related to the patient's underlying osteogenesis imperfecta as outlined above. Electronically Signed   By: Helyn Numbers MD   On: 12/14/2020 01:31    Procedures Procedures (including critical care time)  Medications Ordered in ED Medications  enoxaparin (LOVENOX) injection 40 mg (40 mg Subcutaneous Given 12/14/20 0627)  acetaminophen (TYLENOL) tablet 650 mg (has no administration in time range)    Or  acetaminophen (TYLENOL) suppository 650 mg (has no administration in time range)  oxyCODONE (Oxy IR/ROXICODONE) immediate release tablet 5 mg (has no administration in time range)  ondansetron (ZOFRAN) tablet 4 mg (has no administration in time range)    Or  ondansetron (ZOFRAN) injection 4 mg (has no administration in time range)  SitaGLIPtin-MetFORMIN HCl [id number] MG TB24 1 tablet (has no administration in time range)  simvastatin (ZOCOR) tablet 20 mg (has no administration in time range)  methocarbamol (ROBAXIN) tablet 500 mg (has no administration in time range)  lisinopril (ZESTRIL) tablet 20 mg (has no administration in time range)  glipiZIDE (GLUCOTROL XL) 24 hr tablet 10 mg (has no administration in time range)  aspirin chewable tablet 81 mg (has no administration in time range)  insulin aspart (novoLOG) injection 0-15 Units (has no administration in time range)  ceFEPIme (MAXIPIME) 2 g in sodium chloride 0.9 % 100 mL IVPB (has no administration in time range)  piperacillin-tazobactam (ZOSYN) IVPB 3.375 g (0 g Intravenous Stopped 12/14/20 0418)  vancomycin (VANCOREADY) IVPB 2000 mg/400 mL (0 mg Intravenous Stopped 12/14/20 [id number])    ED Course  I have reviewed the triage vital signs and the nursing notes.  Pertinent labs & imaging results that were available during my care of the patient were reviewed by me and considered in my medical decision making (see chart for details).    MDM Rules/Calculators/A&P                          Patient presents to the emergency department with concern over possible infection of his left leg.  Patient has a history of osteogenesis imperfecta and had a significant fracture of the left lower leg in the past.  He has developed recurrent cellulitic infections after the injury.  Examination revealed significant swelling with erythema and warmth.  There is blistering and weeping.  X-ray does not show any gas formation.  He is a diabetic.  Symptoms developed rapidly over the last 1 to 2 days.  He will therefore require hospitalization for IV antibiotic therapy.  He does not appear septic.  Treated with Zosyn and vancomycin initially.  Final Clinical Impression(s) / ED Diagnoses Final diagnoses:  Cellulitis of left lower extremity    Rx / DC Orders ED Discharge Orders    None       Joseantonio Dittmar, Canary Brimhristopher J, MD 12/14/20 [phone]

## 2020-12-14 NOTE — Progress Notes (Signed)
Patient seen and examined.  Admitted early morning hours by nighttime hospitalist. Brief history 69 year old gentleman with history of osteoarthritis, bronchitis, GERD, hypertension, osteogenesis imperfecta, post polio syndrome and type 2 diabetes presented to the ER with progressive worse left lower pretibial area swelling and tenderness.  In the emergency room hemodynamically stable.  Was found with significant soft tissue infection.  Bilateral lower extremity cellulitis, spreading cellulitis of the left leg more than the right leg with underlying chronic lymphedema: Significant erythema and swelling left leg.  Agree with continuing antibiotics with vancomycin and cefepime. He will probably respond to conservative management.  I discussed case with surgery. X-ray does not show any evidence of osteomyelitis, he has multiple hardware's.  If no appropriate response to IV antibiotics, will check CT scan with contrast to look for any collection or osteomyelitis.  Patient has significant symptoms, worsening and spreading cellulitis on the left leg.  He needs to stay in the hospital and monitor on IV antibiotics.

## 2020-12-15 DIAGNOSIS — Z8249 Family history of ischemic heart disease and other diseases of the circulatory system: Secondary | ICD-10-CM | POA: Diagnosis not present

## 2020-12-15 DIAGNOSIS — G8191 Hemiplegia, unspecified affecting right dominant side: Secondary | ICD-10-CM | POA: Diagnosis present

## 2020-12-15 DIAGNOSIS — Z79899 Other long term (current) drug therapy: Secondary | ICD-10-CM | POA: Diagnosis not present

## 2020-12-15 DIAGNOSIS — L03115 Cellulitis of right lower limb: Secondary | ICD-10-CM | POA: Diagnosis present

## 2020-12-15 DIAGNOSIS — Z20822 Contact with and (suspected) exposure to covid-19: Secondary | ICD-10-CM | POA: Diagnosis present

## 2020-12-15 DIAGNOSIS — Z87442 Personal history of urinary calculi: Secondary | ICD-10-CM | POA: Diagnosis not present

## 2020-12-15 DIAGNOSIS — K219 Gastro-esophageal reflux disease without esophagitis: Secondary | ICD-10-CM | POA: Diagnosis present

## 2020-12-15 DIAGNOSIS — I890 Lymphedema, not elsewhere classified: Secondary | ICD-10-CM | POA: Diagnosis present

## 2020-12-15 DIAGNOSIS — L03119 Cellulitis of unspecified part of limb: Secondary | ICD-10-CM | POA: Diagnosis present

## 2020-12-15 DIAGNOSIS — Q780 Osteogenesis imperfecta: Secondary | ICD-10-CM | POA: Diagnosis not present

## 2020-12-15 DIAGNOSIS — Z880 Allergy status to penicillin: Secondary | ICD-10-CM | POA: Diagnosis not present

## 2020-12-15 DIAGNOSIS — Z96611 Presence of right artificial shoulder joint: Secondary | ICD-10-CM | POA: Diagnosis present

## 2020-12-15 DIAGNOSIS — E119 Type 2 diabetes mellitus without complications: Secondary | ICD-10-CM | POA: Diagnosis present

## 2020-12-15 DIAGNOSIS — G14 Postpolio syndrome: Secondary | ICD-10-CM | POA: Diagnosis present

## 2020-12-15 DIAGNOSIS — L02414 Cutaneous abscess of left upper limb: Secondary | ICD-10-CM | POA: Diagnosis present

## 2020-12-15 DIAGNOSIS — Z833 Family history of diabetes mellitus: Secondary | ICD-10-CM | POA: Diagnosis not present

## 2020-12-15 DIAGNOSIS — L03116 Cellulitis of left lower limb: Secondary | ICD-10-CM | POA: Diagnosis present

## 2020-12-15 DIAGNOSIS — Z7982 Long term (current) use of aspirin: Secondary | ICD-10-CM | POA: Diagnosis not present

## 2020-12-15 DIAGNOSIS — Z794 Long term (current) use of insulin: Secondary | ICD-10-CM | POA: Diagnosis not present

## 2020-12-15 DIAGNOSIS — I10 Essential (primary) hypertension: Secondary | ICD-10-CM | POA: Diagnosis present

## 2020-12-15 DIAGNOSIS — Z7984 Long term (current) use of oral hypoglycemic drugs: Secondary | ICD-10-CM | POA: Diagnosis not present

## 2020-12-15 LAB — CBC WITH DIFFERENTIAL/PLATELET
Abs Immature Granulocytes: 0.05 10*3/uL (ref 0.00–0.07)
Basophils Absolute: 0 10*3/uL (ref 0.0–0.1)
Basophils Relative: 0 %
Eosinophils Absolute: 0.4 10*3/uL (ref 0.0–0.5)
Eosinophils Relative: 4 %
HCT: 43.4 % (ref 39.0–52.0)
Hemoglobin: 13.6 g/dL (ref 13.0–17.0)
Immature Granulocytes: 1 %
Lymphocytes Relative: 24 %
Lymphs Abs: 2.6 10*3/uL (ref 0.7–4.0)
MCH: 28.7 pg (ref 26.0–34.0)
MCHC: 31.3 g/dL (ref 30.0–36.0)
MCV: 91.6 fL (ref 80.0–100.0)
Monocytes Absolute: 0.9 10*3/uL (ref 0.1–1.0)
Monocytes Relative: 9 %
Neutro Abs: 6.8 10*3/uL (ref 1.7–7.7)
Neutrophils Relative %: 62 %
Platelets: 289 10*3/uL (ref 150–400)
RBC: 4.74 MIL/uL (ref 4.22–5.81)
RDW: 15.3 % (ref 11.5–15.5)
WBC: 10.8 10*3/uL — ABNORMAL HIGH (ref 4.0–10.5)
nRBC: 0 % (ref 0.0–0.2)

## 2020-12-15 LAB — COMPREHENSIVE METABOLIC PANEL
ALT: 19 U/L (ref 0–44)
AST: 14 U/L — ABNORMAL LOW (ref 15–41)
Albumin: 3.5 g/dL (ref 3.5–5.0)
Alkaline Phosphatase: 58 U/L (ref 38–126)
Anion gap: 9 (ref 5–15)
BUN: 15 mg/dL (ref 8–23)
CO2: 21 mmol/L — ABNORMAL LOW (ref 22–32)
Calcium: 8.7 mg/dL — ABNORMAL LOW (ref 8.9–10.3)
Chloride: 104 mmol/L (ref 98–111)
Creatinine, Ser: 0.91 mg/dL (ref 0.61–1.24)
GFR, Estimated: >60 mL/min (ref >60)
Glucose, Bld: 127 mg/dL — ABNORMAL HIGH (ref 70–99)
Potassium: 4.2 mmol/L (ref 3.5–5.1)
Sodium: 134 mmol/L — ABNORMAL LOW (ref 135–145)
Total Bilirubin: 1 mg/dL (ref 0.3–1.2)
Total Protein: 6.4 g/dL — ABNORMAL LOW (ref 6.5–8.1)

## 2020-12-15 LAB — URINALYSIS, ROUTINE W REFLEX MICROSCOPIC
Bacteria, UA: NONE SEEN
Bilirubin Urine: NEGATIVE
Glucose, UA: >=500 mg/dL — AB
Hgb urine dipstick: NEGATIVE
Ketones, ur: NEGATIVE mg/dL
Leukocytes,Ua: NEGATIVE
Nitrite: NEGATIVE
Protein, ur: NEGATIVE mg/dL
Specific Gravity, Urine: 1.01 (ref 1.005–1.030)
pH: 5 (ref 5.0–8.0)

## 2020-12-15 LAB — GLUCOSE, CAPILLARY
Glucose-Capillary: 169 mg/dL — ABNORMAL HIGH (ref 70–99)
Glucose-Capillary: 237 mg/dL — ABNORMAL HIGH (ref 70–99)
Glucose-Capillary: 148 mg/dL — ABNORMAL HIGH (ref 70–99)
Glucose-Capillary: 266 mg/dL — ABNORMAL HIGH (ref 70–99)

## 2020-12-15 LAB — HIV ANTIBODY (ROUTINE TESTING W REFLEX): HIV Screen 4th Generation wRfx: NONREACTIVE

## 2020-12-15 NOTE — Evaluation (Signed)
Physical Therapy Evaluation Patient Details Name: Kurt [name] MRN: [id number] DOB: 1951/07/13 Today's Date: 12/15/2020   History of Present Illness  Kurt [name] is a 69 y.o. male with medical history significant of osteoarthritis, bronchitis, cataracts, concussion, GERD, history of urolithiasis, hypertension, hypertensive retinopathy OU, osteogenesis imperfecta, post polio syndrome, type II DM who is coming to the emergency department with complaints of progressively worse left lower pretibial area and left medial ankle area tenderness associated with erythema, calor, seropurulent discharge and edema for the past 2 days.  He denies fever, chills, but feels fatigued.  No rhinorrhea or sore throat.  Denies dyspnea, wheezing or hemoptysis.  No chest pain, palpitations, dizziness, diaphoresis, PND or orthopnea.  Denies abdominal pain, nausea, vomiting, diarrhea, constipation, melena or hematochezia.  No dysuria, frequency or hematuria.  He states that his blood glucose has been fairly controlled.  Denies polyuria, polydipsia, polyphagia or blurred vision.    Clinical Impression  Patient functioning near baseline for functional mobility and gait, demonstrates good return for transferring to commode in bathroom, standing over sink to wash hands and ambulating in room/hallway without loss of balance.  Patient encouraged to ambulate ad lib in room and with nursing staff as tolerated.  Plan:  Patient discharged from physical therapy to care of nursing for ambulation daily as tolerated for length of stay.     Follow Up Recommendations No PT follow up    Equipment Recommendations  None recommended by PT    Recommendations for Other Services       Precautions / Restrictions Precautions Precautions: None Restrictions Weight Bearing Restrictions: No      Mobility  Bed Mobility Overal bed mobility: Modified Independent             General bed mobility comments: slightly increased  time, labored movement    Transfers Overall transfer level: Modified independent               General transfer comment: slightly increased time, labored movement  Ambulation/Gait Ambulation/Gait assistance: Modified independent (Device/Increase time);Supervision Gait Distance (Feet): 80 Feet Assistive device: Rolling walker (2 wheeled) Gait Pattern/deviations: Decreased step length - left;Decreased stance time - right;Decreased stride length Gait velocity: decreased   General Gait Details: slightly labored cadence without loss of balance, demonstrates good return for ambulation in room/hallway  Stairs            Wheelchair Mobility    Modified Rankin (Stroke Patients Only)       Balance Overall balance assessment: Needs assistance Sitting-balance support: Feet supported;No upper extremity supported Sitting balance-Leahy Scale: Good     Standing balance support: During functional activity;Bilateral upper extremity supported Standing balance-Leahy Scale: Fair Standing balance comment: fair/good using Rollator                             Pertinent Vitals/Pain Pain Assessment: 0-10 Pain Score: 4  Pain Location: left lower leg Pain Descriptors / Indicators: Sore Pain Intervention(s): Limited activity within patient's tolerance;Monitored during session;Repositioned    Home Living Family/patient expects to be discharged to:: Private residence Living Arrangements: Alone Available Help at Discharge: Family;Neighbor;Available PRN/intermittently Type of Home: Mobile home Home Access: Ramped entrance     Home Layout: One level Home Equipment: Toilet riser;Grab bars - tub/shower;Tub bench;Walker - 4 wheels      Prior Function Level of Independence: Independent with assistive device(s)         Comments: household and short distanced community  ambulator using Rollator, drives     Hand Dominance   Dominant Hand: Left    Extremity/Trunk  Assessment   Upper Extremity Assessment Upper Extremity Assessment: Overall WFL for tasks assessed    Lower Extremity Assessment Lower Extremity Assessment: Overall WFL for tasks assessed    Cervical / Trunk Assessment Cervical / Trunk Assessment: Kyphotic  Communication   Communication: No difficulties  Cognition Arousal/Alertness: Awake/alert Behavior During Therapy: WFL for tasks assessed/performed Overall Cognitive Status: Within Functional Limits for tasks assessed                                        General Comments      Exercises     Assessment/Plan    PT Assessment Patent does not need any further PT services  PT Problem List         PT Treatment Interventions      PT Goals (Current goals can be found in the Care Plan section)  Acute Rehab PT Goals Patient Stated Goal: return home with family/neighbors to assist PRN PT Goal Formulation: With patient Time For Goal Achievement: 12/15/20 Potential to Achieve Goals: Good    Frequency     Barriers to discharge        Co-evaluation               AM-PAC PT "6 Clicks" Mobility  Outcome Measure Help needed turning from your back to your side while in a flat bed without using bedrails?: None Help needed moving from lying on your back to sitting on the side of a flat bed without using bedrails?: None Help needed moving to and from a bed to a chair (including a wheelchair)?: None Help needed standing up from a chair using your arms (e.g., wheelchair or bedside chair)?: None Help needed to walk in hospital room?: None Help needed climbing 3-5 steps with a railing? : A Little 6 Click Score: 23    End of Session   Activity Tolerance: Patient tolerated treatment well;Patient limited by fatigue Patient left: in chair;with call bell/phone within reach Nurse Communication: Mobility status PT Visit Diagnosis: Unsteadiness on feet (R26.81);Other abnormalities of gait and mobility  (R26.89);Muscle weakness (generalized) (M62.81)    Time: 0950-1010 PT Time Calculation (min) (ACUTE ONLY): 20 min   Charges:   PT Evaluation $PT Eval Moderate Complexity: 1 Mod          11:21 AM, 12/15/20 Ocie Bob, MPT Physical Therapist with [facility] 336 [phone] office [phone] mobile phone

## 2020-12-15 NOTE — Care Management Obs Status (Signed)
MEDICARE OBSERVATION STATUS NOTIFICATION   Patient Details  Name: Kurt [name] MRN: [id number] Date of Birth: 04/08/1951   Medicare Observation Status Notification Given:  Yes    Corey Harold 12/15/2020, 10:01 AM

## 2020-12-15 NOTE — Progress Notes (Signed)
OT Cancellation Note  Patient Details Name: Kurt [name] MRN: [id number] DOB: 1951/08/19   Cancelled Treatment:    Reason Eval/Treat Not Completed: OT screened, no needs identified, will sign off. Pt performing ADLs and mobility at mod independent level. No further OT services required at this time.   Ezra Sites, OTR/L  [phone] 12/15/2020, 12:40 PM

## 2020-12-15 NOTE — Progress Notes (Signed)
PROGRESS NOTE    Kurt [name]  ACZ:[id number] DOB: 07-06-1951 DOA: 12/14/2020 PCP: Altamease Oiler, FNP    Brief Narrative:  69 year old gentleman with history of osteoarthritis, bronchitis, GERD, hypertension, osteogenesis imperfecta, post polio syndrome with right hemiparesis and type 2 diabetes presented to the ER with progressive worse left lower pretibial area swelling and tenderness.  Also redness and swelling on the right leg.  In the emergency room, hemodynamically stable.  Left leg with significant spreading cellulitis.   Assessment & Plan:   Principal Problem:   Cellulitis of left lower extremity Active Problems:   Post-polio syndrome   Osteogenesis imperfecta   Type 2 diabetes mellitus (HCC)   Hypertension   Cellulitis and abscess of leg  Bilateral lower extremity cellulitis, spreading cellulitis of the left leg more than the right leg with underlying chronic lymphedema: -Some clinical response today with broad-spectrum antibiotics.  Continue vancomycin and cefepime today, pharmacy helping with dosing.  Mobilize.  No evidence of underlying abscess.  No evidence of hardware infection on x-ray. -Compression, elevation of the leg.  He will need ongoing compression for chronic management of lymphedema on the left leg.  Seen by wound care.  Local wound care on the posterior aspect of the leg.  Type 2 diabetes: On insulin and glipizide at home.  Continued.  Hypertension: Blood pressures fairly stable on current regimen.  Post polio syndrome, right hemiparesis: Uses walker.  Work with PT OT today.   DVT prophylaxis: enoxaparin (LOVENOX) injection 40 mg Start: 12/14/20 0600   Code Status: Full code Family Communication: None Disposition Plan: Status is: Inpatient  Remains inpatient appropriate because:Inpatient level of care appropriate due to severity of illness   Dispo: The patient is from: Home              Anticipated d/c is to: Home               Anticipated d/c date is: 2 days              Patient currently is not medically stable to d/c.         Consultants:   None  Procedures:   None  Antimicrobials:  Antibiotics Given (last 72 hours)    Date/Time Action Medication Dose Rate   12/14/20 0351 New Bag/Given   piperacillin-tazobactam (ZOSYN) IVPB 3.375 g 3.375 g 100 mL/hr   12/14/20 0419 New Bag/Given   vancomycin (VANCOREADY) IVPB 2000 mg/400 mL 2,000 mg 200 mL/hr   12/14/20 0739 New Bag/Given   ceFEPIme (MAXIPIME) 2 g in sodium chloride 0.9 % 100 mL IVPB 2 g 200 mL/hr   12/14/20 1719 New Bag/Given   ceFEPIme (MAXIPIME) 2 g in sodium chloride 0.9 % 100 mL IVPB 2 g 200 mL/hr   12/15/20 0016 New Bag/Given   ceFEPIme (MAXIPIME) 2 g in sodium chloride 0.9 % 100 mL IVPB 2 g 200 mL/hr   12/15/20 0542 New Bag/Given   vancomycin (VANCOREADY) IVPB 1750 mg/350 mL 1,750 mg 175 mL/hr   12/15/20 0845 New Bag/Given   ceFEPIme (MAXIPIME) 2 g in sodium chloride 0.9 % 100 mL IVPB 2 g 200 mL/hr         Subjective: Patient seen and examined.  No overnight events.  Still has significant pain and swelling on the left leg, somehow better than before.  Able to ambulate.  Objective: Vitals:   12/14/20 1800 12/14/20 2200 12/15/20 0545 12/15/20 0843  BP: (!) 115/53 95/65 (!) 103/59 119/75  Pulse: 78 73  77 77  Resp: 20 20 18    Temp: 98.4 F (36.9 C) 98.2 F (36.8 C) 98.3 F (36.8 C)   TempSrc: Oral Oral    SpO2: 98% 97% 98%   Weight:      Height:        Intake/Output Summary (Last 24 hours) at 12/15/2020 1359 Last data filed at 12/15/2020 0915 Gross per 24 hour  Intake 1130.52 ml  Output 250 ml  Net 880.52 ml   Filed Weights   12/13/20 1833  Weight: 95.7 kg    Examination:  General exam: Appears calm and comfortable  On room air.  Looks comfortable. Respiratory system: Clear to auscultation. Respiratory effort normal. Cardiovascular system: S1 & S2 heard, RRR.  Gastrointestinal system: Soft nontender. Central  nervous system: Alert and oriented. No focal neurological deficits. Extremities: Atrophied right arm and right leg. Redness with minimal swelling on the right leg. Redness, erythema, 2+ edema with no underlying fluctuation noted on right ankle up to below the knee.   Data Reviewed: I have personally reviewed following labs and imaging studies  CBC: Recent Labs  Lab 12/14/20 0108 12/15/20 0437  WBC 11.3* 10.8*  NEUTROABS 6.1 6.8  HGB 15.0 13.6  HCT 49.8 43.4  MCV 94.7 91.6  PLT 321 289   Basic Metabolic Panel: Recent Labs  Lab 12/14/20 0108 12/15/20 0437  NA 136 134*  K 4.0 4.2  CL 104 104  CO2 22 21*  GLUCOSE 117* 127*  BUN 13 15  CREATININE 0.88 0.91  CALCIUM 9.3 8.7*   GFR: Estimated Creatinine Clearance: 80.3 mL/min (by C-G formula based on SCr of 0.91 mg/dL). Liver Function Tests: Recent Labs  Lab 12/14/20 0108 12/15/20 0437  AST 18 14*  ALT 24 19  ALKPHOS 65 58  BILITOT 0.9 1.0  PROT 7.4 6.4*  ALBUMIN 4.3 3.5   No results for input(s): LIPASE, AMYLASE in the last 168 hours. No results for input(s): AMMONIA in the last 168 hours. Coagulation Profile: No results for input(s): INR, PROTIME in the last 168 hours. Cardiac Enzymes: No results for input(s): CKTOTAL, CKMB, CKMBINDEX, TROPONINI in the last 168 hours. BNP (last 3 results) No results for input(s): PROBNP in the last 8760 hours. HbA1C: No results for input(s): HGBA1C in the last 72 hours. CBG: Recent Labs  Lab 12/14/20 1223 12/14/20 1710 12/14/20 2106 12/15/20 0738 12/15/20 1133  GLUCAP 270* 184* 172* 169* 237*   Lipid Profile: No results for input(s): CHOL, HDL, LDLCALC, TRIG, CHOLHDL, LDLDIRECT in the last 72 hours. Thyroid Function Tests: No results for input(s): TSH, T4TOTAL, FREET4, T3FREE, THYROIDAB in the last 72 hours. Anemia Panel: No results for input(s): VITAMINB12, FOLATE, FERRITIN, TIBC, IRON, RETICCTPCT in the last 72 hours. Sepsis Labs: Recent Labs  Lab  12/14/20 0108 12/14/20 0312  LATICACIDVEN 1.6 1.5    Recent Results (from the past 240 hour(s))  Culture, blood (Routine X 2) w Reflex to ID Panel     Status: None (Preliminary result)   Collection Time: 12/14/20  1:08 AM   Specimen: BLOOD  Result Value Ref Range Status   Specimen Description BLOOD LEFT ANTECUBITAL  Final   Special Requests   Final    BOTTLES DRAWN AEROBIC AND ANAEROBIC Blood Culture adequate volume   Culture   Final    NO GROWTH 1 DAY Performed at [facility], [address] [place], [place]    Report Status PENDING  Incomplete  Resp Panel by RT-PCR (Flu A&B, Covid) Nasopharyngeal Swab  Status: None   Collection Time: 12/14/20  1:32 AM   Specimen: Nasopharyngeal Swab; Nasopharyngeal(NP) swabs in vial transport medium  Result Value Ref Range Status   SARS Coronavirus 2 by RT PCR NEGATIVE NEGATIVE Final    Comment: (NOTE) SARS-CoV-2 target nucleic acids are NOT DETECTED.  The SARS-CoV-2 RNA is generally detectable in upper respiratory specimens during the acute phase of infection. The lowest concentration of SARS-CoV-2 viral copies this assay can detect is 138 copies/mL. A negative result does not preclude SARS-Cov-2 infection and should not be used as the sole basis for treatment or other patient management decisions. A negative result may occur with  improper specimen collection/handling, submission of specimen other than nasopharyngeal swab, presence of viral mutation(s) within the areas targeted by this assay, and inadequate number of viral copies(<138 copies/mL). A negative result must be combined with clinical observations, patient history, and epidemiological information. The expected result is Negative.  Fact Sheet for Patients:  BloggerCourse.com  Fact Sheet for Healthcare Providers:  SeriousBroker.it  This test is no t yet approved or cleared by the [place] FDA and  has been  authorized for detection and/or diagnosis of SARS-CoV-2 by FDA under an Emergency Use Authorization (EUA). This EUA will remain  in effect (meaning this test can be used) for the duration of the COVID-19 declaration under Section 564(b)(1) of the Act, 21 U.S.C.section 360bbb-3(b)(1), unless the authorization is terminated  or revoked sooner.       Influenza A by PCR NEGATIVE NEGATIVE Final   Influenza B by PCR NEGATIVE NEGATIVE Final    Comment: (NOTE) The Xpert Xpress SARS-CoV-2/FLU/RSV plus assay is intended as an aid in the diagnosis of influenza from Nasopharyngeal swab specimens and should not be used as a sole basis for treatment. Nasal washings and aspirates are unacceptable for Xpert Xpress SARS-CoV-2/FLU/RSV testing.  Fact Sheet for Patients: BloggerCourse.com  Fact Sheet for Healthcare Providers: SeriousBroker.it  This test is not yet approved or cleared by the [place] FDA and has been authorized for detection and/or diagnosis of SARS-CoV-2 by FDA under an Emergency Use Authorization (EUA). This EUA will remain in effect (meaning this test can be used) for the duration of the COVID-19 declaration under Section 564(b)(1) of the Act, 21 U.S.C. section 360bbb-3(b)(1), unless the authorization is terminated or revoked.  Performed at [facility], [address] [place], [place] [address]   Culture, blood (Routine X 2) w Reflex to ID Panel     Status: None (Preliminary result)   Collection Time: 12/14/20  3:12 AM   Specimen: BLOOD LEFT HAND  Result Value Ref Range Status   Specimen Description BLOOD LEFT HAND  Final   Special Requests   Final    BOTTLES DRAWN AEROBIC AND ANAEROBIC Blood Culture adequate volume   Culture   Final    NO GROWTH 1 DAY Performed at [facility], [address] [place], [place] [address]    Report Status PENDING  Incomplete         Radiology Studies: DG Tibia/Fibula  Left  Result Date: 12/14/2020 CLINICAL DATA:  Cellulitis. Post-polio syndrome, osteogenesis imperfecta. EXAM: LEFT TIBIA AND FIBULA - 2 VIEW COMPARISON:  None. FINDINGS: Intramedullary rod is partially visualized within the visualized distal femur. The distal femur is relatively gracile in keeping with the patient's given history. The osseous structures are diffusely osteopenic. The tibia demonstrates anteromedial bowing and, similarly, demonstrates diffuse demineralization and a relatively gracile configuration in keeping with the patient's given history. There is  a healing transverse fracture of the proximal metadiaphysis with fracture fragments in near anatomic alignment. Bridging callus identified. Distally, healed posterior and medial malleolar fractures are noted with normal overall alignment and residual surgical hardware. Healing fractures of the proximal and mid to distal third of the diaphysis of the fibula are identified with fracture fragments in anatomic alignment. Bridging callus noted at both fractures. There is synostosis of the distal third of the diaphysis of the tibia and fibula in the region of the a healing fracture. Residual widening of the distal tibia fibular articulation. The visualized osseous structures of the hindfoot are diffusely osteopenic. Pes planus deformity noted. Small plantar calcaneal spur noted. There is diffuse subcutaneous edema involving the left lower extremity with more focal soft tissue swelling involving the visualized dorsum of the left midfoot and superficial to the medial malleolus. No superimposed osseous erosion identified. IMPRESSION: Soft tissue swelling.  No superimposed osseous erosion. Healing fractures of the proximal tibia and mid and distal diaphysis of the fibula. Chronic changes related to the patient's underlying osteogenesis imperfecta as outlined above. Electronically Signed   By: Helyn NumbersAshesh  Parikh MD   On: 12/14/2020 01:31        Scheduled  Meds: . aspirin  81 mg Oral Daily  . enoxaparin (LOVENOX) injection  40 mg Subcutaneous Q24H  . glipiZIDE  10 mg Oral QHS  . insulin aspart  0-15 Units Subcutaneous TID WC  . linagliptin  5 mg Oral QHS  . lisinopril  20 mg Oral Daily  . metFORMIN  1,000 mg Oral QHS  . methocarbamol  500 mg Oral QHS  . simvastatin  20 mg Oral QPM   Continuous Infusions: . ceFEPime (MAXIPIME) IV Stopped (12/15/20 0915)  . vancomycin Stopped (12/15/20 0742)     LOS: 0 days    Time spent: 30 minutes    Dorcas CarrowKuber Shawnese Magner, MD [facility] Pager [phone]

## 2020-12-16 ENCOUNTER — Inpatient Hospital Stay (HOSPITAL_COMMUNITY): Payer: Medicare Other

## 2020-12-16 DIAGNOSIS — L03116 Cellulitis of left lower limb: Secondary | ICD-10-CM | POA: Diagnosis not present

## 2020-12-16 LAB — CBC WITH DIFFERENTIAL/PLATELET
Abs Immature Granulocytes: 0.06 10*3/uL (ref 0.00–0.07)
Basophils Absolute: 0.1 10*3/uL (ref 0.0–0.1)
Basophils Relative: 0 %
Eosinophils Absolute: 0.4 10*3/uL (ref 0.0–0.5)
Eosinophils Relative: 4 %
HCT: 41.6 % (ref 39.0–52.0)
Hemoglobin: 12.9 g/dL — ABNORMAL LOW (ref 13.0–17.0)
Immature Granulocytes: 1 %
Lymphocytes Relative: 24 %
Lymphs Abs: 2.8 10*3/uL (ref 0.7–4.0)
MCH: 28.2 pg (ref 26.0–34.0)
MCHC: 31 g/dL (ref 30.0–36.0)
MCV: 91 fL (ref 80.0–100.0)
Monocytes Absolute: 1.1 10*3/uL — ABNORMAL HIGH (ref 0.1–1.0)
Monocytes Relative: 10 %
Neutro Abs: 7.2 10*3/uL (ref 1.7–7.7)
Neutrophils Relative %: 61 %
Platelets: 290 10*3/uL (ref 150–400)
RBC: 4.57 MIL/uL (ref 4.22–5.81)
RDW: 15.1 % (ref 11.5–15.5)
WBC: 11.6 10*3/uL — ABNORMAL HIGH (ref 4.0–10.5)
nRBC: 0 % (ref 0.0–0.2)

## 2020-12-16 LAB — GLUCOSE, CAPILLARY
Glucose-Capillary: 171 mg/dL — ABNORMAL HIGH (ref 70–99)
Glucose-Capillary: 266 mg/dL — ABNORMAL HIGH (ref 70–99)
Glucose-Capillary: 206 mg/dL — ABNORMAL HIGH (ref 70–99)
Glucose-Capillary: 240 mg/dL — ABNORMAL HIGH (ref 70–99)

## 2020-12-16 LAB — CREATININE, SERUM
Creatinine, Ser: 1.04 mg/dL (ref 0.61–1.24)
GFR, Estimated: >60 mL/min (ref >60)

## 2020-12-16 NOTE — Care Management Important Message (Signed)
Important Message  Patient Details  Name: Kurt [name] MRN: [id number] Date of Birth: 1951/12/01   Medicare Important Message Given:  Yes     Corey Harold 12/16/2020, 1:17 PM

## 2020-12-16 NOTE — TOC Initial Note (Signed)
Transition of Care [facility]) - Initial/Assessment Note   Patient Details  Name: Kurt [name] MRN: [id number] Date of Birth: 1951/03/09  Transition of Care [facility]) CM/SW Contact:    Ewing Schlein, LCSW Phone Number: 12/16/2020, 4:30 PM  Clinical Narrative: Patient is a 69 year old male who was admitted for cellulitis of left lower extremity. HHRN is recommended to manage the cellulitis at home. CSW made referral to [facility] with [facility] for [facility]. [facility] accepted referral. TOC to follow.  Expected Discharge Plan: Home w Home Health Services Barriers to Discharge: Continued Medical Work up  Patient Goals and CMS Choice Patient states their goals for this hospitalization and ongoing recovery are:: Get [facility] to assist with leg wound CMS Medicare.gov Compare Post Acute Care list provided to:: Patient Choice offered to / list presented to : Patient  Expected Discharge Plan and Services Expected Discharge Plan: Home w Home Health Services In-house Referral: Clinical Social Work Discharge Planning Services: NA Post Acute Care Choice: Home Health Living arrangements for the past 2 months: Single Family Home              DME Arranged: N/A DME Agency: NA HH Arranged: RN HH Agency: [facility] Date HH Agency Contacted: 12/16/20 Representative spoke with at [facility] Agency: Melissa  Prior Living Arrangements/Services Living arrangements for the past 2 months: Single Family Home Lives with:: Self Patient language and need for interpreter reviewed:: Yes Do you feel safe going back to the place where you live?: Yes      Need for Family Participation in Patient Care: No (Comment) Care giver support system in place?: Yes (comment) Criminal Activity/Legal Involvement Pertinent to Current Situation/Hospitalization: No - Comment as needed  Activities of Daily Living Home Assistive Devices/Equipment: Walker (specify type),Eyeglasses ADL Screening (condition at time of admission) Patient's  cognitive ability adequate to safely complete daily activities?: Yes Is the patient deaf or have difficulty hearing?: No Does the patient have difficulty seeing, even when wearing glasses/contacts?: No Does the patient have difficulty concentrating, remembering, or making decisions?: No Patient able to express need for assistance with ADLs?: Yes Does the patient have difficulty dressing or bathing?: No Independently performs ADLs?: Yes (appropriate for developmental age) Does the patient have difficulty walking or climbing stairs?: Yes Weakness of Legs: Both Weakness of Arms/Hands: None  Permission Sought/Granted Permission sought to share information with : Other (comment) Permission granted to share info w AGENCY: HH companies  Emotional Assessment Appearance:: Appears stated age Attitude/Demeanor/Rapport: Ambitious Affect (typically observed): Accepting Orientation: : Oriented to Self,Oriented to Place,Oriented to  Time,Oriented to Situation Alcohol / Substance Use: Not Applicable Psych Involvement: No (comment)  Admission diagnosis:  Cellulitis of left lower extremity [L03.116] Cellulitis and abscess of leg [L03.119, L02.419] Patient Active Problem List   Diagnosis Date Noted  . Cellulitis and abscess of leg 12/15/2020  . Cellulitis of left lower extremity 12/14/2020  . Hypertension   . 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 Cellulitis 02/24/2019  . 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 Osteogenesis imperfecta 12/25/2018  . Type 2 diabetes mellitus (HCC) 12/25/2018   PCP:  Altamease Oiler, FNP Pharmacy:   [facility] [place]. - [place], [place] - [address] [place] [address] Phone: [phone] Fax: [phone]  [facility] [phone] - [place],  - [address] AT [facility] [address] [place] [address] Phone: [phone] Fax: [phone]  Readmission Risk Interventions No  flowsheet  data found.

## 2020-12-16 NOTE — Progress Notes (Signed)
PROGRESS NOTE    Kurt [name]  QZE:[id number] DOB: [birth year] DOA: 12/14/2020 PCP: Altamease Oiler, FNP    Brief Narrative:  69 year old gentleman with history of osteoarthritis, bronchitis, GERD, hypertension, osteogenesis imperfecta, post polio syndrome with right hemiparesis and type 2 diabetes presented to the ER with progressive worse left lower pretibial area swelling and tenderness.  Also redness and swelling on the right leg.  In the emergency room, hemodynamically stable.  Left leg with significant spreading cellulitis.   Assessment & Plan:   Principal Problem:   Cellulitis of left lower extremity Active Problems:   Post-polio syndrome   Osteogenesis imperfecta   Type 2 diabetes mellitus (HCC)   Hypertension   Cellulitis and abscess of leg  Bilateral lower extremity cellulitis, spreading cellulitis of the left leg more than the right leg with underlying chronic lymphedema: -Patient has some clinical response with IV antibiotics.   -Continue vancomycin and cefepime today, pharmacy helping with dosing.  Mobilize. His wounds are developing blisters today.  He has underlying hardware.  -Compression, elevation of the leg.  He will need ongoing compression for chronic management of lymphedema on the left leg.  Seen by wound care.  Local wound care on the posterior aspect of the leg. -Needs to rule out subacute osteomyelitis/underlying abscess, allergy to iodine.  We will try MRI of the left leg.  He has multiple hardware in his body, had MRI done 1 year ago without trouble. Will talk to MRI, if unable, will do CT scan with steroid prep.  Type 2 diabetes: On insulin and glipizide at home.  Continued.  Hypertension: Blood pressures fairly stable on current regimen.  Post polio syndrome, right hemiparesis: Uses walker.  Mobility improved.   DVT prophylaxis: enoxaparin (LOVENOX) injection 40 mg Start: 12/14/20 0600   Code Status: Full code Family Communication:  None Disposition Plan: Status is: Inpatient  Remains inpatient appropriate because:Inpatient level of care appropriate due to severity of illness   Dispo: The patient is from: Home              Anticipated d/c is to: Home with home health.  Will need wound care.              Anticipated d/c date is: Advertising account executive.              Patient currently is not medically stable to d/c.         Consultants:   None  Procedures:   None  Antimicrobials:  Antibiotics Given (last 72 hours)    Date/Time Action Medication Dose Rate   12/14/20 0351 New Bag/Given   piperacillin-tazobactam (ZOSYN) IVPB 3.375 g 3.375 g 100 mL/hr   12/14/20 0419 New Bag/Given   vancomycin (VANCOREADY) IVPB 2000 mg/400 mL 2,000 mg 200 mL/hr   12/14/20 0739 New Bag/Given   ceFEPIme (MAXIPIME) 2 g in sodium chloride 0.9 % 100 mL IVPB 2 g 200 mL/hr   12/14/20 1719 New Bag/Given   ceFEPIme (MAXIPIME) 2 g in sodium chloride 0.9 % 100 mL IVPB 2 g 200 mL/hr   12/15/20 0016 New Bag/Given   ceFEPIme (MAXIPIME) 2 g in sodium chloride 0.9 % 100 mL IVPB 2 g 200 mL/hr   12/15/20 0542 New Bag/Given   vancomycin (VANCOREADY) IVPB 1750 mg/350 mL 1,750 mg 175 mL/hr   12/15/20 0845 New Bag/Given   ceFEPIme (MAXIPIME) 2 g in sodium chloride 0.9 % 100 mL IVPB 2 g 200 mL/hr   12/15/20 1554 New Bag/Given   ceFEPIme (  MAXIPIME) 2 g in sodium chloride 0.9 % 100 mL IVPB 2 g 200 mL/hr   12/15/20 2333 New Bag/Given   ceFEPIme (MAXIPIME) 2 g in sodium chloride 0.9 % 100 mL IVPB 2 g 200 mL/hr   12/16/20 0537 New Bag/Given   vancomycin (VANCOREADY) IVPB 1750 mg/350 mL 1,750 mg 175 mL/hr   12/16/20 0810 New Bag/Given   ceFEPIme (MAXIPIME) 2 g in sodium chloride 0.9 % 100 mL IVPB 2 g 200 mL/hr         Subjective: Patient seen and examined.  No overnight events.  He feels slightly better on his left leg.  Afebrile overnight.  He is worried about doing dressing changes at home as he lives by himself.  Objective: Vitals:   12/15/20 0843  12/15/20 1555 12/15/20 2031 12/16/20 0535  BP: 119/75 119/68 128/72 (!) 102/56  Pulse: 77 83 82 87  Resp:  18 20 18   Temp:  98.3 F (36.8 C) 98.4 F (36.9 C) 98.5 F (36.9 C)  TempSrc:  Oral Oral Oral  SpO2:  98% 98% 96%  Weight:      Height:        Intake/Output Summary (Last 24 hours) at 12/16/2020 1154 Last data filed at 12/16/2020 1000 Gross per 24 hour  Intake 100 ml  Output 800 ml  Net -700 ml   Filed Weights   12/13/20 1833  Weight: 95.7 kg    Examination:  General exam: Appears calm and comfortable  On room air.  Looks comfortable. Respiratory system: Clear to auscultation. Respiratory effort normal. Cardiovascular system: S1 & S2 heard, RRR.  Gastrointestinal system: Soft nontender. Central nervous system: Alert and oriented. No focal neurological deficits. Extremities: Atrophied right arm and right leg. Receding redness and erythema right leg. Redness, erythema, 2+ edema with no underlying fluctuation noted on right leg, blistering of the surrounding skin.  Some dryness present, however no open abscesses.  Data Reviewed: I have personally reviewed following labs and imaging studies  CBC: Recent Labs  Lab 12/14/20 0108 12/15/20 0437 12/16/20 0442  WBC 11.3* 10.8* 11.6*  NEUTROABS 6.1 6.8 7.2  HGB 15.0 13.6 12.9*  HCT 49.8 43.4 41.6  MCV 94.7 91.6 91.0  PLT 321 289 290   Basic Metabolic Panel: Recent Labs  Lab 12/14/20 0108 12/15/20 0437 12/16/20 0442  NA 136 134*  --   K 4.0 4.2  --   CL 104 104  --   CO2 22 21*  --   GLUCOSE 117* 127*  --   BUN 13 15  --   CREATININE 0.88 0.91 1.04  CALCIUM 9.3 8.7*  --    GFR: Estimated Creatinine Clearance: 70.3 mL/min (by C-G formula based on SCr of 1.04 mg/dL). Liver Function Tests: Recent Labs  Lab 12/14/20 0108 12/15/20 0437  AST 18 14*  ALT 24 19  ALKPHOS 65 58  BILITOT 0.9 1.0  PROT 7.4 6.4*  ALBUMIN 4.3 3.5   No results for input(s): LIPASE, AMYLASE in the last 168 hours. No results  for input(s): AMMONIA in the last 168 hours. Coagulation Profile: No results for input(s): INR, PROTIME in the last 168 hours. Cardiac Enzymes: No results for input(s): CKTOTAL, CKMB, CKMBINDEX, TROPONINI in the last 168 hours. BNP (last 3 results) No results for input(s): PROBNP in the last 8760 hours. HbA1C: No results for input(s): HGBA1C in the last 72 hours. CBG: Recent Labs  Lab 12/15/20 1133 12/15/20 1635 12/15/20 2035 12/16/20 0729 12/16/20 1143  GLUCAP 237* 148* 266*  171* 266*   Lipid Profile: No results for input(s): CHOL, HDL, LDLCALC, TRIG, CHOLHDL, LDLDIRECT in the last 72 hours. Thyroid Function Tests: No results for input(s): TSH, T4TOTAL, FREET4, T3FREE, THYROIDAB in the last 72 hours. Anemia Panel: No results for input(s): VITAMINB12, FOLATE, FERRITIN, TIBC, IRON, RETICCTPCT in the last 72 hours. Sepsis Labs: Recent Labs  Lab 12/14/20 0108 12/14/20 [id number]  LATICACIDVEN 1.6 1.5    Recent Results (from the past 240 hour(s))  Culture, blood (Routine X 2) w Reflex to ID Panel     Status: None (Preliminary result)   Collection Time: 12/14/20  1:08 AM   Specimen: BLOOD  Result Value Ref Range Status   Specimen Description BLOOD LEFT ANTECUBITAL  Final   Special Requests   Final    BOTTLES DRAWN AEROBIC AND ANAEROBIC Blood Culture adequate volume   Culture   Final    NO GROWTH 2 DAYS Performed at [facility], [address] [place], [place] [address]    Report Status PENDING  Incomplete  Resp Panel by RT-PCR (Flu A&B, Covid) Nasopharyngeal Swab     Status: None   Collection Time: 12/14/20  1:32 AM   Specimen: Nasopharyngeal Swab; Nasopharyngeal(NP) swabs in vial transport medium  Result Value Ref Range Status   SARS Coronavirus 2 by RT PCR NEGATIVE NEGATIVE Final    Comment: (NOTE) SARS-CoV-2 target nucleic acids are NOT DETECTED.  The SARS-CoV-2 RNA is generally detectable in upper respiratory specimens during the acute phase of infection. The  lowest concentration of SARS-CoV-2 viral copies this assay can detect is 138 copies/mL. A negative result does not preclude SARS-Cov-2 infection and should not be used as the sole basis for treatment or other patient management decisions. A negative result may occur with  improper specimen collection/handling, submission of specimen other than nasopharyngeal swab, presence of viral mutation(s) within the areas targeted by this assay, and inadequate number of viral copies(<138 copies/mL). A negative result must be combined with clinical observations, patient history, and epidemiological information. The expected result is Negative.  Fact Sheet for Patients:  BloggerCourse.comhttps://www.fda.gov/media/152166/download  Fact Sheet for Healthcare Providers:  SeriousBroker.ithttps://www.fda.gov/media/152162/download  This test is no t yet approved or cleared by the [place] FDA and  has been authorized for detection and/or diagnosis of SARS-CoV-2 by FDA under an Emergency Use Authorization (EUA). This EUA will remain  in effect (meaning this test can be used) for the duration of the COVID-19 declaration under Section 564(b)(1) of the Act, 21 U.S.C.section 360bbb-3(b)(1), unless the authorization is terminated  or revoked sooner.       Influenza A by PCR NEGATIVE NEGATIVE Final   Influenza B by PCR NEGATIVE NEGATIVE Final    Comment: (NOTE) The Xpert Xpress SARS-CoV-2/FLU/RSV plus assay is intended as an aid in the diagnosis of influenza from Nasopharyngeal swab specimens and should not be used as a sole basis for treatment. Nasal washings and aspirates are unacceptable for Xpert Xpress SARS-CoV-2/FLU/RSV testing.  Fact Sheet for Patients: BloggerCourse.comhttps://www.fda.gov/media/152166/download  Fact Sheet for Healthcare Providers: SeriousBroker.ithttps://www.fda.gov/media/152162/download  This test is not yet approved or cleared by the [place] FDA and has been authorized for detection and/or diagnosis of SARS-CoV-2 by FDA under  an Emergency Use Authorization (EUA). This EUA will remain in effect (meaning this test can be used) for the duration of the COVID-19 declaration under Section 564(b)(1) of the Act, 21 U.S.C. section 360bbb-3(b)(1), unless the authorization is terminated or revoked.  Performed at [facility], [address] [place], [place] [address]  Culture, blood (Routine X 2) w Reflex to ID Panel     Status: None (Preliminary result)   Collection Time: 12/14/20  3:12 AM   Specimen: BLOOD LEFT HAND  Result Value Ref Range Status   Specimen Description BLOOD LEFT HAND  Final   Special Requests   Final    BOTTLES DRAWN AEROBIC AND ANAEROBIC Blood Culture adequate volume   Culture   Final    NO GROWTH 2 DAYS Performed at [facility], [address], [place], [place] [address]    Report Status PENDING  Incomplete         Radiology Studies: No results found.      Scheduled Meds: . aspirin  81 mg Oral Daily  . enoxaparin (LOVENOX) injection  40 mg Subcutaneous Q24H  . glipiZIDE  10 mg Oral QHS  . insulin aspart  0-15 Units Subcutaneous TID WC  . linagliptin  5 mg Oral QHS  . lisinopril  20 mg Oral Daily  . metFORMIN  1,000 mg Oral QHS  . methocarbamol  500 mg Oral QHS  . simvastatin  20 mg Oral QPM   Continuous Infusions: . ceFEPime (MAXIPIME) IV 2 g (12/16/20 0810)  . vancomycin 1,750 mg (12/16/20 0537)     LOS: 1 day    Time spent: 30 minutes    Dorcas Carrow, MD [facility] Pager [phone]

## 2020-12-17 DIAGNOSIS — L03116 Cellulitis of left lower limb: Secondary | ICD-10-CM | POA: Diagnosis not present

## 2020-12-17 LAB — CBC WITH DIFFERENTIAL/PLATELET
Abs Immature Granulocytes: 0.05 10*3/uL (ref 0.00–0.07)
Basophils Absolute: 0.1 10*3/uL (ref 0.0–0.1)
Basophils Relative: 1 %
Eosinophils Absolute: 0.3 10*3/uL (ref 0.0–0.5)
Eosinophils Relative: 3 %
HCT: 40.9 % (ref 39.0–52.0)
Hemoglobin: 12.9 g/dL — ABNORMAL LOW (ref 13.0–17.0)
Immature Granulocytes: 1 %
Lymphocytes Relative: 23 %
Lymphs Abs: 2.5 10*3/uL (ref 0.7–4.0)
MCH: 28.7 pg (ref 26.0–34.0)
MCHC: 31.5 g/dL (ref 30.0–36.0)
MCV: 91.1 fL (ref 80.0–100.0)
Monocytes Absolute: 1.1 10*3/uL — ABNORMAL HIGH (ref 0.1–1.0)
Monocytes Relative: 10 %
Neutro Abs: 6.9 10*3/uL (ref 1.7–7.7)
Neutrophils Relative %: 62 %
Platelets: 294 10*3/uL (ref 150–400)
RBC: 4.49 MIL/uL (ref 4.22–5.81)
RDW: 15.2 % (ref 11.5–15.5)
WBC: 10.9 10*3/uL — ABNORMAL HIGH (ref 4.0–10.5)
nRBC: 0 % (ref 0.0–0.2)

## 2020-12-17 LAB — GLUCOSE, CAPILLARY: Glucose-Capillary: 149 mg/dL — ABNORMAL HIGH (ref 70–99)

## 2020-12-17 MED ORDER — CIPROFLOXACIN HCL 500 MG PO TABS: 500.0000 mg | ORAL_TABLET | Freq: Two times a day (BID) | ORAL | 0 refills | Status: AC

## 2020-12-17 MED ORDER — CIPROFLOXACIN HCL 500 MG PO TABS: 500.0000 mg | ORAL_TABLET | Freq: Two times a day (BID) | ORAL | 0 refills | Status: DC

## 2020-12-17 NOTE — Discharge Summary (Addendum)
Physician Discharge Summary  Kurt [name] IHK:[id number] DOB: 12/11/1951 DOA: 12/14/2020  PCP: Joyice Faster, FNP  Admit date: 12/14/2020 Discharge date: 12/17/2020  Admitted From: Home Disposition: Home with home health  Recommendations for Outpatient Follow-up:  1. Follow up with PCP in 1-2 weeks 2. Wound dressing instructions as below.  Home health RN to evaluate left leg wound Monday.  Home Health: RN Equipment/Devices: None.  Already present at home  Discharge Condition: Stable CODE STATUS: Full code Diet recommendation: Low-salt and low-carb diet  Discharge summary: 70 year old gentleman with history of osteoarthritis, GERD, hypertension, osteogenesis imperfecta, post polio syndrome with right hemiparesis and type 2 diabetes presented to the ER with progressive worse left lower pretibial area swelling and tenderness.  Also redness and swelling on the right leg.  In the emergency room, hemodynamically stable.  Left leg with significant spreading cellulitis.  Admitted to hospital with spreading cellulitis of the leg in a diabetic patient.  Bilateral lower extremity cellulitis, spreading cellulitis of the left leg more than the right leg with underlying chronic lymphedema: -Patient has some clinical improvement with antibiotic treatment including vancomycin and cefepime that he received for 4 days. -Remains afebrile.  WBC count normal.  Blood cultures negative. -A CT scan of the leg with no evidence of abscess, intact hardware and no evidence of osteomyelitis. -Patient has allergy to penicillin, will treat with 10 days of ciprofloxacin to cover for polymicrobial infection in a patient with diabetes.  He is able to go home.  Will need ongoing wound care and evaluation that will be done by home health RN at home.  He has good clinical response to the treatment, he still has some remaining superficial blistering and infection, patient will need ongoing supervision and assessment  of wound at home and referred to wound care clinic if needed by home health RN.  Type 2 diabetes: Blood sugars are fairly stable on insulin and glipizide that he will continue.      Discharge Diagnoses:  Principal Problem:   Cellulitis of left lower extremity Active Problems:   Post-polio syndrome   Osteogenesis imperfecta   Type 2 diabetes mellitus (HCC)   Hypertension   Cellulitis and abscess of leg    Discharge Instructions  Discharge Instructions    Call MD for:  redness, tenderness, or signs of infection (pain, swelling, redness, odor or green/yellow discharge around incision site)   Complete by: As directed    Call MD for:  temperature >100.4   Complete by: As directed    Diet - low sodium heart healthy   Complete by: As directed    Discharge wound care:   Complete by: As directed    Apply Xeroform gauze to left leg Q day, then cover with ABD pads and kerlex and an ace wrap, beginning just behind toes to below knee.   Increase activity slowly   Complete by: As directed      Allergies as of 12/17/2020      Reactions   Augmentin [amoxicillin-pot Clavulanate] Nausea And Vomiting   Iodine Hives   Tape Rash   Paper       Medication List    STOP taking these medications   HYDROcodone-acetaminophen 7.5-325 MG tablet Commonly known as: NORCO     TAKE these medications   acetaminophen 650 MG CR tablet Commonly known as: TYLENOL Take 1,300 mg by mouth every 8 (eight) hours as needed for pain.   aspirin 81 MG chewable tablet Chew 1 tablet (81 mg  total) by mouth daily.   ciprofloxacin 500 MG tablet Commonly known as: Cipro Take 1 tablet (500 mg total) by mouth 2 (two) times daily for 10 days.   glipiZIDE 10 MG 24 hr tablet Commonly known as: GLUCOTROL XL Take 10 mg by mouth at bedtime.   Lantus SoloStar 100 UNIT/ML Solostar Pen Generic drug: insulin glargine Inject 34 Units into the skin at bedtime.   lisinopril 20 MG tablet Commonly known as:  ZESTRIL Take 20 mg by mouth daily.   methocarbamol 500 MG tablet Commonly known as: ROBAXIN Take 500 mg by mouth at bedtime as needed for muscle spasms.   OneTouch Ultra test strip Generic drug: glucose blood 1 each 2 (two) times daily.   oxyCODONE 5 MG immediate release tablet Commonly known as: Oxy IR/ROXICODONE Take 1 tablet (5 mg total) by mouth every 8 (eight) hours as needed for moderate pain (pain score 4-6).   simvastatin 20 MG tablet Commonly known as: ZOCOR Take 20 mg by mouth every evening.   SitaGLIPtin-MetFORMIN HCl [phone] MG Tb24 Take 1 tablet by mouth at bedtime.            Discharge Care Instructions  (From admission, onward)         Start     Ordered   12/17/20 0000  Discharge wound care:       Comments: Apply Xeroform gauze to left leg Q day, then cover with ABD pads and kerlex and an ace wrap, beginning just behind toes to below knee.   12/17/20 0839          Follow-up Information    [facility] Follow up.   Specialty: Home Health Services Why: [facility]             Allergies  Allergen Reactions  . Augmentin [Amoxicillin-Pot Clavulanate] Nausea And Vomiting  . Iodine Hives  . Tape Rash    Paper     Consultations:  None.   Procedures/Studies: DG Tibia/Fibula Left  Result Date: 12/14/2020 CLINICAL DATA:  Cellulitis. Post-polio syndrome, osteogenesis imperfecta. EXAM: LEFT TIBIA AND FIBULA - 2 VIEW COMPARISON:  None. FINDINGS: Intramedullary rod is partially visualized within the visualized distal femur. The distal femur is relatively gracile in keeping with the patient's given history. The osseous structures are diffusely osteopenic. The tibia demonstrates anteromedial bowing and, similarly, demonstrates diffuse demineralization and a relatively gracile configuration in keeping with the patient's given history. There is a healing transverse fracture of the proximal metadiaphysis with fracture fragments in near  anatomic alignment. Bridging callus identified. Distally, healed posterior and medial malleolar fractures are noted with normal overall alignment and residual surgical hardware. Healing fractures of the proximal and mid to distal third of the diaphysis of the fibula are identified with fracture fragments in anatomic alignment. Bridging callus noted at both fractures. There is synostosis of the distal third of the diaphysis of the tibia and fibula in the region of the a healing fracture. Residual widening of the distal tibia fibular articulation. The visualized osseous structures of the hindfoot are diffusely osteopenic. Pes planus deformity noted. Small plantar calcaneal spur noted. There is diffuse subcutaneous edema involving the left lower extremity with more focal soft tissue swelling involving the visualized dorsum of the left midfoot and superficial to the medial malleolus. No superimposed osseous erosion identified. IMPRESSION: Soft tissue swelling.  No superimposed osseous erosion. Healing fractures of the proximal tibia and mid and distal diaphysis of the fibula. Chronic changes related to the patient's underlying  osteogenesis imperfecta as outlined above. Electronically Signed   By: Helyn Numbers MD   On: 12/14/2020 01:31   Intravitreal Injection, Pharmacologic Agent - OD - Right Eye  Result Date: 12/08/2020 Time Out 12/07/2020. 3:06 PM. Confirmed correct patient, procedure, site, and patient consented. Anesthesia Topical anesthesia was used. Anesthetic medications included Lidocaine 2%, Proparacaine 0.5%. Procedure Preparation included 5% betadine to ocular surface, eyelid speculum. A (32 g) needle was used. Injection: 2 mg aflibercept Gretta Cool) SOLN   NDC: L6038910, Lot: [id number], Expiration date: 02/14/2021   Route: Intravitreal, Site: Right Eye, Waste: 0.05 mL Post-op Post injection exam found visual acuity of at least counting fingers. The patient tolerated the procedure well. There were no  complications. The patient received written and verbal post procedure care education.   OCT, Retina - OU - Both Eyes  Result Date: 12/08/2020 Right Eye Quality was good. Central Foveal Thickness: 239. Progression has improved. Findings include no SRF, intraretinal fluid, retinal drusen , normal foveal contour (interval improvement in SN edema/IRF). Left Eye Quality was good. Central Foveal Thickness: 255. Progression has been stable. Findings include normal foveal contour, no IRF, no SRF, vitreomacular adhesion , retinal drusen . Notes *Images captured and stored on drive Diagnosis / Impression: OD: BRVO with interval improvement in SN edema/IRF OS: NFP, no SRF/IRF Clinical management: See below Abbreviations: NFP - Normal foveal profile. CME - cystoid macular edema. PED - pigment epithelial detachment. IRF - intraretinal fluid. SRF - subretinal fluid. EZ - ellipsoid zone. ERM - epiretinal membrane. ORA - outer retinal atrophy. ORT - outer retinal tubulation. SRHM - subretinal hyper-reflective material  CT EXTREMITY LOWER LEFT WO CONTRAST  Result Date: 12/16/2020 CLINICAL DATA:  Onset left lower leg pain, redness and blistering 12/11/2020. EXAM: CT OF THE LOWER LEFT EXTREMITY WITHOUT CONTRAST TECHNIQUE: Multidetector CT imaging of the lower left extremity was performed according to the standard protocol. COMPARISON:  Plain films left hip fib 12/14/2020. FINDINGS: Bones/Joint/Cartilage No bony destructive change or periosteal reaction is identified. Bones are osteopenic. The patient has remote healed fractures of the tibia and fibula. There is fracture fixation hardware in place in distal tibia. No joint effusion. Osteoarthritis about the and visualized hindfoot noted. Intramedullary nail in the distal femur is partially visualized. Ligaments Suboptimally assessed by CT. Muscles and Tendons No intramuscular fluid collection or gas. There is diffuse atrophy of lower leg musculature, worst in the posterior  compartment where it is severe. Soft tissues No focal fluid collection is identified. Subcutaneous edema is progressive distally. Scattered blisters are noted, most conspicuous anterior to the distal tibia. IMPRESSION: Scattered blisters. Subcutaneous edema is progressive distally down the lower leg and could be due to dependent change and/or cellulitis. Negative for evidence of myositis, osteomyelitis, abscess or septic joint. Healed fractures of the tibia and fibula. Osteopenia. Osteoarthritis of the knee and visualized hindfoot. Atrophy of lower leg musculature is severe in the posterior compartment and could be due to disuse or denervation. Negative for muscle or tendon tear. Electronically Signed   By: Drusilla Kanner M.D.   On: 12/16/2020 15:00    (Echo, Carotid, EGD, Colonoscopy, ERCP)    Subjective: Patient seen and examined.  No overnight events.  Afebrile.  Feels less pressure on his left leg.  Right leg has improved and does not have any complaint.  He is comfortable going home and managing his leg wounds.   Discharge Exam: Vitals:   12/16/20 2057 12/17/20 0606  BP: 113/72 133/80  Pulse: 82 80  Resp:  18 20  Temp: 98.7 F (37.1 C) 98 F (36.7 C)  SpO2: 97% 97%   Vitals:   12/15/20 2031 12/16/20 0535 12/16/20 2057 12/17/20 0606  BP: 128/72 (!) 102/56 113/72 133/80  Pulse: 82 87 82 80  Resp: 20 18 18 20   Temp: 98.4 F (36.9 C) 98.5 F (36.9 C) 98.7 F (37.1 C) 98 F (36.7 C)  TempSrc: Oral Oral Oral Oral  SpO2: 98% 96% 97% 97%  Weight:      Height:        General: Pt is alert, awake, not in acute distress Cardiovascular: RRR, S1/S2 +, no rubs, no gallops Respiratory: CTA bilaterally, no wheezing, no rhonchi Abdominal: Soft, NT, ND, bowel sounds + Extremities:  Right leg: Improved erythema, chronic pigmentation and skin changes present.  He does have atrophy right leg. Left leg: Receding erythema, superficial blister present on lower end of the front of the leg, has  some skin drainage but no collectible abscess or fluctuation. Pictures attached for comparison.  The day of admission ,     12/17/20        The results of significant diagnostics from this hospitalization (including imaging, microbiology, ancillary and laboratory) are listed below for reference.     Microbiology: Recent Results (from the past 240 hour(s))  Culture, blood (Routine X 2) w Reflex to ID Panel     Status: None (Preliminary result)   Collection Time: 12/14/20  1:08 AM   Specimen: BLOOD  Result Value Ref Range Status   Specimen Description BLOOD LEFT ANTECUBITAL  Final   Special Requests   Final    BOTTLES DRAWN AEROBIC AND ANAEROBIC Blood Culture adequate volume   Culture   Final    NO GROWTH 3 DAYS Performed at [facility], [address] [place], [place] [address]    Report Status PENDING  Incomplete  Resp Panel by RT-PCR (Flu A&B, Covid) Nasopharyngeal Swab     Status: None   Collection Time: 12/14/20  1:32 AM   Specimen: Nasopharyngeal Swab; Nasopharyngeal(NP) swabs in vial transport medium  Result Value Ref Range Status   SARS Coronavirus 2 by RT PCR NEGATIVE NEGATIVE Final    Comment: (NOTE) SARS-CoV-2 target nucleic acids are NOT DETECTED.  The SARS-CoV-2 RNA is generally detectable in upper respiratory specimens during the acute phase of infection. The lowest concentration of SARS-CoV-2 viral copies this assay can detect is 138 copies/mL. A negative result does not preclude SARS-Cov-2 infection and should not be used as the sole basis for treatment or other patient management decisions. A negative result may occur with  improper specimen collection/handling, submission of specimen other than nasopharyngeal swab, presence of viral mutation(s) within the areas targeted by this assay, and inadequate number of viral copies(<138 copies/mL). A negative result must be combined with clinical observations, patient history, and epidemiological information.  The expected result is Negative.  Fact Sheet for Patients:  BloggerCourse.comhttps://www.fda.gov/media/152166/download  Fact Sheet for Healthcare Providers:  SeriousBroker.ithttps://www.fda.gov/media/152162/download  This test is no t yet approved or cleared by the [place] FDA and  has been authorized for detection and/or diagnosis of SARS-CoV-2 by FDA under an Emergency Use Authorization (EUA). This EUA will remain  in effect (meaning this test can be used) for the duration of the COVID-19 declaration under Section 564(b)(1) of the Act, 21 U.S.C.section 360bbb-3(b)(1), unless the authorization is terminated  or revoked sooner.       Influenza A by PCR NEGATIVE NEGATIVE Final   Influenza B by PCR  NEGATIVE NEGATIVE Final    Comment: (NOTE) The Xpert Xpress SARS-CoV-2/FLU/RSV plus assay is intended as an aid in the diagnosis of influenza from Nasopharyngeal swab specimens and should not be used as a sole basis for treatment. Nasal washings and aspirates are unacceptable for Xpert Xpress SARS-CoV-2/FLU/RSV testing.  Fact Sheet for Patients: BloggerCourse.comhttps://www.fda.gov/media/152166/download  Fact Sheet for Healthcare Providers: SeriousBroker.ithttps://www.fda.gov/media/152162/download  This test is not yet approved or cleared by the [place] FDA and has been authorized for detection and/or diagnosis of SARS-CoV-2 by FDA under an Emergency Use Authorization (EUA). This EUA will remain in effect (meaning this test can be used) for the duration of the COVID-19 declaration under Section 564(b)(1) of the Act, 21 U.S.C. section 360bbb-3(b)(1), unless the authorization is terminated or revoked.  Performed at [facility], [address] [place], [place] [address]   Culture, blood (Routine X 2) w Reflex to ID Panel     Status: None (Preliminary result)   Collection Time: 12/14/20  3:12 AM   Specimen: BLOOD LEFT HAND  Result Value Ref Range Status   Specimen Description BLOOD LEFT HAND  Final   Special Requests   Final     BOTTLES DRAWN AEROBIC AND ANAEROBIC Blood Culture adequate volume   Culture   Final    NO GROWTH 3 DAYS Performed at [facility], [address] [place], [place] [address]    Report Status PENDING  Incomplete     Labs: BNP (last 3 results) No results for input(s): BNP in the last 8760 hours. Basic Metabolic Panel: Recent Labs  Lab 12/14/20 0108 12/15/20 0437 12/16/20 0442  NA 136 134*  --   K 4.0 4.2  --   CL 104 104  --   CO2 22 21*  --   GLUCOSE 117* 127*  --   BUN 13 15  --   CREATININE 0.88 0.91 1.04  CALCIUM 9.3 8.7*  --    Liver Function Tests: Recent Labs  Lab 12/14/20 0108 12/15/20 0437  AST 18 14*  ALT 24 19  ALKPHOS 65 58  BILITOT 0.9 1.0  PROT 7.4 6.4*  ALBUMIN 4.3 3.5   No results for input(s): LIPASE, AMYLASE in the last 168 hours. No results for input(s): AMMONIA in the last 168 hours. CBC: Recent Labs  Lab 12/14/20 0108 12/15/20 0437 12/16/20 0442 12/17/20 0611  WBC 11.3* 10.8* 11.6* 10.9*  NEUTROABS 6.1 6.8 7.2 6.9  HGB 15.0 13.6 12.9* 12.9*  HCT 49.8 43.4 41.6 40.9  MCV 94.7 91.6 91.0 91.1  PLT 321 289 290 294   Cardiac Enzymes: No results for input(s): CKTOTAL, CKMB, CKMBINDEX, TROPONINI in the last 168 hours. BNP: Invalid input(s): POCBNP CBG: Recent Labs  Lab 12/16/20 0729 12/16/20 1143 12/16/20 1638 12/16/20 2057 12/17/20 0816  GLUCAP 171* 266* 206* 240* 149*   D-Dimer No results for input(s): DDIMER in the last 72 hours. Hgb A1c No results for input(s): HGBA1C in the last 72 hours. Lipid Profile No results for input(s): CHOL, HDL, LDLCALC, TRIG, CHOLHDL, LDLDIRECT in the last 72 hours. Thyroid function studies No results for input(s): TSH, T4TOTAL, T3FREE, THYROIDAB in the last 72 hours.  Invalid input(s): FREET3 Anemia work up No results for input(s): VITAMINB12, FOLATE, FERRITIN, TIBC, IRON, RETICCTPCT in the last 72 hours. Urinalysis    Component Value Date/Time   COLORURINE YELLOW 12/13/2020 1835    APPEARANCEUR CLEAR 12/13/2020 1835   LABSPEC 1.010 12/13/2020 1835   PHURINE 5.0 12/13/2020 1835   GLUCOSEU >=500 (A) 12/13/2020  1835   HGBUR NEGATIVE 12/13/2020 1835   BILIRUBINUR NEGATIVE 12/13/2020 1835   KETONESUR NEGATIVE 12/13/2020 1835   PROTEINUR NEGATIVE 12/13/2020 1835   NITRITE NEGATIVE 12/13/2020 1835   LEUKOCYTESUR NEGATIVE 12/13/2020 1835   Sepsis Labs Invalid input(s): PROCALCITONIN,  WBC,  LACTICIDVEN Microbiology Recent Results (from the past 240 hour(s))  Culture, blood (Routine X 2) w Reflex to ID Panel     Status: None (Preliminary result)   Collection Time: 12/14/20  1:08 AM   Specimen: BLOOD  Result Value Ref Range Status   Specimen Description BLOOD LEFT ANTECUBITAL  Final   Special Requests   Final    BOTTLES DRAWN AEROBIC AND ANAEROBIC Blood Culture adequate volume   Culture   Final    NO GROWTH 3 DAYS Performed at [facility], [address], [place], [place] [address]    Report Status PENDING  Incomplete  Resp Panel by RT-PCR (Flu A&B, Covid) Nasopharyngeal Swab     Status: None   Collection Time: 12/14/20  1:32 AM   Specimen: Nasopharyngeal Swab; Nasopharyngeal(NP) swabs in vial transport medium  Result Value Ref Range Status   SARS Coronavirus 2 by RT PCR NEGATIVE NEGATIVE Final    Comment: (NOTE) SARS-CoV-2 target nucleic acids are NOT DETECTED.  The SARS-CoV-2 RNA is generally detectable in upper respiratory specimens during the acute phase of infection. The lowest concentration of SARS-CoV-2 viral copies this assay can detect is 138 copies/mL. A negative result does not preclude SARS-Cov-2 infection and should not be used as the sole basis for treatment or other patient management decisions. A negative result may occur with  improper specimen collection/handling, submission of specimen other than nasopharyngeal swab, presence of viral mutation(s) within the areas targeted by this assay, and inadequate number of viral copies(<138 copies/mL).  A negative result must be combined with clinical observations, patient history, and epidemiological information. The expected result is Negative.  Fact Sheet for Patients:  BloggerCourse.com  Fact Sheet for Healthcare Providers:  SeriousBroker.it  This test is no t yet approved or cleared by the [place] FDA and  has been authorized for detection and/or diagnosis of SARS-CoV-2 by FDA under an Emergency Use Authorization (EUA). This EUA will remain  in effect (meaning this test can be used) for the duration of the COVID-19 declaration under Section 564(b)(1) of the Act, 21 U.S.C.section 360bbb-3(b)(1), unless the authorization is terminated  or revoked sooner.       Influenza A by PCR NEGATIVE NEGATIVE Final   Influenza B by PCR NEGATIVE NEGATIVE Final    Comment: (NOTE) The Xpert Xpress SARS-CoV-2/FLU/RSV plus assay is intended as an aid in the diagnosis of influenza from Nasopharyngeal swab specimens and should not be used as a sole basis for treatment. Nasal washings and aspirates are unacceptable for Xpert Xpress SARS-CoV-2/FLU/RSV testing.  Fact Sheet for Patients: BloggerCourse.com  Fact Sheet for Healthcare Providers: SeriousBroker.it  This test is not yet approved or cleared by the [place] FDA and has been authorized for detection and/or diagnosis of SARS-CoV-2 by FDA under an Emergency Use Authorization (EUA). This EUA will remain in effect (meaning this test can be used) for the duration of the COVID-19 declaration under Section 564(b)(1) of the Act, 21 U.S.C. section 360bbb-3(b)(1), unless the authorization is terminated or revoked.  Performed at [facility], [address] [place], [place] [address]   Culture, blood (Routine X 2) w Reflex to ID Panel     Status: None (Preliminary result)   Collection Time:  12/14/20  3:12 AM   Specimen: BLOOD LEFT HAND   Result Value Ref Range Status   Specimen Description BLOOD LEFT HAND  Final   Special Requests   Final    BOTTLES DRAWN AEROBIC AND ANAEROBIC Blood Culture adequate volume   Culture   Final    NO GROWTH 3 DAYS Performed at [facility], [address], [place], [place] [address]    Report Status PENDING  Incomplete     Time coordinating discharge: 45 minutes  SIGNED:   Dorcas Carrow, MD  [facility] 12/17/2020, 8:39 AM

## 2020-12-17 NOTE — TOC Transition Note (Signed)
Transition of Care [facility]) - CM/SW Discharge Note   Patient Details  Name: Kurt [name] MRN: [id number] Date of Birth: [birth year]  [facility]) CM/SW Contact:  Barry Brunner, LCSW Phone Number: 12/17/2020, 9:00 AM   Clinical Narrative:    CSW notified Barbara Cower with [facility] of patient's discharge. Barbara Cower agreeable to begin services upon patient's discharge. CSW received consult for Lift chair. CSW notified patient that patient would need to obtain a prescription for a lift chair from his PCP. CSW informed patient of [employer] who would be able to [employer] for the cost of the lift chair and provided the contact information in AVS. TOC signing off.    Final next level of care: Home w Home Health Services Barriers to Discharge: Barriers Resolved   Patient Goals and CMS Choice Patient states their goals for this hospitalization and ongoing recovery are:: Return home with [facility] CMS Medicare.gov Compare Post Acute Care list provided to:: Patient Choice offered to / list presented to : Patient  Discharge Placement                    Patient and family notified of of transfer: 12/17/20  Discharge Plan and Services In-house Referral: Clinical Social Work Discharge Planning Services: NA Post Acute Care Choice: Home Health          DME Arranged: N/A DME Agency: NA       HH Arranged: RN HH Agency: [facility] Date HH Agency Contacted: 12/17/20 Time HH Agency Contacted: [phone] Representative spoke with at [facility] Agency: Barbara Cower  Social Determinants of Health (SDOH) Interventions     Readmission Risk Interventions No flowsheet data found.

## 2020-12-19 LAB — CULTURE, BLOOD (ROUTINE X 2)
Culture: NO GROWTH
Culture: NO GROWTH
Special Requests: ADEQUATE
Special Requests: ADEQUATE

## 2020-12-28 ENCOUNTER — Other Ambulatory Visit: Payer: Self-pay | Admitting: Family

## 2020-12-28 ENCOUNTER — Other Ambulatory Visit (HOSPITAL_COMMUNITY): Payer: Self-pay | Admitting: Family

## 2020-12-28 DIAGNOSIS — I809 Phlebitis and thrombophlebitis of unspecified site: Secondary | ICD-10-CM

## 2020-12-29 ENCOUNTER — Ambulatory Visit (HOSPITAL_COMMUNITY)
Admission: RE | Admit: 2020-12-29 | Discharge: 2020-12-29 | Disposition: A | Discharge status: Home / Self Care | Payer: Medicare Other | Source: Ambulatory Visit | Attending: Family | Admitting: Family

## 2020-12-29 ENCOUNTER — Emergency Department (HOSPITAL_COMMUNITY)
Admission: EM | Admit: 2020-12-29 | Discharge: 2020-12-29 | Disposition: A | Discharge status: Home / Self Care | Payer: Medicare Other | Attending: Emergency Medicine | Admitting: Emergency Medicine

## 2020-12-29 ENCOUNTER — Other Ambulatory Visit: Payer: Self-pay

## 2020-12-29 ENCOUNTER — Encounter (HOSPITAL_COMMUNITY): Payer: Self-pay

## 2020-12-29 DIAGNOSIS — E113599 Type 2 diabetes mellitus with proliferative diabetic retinopathy without macular edema, unspecified eye: Secondary | ICD-10-CM | POA: Insufficient documentation

## 2020-12-29 DIAGNOSIS — I82621 Acute embolism and thrombosis of deep veins of right upper extremity: Secondary | ICD-10-CM | POA: Diagnosis not present

## 2020-12-29 DIAGNOSIS — I809 Phlebitis and thrombophlebitis of unspecified site: Secondary | ICD-10-CM | POA: Insufficient documentation

## 2020-12-29 DIAGNOSIS — Z96611 Presence of right artificial shoulder joint: Secondary | ICD-10-CM | POA: Diagnosis not present

## 2020-12-29 DIAGNOSIS — Z7984 Long term (current) use of oral hypoglycemic drugs: Secondary | ICD-10-CM | POA: Diagnosis not present

## 2020-12-29 DIAGNOSIS — I10 Essential (primary) hypertension: Secondary | ICD-10-CM | POA: Insufficient documentation

## 2020-12-29 DIAGNOSIS — R2231 Localized swelling, mass and lump, right upper limb: Secondary | ICD-10-CM | POA: Diagnosis present

## 2020-12-29 DIAGNOSIS — Z7982 Long term (current) use of aspirin: Secondary | ICD-10-CM | POA: Diagnosis not present

## 2020-12-29 DIAGNOSIS — H35039 Hypertensive retinopathy, unspecified eye: Secondary | ICD-10-CM | POA: Diagnosis not present

## 2020-12-29 DIAGNOSIS — Z79899 Other long term (current) drug therapy: Secondary | ICD-10-CM | POA: Diagnosis not present

## 2020-12-29 LAB — BASIC METABOLIC PANEL
Anion gap: 7 (ref 5–15)
BUN: 15 mg/dL (ref 8–23)
CO2: 26 mmol/L (ref 22–32)
Calcium: 8.7 mg/dL — ABNORMAL LOW (ref 8.9–10.3)
Chloride: 103 mmol/L (ref 98–111)
Creatinine, Ser: 0.97 mg/dL (ref 0.61–1.24)
GFR, Estimated: >60 mL/min (ref >60)
Glucose, Bld: 235 mg/dL — ABNORMAL HIGH (ref 70–99)
Potassium: 4.2 mmol/L (ref 3.5–5.1)
Sodium: 136 mmol/L (ref 135–145)

## 2020-12-29 LAB — CBC WITH DIFFERENTIAL/PLATELET
Abs Immature Granulocytes: 0.06 10*3/uL (ref 0.00–0.07)
Basophils Absolute: 0.1 10*3/uL (ref 0.0–0.1)
Basophils Relative: 1 %
Eosinophils Absolute: 0.2 10*3/uL (ref 0.0–0.5)
Eosinophils Relative: 2 %
HCT: 42.6 % (ref 39.0–52.0)
Hemoglobin: 13.7 g/dL (ref 13.0–17.0)
Immature Granulocytes: 1 %
Lymphocytes Relative: 29 %
Lymphs Abs: 2.4 10*3/uL (ref 0.7–4.0)
MCH: 29.1 pg (ref 26.0–34.0)
MCHC: 32.2 g/dL (ref 30.0–36.0)
MCV: 90.4 fL (ref 80.0–100.0)
Monocytes Absolute: 0.6 10*3/uL (ref 0.1–1.0)
Monocytes Relative: 8 %
Neutro Abs: 4.9 10*3/uL (ref 1.7–7.7)
Neutrophils Relative %: 59 %
Platelets: 322 10*3/uL (ref 150–400)
RBC: 4.71 MIL/uL (ref 4.22–5.81)
RDW: 14.7 % (ref 11.5–15.5)
WBC: 8.2 10*3/uL (ref 4.0–10.5)
nRBC: 0 % (ref 0.0–0.2)

## 2020-12-29 MED ORDER — APIXABAN 5 MG PO TABS
10.0000 mg | ORAL_TABLET | Freq: Once | ORAL | Status: AC
  Administered 2020-12-29: 10 mg via ORAL

## 2020-12-29 MED ORDER — APIXABAN 5 MG PO TABS: ORAL_TABLET | ORAL | 0 refills | Status: DC

## 2020-12-29 NOTE — Discharge Instructions (Signed)
See your Physician for recheck.  Return if any problems.  °

## 2020-12-29 NOTE — ED Triage Notes (Signed)
Pt was discharged from hospital 12/17/20. Pt developed redness and swelling at IV site right lower arm. Pt was placed on antibiotics twice. Pt in today for ultrasound and has blood clot to right upper arm

## 2020-12-29 NOTE — ED Provider Notes (Signed)
[facility] EMERGENCY DEPARTMENT Provider Note   CSN: [id number] Arrival date & time: 12/29/20  1526     History Chief Complaint  Patient presents with  . blood clot    Kurt [name] is a 70 y.o. male.  Pt reports he was in the hospital for cellulitis.  He was discharged on 1/1.  Pt reports he had an IV in his right arm.  Pt reports he now has painful lumps in his right arm.  Pt reports he had an ultrasound that showed her had clot in his arm   The history is provided by the patient. No language interpreter was used.       Past Medical History:  Diagnosis Date  . Arthritis   . Bronchitis   . Bronchitis   . Cataract    OU  . Concussion    late 1990's  after a fall  . GERD (gastroesophageal reflux disease)   . History of kidney stones   . Hypertension   . Hypertensive retinopathy    OU  . Osteogenesis imperfecta   . PONV (postoperative nausea and vomiting)    pt has post polio syndrome  . Post-polio syndrome   . Type 2 diabetes mellitus (HCC) 12/25/2018    Patient Active Problem List   Diagnosis Date Noted  . Cellulitis and abscess of leg 12/15/2020  . Cellulitis of left lower extremity 12/14/2020  . Hypertension   . 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 Cellulitis 02/24/2019  . 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 Osteogenesis imperfecta 12/25/2018  . Type 2 diabetes mellitus (HCC) 12/25/2018    Past Surgical History:  Procedure Laterality Date  . ANKLE FRACTURE SURGERY Bilateral   . arm surgery Right    nerve surgery  . COLONOSCOPY    . ELBOW FRACTURE SURGERY Left   . EYE MUSCLE SURGERY Left   . LEG SURGERY Left    femur fracture with rod  . REVERSE SHOULDER ARTHROPLASTY Right 11/10/2019   Procedure: RIGHT REVERSE SHOULDER ARTHROPLASTY;  Surgeon: Cammy Copa, MD;  Location: [facility] OR;  Service: Orthopedics;  Laterality: Right;  . TONSILLECTOMY         Family History  Problem Relation Age of Onset  . Hypertension Mother   . Diabetes Brother      Social History   Tobacco Use  . Smoking status: Never Smoker  . Smokeless tobacco: Never Used  Vaping Use  . Vaping Use: Never used  Substance Use Topics  . Alcohol use: Yes    Comment: 1 beer occasionally  . Drug use: No    Home Medications Prior to Admission medications   Medication Sig Start Date End Date Taking? Authorizing Provider  acetaminophen (TYLENOL) 650 MG CR tablet Take 1,300 mg by mouth every 8 (eight) hours as needed for pain.    [provider]  aspirin 81 MG chewable tablet Chew 1 tablet (81 mg total) by mouth daily. 11/25/19   Magnant, Charles L, PA-C  glipiZIDE (GLUCOTROL XL) 10 MG 24 hr tablet Take 10 mg by mouth at bedtime.     [provider]  LANTUS SOLOSTAR 100 UNIT/ML Solostar Pen Inject 34 Units into the skin at bedtime. 12/19/18   [provider]  lisinopril (PRINIVIL,ZESTRIL) 20 MG tablet Take 20 mg by mouth daily.    [provider]  methocarbamol (ROBAXIN) 500 MG tablet Take 500 mg by mouth at bedtime as needed for muscle spasms. 07/02/19   [provider]  Koren Bound test  strip 1 each 2 (two) times daily. 10/05/20   [provider]  oxyCODONE (OXY IR/ROXICODONE) 5 MG immediate release tablet Take 1 tablet (5 mg total) by mouth every 8 (eight) hours as needed for moderate pain (pain score 4-6). Patient not taking: Reported on 12/14/2020 11/25/19   Magnant, Joycie Peek, PA-C  simvastatin (ZOCOR) 20 MG tablet Take 20 mg by mouth every evening.    [provider]  SitaGLIPtin-MetFORMIN HCl [phone] MG TB24 Take 1 tablet by mouth at bedtime.    [provider]    Allergies    Augmentin [amoxicillin-pot clavulanate], Iodine, and Tape  Review of Systems   Review of Systems  All other systems reviewed and are negative.   Physical Exam Updated Vital Signs BP 139/81 (BP Location: Right Arm)   Pulse 96   Temp 98.1 F (36.7 C) (Oral)   Resp 18   Ht 5\' 5"  (1.651 m)   Wt 94.3 kg    SpO2 98%   BMI 34.61 kg/m   Physical Exam Vitals and nursing note reviewed.  Constitutional:      Appearance: He is well-developed and well-nourished.  HENT:     Head: Normocephalic and atraumatic.  Eyes:     Conjunctiva/sclera: Conjunctivae normal.  Cardiovascular:     Rate and Rhythm: Normal rate and regular rhythm.     Heart sounds: No murmur heard.   Pulmonary:     Effort: Pulmonary effort is normal. No respiratory distress.     Breath sounds: Normal breath sounds.  Abdominal:     Palpations: Abdomen is soft.     Tenderness: There is no abdominal tenderness.  Musculoskeletal:        General: Swelling and tenderness present. No edema.     Cervical back: Neck supple.     Comments: Tender knots right arm above antecubital area.   Skin:    General: Skin is warm and dry.  Neurological:     Mental Status: He is alert.  Psychiatric:        Mood and Affect: Mood and affect normal.     ED Results / Procedures / Treatments   Labs (all labs ordered are listed, but only abnormal results are displayed) Labs Reviewed - No data to display  EKG None  Radiology Venous Img Upper Uni Right(DVT)  Result Date: 12/29/2020 CLINICAL DATA:  Phlebitis. EXAM: Right UPPER EXTREMITY VENOUS DOPPLER ULTRASOUND TECHNIQUE: Gray-scale sonography with graded compression, as well as color Doppler and duplex ultrasound were performed to evaluate the upper extremity deep venous system from the level of the subclavian vein and including the jugular, axillary, basilic, radial, ulnar and upper cephalic vein. Spectral Doppler was utilized to evaluate flow at rest and with distal augmentation maneuvers. COMPARISON:  None. FINDINGS: Contralateral Subclavian Vein: Respiratory phasicity is normal and symmetric with the symptomatic side. No evidence of thrombus. Normal compressibility. Internal Jugular Vein: No evidence of thrombus. Normal compressibility, respiratory phasicity and response to augmentation.  Subclavian Vein: No evidence of thrombus. Normal compressibility, respiratory phasicity and response to augmentation. Axillary Vein: No evidence of thrombus. Normal compressibility, respiratory phasicity and response to augmentation. Cephalic Vein: Noncompressible with no flow consistent with occlusive thrombus. Basilic Vein: No evidence of thrombus. Brachial Veins: Noncompressible with no flow consistent with occlusive thrombus. Radial Veins: No evidence of thrombus. Normal compressibility, respiratory phasicity and response to augmentation. Ulnar Veins: No evidence of thrombus. Normal compressibility, respiratory phasicity and response to augmentation. Venous Reflux:  None visualized. Other Findings:  None visualized. IMPRESSION: Thrombosis of the left brachial and cephalic veins is noted. This is consistent with deep venous thrombosis. Electronically Signed   By: Lupita Raider M.D.   On: 12/29/2020 15:05    Procedures Procedures (including critical care time)  Medications Ordered in ED Medications - No data to display  ED Course  I have reviewed the triage vital signs and the nursing notes.  Pertinent labs & imaging results that were available during my care of the patient were reviewed by me and considered in my medical decision making (see chart for details).    MDM Rules/Calculators/A&P                          MDM:  Pt has a thrombosis of brachial and cephalic vein of right arm.  Pt's labs reviewed.  Pt stated on eliquis.  Rx for eliquis  Pt advised to follow up with primary care MD  Final Clinical Impression(s) / ED Diagnoses Final diagnoses:  Arm DVT (deep venous thromboembolism), acute, right (HCC)    Rx / DC Orders ED Discharge Orders         Ordered    apixaban (ELIQUIS) 5 MG TABS tablet        12/29/20 1800        An After Visit Summary was printed and given to the patient.    Osie Cheeks 12/29/20 1800    Eber Hong, MD 12/29/20 2123

## 2021-01-03 NOTE — Progress Notes (Shared)
[facility] - Clinic Note  01/04/2021     CHIEF COMPLAINT Patient presents for No chief complaint on file.   HISTORY OF PRESENT ILLNESS: Kurt [name] is a 70 y.o. male who presents to the clinic today for:   Patient states he saw Dr. Karleen Hampshire yesterday who was very pleased with how his eyes look this year, Dr. Karleen Hampshire gave him a new glasses rx, pt wanted to make sure Dr. Vanessa Barbara was okay with him getting new glasses before he got the rx filled  Referring physician:  Altamease Oiler, FNP [address] [place],  [place] [address]  HISTORICAL INFORMATION:   Selected notes from the MEDICAL RECORD NUMBER Diabetic Eval per Dr. Karleen Hampshire   CURRENT MEDICATIONS: No current outpatient medications on file. (Ophthalmic Drugs)   No current facility-administered medications for this visit. (Ophthalmic Drugs)   Current Outpatient Medications (Other)  Medication Sig  . acetaminophen (TYLENOL) 650 MG CR tablet Take 1,300 mg by mouth every 8 (eight) hours as needed for pain.  Marland Kitchen apixaban (ELIQUIS) 5 MG TABS tablet Take 2 tablets (10mg ) twice daily for 7 days, then 1 tablet (5mg ) twice daily  . aspirin 81 MG chewable tablet Chew 1 tablet (81 mg total) by mouth daily.  glipiZIDE (GLUCOTROL XL) 10 MG 24 hr tablet Take 10 mg by mouth at bedtime.   LANTUS SOLOSTAR 100 UNIT/ML Solostar Pen Inject 34 Units into the skin at bedtime.  Marland Kitchen lisinopril (PRINIVIL,ZESTRIL) 20 MG tablet Take 20 mg by mouth daily.  . methocarbamol (ROBAXIN) 500 MG tablet Take 500 mg by mouth at bedtime as needed for muscle spasms.  Marland Kitchen ULTRA test strip 1 each 2 (two) times daily.  Marland Kitchen oxyCODONE (OXY IR/ROXICODONE) 5 MG immediate release tablet Take 1 tablet (5 mg total) by mouth every 8 (eight) hours as needed for moderate pain (pain score 4-6). (Patient not taking: Reported on 12/14/2020)  . simvastatin (ZOCOR) 20 MG tablet Take 20 mg by mouth every evening.  . SitaGLIPtin-MetFORMIN HCl [phone] MG  TB24 Take 1 tablet by mouth at bedtime.   No current facility-administered medications for this visit. (Other)   REVIEW OF SYSTEMS:  ALLERGIES Allergies  Allergen Reactions  . Augmentin [Amoxicillin-Pot Clavulanate] Nausea And Vomiting  . Iodine Hives  . Tape Rash    Paper    PAST MEDICAL HISTORY Past Medical History:  Diagnosis Date  . Arthritis   . Bronchitis   . Bronchitis   . Cataract    OU  . Concussion    late 1990's  after a fall  . GERD (gastroesophageal reflux disease)   . History of kidney stones   . Hypertension   . Hypertensive retinopathy    OU  . Osteogenesis imperfecta   . PONV (postoperative nausea and vomiting)    pt has post polio syndrome  . Post-polio syndrome   . Type 2 diabetes mellitus (HCC) 12/25/2018   Past Surgical History:  Procedure Laterality Date  . ANKLE FRACTURE SURGERY Bilateral   . arm surgery Right    nerve surgery  . COLONOSCOPY    . ELBOW FRACTURE SURGERY Left   . EYE MUSCLE SURGERY Left   . LEG SURGERY Left    femur fracture with rod  . REVERSE SHOULDER ARTHROPLASTY Right 11/10/2019   Procedure: RIGHT REVERSE SHOULDER ARTHROPLASTY;  Surgeon: 02/23/2019, MD;  Location: [facility] OR;  Service: Orthopedics;  Laterality: Right;  . TONSILLECTOMY  FAMILY HISTORY Family History  Problem Relation Age of Onset  . Hypertension Mother   . Diabetes Brother    SOCIAL HISTORY Social History   Tobacco Use  . Smoking status: Never Smoker  . Smokeless tobacco: Never Used  Vaping Use  . Vaping Use: Never used  Substance Use Topics  . Alcohol use: Yes    Comment: 1 beer occasionally  . Drug use: No         OPHTHALMIC EXAM:  Not recorded    IMAGING AND PROCEDURES  Imaging and Procedures for @TODAY @           ASSESSMENT/PLAN:    ICD-10-CM   1. Branch retinal vein occlusion of right eye with macular edema  H34.8310   2. Retinal edema  H35.81   3. Diabetes mellitus type 2 without retinopathy (HCC)  E11.9    4. Essential hypertension  I10   5. Hypertensive retinopathy of both eyes  H35.033   6. Combined forms of age-related cataract of both eyes  H25.813    1,2. BRVO with CME OD  - s/p IVA OD #1 (01.19.21), #2 (02.16.21), #3 (03.16.21), #4 (4.13.21) -- IVA resistance             - s/p IVE OD #1 (05.11.21--sample), #2 (06.08.21), #3 (07.06.21), #4 (8.3.21), #5 (08.31.21), #6 (09.28.21), #7 (10.26.21), #8 (11.23.21), #9 (12.22.21)  - BCVA improved to 20/20-2 OD  - exam with focal edema, IRH, CWS superonasal macula -- improving  - OCT shows mild interval improvement in IRF/edema SN macula -- non-central  - recommend IVE OD #10 today, (01.19.22)  - RBA of procedure discussed, questions answered  - informed consent obtained  - Avastin informed consent form signed and scanned on 01.19.21  - Eylea informed consent form signed and scanned on 05.11.21  - see procedure note  - Eylea4U benefits investigation started, 05.11.21 -- approved through [employer] through 12.31.21  - patient states that he has obtained approval for [facility] 2022  - F/U 4 weeks -- DFE/OCT/possible injection  **clear from retina standpoint to update MRx**  3. Diabetes mellitus, type 2 without retinopathy OU  - The incidence, risk factors for progression, natural history and treatment options for diabetic retinopathy  were discussed with patient.    - The need for close monitoring of blood glucose, blood pressure, and serum lipids, avoiding cigarette or any type of tobacco, and the need for long term follow up was also discussed with patient.  - f/u in 1 year, sooner prn  4,5. Hypertensive retinopathy OU  - discussed importance of tight BP control  - monitor  6. Age related cataracts OU   - The symptoms of cataract, surgical options, and treatments and risks were discussed with patient.  - discussed diagnosis and progression  - not yet visually significant  - monitor for now  Ophthalmic Meds Ordered this visit:  No orders  of the defined types were placed in this encounter.     No follow-ups on file.  There are no Patient Instructions on file for this visit.   Explained the diagnoses, plan, and follow up with the patient and they expressed understanding.  Patient expressed understanding of the importance of proper follow up care.   This document serves as a record of services personally performed by 2023, MD, PhD. It was created on their behalf by Karie Chimera, COMT. The creation of this record is the provider's dictation and/or activities during the visit.  Electronically signed by:  Annalee Genta, COMT 01/03/21 10:50 AM     Karie Chimera, M.D., Ph.D. Diseases & Surgery of the Retina and Vitreous [facility]    Abbreviations: M myopia (nearsighted); A astigmatism; H hyperopia (farsighted); P presbyopia; Mrx spectacle prescription;  CTL contact lenses; OD right eye; OS left eye; OU both eyes  XT exotropia; ET esotropia; PEK punctate epithelial keratitis; PEE punctate epithelial erosions; DES dry eye syndrome; MGD meibomian gland dysfunction; ATs artificial tears; PFAT's preservative free artificial tears; NSC nuclear sclerotic cataract; PSC posterior subcapsular cataract; ERM epi-retinal membrane; PVD posterior vitreous detachment; RD retinal detachment; DM diabetes mellitus; DR diabetic retinopathy; NPDR non-proliferative diabetic retinopathy; PDR proliferative diabetic retinopathy; CSME clinically significant macular edema; DME diabetic macular edema; dbh dot blot hemorrhages; CWS cotton wool spot; POAG primary open angle glaucoma; C/D cup-to-disc ratio; HVF humphrey visual field; GVF goldmann visual field; OCT optical coherence tomography; IOP intraocular pressure; BRVO Branch retinal vein occlusion; CRVO central retinal vein occlusion; CRAO central retinal artery occlusion; BRAO branch retinal artery occlusion; RT retinal tear; SB scleral buckle; PPV pars plana vitrectomy;  VH Vitreous hemorrhage; PRP panretinal laser photocoagulation; IVK intravitreal kenalog; VMT vitreomacular traction; MH Macular hole;  NVD neovascularization of the disc; NVE neovascularization elsewhere; AREDS age related eye disease study; ARMD age related macular degeneration; POAG primary open angle glaucoma; EBMD epithelial/anterior basement membrane dystrophy; ACIOL anterior chamber intraocular lens; IOL intraocular lens; PCIOL posterior chamber intraocular lens; Phaco/IOL phacoemulsification with intraocular lens placement; PRK photorefractive keratectomy; LASIK laser assisted in situ keratomileusis; HTN hypertension; DM diabetes mellitus; COPD chronic obstructive pulmonary disease

## 2021-01-04 ENCOUNTER — Encounter (INDEPENDENT_AMBULATORY_CARE_PROVIDER_SITE_OTHER): Payer: Medicare Other | Admitting: Ophthalmology

## 2021-01-05 NOTE — Progress Notes (Signed)
[facility] - Clinic Note  01/10/2021     CHIEF COMPLAINT Patient presents for Retina Follow Up   HISTORY OF PRESENT ILLNESS: Kurt [name] is a 70 y.o. male who presents to the clinic today for:   HPI    Retina Follow Up    Patient presents with  CRVO/BRVO.  In right eye.  Duration of 5 weeks.  Since onset it is stable.  I, the attending physician,  performed the HPI with the patient and updated documentation appropriately.          Comments    5 week follow up BRVO OD- Vision stable OU.   BS 157 2 hours ago A1C 7.2 or 7.4 Dx blood clot in Right arm since last visit. Cellulitis in left lower leg since last visit.        Last edited by Rennis Chris, MD on 01/10/2021  4:25 PM. (History)    Patient states vision is slowly improving, pt was hospitalized for cellulitis of the left leg since he was here last, he developed a blood clot in his right arm from the IV, he states he is on Eliquis now  Referring physician:  Altamease Oiler, FNP [address] [place],  [place] [address]  HISTORICAL INFORMATION:   Selected notes from the MEDICAL RECORD NUMBER Diabetic Eval per Dr. Karleen Hampshire   CURRENT MEDICATIONS: No current outpatient medications on file. (Ophthalmic Drugs)   No current facility-administered medications for this visit. (Ophthalmic Drugs)   Current Outpatient Medications (Other)  Medication Sig  . acetaminophen (TYLENOL) 650 MG CR tablet Take 1,300 mg by mouth every 8 (eight) hours as needed for pain.  Marland Kitchen apixaban (ELIQUIS) 5 MG TABS tablet Take 2 tablets (10mg ) twice daily for 7 days, then 1 tablet (5mg ) twice daily  . aspirin 81 MG chewable tablet Chew 1 tablet (81 mg total) by mouth daily.  glipiZIDE (GLUCOTROL XL) 10 MG 24 hr tablet Take 10 mg by mouth at bedtime.   LANTUS SOLOSTAR 100 UNIT/ML Solostar Pen Inject 34 Units into the skin at bedtime.  Marland Kitchen lisinopril (PRINIVIL,ZESTRIL) 20 MG tablet Take 20 mg by mouth daily.  .  methocarbamol (ROBAXIN) 500 MG tablet Take 500 mg by mouth at bedtime as needed for muscle spasms.  Marland Kitchen ULTRA test strip 1 each 2 (two) times daily.  . simvastatin (ZOCOR) 20 MG tablet Take 20 mg by mouth every evening.  . SitaGLIPtin-MetFORMIN HCl [phone] MG TB24 Take 1 tablet by mouth at bedtime.  Marland Kitchen oxyCODONE (OXY IR/ROXICODONE) 5 MG immediate release tablet Take 1 tablet (5 mg total) by mouth every 8 (eight) hours as needed for moderate pain (pain score 4-6). (Patient not taking: No sig reported)   No current facility-administered medications for this visit. (Other)   REVIEW OF SYSTEMS: ROS    Positive for: Gastrointestinal, Musculoskeletal, Endocrine, Eyes   Negative for: Constitutional, Neurological, Skin, Genitourinary, HENT, Cardiovascular, Respiratory, Psychiatric, Allergic/Imm, Heme/Lymph   Last edited by Letta Pate, COA on 01/10/2021  2:57 PM. (History)     ALLERGIES Allergies  Allergen Reactions  . Augmentin [Amoxicillin-Pot Clavulanate] Nausea And Vomiting  . Iodine Hives  . Tape Rash    Paper    PAST MEDICAL HISTORY Past Medical History:  Diagnosis Date  . Arthritis   . Bronchitis   . Bronchitis   . Cataract    OU  . Concussion    late 1990's  after a fall  . GERD (  gastroesophageal reflux disease)   . History of kidney stones   . Hypertension   . Hypertensive retinopathy    OU  . Osteogenesis imperfecta   . PONV (postoperative nausea and vomiting)    pt has post polio syndrome  . Post-polio syndrome   . Type 2 diabetes mellitus (HCC) 12/25/2018   Past Surgical History:  Procedure Laterality Date  . ANKLE FRACTURE SURGERY Bilateral   . arm surgery Right    nerve surgery  . COLONOSCOPY    . ELBOW FRACTURE SURGERY Left   . EYE MUSCLE SURGERY Left   . LEG SURGERY Left    femur fracture with rod  . REVERSE SHOULDER ARTHROPLASTY Right 11/10/2019   Procedure: RIGHT REVERSE SHOULDER ARTHROPLASTY;  Surgeon: Cammy Copaean, Gregory Scott, MD;  [facility];   Service: Orthopedics;  Laterality: Right;  . TONSILLECTOMY     FAMILY HISTORY Family History  Problem Relation Age of Onset  . Hypertension Mother   . Diabetes Brother    SOCIAL HISTORY Social History   Tobacco Use  . Smoking status: Never Smoker  . Smokeless tobacco: Never Used  Vaping Use  . Vaping Use: Never used  Substance Use Topics  . Alcohol use: Yes    Comment: 1 beer occasionally  . Drug use: No         OPHTHALMIC EXAM:  Base Eye Exam    Visual Acuity (Snellen - Linear)      Right Left   Dist cc 20/30 20/25   Dist ph cc 20/25 20/20 -2       Tonometry (Tonopen, 3:04 PM)      Right Left   Pressure 10 13       Pupils      Dark Light Shape React APD   Right 3 2 Round Brisk None   Left 3 2 Round Brisk None       Visual Fields (Counting fingers)      Left Right    Full Full       Extraocular Movement      Right Left    Full Full       Neuro/Psych    Oriented x3: Yes   Mood/Affect: Normal       Dilation    Both eyes: 1.0% Mydriacyl, 2.5% Phenylephrine @ 3:04 PM        Slit Lamp and Fundus Exam    Slit Lamp Exam      Right Left   Lids/Lashes Dermatochalasis - upper lid, Dermatochalasis - lower lid, mild Meibomian gland dysfunction Dermatochalasis - upper lid, Dermatochalasis - lower lid, mild Meibomian gland dysfunction   Conjunctiva/Sclera blue sclera blue sclera   Cornea arcus arcus   Anterior Chamber deep and clear deep and clear   Iris round and dilated, no NVI round and dilated, no NVI   Lens 2+ NS, 2+CS 2+ NS, 2+CS   Vitreous mild syneresis, PVD mild syneresis       Fundus Exam      Right Left   Disc pink and sharp pink and sharp   C/D Ratio 0.3 0.3   Macula Good foveal reflex, persistent, focal edema SN macula--improving, IRH, MA and exudate - improving, CWS -- improved flat, good foveal reflex, mild RPE mottling and clumping, +drusen, No heme or edema   Vessels mild attenuation, +A/V crossing changes, focal BRVO superior  macula, mild Tortuousity mild attenuation and tortuosity   Periphery attached, no heme attached, no heme  Refraction    Wearing Rx      Sphere Cylinder Axis Add   Right -2.00 +2.25 111 +2.75   Left -1.50 +1.50 094 2.75         IMAGING AND PROCEDURES  Imaging and Procedures for @TODAY @  OCT, Retina - OU - Both Eyes       Right Eye Quality was good. Central Foveal Thickness: 238. Progression has improved. Findings include no SRF, intraretinal fluid, retinal drusen , normal foveal contour (Mild interval improvement in SN edema/IRF).   Left Eye Quality was good. Central Foveal Thickness: 258. Progression has been stable. Findings include normal foveal contour, no IRF, no SRF, vitreomacular adhesion , retinal drusen .   Notes *Images captured and stored on drive  Diagnosis / Impression:  OD: BRVO with mild interval improvement in SN edema/IRF OS: NFP, no SRF/IRF  Clinical management:  See below  Abbreviations: NFP - Normal foveal profile. CME - cystoid macular edema. PED - pigment epithelial detachment. IRF - intraretinal fluid. SRF - subretinal fluid. EZ - ellipsoid zone. ERM - epiretinal membrane. ORA - outer retinal atrophy. ORT - outer retinal tubulation. SRHM - subretinal hyper-reflective material         Intravitreal Injection, Pharmacologic Agent - OD - Right Eye       Time Out 01/10/2021. 3:41 PM. Confirmed correct patient, procedure, site, and patient consented.   Anesthesia Topical anesthesia was used. Anesthetic medications included Lidocaine 2%, Proparacaine 0.5%.   Procedure Preparation included 5% betadine to ocular surface, eyelid speculum. A (32 g) needle was used.   Injection:  2 mg aflibercept 01/12/2021) SOLN   NDC: Gretta Cool, Lot: [id number], Expiration date: 05/16/2021   Route: Intravitreal, Site: Right Eye, Waste: 0.05 mL  Post-op Post injection exam found visual acuity of at least counting fingers. The patient tolerated the procedure  well. There were no complications. The patient received written and verbal post procedure care education. Post injection medications were not given.                 ASSESSMENT/PLAN:    ICD-10-CM   1. Branch retinal vein occlusion of right eye with macular edema  H34.8310 Intravitreal Injection, Pharmacologic Agent - OD - Right Eye    aflibercept (EYLEA) SOLN 2 mg  2. Retinal edema  H35.81 OCT, Retina - OU - Both Eyes  3. Diabetes mellitus type 2 without retinopathy (HCC)  E11.9   4. Essential hypertension  I10   5. Hypertensive retinopathy of both eyes  H35.033   6. Combined forms of age-related cataract of both eyes  H25.813    1,2. BRVO with CME OD  - s/p IVA OD #1 (01.19.21), #2 (02.16.21), #3 (03.16.21), #4 (4.13.21) -- IVA resistance             - s/p IVE OD #1 (05.11.21--sample), #2 (06.08.21), #3 (07.06.21), #4 (8.3.21), #5 (08.31.21), #6 (09.28.21), #7 (10.26.21), #8 (11.23.21), #9 (12.22.21)  - BCVA 20/25 OD  - exam with focal edema, IRH, CWS superonasal macula -- improving  - OCT shows mild interval improvement in IRF/edema SN macula -- non-central  - recommend IVE OD #10 today, (01.24.22)  - RBA of procedure discussed, questions answered  - informed consent obtained  - Avastin informed consent form signed and scanned on 01.19.21  - Eylea informed consent form signed and scanned on 05.11.21  - see procedure note  - Eylea4U benefits investigation started, 05.11.21 -- approved through [employer] through 12.31.22  - patient states that he  has obtained approval for [facility] 2022  - F/U 4 weeks -- DFE/OCT/possible injection  **clear from retina standpoint to update MRx**  3. Diabetes mellitus, type 2 without retinopathy OU  - The incidence, risk factors for progression, natural history and treatment options for diabetic retinopathy  were discussed with patient.    - The need for close monitoring of blood glucose, blood pressure, and serum lipids, avoiding cigarette or any  type of tobacco, and the need for long term follow up was also discussed with patient.  - f/u in 1 year, sooner prn  4,5. Hypertensive retinopathy OU  - discussed importance of tight BP control  - monitor  6. Age related cataracts OU   - The symptoms of cataract, surgical options, and treatments and risks were discussed with patient.  - discussed diagnosis and progression  - not yet visually significant  - monitor for now  Ophthalmic Meds Ordered this visit:  Meds ordered this encounter  Medications  . aflibercept (EYLEA) SOLN 2 mg      Return in about 4 weeks (around 02/07/2021) for f/u BRVO OD, DFE, OCT.  There are no Patient Instructions on file for this visit.  This document serves as a record of services personally performed by Karie Chimera, MD, PhD. It was created on their behalf by Herby Abraham, COA, an ophthalmic technician. The creation of this record is the provider's dictation and/or activities during the visit.    Electronically signed by: Herby Abraham, COA @TODAY @ 4:32 PM  , M.D., Ph.D. Diseases & Surgery of the Retina and Vitreous [facility] 01/10/2021   I have reviewed the above documentation for accuracy and completeness, and I agree with the above. 01/12/2021, M.D., Ph.D. 01/10/21 4:32 PM   Abbreviations: M myopia (nearsighted); A astigmatism; H hyperopia (farsighted); P presbyopia; Mrx spectacle prescription;  CTL contact lenses; OD right eye; OS left eye; OU both eyes  XT exotropia; ET esotropia; PEK punctate epithelial keratitis; PEE punctate epithelial erosions; DES dry eye syndrome; MGD meibomian gland dysfunction; ATs artificial tears; PFAT's preservative free artificial tears; NSC nuclear sclerotic cataract; PSC posterior subcapsular cataract; ERM epi-retinal membrane; PVD posterior vitreous detachment; RD retinal detachment; DM diabetes mellitus; DR diabetic retinopathy; NPDR non-proliferative diabetic  retinopathy; PDR proliferative diabetic retinopathy; CSME clinically significant macular edema; DME diabetic macular edema; dbh dot blot hemorrhages; CWS cotton wool spot; POAG primary open angle glaucoma; C/D cup-to-disc ratio; HVF humphrey visual field; GVF goldmann visual field; OCT optical coherence tomography; IOP intraocular pressure; BRVO Branch retinal vein occlusion; CRVO central retinal vein occlusion; CRAO central retinal artery occlusion; BRAO branch retinal artery occlusion; RT retinal tear; SB scleral buckle; PPV pars plana vitrectomy; VH Vitreous hemorrhage; PRP panretinal laser photocoagulation; IVK intravitreal kenalog; VMT vitreomacular traction; MH Macular hole;  NVD neovascularization of the disc; NVE neovascularization elsewhere; AREDS age related eye disease study; ARMD age related macular degeneration; POAG primary open angle glaucoma; EBMD epithelial/anterior basement membrane dystrophy; ACIOL anterior chamber intraocular lens; IOL intraocular lens; PCIOL posterior chamber intraocular lens; Phaco/IOL phacoemulsification with intraocular lens placement; PRK photorefractive keratectomy; LASIK laser assisted in situ keratomileusis; HTN hypertension; DM diabetes mellitus; COPD chronic obstructive pulmonary disease

## 2021-01-10 ENCOUNTER — Other Ambulatory Visit: Payer: Self-pay

## 2021-01-10 ENCOUNTER — Encounter (INDEPENDENT_AMBULATORY_CARE_PROVIDER_SITE_OTHER): Payer: Self-pay | Admitting: Ophthalmology

## 2021-01-10 ENCOUNTER — Ambulatory Visit (INDEPENDENT_AMBULATORY_CARE_PROVIDER_SITE_OTHER): Payer: Medicare Other | Admitting: Ophthalmology

## 2021-01-10 DIAGNOSIS — I10 Essential (primary) hypertension: Secondary | ICD-10-CM | POA: Diagnosis not present

## 2021-01-10 DIAGNOSIS — H25813 Combined forms of age-related cataract, bilateral: Secondary | ICD-10-CM

## 2021-01-10 DIAGNOSIS — H35033 Hypertensive retinopathy, bilateral: Secondary | ICD-10-CM

## 2021-01-10 DIAGNOSIS — H3581 Retinal edema: Secondary | ICD-10-CM

## 2021-01-10 DIAGNOSIS — E119 Type 2 diabetes mellitus without complications: Secondary | ICD-10-CM

## 2021-01-10 DIAGNOSIS — H34831 Tributary (branch) retinal vein occlusion, right eye, with macular edema: Secondary | ICD-10-CM | POA: Diagnosis not present

## 2021-01-10 MED ORDER — AFLIBERCEPT 2MG/0.05ML IZ SOLN FOR KALEIDOSCOPE
2.0000 mg | INTRAVITREAL | Status: AC | PRN
  Administered 2021-01-10: 2 mg via INTRAVITREAL

## 2021-02-03 NOTE — Progress Notes (Signed)
[facility] - Clinic Note  02/07/2021     CHIEF COMPLAINT Patient presents for Retina Follow Up   HISTORY OF PRESENT ILLNESS: Kurt [name] is a 70 y.o. male who presents to the clinic today for:  HPI    Retina Follow Up    Patient presents with  CRVO/BRVO.  In right eye.  This started 4 weeks ago.  I, the attending physician,  performed the HPI with the patient and updated documentation appropriately.          Comments    Patient here for 4 weeks retina follow up for  BRVO OD. Patient states vision doing so so. Has an irritation like a scratch.        Last edited by Rennis Chris, MD on 02/07/2021  4:07 PM. (History)    Pt states no change in vision  Referring physician:  Altamease Oiler, FNP [address] [place],  [place] [address]  HISTORICAL INFORMATION:   Selected notes from the MEDICAL RECORD NUMBER Diabetic Eval per Dr. Karleen Hampshire   CURRENT MEDICATIONS: No current outpatient medications on file. (Ophthalmic Drugs)   No current facility-administered medications for this visit. (Ophthalmic Drugs)   Current Outpatient Medications (Other)  Medication Sig  . acetaminophen (TYLENOL) 650 MG CR tablet Take 1,300 mg by mouth every 8 (eight) hours as needed for pain.  Marland Kitchen apixaban (ELIQUIS) 5 MG TABS tablet Take 2 tablets (10mg ) twice daily for 7 days, then 1 tablet (5mg ) twice daily  . aspirin 81 MG chewable tablet Chew 1 tablet (81 mg total) by mouth daily.  glipiZIDE (GLUCOTROL XL) 10 MG 24 hr tablet Take 10 mg by mouth at bedtime.   LANTUS SOLOSTAR 100 UNIT/ML Solostar Pen Inject 34 Units into the skin at bedtime.  Marland Kitchen lisinopril (PRINIVIL,ZESTRIL) 20 MG tablet Take 20 mg by mouth daily.  . methocarbamol (ROBAXIN) 500 MG tablet Take 500 mg by mouth at bedtime as needed for muscle spasms.  Marland Kitchen ULTRA test strip 1 each 2 (two) times daily.  Marland Kitchen oxyCODONE (OXY IR/ROXICODONE) 5 MG immediate release tablet Take 1 tablet (5 mg total) by mouth  every 8 (eight) hours as needed for moderate pain (pain score 4-6). (Patient not taking: No sig reported)  . simvastatin (ZOCOR) 20 MG tablet Take 20 mg by mouth every evening.  . SitaGLIPtin-MetFORMIN HCl 952 017 0994 MG TB24 Take 1 tablet by mouth at bedtime.   No current facility-administered medications for this visit. (Other)   REVIEW OF SYSTEMS: ROS    Positive for: Gastrointestinal, Musculoskeletal, Endocrine, Eyes   Negative for: Constitutional, Neurological, Skin, Genitourinary, HENT, Cardiovascular, Respiratory, Psychiatric, Allergic/Imm, Heme/Lymph   Last edited by Letta Pate, COA on 02/07/2021  2:38 PM. (History)     ALLERGIES Allergies  Allergen Reactions  . Augmentin [Amoxicillin-Pot Clavulanate] Nausea And Vomiting  . Iodine Hives  . Tape Rash    Paper    PAST MEDICAL HISTORY Past Medical History:  Diagnosis Date  . Arthritis   . Bronchitis   . Bronchitis   . Cataract    OU  . Concussion    late 1990's  after a fall  . GERD (gastroesophageal reflux disease)   . History of kidney stones   . Hypertension   . Hypertensive retinopathy    OU  . Osteogenesis imperfecta   . PONV (postoperative nausea and vomiting)    pt has post polio syndrome  . Post-polio syndrome   . Type  2 diabetes mellitus (HCC) 12/25/2018   Past Surgical History:  Procedure Laterality Date  . ANKLE FRACTURE SURGERY Bilateral   . arm surgery Right    nerve surgery  . COLONOSCOPY    . ELBOW FRACTURE SURGERY Left   . EYE MUSCLE SURGERY Left   . LEG SURGERY Left    femur fracture with rod  . REVERSE SHOULDER ARTHROPLASTY Right 11/10/2019   Procedure: RIGHT REVERSE SHOULDER ARTHROPLASTY;  Surgeon: Cammy Copa, MD;  Location: [facility] OR;  Service: Orthopedics;  Laterality: Right;  . TONSILLECTOMY     FAMILY HISTORY Family History  Problem Relation Age of Onset  . Hypertension Mother   . Diabetes Brother    SOCIAL HISTORY Social History   Tobacco Use  . Smoking status: Never  Smoker  . Smokeless tobacco: Never Used  Vaping Use  . Vaping Use: Never used  Substance Use Topics  . Alcohol use: Yes    Comment: 1 beer occasionally  . Drug use: No         OPHTHALMIC EXAM:  Base Eye Exam    Visual Acuity (Snellen - Linear)      Right Left   Dist cc 20/30 +1 20/20 -1   Dist ph cc 20/20 -1    Correction: Glasses       Tonometry (Tonopen, 2:36 PM)      Right Left   Pressure 12 13       Pupils      Dark Light Shape React APD   Right 3 2 Round Brisk None   Left 3 2 Round Brisk None       Visual Fields (Counting fingers)      Left Right    Full Full       Extraocular Movement      Right Left    Full Full       Neuro/Psych    Oriented x3: Yes   Mood/Affect: Normal       Dilation    Both eyes: 1.0% Mydriacyl, 2.5% Phenylephrine @ 2:36 PM        Slit Lamp and Fundus Exam    Slit Lamp Exam      Right Left   Lids/Lashes Dermatochalasis - upper lid, mild Meibomian gland dysfunction Dermatochalasis - upper lid, Dermatochalasis - lower lid, mild Meibomian gland dysfunction   Conjunctiva/Sclera blue sclera blue sclera   Cornea arcus arcus   Anterior Chamber deep and clear deep and clear   Iris round and dilated, no NVI round and dilated, no NVI   Lens 2+ NS, 2+CS 2+ NS, 2+CS   Vitreous mild syneresis, PVD mild syneresis       Fundus Exam      Right Left   Disc pink and sharp pink and sharp   C/D Ratio 0.5 0.4   Macula Good foveal reflex, persistent, focal edema SN macula -- improving, focal exudate superior macula - improving flat, good foveal reflex, mild RPE mottling and clumping, +drusen, No heme or edema   Vessels attenuated, mild tortuousity mild attenuation and tortuosity   Periphery attached, no heme attached, no heme        Refraction    Wearing Rx      Sphere Cylinder Axis Add   Right -2.00 +2.25 111 +2.75   Left -1.50 +1.50 094 2.75         IMAGING AND PROCEDURES  Imaging and Procedures for @TODAY @  OCT, Retina -  OU - Both Eyes  Right Eye Quality was good. Central Foveal Thickness: 237. Progression has improved. Findings include no SRF, intraretinal fluid, retinal drusen , normal foveal contour (Mild interval improvement in SN IRF).   Left Eye Quality was good. Central Foveal Thickness: 253. Progression has been stable. Findings include normal foveal contour, no IRF, no SRF, vitreomacular adhesion , retinal drusen .   Notes *Images captured and stored on drive  Diagnosis / Impression:  OD: BRVO with mild interval improvement in SN IRF OS: NFP, no SRF/IRF  Clinical management:  See below  Abbreviations: NFP - Normal foveal profile. CME - cystoid macular edema. PED - pigment epithelial detachment. IRF - intraretinal fluid. SRF - subretinal fluid. EZ - ellipsoid zone. ERM - epiretinal membrane. ORA - outer retinal atrophy. ORT - outer retinal tubulation. SRHM - subretinal hyper-reflective material         Intravitreal Injection, Pharmacologic Agent - OD - Right Eye       Time Out 02/07/2021. 3:07 PM. Confirmed correct patient, procedure, site, and patient consented.   Anesthesia Topical anesthesia was used. Anesthetic medications included Lidocaine 2%, Proparacaine 0.5%.   Procedure Preparation included 5% betadine to ocular surface, eyelid speculum. A (32 g) needle was used.   Injection:  2 mg aflibercept Gretta Cool) SOLN   NDC: L6038910, Lot: [id number], Expiration date: 05/16/2021   Route: Intravitreal, Site: Right Eye, Waste: 0.05 mL  Post-op Post injection exam found visual acuity of at least counting fingers. The patient tolerated the procedure well. There were no complications. The patient received written and verbal post procedure care education. Post injection medications were not given.                 ASSESSMENT/PLAN:    ICD-10-CM   1. Branch retinal vein occlusion of right eye with macular edema  H34.8310 Intravitreal Injection, Pharmacologic Agent - OD -  Right Eye    aflibercept (EYLEA) SOLN 2 mg  2. Retinal edema  H35.81 OCT, Retina - OU - Both Eyes  3. Diabetes mellitus type 2 without retinopathy (HCC)  E11.9   4. Essential hypertension  I10   5. Hypertensive retinopathy of both eyes  H35.033   6. Combined forms of age-related cataract of both eyes  H25.813    1,2. BRVO with CME OD  - s/p IVA OD #1 (01.19.21), #2 (02.16.21), #3 (03.16.21), #4 (4.13.21) -- IVA resistance             - s/p IVE OD #1 (05.11.21--sample), #2 (06.08.21), #3 (07.06.21), #4 (8.3.21), #5 (08.31.21), #6 (09.28.21), #7 (10.26.21), #8 (11.23.21), #9 (12.22.21), #10 (1.24.22)  - BCVA 20/20 OD  - exam with focal edema, IRH, CWS superonasal macula -- improving  - OCT shows mild interval improvement in IRF SN macula -- non-central  - recommend IVE OD #11 today, 02.22.22  - RBA of procedure discussed, questions answered  - informed consent obtained  - Avastin informed consent form signed and scanned on 01.19.21  - Eylea informed consent form signed and scanned on 05.11.21  - see procedure note  - Eylea4U benefits investigation started, 05.11.21 -- approved through [employer] through 12.31.22  - patient states that he has obtained approval for [facility] 2022  - F/U 4 weeks -- DFE/OCT/possible injection  **clear from retina standpoint to update MRx**  3. Diabetes mellitus, type 2 without retinopathy OU  - The incidence, risk factors for progression, natural history and treatment options for diabetic retinopathy  were discussed with patient.    - The need  for close monitoring of blood glucose, blood pressure, and serum lipids, avoiding cigarette or any type of tobacco, and the need for long term follow up was also discussed with patient.  - f/u in 1 year, sooner prn  4,5. Hypertensive retinopathy OU  - discussed importance of tight BP control  - monitor  6. Age related cataracts OU   - The symptoms of cataract, surgical options, and treatments and risks were discussed  with patient.  - discussed diagnosis and progression  - not yet visually significant  - monitor for now  Ophthalmic Meds Ordered this visit:  Meds ordered this encounter  Medications  . aflibercept (EYLEA) SOLN 2 mg      Return in about 4 weeks (around 03/07/2021) for f/u BRVO OD, DFE, OCT.  There are no Patient Instructions on file for this visit.  This document serves as a record of services personally performed by Karie ChimeraBrian G. Keymon Mcelroy, MD, PhD. It was created on their behalf by Cristopher EstimableAndrew Baxley, COT an ophthalmic technician. The creation of this record is the provider's dictation and/or activities during the visit.    Electronically signed by: Cristopher Estimablendrew Baxley, COT 2.18.22 @ 4:10 PM   This document serves as a record of services personally performed by Karie ChimeraBrian G. Charda Janis, MD, PhD. It was created on their behalf by Glee ArvinAmanda J. Manson PasseyBrown, OA an ophthalmic technician. The creation of this record is the provider's dictation and/or activities during the visit.    Electronically signed by: Glee ArvinAmanda J. Manson PasseyBrown, [place] 02.22.2022 4:10 PM  Karie ChimeraBrian G. Dawon Troop, M.D., Ph.D. Diseases & Surgery of the Retina and Vitreous [facility] 02/07/2021   I have reviewed the above documentation for accuracy and completeness, and I agree with the above. Karie ChimeraBrian G. Jamian Andujo, M.D., Ph.D. 02/07/21 4:10 PM   Abbreviations: M myopia (nearsighted); A astigmatism; H hyperopia (farsighted); P presbyopia; Mrx spectacle prescription;  CTL contact lenses; OD right eye; OS left eye; OU both eyes  XT exotropia; ET esotropia; PEK punctate epithelial keratitis; PEE punctate epithelial erosions; DES dry eye syndrome; MGD meibomian gland dysfunction; ATs artificial tears; PFAT's preservative free artificial tears; NSC nuclear sclerotic cataract; PSC posterior subcapsular cataract; ERM epi-retinal membrane; PVD posterior vitreous detachment; RD retinal detachment; DM diabetes mellitus; DR diabetic retinopathy; NPDR non-proliferative  diabetic retinopathy; PDR proliferative diabetic retinopathy; CSME clinically significant macular edema; DME diabetic macular edema; dbh dot blot hemorrhages; CWS cotton wool spot; POAG primary open angle glaucoma; C/D cup-to-disc ratio; HVF humphrey visual field; GVF goldmann visual field; OCT optical coherence tomography; IOP intraocular pressure; BRVO Branch retinal vein occlusion; CRVO central retinal vein occlusion; CRAO central retinal artery occlusion; BRAO branch retinal artery occlusion; RT retinal tear; SB scleral buckle; PPV pars plana vitrectomy; VH Vitreous hemorrhage; PRP panretinal laser photocoagulation; IVK intravitreal kenalog; VMT vitreomacular traction; MH Macular hole;  NVD neovascularization of the disc; NVE neovascularization elsewhere; AREDS age related eye disease study; ARMD age related macular degeneration; POAG primary open angle glaucoma; EBMD epithelial/anterior basement membrane dystrophy; ACIOL anterior chamber intraocular lens; IOL intraocular lens; PCIOL posterior chamber intraocular lens; Phaco/IOL phacoemulsification with intraocular lens placement; PRK photorefractive keratectomy; LASIK laser assisted in situ keratomileusis; HTN hypertension; DM diabetes mellitus; COPD chronic obstructive pulmonary disease

## 2021-02-07 ENCOUNTER — Ambulatory Visit (INDEPENDENT_AMBULATORY_CARE_PROVIDER_SITE_OTHER): Payer: Medicare Other | Admitting: Ophthalmology

## 2021-02-07 ENCOUNTER — Encounter (INDEPENDENT_AMBULATORY_CARE_PROVIDER_SITE_OTHER): Payer: Self-pay | Admitting: Ophthalmology

## 2021-02-07 ENCOUNTER — Other Ambulatory Visit: Payer: Self-pay

## 2021-02-07 DIAGNOSIS — I10 Essential (primary) hypertension: Secondary | ICD-10-CM

## 2021-02-07 DIAGNOSIS — H35033 Hypertensive retinopathy, bilateral: Secondary | ICD-10-CM

## 2021-02-07 DIAGNOSIS — H34831 Tributary (branch) retinal vein occlusion, right eye, with macular edema: Secondary | ICD-10-CM

## 2021-02-07 DIAGNOSIS — H3581 Retinal edema: Secondary | ICD-10-CM | POA: Diagnosis not present

## 2021-02-07 DIAGNOSIS — H25813 Combined forms of age-related cataract, bilateral: Secondary | ICD-10-CM

## 2021-02-07 DIAGNOSIS — E119 Type 2 diabetes mellitus without complications: Secondary | ICD-10-CM

## 2021-02-07 MED ORDER — AFLIBERCEPT 2MG/0.05ML IZ SOLN FOR KALEIDOSCOPE
2.0000 mg | INTRAVITREAL | Status: AC | PRN
Start: 2021-02-07 — End: 2021-02-07
  Administered 2021-02-07: 2 mg via INTRAVITREAL

## 2021-02-13 ENCOUNTER — Ambulatory Visit (INDEPENDENT_AMBULATORY_CARE_PROVIDER_SITE_OTHER): Payer: Medicare Other | Admitting: Orthopedic Surgery

## 2021-02-13 ENCOUNTER — Ambulatory Visit: Payer: Self-pay

## 2021-02-13 DIAGNOSIS — Z96611 Presence of right artificial shoulder joint: Secondary | ICD-10-CM | POA: Diagnosis not present

## 2021-02-15 ENCOUNTER — Encounter: Payer: Self-pay | Admitting: Orthopedic Surgery

## 2021-02-15 NOTE — Progress Notes (Signed)
Office Visit Note   Patient: Kurt [name]           Date of Birth: 06/17/1951           MRN: [id number] Visit Date: 02/13/2021 Requested by: Altamease Oiler, FNP [address] [place],  [place] [address] PCP: Altamease Oiler, FNP  Subjective: Chief Complaint  Patient presents with  . Right Shoulder - Follow-up    HPI: Kurt [name] is a patient who is now a year out from right reverse shoulder replacement.  He is doing well.  Has sporadic pain but follows the 15 pound lifting restriction.  Has functional use of the arm.              ROS: All systems reviewed are negative as they relate to the chief complaint within the history of present illness.  Patient denies  fevers or chills.   Assessment & Plan: Visit Diagnoses:  1. Status post reverse total replacement of right shoulder     Plan: Impression is well-functioning reverse shoulder replacement in a patient with osteogenesis imperfecta.  Plan is continue to work on activities of daily living with no lifting more than 15 pounds.  Overall he is happy with his surgical result follow-up as needed  Follow-Up Instructions: Return if symptoms worsen or fail to improve.   Orders:  Orders Placed This Encounter  Procedures  . XR Shoulder Right   No orders of the defined types were placed in this encounter.     Procedures: No procedures performed   Clinical Data: No additional findings.  Objective: Vital Signs: There were no vitals taken for this visit.  Physical Exam:   Constitutional: Patient appears well-developed HEENT:  Head: Normocephalic Eyes:EOM are normal Neck: Normal range of motion Cardiovascular: Normal rate Pulmonary/chest: Effort normal Neurologic: Patient is alert Skin: Skin is warm Psychiatric: Patient has normal mood and affect    Ortho Exam: Ortho exam demonstrates palpable radial pulse.  Forward flexion is about 140.  Isolated glenohumeral abduction is about 85.  External rotation is to 45.   Has very good deltoid strength.  Incision intact.  Specialty Comments:  No specialty comments available.  Imaging: No results found.   PMFS History: Patient Active Problem List   Diagnosis Date Noted  . Cellulitis and abscess of leg 12/15/2020  . Cellulitis of left lower extremity 12/14/2020  . Hypertension   . 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 Cellulitis 02/24/2019  . 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 Osteogenesis imperfecta 12/25/2018  . Type 2 diabetes mellitus (HCC) 12/25/2018   Past Medical History:  Diagnosis Date  . Arthritis   . Bronchitis   . Bronchitis   . Cataract    OU  . Concussion    late 1990's  after a fall  . GERD (gastroesophageal reflux disease)   . History of kidney stones   . Hypertension   . Hypertensive retinopathy    OU  . Osteogenesis imperfecta   . PONV (postoperative nausea and vomiting)    pt has post polio syndrome  . Post-polio syndrome   . Type 2 diabetes mellitus (HCC) 12/25/2018    Family History  Problem Relation Age of Onset  . Hypertension Mother   . Diabetes Brother     Past Surgical History:  Procedure Laterality Date  . ANKLE FRACTURE SURGERY Bilateral   . arm surgery Right    nerve surgery  . COLONOSCOPY    . ELBOW FRACTURE SURGERY Left   .  EYE MUSCLE SURGERY Left   . LEG SURGERY Left    femur fracture with rod  . REVERSE SHOULDER ARTHROPLASTY Right 11/10/2019   Procedure: RIGHT REVERSE SHOULDER ARTHROPLASTY;  Surgeon: Cammy Copa, MD;  Location: [facility] OR;  Service: Orthopedics;  Laterality: Right;  . TONSILLECTOMY     Social History   Occupational History  . Not on file  Tobacco Use  . Smoking status: Never Smoker  . Smokeless tobacco: Never Used  Vaping Use  . Vaping Use: Never used  Substance and Sexual Activity  . Alcohol use: Yes    Comment: 1 beer occasionally  . Drug use: No  . Sexual activity: Not Currently

## 2021-03-06 NOTE — Progress Notes (Signed)
[facility] - Clinic Note  03/07/2021     CHIEF COMPLAINT Patient presents for Retina Follow Up   HISTORY OF PRESENT ILLNESS: Kurt [name] is a 70 y.o. male who presents to the clinic today for:  HPI    Retina Follow Up    Patient presents with  CRVO/BRVO.  In right eye.  This started 4 weeks ago.  I, the attending physician,  performed the HPI with the patient and updated documentation appropriately.          Comments    Patient here for 4 weeks retina follow up for BRVO OD. Patient states vision doing alright. A little blurry today. No eye pain.       Last edited by Rennis Chris, MD on 03/07/2021  9:38 PM. (History)      Referring physician:  Altamease Oiler, FNP [address] [place],  [place] [address]  HISTORICAL INFORMATION:   Selected notes from the MEDICAL RECORD NUMBER Diabetic Eval per Dr. Karleen Hampshire   CURRENT MEDICATIONS: No current outpatient medications on file. (Ophthalmic Drugs)   No current facility-administered medications for this visit. (Ophthalmic Drugs)   Current Outpatient Medications (Other)  Medication Sig  . acetaminophen (TYLENOL) 650 MG CR tablet Take 1,300 mg by mouth every 8 (eight) hours as needed for pain.  Marland Kitchen apixaban (ELIQUIS) 5 MG TABS tablet Take 2 tablets (10mg ) twice daily for 7 days, then 1 tablet (5mg ) twice daily  . aspirin 81 MG chewable tablet Chew 1 tablet (81 mg total) by mouth daily.  glipiZIDE (GLUCOTROL XL) 10 MG 24 hr tablet Take 10 mg by mouth at bedtime.   LANTUS SOLOSTAR 100 UNIT/ML Solostar Pen Inject 34 Units into the skin at bedtime.  Marland Kitchen lisinopril (PRINIVIL,ZESTRIL) 20 MG tablet Take 20 mg by mouth daily.  . methocarbamol (ROBAXIN) 500 MG tablet Take 500 mg by mouth at bedtime as needed for muscle spasms.  Marland Kitchen ULTRA test strip 1 each 2 (two) times daily.  Marland Kitchen oxyCODONE (OXY IR/ROXICODONE) 5 MG immediate release tablet Take 1 tablet (5 mg total) by mouth every 8 (eight) hours as needed  for moderate pain (pain score 4-6). (Patient not taking: No sig reported)  . simvastatin (ZOCOR) 20 MG tablet Take 20 mg by mouth every evening.  . SitaGLIPtin-MetFORMIN HCl [phone] MG TB24 Take 1 tablet by mouth at bedtime.   No current facility-administered medications for this visit. (Other)   REVIEW OF SYSTEMS: ROS    Positive for: Gastrointestinal, Musculoskeletal, Endocrine, Eyes   Negative for: Constitutional, Neurological, Skin, Genitourinary, HENT, Cardiovascular, Respiratory, Psychiatric, Allergic/Imm, Heme/Lymph   Last edited by Letta Pate, COA on 03/07/2021  2:56 PM. (History)     ALLERGIES Allergies  Allergen Reactions  . Augmentin [Amoxicillin-Pot Clavulanate] Nausea And Vomiting  . Iodine Hives  . Tape Rash    Paper    PAST MEDICAL HISTORY Past Medical History:  Diagnosis Date  . Arthritis   . Bronchitis   . Bronchitis   . Cataract    OU  . Concussion    late 1990's  after a fall  . GERD (gastroesophageal reflux disease)   . History of kidney stones   . Hypertension   . Hypertensive retinopathy    OU  . Osteogenesis imperfecta   . PONV (postoperative nausea and vomiting)    pt has post polio syndrome  . Post-polio syndrome   . Type 2 diabetes mellitus (HCC) 12/25/2018  Past Surgical History:  Procedure Laterality Date  . ANKLE FRACTURE SURGERY Bilateral   . arm surgery Right    nerve surgery  . COLONOSCOPY    . ELBOW FRACTURE SURGERY Left   . EYE MUSCLE SURGERY Left   . LEG SURGERY Left    femur fracture with rod  . REVERSE SHOULDER ARTHROPLASTY Right 11/10/2019   Procedure: RIGHT REVERSE SHOULDER ARTHROPLASTY;  Surgeon: Cammy Copaean, Gregory Scott, MD;  Location: [facility] OR;  Service: Orthopedics;  Laterality: Right;  . TONSILLECTOMY     FAMILY HISTORY Family History  Problem Relation Age of Onset  . Hypertension Mother   . Diabetes Brother    SOCIAL HISTORY Social History   Tobacco Use  . Smoking status: Never Smoker  . Smokeless tobacco:  Never Used  Vaping Use  . Vaping Use: Never used  Substance Use Topics  . Alcohol use: Yes    Comment: 1 beer occasionally  . Drug use: No         OPHTHALMIC EXAM:  Base Eye Exam    Visual Acuity (Snellen - Linear)      Right Left   Dist cc 20/30 -2 20/25   Dist ph cc 20/25 +1 20/20   Correction: Glasses       Tonometry (Tonopen, 2:53 PM)      Right Left   Pressure 06 12       Pupils      Dark Light Shape React APD   Right 3 2 Round Brisk None   Left 3 2 Round Brisk None       Visual Fields (Counting fingers)      Left Right    Full Full       Extraocular Movement      Right Left    Full Full       Neuro/Psych    Oriented x3: Yes   Mood/Affect: Normal       Dilation    Both eyes: 1.0% Mydriacyl, 2.5% Phenylephrine @ 2:53 PM        Slit Lamp and Fundus Exam    Slit Lamp Exam      Right Left   Lids/Lashes Dermatochalasis - upper lid, mild Meibomian gland dysfunction Dermatochalasis - upper lid, Dermatochalasis - lower lid, mild Meibomian gland dysfunction   Conjunctiva/Sclera blue sclera blue sclera   Cornea arcus arcus   Anterior Chamber deep and clear deep and clear   Iris round and dilated, no NVI round and dilated, no NVI   Lens 2+ NS, 2+CS 2+ NS, 2+CS   Vitreous mild syneresis, PVD mild syneresis       Fundus Exam      Right Left   Disc pink and sharp pink and sharp   C/D Ratio 0.5 0.4   Macula Good foveal reflex; persistent, focal edema SN macula -- improving, focal exudate superior macula - improving, almost resolved flat, good foveal reflex, mild RPE mottling and clumping, +drusen, No heme or edema   Vessels attenuated, mild tortuousity mild attenuation and tortuosity   Periphery attached, no heme attached, no heme        Refraction    Wearing Rx      Sphere Cylinder Axis Add   Right -2.00 +2.25 111 +2.75   Left -1.50 +1.50 094 2.75         IMAGING AND PROCEDURES  Imaging and Procedures for @TODAY @  OCT, Retina - OU - Both  Eyes       Right Eye  Quality was good. Central Foveal Thickness: 242. Progression has improved. Findings include no SRF, intraretinal fluid, retinal drusen , normal foveal contour (Mild interval improvement in SN IRF).   Left Eye Quality was good. Central Foveal Thickness: 256. Progression has been stable. Findings include normal foveal contour, no IRF, no SRF, vitreomacular adhesion , retinal drusen  (Focal PED/druse temporal fovea).   Notes *Images captured and stored on drive  Diagnosis / Impression:  OD: BRVO with mild interval improvement in SN IRF OS: NFP, no SRF/IRF  Clinical management:  See below  Abbreviations: NFP - Normal foveal profile. CME - cystoid macular edema. PED - pigment epithelial detachment. IRF - intraretinal fluid. SRF - subretinal fluid. EZ - ellipsoid zone. ERM - epiretinal membrane. ORA - outer retinal atrophy. ORT - outer retinal tubulation. SRHM - subretinal hyper-reflective material         Intravitreal Injection, Pharmacologic Agent - OD - Right Eye       Time Out 03/07/2021. 3:47 PM. Confirmed correct patient, procedure, site, and patient consented.   Anesthesia Topical anesthesia was used. Anesthetic medications included Lidocaine 2%, Proparacaine 0.5%.   Procedure Preparation included 5% betadine to ocular surface, eyelid speculum.   Injection:  2 mg aflibercept Gretta Cool) SOLN   NDC: L6038910, Lot: [id number], Expiration date: 08/17/2021   Route: Intravitreal, Site: Right Eye, Waste: 0.05 mL  Post-op Post injection exam found visual acuity of at least counting fingers. The patient tolerated the procedure well. There were no complications. The patient received written and verbal post procedure care education. Post injection medications were not given.                 ASSESSMENT/PLAN:    ICD-10-CM   1. Branch retinal vein occlusion of right eye with macular edema  H34.8310 Intravitreal Injection, Pharmacologic Agent - OD -  Right Eye    aflibercept (EYLEA) SOLN 2 mg  2. Retinal edema  H35.81 OCT, Retina - OU - Both Eyes  3. Diabetes mellitus type 2 without retinopathy (HCC)  E11.9   4. Essential hypertension  I10   5. Hypertensive retinopathy of both eyes  H35.033   6. Combined forms of age-related cataract of both eyes  H25.813    1,2. BRVO with CME OD  - s/p IVA OD #1 (01.19.21), #2 (02.16.21), #3 (03.16.21), #4 (4.13.21) -- IVA resistance             - s/p IVE OD #1 (05.11.21--sample), #2 (06.08.21), #3 (07.06.21), #4 (8.3.21), #5 (08.31.21), #6 (09.28.21), #7 (10.26.21), #8 (11.23.21), #9 (12.22.21), #10 (1.24.22), #11 (2.22.22)  - BCVA 20/20 OD  - exam with focal edema, IRH, CWS superonasal macula -- improving  - OCT shows mild interval improvement in IRF SN macula -- non-central  - recommend IVE OD #12 today, 03.22.22  - RBA of procedure discussed, questions answered  - informed consent obtained  - planning for focal laser once edema is reduced significantly  - Avastin informed consent form signed and scanned on 01.19.21  - Eylea informed consent form signed and scanned on 05.11.21  - see procedure note  - Eylea4U benefits investigation started, 05.11.21 -- approved through [employer] through 12.31.22  - patient states that he has obtained approval for [facility] 2022  - F/U 4 weeks -- DFE/OCT/possible injection  3. Diabetes mellitus, type 2 without retinopathy OU  - The incidence, risk factors for progression, natural history and treatment options for diabetic retinopathy  were discussed with patient.    - The  need for close monitoring of blood glucose, blood pressure, and serum lipids, avoiding cigarette or any type of tobacco, and the need for long term follow up was also discussed with patient.  - f/u in 1 year, sooner prn  4,5. Hypertensive retinopathy OU  - discussed importance of tight BP control  - monitor  6. Age related cataracts OU   - The symptoms of cataract, surgical options, and  treatments and risks were discussed with patient.  - discussed diagnosis and progression  - not yet visually significant  - monitor for now  Ophthalmic Meds Ordered this visit:  Meds ordered this encounter  Medications  . aflibercept (EYLEA) SOLN 2 mg      Return in about 4 weeks (around 04/04/2021) for DFE, OCT.  There are no Patient Instructions on file for this visit.  This document serves as a record of services personally performed by Karie Chimera, MD, PhD. It was created on their behalf by Cristopher Estimable, COT an ophthalmic technician. The creation of this record is the provider's dictation and/or activities during the visit.    Electronically signed by: Cristopher Estimable, COT 3.21.22 @ 9:42 PM  Karie Chimera, M.D., Ph.D. Diseases & Surgery of the Retina and Vitreous [facility] 03/07/2021   I have reviewed the above documentation for accuracy and completeness, and I agree with the above. Karie Chimera, M.D., Ph.D. 03/07/21 9:42 PM   Abbreviations: M myopia (nearsighted); A astigmatism; H hyperopia (farsighted); P presbyopia; Mrx spectacle prescription;  CTL contact lenses; OD right eye; OS left eye; OU both eyes  XT exotropia; ET esotropia; PEK punctate epithelial keratitis; PEE punctate epithelial erosions; DES dry eye syndrome; MGD meibomian gland dysfunction; ATs artificial tears; PFAT's preservative free artificial tears; NSC nuclear sclerotic cataract; PSC posterior subcapsular cataract; ERM epi-retinal membrane; PVD posterior vitreous detachment; RD retinal detachment; DM diabetes mellitus; DR diabetic retinopathy; NPDR non-proliferative diabetic retinopathy; PDR proliferative diabetic retinopathy; CSME clinically significant macular edema; DME diabetic macular edema; dbh dot blot hemorrhages; CWS cotton wool spot; POAG primary open angle glaucoma; C/D cup-to-disc ratio; HVF humphrey visual field; GVF goldmann visual field; OCT optical coherence  tomography; IOP intraocular pressure; BRVO Branch retinal vein occlusion; CRVO central retinal vein occlusion; CRAO central retinal artery occlusion; BRAO branch retinal artery occlusion; RT retinal tear; SB scleral buckle; PPV pars plana vitrectomy; VH Vitreous hemorrhage; PRP panretinal laser photocoagulation; IVK intravitreal kenalog; VMT vitreomacular traction; MH Macular hole;  NVD neovascularization of the disc; NVE neovascularization elsewhere; AREDS age related eye disease study; ARMD age related macular degeneration; POAG primary open angle glaucoma; EBMD epithelial/anterior basement membrane dystrophy; ACIOL anterior chamber intraocular lens; IOL intraocular lens; PCIOL posterior chamber intraocular lens; Phaco/IOL phacoemulsification with intraocular lens placement; PRK photorefractive keratectomy; LASIK laser assisted in situ keratomileusis; HTN hypertension; DM diabetes mellitus; COPD chronic obstructive pulmonary disease

## 2021-03-07 ENCOUNTER — Other Ambulatory Visit: Payer: Self-pay

## 2021-03-07 ENCOUNTER — Ambulatory Visit (INDEPENDENT_AMBULATORY_CARE_PROVIDER_SITE_OTHER): Payer: Medicare Other | Admitting: Ophthalmology

## 2021-03-07 ENCOUNTER — Encounter (INDEPENDENT_AMBULATORY_CARE_PROVIDER_SITE_OTHER): Payer: Self-pay | Admitting: Ophthalmology

## 2021-03-07 DIAGNOSIS — H34831 Tributary (branch) retinal vein occlusion, right eye, with macular edema: Secondary | ICD-10-CM | POA: Diagnosis not present

## 2021-03-07 DIAGNOSIS — I10 Essential (primary) hypertension: Secondary | ICD-10-CM | POA: Diagnosis not present

## 2021-03-07 DIAGNOSIS — E119 Type 2 diabetes mellitus without complications: Secondary | ICD-10-CM | POA: Diagnosis not present

## 2021-03-07 DIAGNOSIS — H3581 Retinal edema: Secondary | ICD-10-CM

## 2021-03-07 DIAGNOSIS — H25813 Combined forms of age-related cataract, bilateral: Secondary | ICD-10-CM

## 2021-03-07 DIAGNOSIS — H35033 Hypertensive retinopathy, bilateral: Secondary | ICD-10-CM

## 2021-03-07 MED ORDER — AFLIBERCEPT 2MG/0.05ML IZ SOLN FOR KALEIDOSCOPE
2.0000 mg | INTRAVITREAL | Status: AC | PRN
  Administered 2021-03-07: 2 mg via INTRAVITREAL

## 2021-04-03 NOTE — Progress Notes (Signed)
[facility] - Clinic Note  04/04/2021     CHIEF COMPLAINT Patient presents for Retina Follow Up   HISTORY OF PRESENT ILLNESS: Kurt [name] is a 70 y.o. male who presents to the clinic today for:  HPI    Retina Follow Up    Patient presents with  CRVO/BRVO.  In right eye.  This started 4 weeks ago.  I, the attending physician,  performed the HPI with the patient and updated documentation appropriately.          Comments    Patient here for 4 weeks retina follow up for DFE. Patient states vision doing ok. Blurry sometimes with allergies. Itches sometimes too. No eye pain.       Last edited by Rennis Chris, MD on 04/04/2021  3:40 PM. (History)      Referring physician:  Altamease Oiler, FNP [address] [place],  [place] [address]  HISTORICAL INFORMATION:   Selected notes from the MEDICAL RECORD NUMBER Diabetic Eval per Dr. Karleen Hampshire   CURRENT MEDICATIONS: No current outpatient medications on file. (Ophthalmic Drugs)   No current facility-administered medications for this visit. (Ophthalmic Drugs)   Current Outpatient Medications (Other)  Medication Sig  . acetaminophen (TYLENOL) 650 MG CR tablet Take 1,300 mg by mouth every 8 (eight) hours as needed for pain.  Marland Kitchen apixaban (ELIQUIS) 5 MG TABS tablet Take 2 tablets (10mg ) twice daily for 7 days, then 1 tablet (5mg ) twice daily  . aspirin 81 MG chewable tablet Chew 1 tablet (81 mg total) by mouth daily.  glipiZIDE (GLUCOTROL XL) 10 MG 24 hr tablet Take 10 mg by mouth at bedtime.   LANTUS SOLOSTAR 100 UNIT/ML Solostar Pen Inject 34 Units into the skin at bedtime.  Marland Kitchen lisinopril (PRINIVIL,ZESTRIL) 20 MG tablet Take 20 mg by mouth daily.  . methocarbamol (ROBAXIN) 500 MG tablet Take 500 mg by mouth at bedtime as needed for muscle spasms.  Marland Kitchen ULTRA test strip 1 each 2 (two) times daily.  Marland Kitchen oxyCODONE (OXY IR/ROXICODONE) 5 MG immediate release tablet Take 1 tablet (5 mg total) by mouth every 8  (eight) hours as needed for moderate pain (pain score 4-6). (Patient not taking: No sig reported)  . simvastatin (ZOCOR) 20 MG tablet Take 20 mg by mouth every evening.  . SitaGLIPtin-MetFORMIN HCl [phone] MG TB24 Take 1 tablet by mouth at bedtime.   No current facility-administered medications for this visit. (Other)   REVIEW OF SYSTEMS: ROS    Positive for: Gastrointestinal, Musculoskeletal, Endocrine, Eyes   Negative for: Constitutional, Neurological, Skin, Genitourinary, HENT, Cardiovascular, Respiratory, Psychiatric, Allergic/Imm, Heme/Lymph   Last edited by Letta Pate, COA on 04/04/2021  3:12 PM. (History)     ALLERGIES Allergies  Allergen Reactions  . Augmentin [Amoxicillin-Pot Clavulanate] Nausea And Vomiting  . Iodine Hives  . Tape Rash    Paper    PAST MEDICAL HISTORY Past Medical History:  Diagnosis Date  . Arthritis   . Bronchitis   . Bronchitis   . Cataract    OU  . Concussion    late 1990's  after a fall  . GERD (gastroesophageal reflux disease)   . History of kidney stones   . Hypertension   . Hypertensive retinopathy    OU  . Osteogenesis imperfecta   . PONV (postoperative nausea and vomiting)    pt has post polio syndrome  . Post-polio syndrome   . Type 2 diabetes mellitus (HCC) 12/25/2018  Past Surgical History:  Procedure Laterality Date  . ANKLE FRACTURE SURGERY Bilateral   . arm surgery Right    nerve surgery  . COLONOSCOPY    . ELBOW FRACTURE SURGERY Left   . EYE MUSCLE SURGERY Left   . LEG SURGERY Left    femur fracture with rod  . REVERSE SHOULDER ARTHROPLASTY Right 11/10/2019   Procedure: RIGHT REVERSE SHOULDER ARTHROPLASTY;  Surgeon: Cammy Copa, MD;  Location: [facility] OR;  Service: Orthopedics;  Laterality: Right;  . TONSILLECTOMY     FAMILY HISTORY Family History  Problem Relation Age of Onset  . Hypertension Mother   . Diabetes Brother    SOCIAL HISTORY Social History   Tobacco Use  . Smoking status: Never Smoker   . Smokeless tobacco: Never Used  Vaping Use  . Vaping Use: Never used  Substance Use Topics  . Alcohol use: Yes    Comment: 1 beer occasionally  . Drug use: No         OPHTHALMIC EXAM:  Base Eye Exam    Visual Acuity (Snellen - Linear)      Right Left   Dist cc 20/25 -2 20/20   Dist ph cc 20/20 -2    Correction: Glasses       Tonometry (Tonopen, 3:09 PM)      Right Left   Pressure 14 15       Pupils      Dark Light Shape React APD   Right 3 2 Round Brisk None   Left 3 2 Round Brisk None       Visual Fields (Counting fingers)      Left Right    Full Full       Extraocular Movement      Right Left    Full Full       Neuro/Psych    Oriented x3: Yes   Mood/Affect: Normal       Dilation    Both eyes: 1.0% Mydriacyl, 2.5% Phenylephrine @ 3:09 PM        Slit Lamp and Fundus Exam    Slit Lamp Exam      Right Left   Lids/Lashes Dermatochalasis - upper lid, mild Meibomian gland dysfunction Dermatochalasis - upper lid, Dermatochalasis - lower lid, mild Meibomian gland dysfunction   Conjunctiva/Sclera blue sclera blue sclera   Cornea arcus arcus   Anterior Chamber deep and clear deep and clear   Iris round and dilated, no NVI round and dilated, no NVI   Lens 2+ NS, 2+CS 2+ NS, 2+CS   Vitreous mild syneresis, PVD mild syneresis       Fundus Exam      Right Left   Disc pink and sharp pink and sharp   C/D Ratio 0.5 0.4   Macula Good foveal reflex; persistent, focal edema SN macula -- improving, focal exudate superior macula - improving, almost resolved flat, good foveal reflex, mild RPE mottling and clumping, +drusen, No heme or edema   Vessels attenuated, mild tortuousity mild attenuation and tortuosity   Periphery attached, no heme attached, no heme        Refraction    Wearing Rx      Sphere Cylinder Axis Add   Right -2.00 +2.25 111 +2.75   Left -1.50 +1.50 094 2.75         IMAGING AND PROCEDURES  Imaging and Procedures for @TODAY @  OCT,  Retina - OU - Both Eyes       Right Eye  Quality was good. Central Foveal Thickness: 241. Progression has improved. Findings include no SRF, intraretinal fluid, retinal drusen , normal foveal contour (Mild interval improvement in superior IRF).   Left Eye Quality was good. Central Foveal Thickness: 255. Progression has been stable. Findings include normal foveal contour, no IRF, no SRF, vitreomacular adhesion , retinal drusen  (Focal PED/druse temporal fovea).   Notes *Images captured and stored on drive  Diagnosis / Impression:  OD: BRVO with Mild interval improvement in superior IRF OS: NFP, no SRF/IRF  Clinical management:  See below  Abbreviations: NFP - Normal foveal profile. CME - cystoid macular edema. PED - pigment epithelial detachment. IRF - intraretinal fluid. SRF - subretinal fluid. EZ - ellipsoid zone. ERM - epiretinal membrane. ORA - outer retinal atrophy. ORT - outer retinal tubulation. SRHM - subretinal hyper-reflective material         Intravitreal Injection, Pharmacologic Agent - OD - Right Eye       Time Out 04/04/2021. 3:21 PM. Confirmed correct patient, procedure, site, and patient consented.   Anesthesia Topical anesthesia was used. Anesthetic medications included Lidocaine 2%, Proparacaine 0.5%.   Procedure Preparation included 5% betadine to ocular surface, eyelid speculum.   Injection:  2 mg aflibercept Gretta Cool) SOLN   NDC: L6038910, Lot: [id number], Expiration date: 12/16/2021   Route: Intravitreal, Site: Right Eye, Waste: 0.05 mL  Post-op Post injection exam found visual acuity of at least counting fingers. The patient tolerated the procedure well. There were no complications. The patient received written and verbal post procedure care education. Post injection medications were not given.                 ASSESSMENT/PLAN:    ICD-10-CM   1. Branch retinal vein occlusion of right eye with macular edema  H34.8310 Intravitreal  Injection, Pharmacologic Agent - OD - Right Eye    aflibercept (EYLEA) SOLN 2 mg  2. Retinal edema  H35.81 OCT, Retina - OU - Both Eyes  3. Diabetes mellitus type 2 without retinopathy (HCC)  E11.9   4. Essential hypertension  I10   5. Hypertensive retinopathy of both eyes  H35.033   6. Combined forms of age-related cataract of both eyes  H25.813    1,2. BRVO with CME OD  - s/p IVA OD #1 (01.19.21), #2 (02.16.21), #3 (03.16.21), #4 (4.13.21) -- IVA resistance             - s/p IVE OD #1 (05.11.21--sample), #2 (06.08.21), #3 (07.06.21), #4 (8.3.21), #5 (08.31.21), #6 (09.28.21), #7 (10.26.21), #8 (11.23.21), #9 (12.22.21), #10 (1.24.22), #11 (2.22.22), #12 (3.22.22)  - BCVA 20/20 OD  - exam with focal edema, IRH, CWS superonasal macula -- improving  - OCT shows mild interval improvement in IRF SN macula -- non-central (improvement slowing down)  - recommend IVE OD #13 today, 4.19.22  - RBA of procedure discussed, questions answered  - informed consent obtained  - planning for focal laser once edema is reduced significantly  - Avastin informed consent form signed and scanned on 01.19.21  - Eylea informed consent form signed and scanned on 05.11.21  - see procedure note  - Eylea4U benefits investigation started, 05.11.21 -- approved through [employer] through 12.31.22  - F/U 5 weeks -- DFE/OCT/possible injection vs focal laser  3. Diabetes mellitus, type 2 without retinopathy OU  - The incidence, risk factors for progression, natural history and treatment options for diabetic retinopathy  were discussed with patient.    - The need for close monitoring  of blood glucose, blood pressure, and serum lipids, avoiding cigarette or any type of tobacco, and the need for long term follow up was also discussed with patient.  - f/u in 1 year, sooner prn  4,5. Hypertensive retinopathy OU  - discussed importance of tight BP control  - monitor  6. Age related cataracts OU   - The symptoms of cataract,  surgical options, and treatments and risks were discussed with patient.  - discussed diagnosis and progression  - not yet visually significant  - monitor for now  Ophthalmic Meds Ordered this visit:  Meds ordered this encounter  Medications  . aflibercept (EYLEA) SOLN 2 mg      Return in about 5 weeks (around 05/09/2021) for f/u BRVO OD, DFE, OCT.  There are no Patient Instructions on file for this visit.  This document serves as a record of services personally performed by Karie ChimeraBrian G. Tylon Kemmerling, MD, PhD. It was created on their behalf by Cristopher EstimableAndrew Baxley, COT an ophthalmic technician. The creation of this record is the provider's dictation and/or activities during the visit.    Electronically signed by: Cristopher Estimablendrew Baxley, [place] 4.18.22 @ 11:08 PM  Karie ChimeraBrian G. Anaiya Wisinski, M.D., Ph.D. Diseases & Surgery of the Retina and Vitreous [facility] 04/04/2021   I have reviewed the above documentation for accuracy and completeness, and I agree with the above. Karie ChimeraBrian G. Chasty Randal, M.D., Ph.D. 04/04/21 11:08 PM   Abbreviations: M myopia (nearsighted); A astigmatism; H hyperopia (farsighted); P presbyopia; Mrx spectacle prescription;  CTL contact lenses; OD right eye; OS left eye; OU both eyes  XT exotropia; ET esotropia; PEK punctate epithelial keratitis; PEE punctate epithelial erosions; DES dry eye syndrome; MGD meibomian gland dysfunction; ATs artificial tears; PFAT's preservative free artificial tears; NSC nuclear sclerotic cataract; PSC posterior subcapsular cataract; ERM epi-retinal membrane; PVD posterior vitreous detachment; RD retinal detachment; DM diabetes mellitus; DR diabetic retinopathy; NPDR non-proliferative diabetic retinopathy; PDR proliferative diabetic retinopathy; CSME clinically significant macular edema; DME diabetic macular edema; dbh dot blot hemorrhages; CWS cotton wool spot; POAG primary open angle glaucoma; C/D cup-to-disc ratio; HVF humphrey visual field; GVF goldmann  visual field; OCT optical coherence tomography; IOP intraocular pressure; BRVO Branch retinal vein occlusion; CRVO central retinal vein occlusion; CRAO central retinal artery occlusion; BRAO branch retinal artery occlusion; RT retinal tear; SB scleral buckle; PPV pars plana vitrectomy; VH Vitreous hemorrhage; PRP panretinal laser photocoagulation; IVK intravitreal kenalog; VMT vitreomacular traction; MH Macular hole;  NVD neovascularization of the disc; NVE neovascularization elsewhere; AREDS age related eye disease study; ARMD age related macular degeneration; POAG primary open angle glaucoma; EBMD epithelial/anterior basement membrane dystrophy; ACIOL anterior chamber intraocular lens; IOL intraocular lens; PCIOL posterior chamber intraocular lens; Phaco/IOL phacoemulsification with intraocular lens placement; PRK photorefractive keratectomy; LASIK laser assisted in situ keratomileusis; HTN hypertension; DM diabetes mellitus; COPD chronic obstructive pulmonary disease

## 2021-04-04 ENCOUNTER — Ambulatory Visit (INDEPENDENT_AMBULATORY_CARE_PROVIDER_SITE_OTHER): Payer: Medicare Other | Admitting: Ophthalmology

## 2021-04-04 ENCOUNTER — Other Ambulatory Visit: Payer: Self-pay

## 2021-04-04 ENCOUNTER — Encounter (INDEPENDENT_AMBULATORY_CARE_PROVIDER_SITE_OTHER): Payer: Self-pay | Admitting: Ophthalmology

## 2021-04-04 DIAGNOSIS — H34831 Tributary (branch) retinal vein occlusion, right eye, with macular edema: Secondary | ICD-10-CM | POA: Diagnosis not present

## 2021-04-04 DIAGNOSIS — I10 Essential (primary) hypertension: Secondary | ICD-10-CM

## 2021-04-04 DIAGNOSIS — H3581 Retinal edema: Secondary | ICD-10-CM | POA: Diagnosis not present

## 2021-04-04 DIAGNOSIS — E119 Type 2 diabetes mellitus without complications: Secondary | ICD-10-CM | POA: Diagnosis not present

## 2021-04-04 DIAGNOSIS — H25813 Combined forms of age-related cataract, bilateral: Secondary | ICD-10-CM

## 2021-04-04 DIAGNOSIS — H35033 Hypertensive retinopathy, bilateral: Secondary | ICD-10-CM

## 2021-04-04 MED ORDER — AFLIBERCEPT 2MG/0.05ML IZ SOLN FOR KALEIDOSCOPE
2.0000 mg | INTRAVITREAL | Status: AC | PRN
Start: 2021-04-04 — End: 2021-04-04
  Administered 2021-04-04: 2 mg via INTRAVITREAL

## 2021-05-05 NOTE — Progress Notes (Signed)
[facility] - Clinic Note  05/10/2021     CHIEF COMPLAINT Patient presents for Retina Follow Up   HISTORY OF PRESENT ILLNESS: Kurt [name] is a 70 y.o. male who presents to the clinic today for:  HPI    Retina Follow Up    Patient presents with  CRVO/BRVO.  In right eye.  This started weeks ago.  Severity is moderate.  Duration of weeks.  Since onset it is stable.  I, the attending physician,  performed the HPI with the patient and updated documentation appropriately.          Comments    Pt states vision is the same OU.  Pt complains of bloodshot left eye over the last couple of days.  Pt has occasional eye pain--believes to be from allergies.       Last edited by Rennis Chris, MD on 05/11/2021 11:05 PM. (History)    Pt started Eliquis 2 mos ago; likely cause for [facility] OS today.  Referring physician:  Altamease Oiler, FNP [address] [place],  [place] [address]  HISTORICAL INFORMATION:   Selected notes from the MEDICAL RECORD NUMBER Diabetic Eval per Dr. Karleen Hampshire   CURRENT MEDICATIONS: No current outpatient medications on file. (Ophthalmic Drugs)   No current facility-administered medications for this visit. (Ophthalmic Drugs)   Current Outpatient Medications (Other)  Medication Sig  . acetaminophen (TYLENOL) 650 MG CR tablet Take 1,300 mg by mouth every 8 (eight) hours as needed for pain.  Marland Kitchen apixaban (ELIQUIS) 5 MG TABS tablet Take 2 tablets (10mg ) twice daily for 7 days, then 1 tablet (5mg ) twice daily  . aspirin 81 MG chewable tablet Chew 1 tablet (81 mg total) by mouth daily.  glipiZIDE (GLUCOTROL XL) 10 MG 24 hr tablet Take 10 mg by mouth at bedtime.   LANTUS SOLOSTAR 100 UNIT/ML Solostar Pen Inject 34 Units into the skin at bedtime.  Marland Kitchen lisinopril (PRINIVIL,ZESTRIL) 20 MG tablet Take 20 mg by mouth daily.  . methocarbamol (ROBAXIN) 500 MG tablet Take 500 mg by mouth at bedtime as needed for muscle spasms.  Marland Kitchen ULTRA test strip  1 each 2 (two) times daily.  Marland Kitchen oxyCODONE (OXY IR/ROXICODONE) 5 MG immediate release tablet Take 1 tablet (5 mg total) by mouth every 8 (eight) hours as needed for moderate pain (pain score 4-6). (Patient not taking: No sig reported)  . simvastatin (ZOCOR) 20 MG tablet Take 20 mg by mouth every evening.  . SitaGLIPtin-MetFORMIN HCl [phone] MG TB24 Take 1 tablet by mouth at bedtime.   No current facility-administered medications for this visit. (Other)   REVIEW OF SYSTEMS: ROS    Positive for: Gastrointestinal, Musculoskeletal, Endocrine, Eyes   Negative for: Constitutional, Neurological, Skin, Genitourinary, HENT, Cardiovascular, Respiratory, Psychiatric, Allergic/Imm, Heme/Lymph   Last edited by Letta Pate on 05/10/2021  2:39 PM. (History)     ALLERGIES Allergies  Allergen Reactions  . Augmentin [Amoxicillin-Pot Clavulanate] Nausea And Vomiting  . Iodine Hives  . Tape Rash    Paper    PAST MEDICAL HISTORY Past Medical History:  Diagnosis Date  . Arthritis   . Bronchitis   . Bronchitis   . Cataract    OU  . Concussion    late 1990's  after a fall  . GERD (gastroesophageal reflux disease)   . History of kidney stones   . Hypertension   . Hypertensive retinopathy    OU  . Osteogenesis imperfecta   .  PONV (postoperative nausea and vomiting)    pt has post polio syndrome  . Post-polio syndrome   . Type 2 diabetes mellitus (HCC) 12/25/2018   Past Surgical History:  Procedure Laterality Date  . ANKLE FRACTURE SURGERY Bilateral   . arm surgery Right    nerve surgery  . COLONOSCOPY    . ELBOW FRACTURE SURGERY Left   . EYE MUSCLE SURGERY Left   . LEG SURGERY Left    femur fracture with rod  . REVERSE SHOULDER ARTHROPLASTY Right 11/10/2019   Procedure: RIGHT REVERSE SHOULDER ARTHROPLASTY;  Surgeon: Cammy Copaean, Gregory Scott, MD;  [facility];  Service: Orthopedics;  Laterality: Right;  . TONSILLECTOMY     FAMILY HISTORY Family History  Problem Relation Age of Onset   . Hypertension Mother   . Diabetes Brother    SOCIAL HISTORY Social History   Tobacco Use  . Smoking status: Never Smoker  . Smokeless tobacco: Never Used  Vaping Use  . Vaping Use: Never used  Substance Use Topics  . Alcohol use: Yes    Comment: 1 beer occasionally  . Drug use: No         OPHTHALMIC EXAM:  Base Eye Exam    Visual Acuity (Snellen - Linear)      Right Left   Dist cc 20/25 -2 20/20 -2   Dist ph cc NI    Correction: Glasses       Tonometry (Tonopen, 2:44 PM)      Right Left   Pressure 15 16       Pupils      Dark Light Shape React APD   Right 3 2 Round Brisk 0   Left 3 2 Round Brisk 0       Visual Fields      Left Right    Full Full       Extraocular Movement      Right Left    Full Full       Neuro/Psych    Oriented x3: Yes   Mood/Affect: Normal       Dilation    Both eyes: 1.0% Mydriacyl, 2.5% Phenylephrine @ 2:44 PM        Slit Lamp and Fundus Exam    Slit Lamp Exam      Right Left   Lids/Lashes Dermatochalasis - upper lid, mild Meibomian gland dysfunction Dermatochalasis - upper lid, Dermatochalasis - lower lid, mild Meibomian gland dysfunction   Conjunctiva/Sclera blue sclera blue sclera   Cornea arcus, 1+ PEE, mild tear film debris arcus, SCH   Anterior Chamber deep and clear deep and clear   Iris round and dilated, no NVI round and dilated, no NVI   Lens 2-3+ NS w/brunescence, 2-3+CS 2-3+ NS w/brunesecence, 2-3+CS   Vitreous mild syneresis, PVD mild syneresis       Fundus Exam      Right Left   Disc pink and sharp pink and sharp   C/D Ratio 0.5 0.4   Macula Good foveal reflex; persistent, focal edema SN macula -- improving, focal exudate superior macula - improving, almost resolved flat, good foveal reflex, mild RPE mottling and clumping, +drusen, No heme or edema   Vessels attenuated, mild tortuousity, mild AV crossing changes mild attenuation and tortuosity, mild AV crossing changes, mild copper wiring   Periphery  attached, no heme attached, no heme        Refraction    Wearing Rx      Sphere Cylinder Axis Add  Right -2.00 +2.25 111 +2.75   Left -1.50 +1.50 094 2.75         IMAGING AND PROCEDURES  Imaging and Procedures for @TODAY @  OCT, Retina - OU - Both Eyes       Right Eye Quality was good. Central Foveal Thickness: 241. Progression has improved. Findings include no SRF, intraretinal fluid, retinal drusen , normal foveal contour (Mild interval improvement in superior IRF).   Left Eye Quality was good. Central Foveal Thickness: 257. Progression has been stable. Findings include normal foveal contour, no IRF, no SRF, vitreomacular adhesion , retinal drusen  (Focal PED/druse temporal fovea).   Notes *Images captured and stored on drive  Diagnosis / Impression:  OD: BRVO with mild interval improvement in superior IRF OS: NFP, no SRF/IRF, drusen, VMA  Clinical management:  See below  Abbreviations: NFP - Normal foveal profile. CME - cystoid macular edema. PED - pigment epithelial detachment. IRF - intraretinal fluid. SRF - subretinal fluid. EZ - ellipsoid zone. ERM - epiretinal membrane. ORA - outer retinal atrophy. ORT - outer retinal tubulation. SRHM - subretinal hyper-reflective material         Intravitreal Injection, Pharmacologic Agent - OD - Right Eye       Time Out 05/10/2021. 3:40 PM. Confirmed correct patient, procedure, site, and patient consented.   Anesthesia Topical anesthesia was used. Anesthetic medications included Lidocaine 2%, Proparacaine 0.5%.   Procedure Preparation included eyelid speculum. A (32g) needle was used.   Injection:  2 mg aflibercept 05/12/2021) SOLN   NDC: Gretta Cool, Lot: [id number], Expiration date: 02/13/2022   Route: Intravitreal, Site: Right Eye, Waste: 0.05 mL  Post-op Post injection exam found visual acuity of at least counting fingers. The patient tolerated the procedure well. There were no complications. The patient received  written and verbal post procedure care education.                 ASSESSMENT/PLAN:    ICD-10-CM   1. Branch retinal vein occlusion of right eye with macular edema  H34.8310 Intravitreal Injection, Pharmacologic Agent - OD - Right Eye    aflibercept (EYLEA) SOLN 2 mg  2. Retinal edema  H35.81 OCT, Retina - OU - Both Eyes  3. Diabetes mellitus type 2 without retinopathy (HCC)  E11.9   4. Essential hypertension  I10   5. Hypertensive retinopathy of both eyes  H35.033   6. Combined forms of age-related cataract of both eyes  H25.813    1,2. BRVO with CME OD  - s/p IVA OD #1 (01.19.21), #2 (02.16.21), #3 (03.16.21), #4 (4.13.21) -- IVA resistance             - s/p IVE OD #1 (05.11.21--sample), #2 (06.08.21), #3 (07.06.21), #4 (8.3.21), #5 (08.31.21), #6 (09.28.21), #7 (10.26.21), #8 (11.23.21), #9 (12.22.21), #10 (1.24.22), #11 (2.22.22), #12 (3.22.22), #13 (04.19.22)  - BCVA 20/25 OD  - exam with focal edema, IRH, CWS superonasal macula -- improving  - OCT shows mild interval improvement in IRF SN macula -- non-central  - recommend IVE OD #14 today, 5.25.22  - RBA of procedure discussed, questions answered  - informed consent obtained  - planning for focal laser once edema is reduced significantly  - Avastin informed consent form signed and scanned on 01.19.21  - Eylea informed consent form signed and scanned on 05.11.21  - see procedure note  - Eylea4U benefits investigation started, 05.11.21 -- approved through [employer] through 12.31.22  - F/U 5 weeks -- DFE/OCT/possible injection vs focal  laser  3. Diabetes mellitus, type 2 without retinopathy OU  - The incidence, risk factors for progression, natural history and treatment options for diabetic retinopathy  were discussed with patient.    - The need for close monitoring of blood glucose, blood pressure, and serum lipids, avoiding cigarette or any type of tobacco, and the need for long term follow up was also discussed with  patient.  - f/u in 1 year, sooner prn  4,5. Hypertensive retinopathy OU  - discussed importance of tight BP control  - monitor  6. Age related cataracts OU   - The symptoms of cataract, surgical options, and treatments and risks were discussed with patient.  - discussed diagnosis and progression  - not yet visually significant  - monitor for now  Ophthalmic Meds Ordered this visit:  Meds ordered this encounter  Medications  . aflibercept (EYLEA) SOLN 2 mg      Return in about 5 weeks (around 06/14/2021) for 5 wk f/u for BRVO OD w/DFE/OCT/poss. inj. vs. focal laser.  There are no Patient Instructions on file for this visit.  This document serves as a record of services personally performed by Karie Chimera, MD, PhD. It was created on their behalf by Annalee Genta, COMT. The creation of this record is the provider's dictation and/or activities during the visit.  Electronically signed by: Annalee Genta, COMT 05/11/21 11:07 PM  This document serves as a record of services personally performed by Karie Chimera, MD, PhD. It was created on their behalf by Cristopher Estimable, COT an ophthalmic technician. The creation of this record is the provider's dictation and/or activities during the visit.    Electronically signed by: Cristopher Estimable, COT 5.25.22 @ 11:07 PM  Karie Chimera, M.D., Ph.D. Diseases & Surgery of the Retina and Vitreous [facility] 5.25.22  I have reviewed the above documentation for accuracy and completeness, and I agree with the above. Karie Chimera, M.D., Ph.D. 05/11/21 11:07 PM   Abbreviations: M myopia (nearsighted); A astigmatism; H hyperopia (farsighted); P presbyopia; Mrx spectacle prescription;  CTL contact lenses; OD right eye; OS left eye; OU both eyes  XT exotropia; ET esotropia; PEK punctate epithelial keratitis; PEE punctate epithelial erosions; DES dry eye syndrome; MGD meibomian gland dysfunction; ATs artificial tears; PFAT's  preservative free artificial tears; NSC nuclear sclerotic cataract; PSC posterior subcapsular cataract; ERM epi-retinal membrane; PVD posterior vitreous detachment; RD retinal detachment; DM diabetes mellitus; DR diabetic retinopathy; NPDR non-proliferative diabetic retinopathy; PDR proliferative diabetic retinopathy; CSME clinically significant macular edema; DME diabetic macular edema; dbh dot blot hemorrhages; CWS cotton wool spot; POAG primary open angle glaucoma; C/D cup-to-disc ratio; HVF humphrey visual field; GVF goldmann visual field; OCT optical coherence tomography; IOP intraocular pressure; BRVO Branch retinal vein occlusion; CRVO central retinal vein occlusion; CRAO central retinal artery occlusion; BRAO branch retinal artery occlusion; RT retinal tear; SB scleral buckle; PPV pars plana vitrectomy; VH Vitreous hemorrhage; PRP panretinal laser photocoagulation; IVK intravitreal kenalog; VMT vitreomacular traction; MH Macular hole;  NVD neovascularization of the disc; NVE neovascularization elsewhere; AREDS age related eye disease study; ARMD age related macular degeneration; POAG primary open angle glaucoma; EBMD epithelial/anterior basement membrane dystrophy; ACIOL anterior chamber intraocular lens; IOL intraocular lens; PCIOL posterior chamber intraocular lens; Phaco/IOL phacoemulsification with intraocular lens placement; PRK photorefractive keratectomy; LASIK laser assisted in situ keratomileusis; HTN hypertension; DM diabetes mellitus; COPD chronic obstructive pulmonary disease

## 2021-05-10 ENCOUNTER — Other Ambulatory Visit: Payer: Self-pay

## 2021-05-10 ENCOUNTER — Ambulatory Visit (INDEPENDENT_AMBULATORY_CARE_PROVIDER_SITE_OTHER): Payer: Medicare Other | Admitting: Ophthalmology

## 2021-05-10 DIAGNOSIS — I10 Essential (primary) hypertension: Secondary | ICD-10-CM | POA: Diagnosis not present

## 2021-05-10 DIAGNOSIS — H25813 Combined forms of age-related cataract, bilateral: Secondary | ICD-10-CM

## 2021-05-10 DIAGNOSIS — E119 Type 2 diabetes mellitus without complications: Secondary | ICD-10-CM | POA: Diagnosis not present

## 2021-05-10 DIAGNOSIS — H34831 Tributary (branch) retinal vein occlusion, right eye, with macular edema: Secondary | ICD-10-CM | POA: Diagnosis not present

## 2021-05-10 DIAGNOSIS — H3581 Retinal edema: Secondary | ICD-10-CM | POA: Diagnosis not present

## 2021-05-10 DIAGNOSIS — H35033 Hypertensive retinopathy, bilateral: Secondary | ICD-10-CM

## 2021-05-11 ENCOUNTER — Encounter (INDEPENDENT_AMBULATORY_CARE_PROVIDER_SITE_OTHER): Payer: Self-pay | Admitting: Ophthalmology

## 2021-05-11 DIAGNOSIS — H34831 Tributary (branch) retinal vein occlusion, right eye, with macular edema: Secondary | ICD-10-CM

## 2021-05-11 MED ORDER — AFLIBERCEPT 2MG/0.05ML IZ SOLN FOR KALEIDOSCOPE
2.0000 mg | INTRAVITREAL | Status: AC | PRN
  Administered 2021-05-11: 2 mg via INTRAVITREAL

## 2021-06-08 ENCOUNTER — Inpatient Hospital Stay (HOSPITAL_COMMUNITY)
Admission: EM | Admit: 2021-06-08 | Discharge: 2021-06-12 | DRG: 603 | Disposition: A | Discharge status: Home / Self Care | Payer: Medicare Other | Attending: Family Medicine | Admitting: Family Medicine

## 2021-06-08 ENCOUNTER — Encounter (HOSPITAL_COMMUNITY): Payer: Self-pay | Admitting: *Deleted

## 2021-06-08 DIAGNOSIS — G14 Postpolio syndrome: Secondary | ICD-10-CM | POA: Diagnosis present

## 2021-06-08 DIAGNOSIS — L03115 Cellulitis of right lower limb: Secondary | ICD-10-CM | POA: Diagnosis present

## 2021-06-08 DIAGNOSIS — Z794 Long term (current) use of insulin: Secondary | ICD-10-CM

## 2021-06-08 DIAGNOSIS — Z880 Allergy status to penicillin: Secondary | ICD-10-CM

## 2021-06-08 DIAGNOSIS — L03116 Cellulitis of left lower limb: Secondary | ICD-10-CM | POA: Diagnosis not present

## 2021-06-08 DIAGNOSIS — Q780 Osteogenesis imperfecta: Secondary | ICD-10-CM

## 2021-06-08 DIAGNOSIS — L0390 Cellulitis, unspecified: Secondary | ICD-10-CM | POA: Diagnosis present

## 2021-06-08 DIAGNOSIS — Z833 Family history of diabetes mellitus: Secondary | ICD-10-CM

## 2021-06-08 DIAGNOSIS — K219 Gastro-esophageal reflux disease without esophagitis: Secondary | ICD-10-CM | POA: Diagnosis present

## 2021-06-08 DIAGNOSIS — I878 Other specified disorders of veins: Secondary | ICD-10-CM | POA: Diagnosis present

## 2021-06-08 DIAGNOSIS — E785 Hyperlipidemia, unspecified: Secondary | ICD-10-CM | POA: Diagnosis present

## 2021-06-08 DIAGNOSIS — Z66 Do not resuscitate: Secondary | ICD-10-CM | POA: Diagnosis present

## 2021-06-08 DIAGNOSIS — Z888 Allergy status to other drugs, medicaments and biological substances status: Secondary | ICD-10-CM

## 2021-06-08 DIAGNOSIS — I872 Venous insufficiency (chronic) (peripheral): Secondary | ICD-10-CM | POA: Diagnosis present

## 2021-06-08 DIAGNOSIS — Z7982 Long term (current) use of aspirin: Secondary | ICD-10-CM

## 2021-06-08 DIAGNOSIS — Z881 Allergy status to other antibiotic agents status: Secondary | ICD-10-CM

## 2021-06-08 DIAGNOSIS — M7989 Other specified soft tissue disorders: Secondary | ICD-10-CM | POA: Diagnosis not present

## 2021-06-08 DIAGNOSIS — Z87442 Personal history of urinary calculi: Secondary | ICD-10-CM

## 2021-06-08 DIAGNOSIS — R5383 Other fatigue: Secondary | ICD-10-CM | POA: Diagnosis present

## 2021-06-08 DIAGNOSIS — Z20822 Contact with and (suspected) exposure to covid-19: Secondary | ICD-10-CM | POA: Diagnosis present

## 2021-06-08 DIAGNOSIS — Z8249 Family history of ischemic heart disease and other diseases of the circulatory system: Secondary | ICD-10-CM

## 2021-06-08 DIAGNOSIS — Z7901 Long term (current) use of anticoagulants: Secondary | ICD-10-CM

## 2021-06-08 DIAGNOSIS — Z96611 Presence of right artificial shoulder joint: Secondary | ICD-10-CM | POA: Diagnosis present

## 2021-06-08 DIAGNOSIS — I10 Essential (primary) hypertension: Secondary | ICD-10-CM | POA: Diagnosis present

## 2021-06-08 DIAGNOSIS — E782 Mixed hyperlipidemia: Secondary | ICD-10-CM | POA: Diagnosis present

## 2021-06-08 DIAGNOSIS — W19XXXA Unspecified fall, initial encounter: Secondary | ICD-10-CM | POA: Diagnosis present

## 2021-06-08 DIAGNOSIS — Z79899 Other long term (current) drug therapy: Secondary | ICD-10-CM

## 2021-06-08 DIAGNOSIS — Z91048 Other nonmedicinal substance allergy status: Secondary | ICD-10-CM

## 2021-06-08 DIAGNOSIS — E1165 Type 2 diabetes mellitus with hyperglycemia: Secondary | ICD-10-CM

## 2021-06-08 DIAGNOSIS — Z7984 Long term (current) use of oral hypoglycemic drugs: Secondary | ICD-10-CM

## 2021-06-08 DIAGNOSIS — E119 Type 2 diabetes mellitus without complications: Secondary | ICD-10-CM

## 2021-06-08 LAB — COMPREHENSIVE METABOLIC PANEL
ALT: 18 U/L (ref 0–44)
AST: 16 U/L (ref 15–41)
Albumin: 4 g/dL (ref 3.5–5.0)
Alkaline Phosphatase: 88 U/L (ref 38–126)
Anion gap: 7 (ref 5–15)
BUN: 15 mg/dL (ref 8–23)
CO2: 28 mmol/L (ref 22–32)
Calcium: 9.1 mg/dL (ref 8.9–10.3)
Chloride: 103 mmol/L (ref 98–111)
Creatinine, Ser: 0.98 mg/dL (ref 0.61–1.24)
GFR, Estimated: >60 mL/min (ref >60)
Glucose, Bld: 143 mg/dL — ABNORMAL HIGH (ref 70–99)
Potassium: 3.8 mmol/L (ref 3.5–5.1)
Sodium: 138 mmol/L (ref 135–145)
Total Bilirubin: 0.5 mg/dL (ref 0.3–1.2)
Total Protein: 7.3 g/dL (ref 6.5–8.1)

## 2021-06-08 LAB — RESP PANEL BY RT-PCR (FLU A&B, COVID) ARPGX2
Influenza A by PCR: NEGATIVE
Influenza B by PCR: NEGATIVE
SARS Coronavirus 2 by RT PCR: NEGATIVE

## 2021-06-08 LAB — CBC
HCT: 46.5 % (ref 39.0–52.0)
Hemoglobin: 14.7 g/dL (ref 13.0–17.0)
MCH: 28.9 pg (ref 26.0–34.0)
MCHC: 31.6 g/dL (ref 30.0–36.0)
MCV: 91.5 fL (ref 80.0–100.0)
Platelets: 327 10*3/uL (ref 150–400)
RBC: 5.08 MIL/uL (ref 4.22–5.81)
RDW: 14.5 % (ref 11.5–15.5)
WBC: 11.4 10*3/uL — ABNORMAL HIGH (ref 4.0–10.5)
nRBC: 0 % (ref 0.0–0.2)

## 2021-06-08 MED ORDER — SODIUM CHLORIDE 0.9 % IV SOLN
2.0000 g | Freq: Once | INTRAVENOUS | Status: AC
  Administered 2021-06-08: 2 g via INTRAVENOUS
  Filled 2021-06-08: qty 20

## 2021-06-08 NOTE — ED Provider Notes (Signed)
[facility] EMERGENCY DEPARTMENT Provider Note   CSN: [id number] Arrival date & time: 06/08/21  1719     History No chief complaint on file.   Kurt [name] is a 70 y.o. male.  Pt reports he was sent here by his MD.  Pt reports he saw his doctor and was told to come in.  Pt reports redness and swelling to both legs.  Pt states he has had cellulitis in the past but not this bad.    The history is provided by the patient. No language interpreter was used.  Leg Pain Location:  Leg Injury: no   Leg location:  L lower leg and R lower leg Pain details:    Quality:  Aching and burning   Radiates to:  Does not radiate   Severity:  Moderate   Timing:  Constant   Progression:  Worsening Chronicity:  New Dislocation: no   Relieved by:  Nothing Worsened by:  Nothing Ineffective treatments:  None tried Associated symptoms: swelling   Associated symptoms: no fever   Risk factors: known bone disorder   Risk factors: no recent illness       Past Medical History:  Diagnosis Date   Arthritis    Bronchitis    Bronchitis    Cataract    OU   Concussion    late 1990's  after a fall   GERD (gastroesophageal reflux disease)    History of kidney stones    Hypertension    Hypertensive retinopathy    OU   Osteogenesis imperfecta    PONV (postoperative nausea and vomiting)    pt has post polio syndrome   Post-polio syndrome    Type 2 diabetes mellitus (HCC) 12/25/2018    Patient Active Problem List   Diagnosis Date Noted   Cellulitis and abscess of leg 12/15/2020   Cellulitis of left lower extremity 12/14/2020   Hypertension    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   Cellulitis 02/24/2019   Post-polio syndrome 12/25/2018   Osteogenesis imperfecta 12/25/2018   Type 2 diabetes mellitus (HCC) 12/25/2018    Past Surgical History:  Procedure Laterality Date   ANKLE FRACTURE SURGERY Bilateral    arm surgery Right    nerve surgery   COLONOSCOPY     ELBOW FRACTURE SURGERY Left     EYE MUSCLE SURGERY Left    LEG SURGERY Left    femur fracture with rod   REVERSE SHOULDER ARTHROPLASTY Right 11/10/2019   Procedure: RIGHT REVERSE SHOULDER ARTHROPLASTY;  Surgeon: Cammy Copa, MD;  Location: [facility] OR;  Service: Orthopedics;  Laterality: Right;   TONSILLECTOMY         Family History  Problem Relation Age of Onset   Hypertension Mother    Diabetes Brother     Social History   Tobacco Use   Smoking status: Never   Smokeless tobacco: Never  Vaping Use   Vaping Use: Never used  Substance Use Topics   Alcohol use: Yes    Comment: 1 beer occasionally   Drug use: No    Home Medications Prior to Admission medications   Medication Sig Start Date End Date Taking? Authorizing Provider  acetaminophen (TYLENOL) 650 MG CR tablet Take 1,300 mg by mouth every 8 (eight) hours as needed for pain.    [provider]  apixaban (ELIQUIS) 5 MG TABS tablet Take 2 tablets (10mg ) twice daily for 7 days, then 1 tablet (5mg ) twice daily 12/29/20   , PA-C  aspirin  81 MG chewable tablet Chew 1 tablet (81 mg total) by mouth daily. 11/25/19   Magnant, Charles L, PA-C  glipiZIDE (GLUCOTROL XL) 10 MG 24 hr tablet Take 10 mg by mouth at bedtime.     [provider]  LANTUS SOLOSTAR 100 UNIT/ML Solostar Pen Inject 34 Units into the skin at bedtime. 12/19/18   [provider]  lisinopril (PRINIVIL,ZESTRIL) 20 MG tablet Take 20 mg by mouth daily.    [provider]  methocarbamol (ROBAXIN) 500 MG tablet Take 500 mg by mouth at bedtime as needed for muscle spasms. 07/02/19   [provider]  [facility] ULTRA test strip 1 each 2 (two) times daily. 10/05/20   [provider]  oxyCODONE (OXY IR/ROXICODONE) 5 MG immediate release tablet Take 1 tablet (5 mg total) by mouth every 8 (eight) hours as needed for moderate pain (pain score 4-6). Patient not taking: No sig reported 11/25/19   Magnant, Charles L, PA-C  simvastatin (ZOCOR) 20  MG tablet Take 20 mg by mouth every evening.    [provider]  SitaGLIPtin-MetFORMIN HCl [phone] MG TB24 Take 1 tablet by mouth at bedtime.    [provider]    Allergies    Augmentin [amoxicillin-pot clavulanate], Iodine, and Tape  Review of Systems   Review of Systems  Constitutional:  Negative for chills and fever.  Eyes:  Negative for pain.  Respiratory:  Negative for cough and shortness of breath.   Cardiovascular:  Positive for leg swelling.  All other systems reviewed and are negative.  Physical Exam Updated Vital Signs BP (!) 141/105 (BP Location: Right Arm)   Pulse (!) 105   Temp 99.9 F (37.7 C) (Oral)   Resp 18   SpO2 99%   Physical Exam Vitals and nursing note reviewed.  Constitutional:      Appearance: He is well-developed.  HENT:     Head: Normocephalic.  Cardiovascular:     Rate and Rhythm: Normal rate.  Pulmonary:     Effort: Pulmonary effort is normal.  Abdominal:     General: Abdomen is flat. There is no distension.  Musculoskeletal:        General: Swelling and tenderness present. Normal range of motion.     Cervical back: Normal range of motion.     Comments: Redness and swelling right leg to just below knee,  warm to touch,  open sore, left leg, redness to mid lower leg, warm to touch,    Skin:    General: Skin is warm.  Neurological:     General: No focal deficit present.     Mental Status: He is alert and oriented to person, place, and time.  Psychiatric:        Mood and Affect: Mood normal.    ED Results / Procedures / Treatments   Labs (all labs ordered are listed, but only abnormal results are displayed) Labs Reviewed  CBC - Abnormal; Notable for the following components:      Result Value   WBC 11.4 (*)    All other components within normal limits  COMPREHENSIVE METABOLIC PANEL - Abnormal; Notable for the following components:   Glucose, Bld 143 (*)    All other components within normal limits  CULTURE, BLOOD  (ROUTINE X 2)  CULTURE, BLOOD (ROUTINE X 2)  RESP PANEL BY RT-PCR (FLU A&B, COVID) [id number]    EKG None  Radiology No results found.  Procedures Procedures   Medications Ordered in ED Medications - No  data to display  ED Course  I have reviewed the triage vital signs and the nursing notes.  Pertinent labs & imaging results that were available during my care of the patient were reviewed by me and considered in my medical decision making (see chart for details).    MDM Rules/Calculators/A&P                          MDM:  IV  Rocephin ordered   Hospitalist consulted for admission  Final Clinical Impression(s) / ED Diagnoses Final diagnoses:  Cellulitis of both lower extremities    Rx / DC Orders ED Discharge Orders     None        Osie Cheeks 06/08/21 2221    Mancel Bale, MD 06/09/21 1036

## 2021-06-08 NOTE — ED Triage Notes (Signed)
LEFT LEG PAIN, POSSIBLE CELLULITIS

## 2021-06-09 ENCOUNTER — Encounter (HOSPITAL_COMMUNITY): Payer: Self-pay | Admitting: Family Medicine

## 2021-06-09 ENCOUNTER — Observation Stay (HOSPITAL_COMMUNITY): Payer: Medicare Other

## 2021-06-09 ENCOUNTER — Other Ambulatory Visit: Payer: Self-pay

## 2021-06-09 DIAGNOSIS — E782 Mixed hyperlipidemia: Secondary | ICD-10-CM | POA: Diagnosis present

## 2021-06-09 DIAGNOSIS — E785 Hyperlipidemia, unspecified: Secondary | ICD-10-CM | POA: Diagnosis present

## 2021-06-09 DIAGNOSIS — L03116 Cellulitis of left lower limb: Secondary | ICD-10-CM | POA: Diagnosis not present

## 2021-06-09 LAB — GLUCOSE, CAPILLARY
Glucose-Capillary: 152 mg/dL — ABNORMAL HIGH (ref 70–99)
Glucose-Capillary: 220 mg/dL — ABNORMAL HIGH (ref 70–99)
Glucose-Capillary: 153 mg/dL — ABNORMAL HIGH (ref 70–99)

## 2021-06-09 LAB — COMPREHENSIVE METABOLIC PANEL
ALT: 16 U/L (ref 0–44)
AST: 15 U/L (ref 15–41)
Albumin: 3.6 g/dL (ref 3.5–5.0)
Alkaline Phosphatase: 71 U/L (ref 38–126)
Anion gap: 8 (ref 5–15)
BUN: 13 mg/dL (ref 8–23)
CO2: 24 mmol/L (ref 22–32)
Calcium: 8.6 mg/dL — ABNORMAL LOW (ref 8.9–10.3)
Chloride: 106 mmol/L (ref 98–111)
Creatinine, Ser: 0.84 mg/dL (ref 0.61–1.24)
GFR, Estimated: >60 mL/min (ref >60)
Glucose, Bld: 121 mg/dL — ABNORMAL HIGH (ref 70–99)
Potassium: 3.9 mmol/L (ref 3.5–5.1)
Sodium: 138 mmol/L (ref 135–145)
Total Bilirubin: 0.6 mg/dL (ref 0.3–1.2)
Total Protein: 6.6 g/dL (ref 6.5–8.1)

## 2021-06-09 LAB — CBC
HCT: 44 % (ref 39.0–52.0)
Hemoglobin: 13.7 g/dL (ref 13.0–17.0)
MCH: 28.5 pg (ref 26.0–34.0)
MCHC: 31.1 g/dL (ref 30.0–36.0)
MCV: 91.5 fL (ref 80.0–100.0)
Platelets: 299 10*3/uL (ref 150–400)
RBC: 4.81 MIL/uL (ref 4.22–5.81)
RDW: 14.4 % (ref 11.5–15.5)
WBC: 10.8 10*3/uL — ABNORMAL HIGH (ref 4.0–10.5)
nRBC: 0 % (ref 0.0–0.2)

## 2021-06-09 LAB — HEMOGLOBIN A1C
Hgb A1c MFr Bld: 7.6 % — ABNORMAL HIGH (ref 4.8–5.6)
Mean Plasma Glucose: 171.42 mg/dL

## 2021-06-09 LAB — CBG MONITORING, ED: Glucose-Capillary: 123 mg/dL — ABNORMAL HIGH (ref 70–99)

## 2021-06-09 LAB — MAGNESIUM: Magnesium: 1.9 mg/dL (ref 1.7–2.4)

## 2021-06-09 MED ORDER — VANCOMYCIN HCL IN DEXTROSE 1-5 GM/200ML-% IV SOLN
1000.0000 mg | Freq: Once | INTRAVENOUS | Status: DC
  Filled 2021-06-09: qty 200

## 2021-06-09 MED ORDER — SODIUM CHLORIDE 0.9 % IV SOLN
2.0000 g | Freq: Three times a day (TID) | INTRAVENOUS | Status: DC
  Administered 2021-06-09 – 2021-06-12 (×8): 2 g via INTRAVENOUS
  Filled 2021-06-09 (×8): qty 2

## 2021-06-09 MED ORDER — VANCOMYCIN HCL 2000 MG/400ML IV SOLN
2000.0000 mg | Freq: Once | INTRAVENOUS | Status: AC
  Administered 2021-06-09: 2000 mg via INTRAVENOUS
  Filled 2021-06-09: qty 400

## 2021-06-09 MED ORDER — INSULIN ASPART 100 UNIT/ML IJ SOLN
0.0000 [IU] | Freq: Three times a day (TID) | INTRAMUSCULAR | Status: DC
  Administered 2021-06-09: 3 [IU] via SUBCUTANEOUS
  Administered 2021-06-09: 5 [IU] via SUBCUTANEOUS
  Administered 2021-06-10: 3 [IU] via SUBCUTANEOUS
  Administered 2021-06-10: 8 [IU] via SUBCUTANEOUS
  Administered 2021-06-10: 5 [IU] via SUBCUTANEOUS
  Administered 2021-06-11 (×2): 3 [IU] via SUBCUTANEOUS
  Administered 2021-06-11: 11 [IU] via SUBCUTANEOUS
  Administered 2021-06-12: 8 [IU] via SUBCUTANEOUS
  Administered 2021-06-12: 3 [IU] via SUBCUTANEOUS
  Filled 2021-06-09: qty 1

## 2021-06-09 MED ORDER — ONDANSETRON HCL 4 MG/2ML IJ SOLN: 4.0000 mg | Freq: Four times a day (QID) | INTRAMUSCULAR | Status: DC | PRN

## 2021-06-09 MED ORDER — HYDROCODONE-ACETAMINOPHEN 7.5-325 MG PO TABS
1.0000 | ORAL_TABLET | Freq: Four times a day (QID) | ORAL | Status: DC | PRN
Start: 2021-06-09 — End: 2021-06-09

## 2021-06-09 MED ORDER — VANCOMYCIN HCL IN DEXTROSE 1-5 GM/200ML-% IV SOLN
1000.0000 mg | Freq: Two times a day (BID) | INTRAVENOUS | Status: DC
  Administered 2021-06-10 – 2021-06-12 (×5): 1000 mg via INTRAVENOUS
  Filled 2021-06-09 (×5): qty 200

## 2021-06-09 MED ORDER — ONDANSETRON HCL 4 MG PO TABS: 4.0000 mg | ORAL_TABLET | Freq: Four times a day (QID) | ORAL | Status: DC | PRN

## 2021-06-09 MED ORDER — POLYETHYLENE GLYCOL 3350 17 G PO PACK: 17.0000 g | PACK | Freq: Every day | ORAL | Status: DC | PRN

## 2021-06-09 MED ORDER — LISINOPRIL 10 MG PO TABS
20.0000 mg | ORAL_TABLET | Freq: Every day | ORAL | Status: DC
  Administered 2021-06-09 – 2021-06-12 (×4): 20 mg via ORAL
  Filled 2021-06-09 (×4): qty 2

## 2021-06-09 MED ORDER — ACETAMINOPHEN 650 MG RE SUPP: 650.0000 mg | Freq: Four times a day (QID) | RECTAL | Status: DC | PRN

## 2021-06-09 MED ORDER — ASPIRIN 81 MG PO CHEW
81.0000 mg | CHEWABLE_TABLET | Freq: Every day | ORAL | Status: DC
  Administered 2021-06-09 – 2021-06-12 (×4): 81 mg via ORAL
  Filled 2021-06-09 (×4): qty 1

## 2021-06-09 MED ORDER — INSULIN GLARGINE 100 UNIT/ML ~~LOC~~ SOLN
34.0000 [IU] | Freq: Every day | SUBCUTANEOUS | Status: DC
  Administered 2021-06-09 – 2021-06-11 (×3): 34 [IU] via SUBCUTANEOUS
  Filled 2021-06-09 (×4): qty 0.34

## 2021-06-09 MED ORDER — SODIUM CHLORIDE 0.9 % IV SOLN: 1.0000 g | INTRAVENOUS | Status: DC

## 2021-06-09 MED ORDER — ACETAMINOPHEN 325 MG PO TABS
650.0000 mg | ORAL_TABLET | Freq: Four times a day (QID) | ORAL | Status: DC | PRN
Start: 2021-06-09 — End: 2021-06-12

## 2021-06-09 MED ORDER — HYDROCODONE-ACETAMINOPHEN 5-325 MG PO TABS
1.0000 | ORAL_TABLET | ORAL | Status: DC | PRN
Start: 2021-06-09 — End: 2021-06-12
  Administered 2021-06-09 – 2021-06-11 (×6): 1 via ORAL
  Filled 2021-06-09 (×6): qty 1

## 2021-06-09 MED ORDER — APIXABAN 5 MG PO TABS
5.0000 mg | ORAL_TABLET | Freq: Two times a day (BID) | ORAL | Status: DC
  Administered 2021-06-09 – 2021-06-12 (×7): 5 mg via ORAL
  Filled 2021-06-09 (×8): qty 1

## 2021-06-09 MED ORDER — SODIUM CHLORIDE 0.9 % IV SOLN: 2.0000 g | INTRAVENOUS | Status: DC

## 2021-06-09 MED ORDER — SIMVASTATIN 20 MG PO TABS
20.0000 mg | ORAL_TABLET | Freq: Every evening | ORAL | Status: DC
  Administered 2021-06-09 – 2021-06-11 (×3): 20 mg via ORAL
  Filled 2021-06-09 (×3): qty 1

## 2021-06-09 MED ORDER — SODIUM CHLORIDE 0.9 % IV SOLN
2.0000 g | Freq: Once | INTRAVENOUS | Status: AC
  Administered 2021-06-09: 2 g via INTRAVENOUS
  Filled 2021-06-09: qty 2

## 2021-06-09 MED ORDER — INSULIN ASPART 100 UNIT/ML IJ SOLN
0.0000 [IU] | Freq: Every day | INTRAMUSCULAR | Status: DC
  Administered 2021-06-10: 3 [IU] via SUBCUTANEOUS

## 2021-06-09 NOTE — H&P (Signed)
TRH H&P    Patient Demographics:    Kurt [name], is a 70 y.o. male  MRN: [id number]  DOB - 1951-01-11  Admit Date - 06/08/2021  Referring MD/NP/PA: Keenan Bachelor  Outpatient Primary MD for the patient is [facility]  Patient coming from: Home  Chief complaint- Skin infection   HPI:    [name]  is a 70 y.o. male, with history of postpolio syndrome, type 2 diabetes mellitus, hypertension, GERD and more presents the ED with a chief complaint of skin infection.  Patient reports that symptoms have been ongoing for 2 days.  He noticed blistering on his leg and he tried to put Neosporin on it which helped, but then the blisters ruptured releasing watery clear fluid.  He reports that it started hurting with burning and deep pain.  He has never had pain there from the venous stasis changes, so he was concerned about that.  He saw Dr. Today who told him to come into the ED.  He reports some bleeding when he would try to scratch it with a towel.  He reports no purulent drainage.  He has not had any fevers, nausea vomiting.  He does report that he has had fatigue, but that sometimes normal for him.  He initially started having trouble with this lower extremity when he fell through the floor in spring 2020.  He has had several bouts of cellulitis since then with his last 1 being a 6 months ago.  Patient is a diabetic and has some venous insufficiency so the light does not ever fully heal.  Patient has no other complaints at this time.  Patient does not smoke, does not drink alcohol, does not use illicit drugs.  He is vaccinated for COVID with booster, and he is DNR.  In the ED Temp 99.9, heart rate 88-1 05, respiratory rate 18-20, blood pressure 139/102, satting at 100% Slight leukocytosis with a white blood cell count of 11.4, hemoglobin 14.7 Chemistry panel is unremarkable Respiratory panel is  negative Blood cultures pending Rocephin started in the ED Admission requested for further management of cellulitis.  Patient likely to dispo within 24 hours.    Review of systems:    In addition to the HPI above,  No Fever-chills, No Headache, No changes with Vision or hearing, No problems swallowing food or Liquids, No Chest pain, Cough or Shortness of Breath, No Abdominal pain, No Nausea or Vomiting, bowel movements are regular, No Blood in stool or Urine, No dysuria, No new skin rashes or bruises, No new joints pains-aches,  No new new weakness, tingling, numbness in any extremity, No recent weight gain or loss, No polyuria, polydypsia or polyphagia, No significant Mental Stressors.  All other systems reviewed and are negative.    Past History of the following :    Past Medical History:  Diagnosis Date   Arthritis    Bronchitis    Bronchitis    Cataract    OU   Concussion    late 1990's  after a fall  GERD (gastroesophageal reflux disease)    History of kidney stones    Hypertension    Hypertensive retinopathy    OU   Osteogenesis imperfecta    PONV (postoperative nausea and vomiting)    pt has post polio syndrome   Post-polio syndrome    Type 2 diabetes mellitus (HCC) 12/25/2018      Past Surgical History:  Procedure Laterality Date   ANKLE FRACTURE SURGERY Bilateral    arm surgery Right    nerve surgery   COLONOSCOPY     ELBOW FRACTURE SURGERY Left    EYE MUSCLE SURGERY Left    LEG SURGERY Left    femur fracture with rod   REVERSE SHOULDER ARTHROPLASTY Right 11/10/2019   Procedure: RIGHT REVERSE SHOULDER ARTHROPLASTY;  Surgeon: Cammy Copaean, Gregory Scott, MD;  Location: [facility] OR;  Service: Orthopedics;  Laterality: Right;   TONSILLECTOMY        Social History:      Social History   Tobacco Use   Smoking status: Never   Smokeless tobacco: Never  Substance Use Topics   Alcohol use: Yes    Comment: 1 beer occasionally       Family History :      Family History  Problem Relation Age of Onset   Hypertension Mother    Diabetes Brother       Home Medications:   Prior to Admission medications   Medication Sig Start Date End Date Taking? Authorizing Provider  acetaminophen (TYLENOL) 650 MG CR tablet Take 1,300 mg by mouth every 8 (eight) hours as needed for pain.    [provider]  apixaban (ELIQUIS) 5 MG TABS tablet Take 2 tablets (10mg ) twice daily for 7 days, then 1 tablet (5mg ) twice daily 12/29/20   Elson AreasSofia, Leslie K, PA-C  aspirin 81 MG chewable tablet Chew 1 tablet (81 mg total) by mouth daily. 11/25/19   Magnant, Charles L, PA-C  glipiZIDE (GLUCOTROL XL) 10 MG 24 hr tablet Take 10 mg by mouth at bedtime.     [provider]  LANTUS SOLOSTAR 100 UNIT/ML Solostar Pen Inject 34 Units into the skin at bedtime. 12/19/18   [provider]  lisinopril (PRINIVIL,ZESTRIL) 20 MG tablet Take 20 mg by mouth daily.    [provider]  methocarbamol (ROBAXIN) 500 MG tablet Take 500 mg by mouth at bedtime as needed for muscle spasms. 07/02/19   [provider]  [facility] test strip 1 each 2 (two) times daily. 10/05/20   [provider]  oxyCODONE (OXY IR/ROXICODONE) 5 MG immediate release tablet Take 1 tablet (5 mg total) by mouth every 8 (eight) hours as needed for moderate pain (pain score 4-6). Patient not taking: No sig reported 11/25/19   Magnant, Charles L, PA-C  simvastatin (ZOCOR) 20 MG tablet Take 20 mg by mouth every evening.    [provider]  SitaGLIPtin-MetFORMIN HCl [id number] MG TB24 Take 1 tablet by mouth at bedtime.    [provider]     Allergies:     Allergies  Allergen Reactions   Augmentin [Amoxicillin-Pot Clavulanate] Nausea And Vomiting   Iodine Hives   Tape Rash    Paper      Physical Exam:   Vitals  Blood pressure (!) 147/92, pulse 91, temperature 99.9 F (37.7 C), temperature source Oral, resp. rate 20, SpO2 99 %.  1.   General: Patient lying supine in bed,  no acute distress   2. Psychiatric: Alert and oriented x 3, mood  and behavior normal for situation, pleasant and cooperative with exam   3. Neurologic: Speech and language are normal, face is symmetric, moves all 4 extremities voluntarily, at baseline without acute deficits on limited exam   4. HEENMT:  Head is atraumatic, normocephalic, pupils reactive to light, neck is supple, trachea is midline, mucous membranes are moist   5. Respiratory : Lungs are clear to auscultation bilaterally without wheezing, rhonchi, rales, no cyanosis, no increase in work of breathing or accessory muscle use   6. Cardiovascular : Heart rate normal, rhythm is regular, no murmurs, rubs or gallops, peripheral edema present, peripheral pulses palpated   7. Gastrointestinal:  Abdomen is soft, nondistended, nontender to palpation bowel sounds active, no masses or organomegaly palpated   8. Skin:  Significant erythema of the left lower extremity with tenderness to palpation, wound is draining serous fluid, edema present  9.Musculoskeletal:  No acute deformities or trauma, no asymmetry in tone, wounds and cellulitis as described above, venous stasis changes of the right lower extremity    Data Review:    CBC Recent Labs  Lab 06/08/21 2045  WBC 11.4*  HGB 14.7  HCT 46.5  PLT 327  MCV 91.5  MCH 28.9  MCHC 31.6  RDW 14.5   ------------------------------------------------------------------------------------------------------------------  Results for orders placed or performed during the hospital encounter of 06/08/21 (from the past 48 hour(s))  CBC     Status: Abnormal   Collection Time: 06/08/21  8:45 PM  Result Value Ref Range   WBC 11.4 (H) 4.0 - 10.5 K/uL   RBC 5.08 4.22 - 5.81 MIL/uL   Hemoglobin 14.7 13.0 - 17.0 g/dL   HCT 37.0 48.8 - 89.1 %   MCV 91.5 80.0 - 100.0 fL   MCH 28.9 26.0 - 34.0 pg   MCHC 31.6 30.0 - 36.0 g/dL   RDW 69.4 50.3 - 88.8 %    Platelets 327 150 - 400 K/uL   nRBC 0.0 0.0 - 0.2 %    Comment: Performed at [facility], [address] [place], [place] [address]  Comprehensive metabolic panel     Status: Abnormal   Collection Time: 06/08/21  8:45 PM  Result Value Ref Range   Sodium 138 135 - 145 mmol/L   Potassium 3.8 3.5 - 5.1 mmol/L   Chloride 103 98 - 111 mmol/L   CO2 28 22 - 32 mmol/L   Glucose, Bld 143 (H) 70 - 99 mg/dL    Comment: Glucose reference range applies only to samples taken after fasting for at least 8 hours.   BUN 15 8 - 23 mg/dL   Creatinine, Ser 4.91 0.61 - 1.24 mg/dL   Calcium 9.1 8.9 - 79.1 mg/dL   Total Protein 7.3 6.5 - 8.1 g/dL   Albumin 4.0 3.5 - 5.0 g/dL   AST 16 15 - 41 U/L   ALT 18 0 - 44 U/L   Alkaline Phosphatase 88 38 - 126 U/L   Total Bilirubin 0.5 0.3 - 1.2 mg/dL   GFR, Estimated >50 >56 mL/min    Comment: (NOTE) Calculated using the CKD-EPI Creatinine Equation (2021)    Anion gap 7 5 - 15    Comment: Performed at [facility], [address] [place], [place] [address]  Resp Panel by RT-PCR (Flu A&B, Covid) Nasopharyngeal Swab     Status: None   Collection Time: 06/08/21  9:43 PM   Specimen: Nasopharyngeal Swab; Nasopharyngeal(NP) swabs in vial transport medium  Result Value Ref Range   SARS  Coronavirus 2 by RT PCR NEGATIVE NEGATIVE    Comment: (NOTE) SARS-CoV-2 target nucleic acids are NOT DETECTED.  The SARS-CoV-2 RNA is generally detectable in upper respiratory specimens during the acute phase of infection. The lowest concentration of SARS-CoV-2 viral copies this assay can detect is 138 copies/mL. A negative result does not preclude SARS-Cov-2 infection and should not be used as the sole basis for treatment or other patient management decisions. A negative result may occur with  improper specimen collection/handling, submission of specimen other than nasopharyngeal swab, presence of viral mutation(s) within the areas targeted by this assay, and inadequate number  of viral copies(<138 copies/mL). A negative result must be combined with clinical observations, patient history, and epidemiological information. The expected result is Negative.  Fact Sheet for Patients:  BloggerCourse.com  Fact Sheet for Healthcare Providers:  SeriousBroker.it  This test is no t yet approved or cleared by the [place] FDA and  has been authorized for detection and/or diagnosis of SARS-CoV-2 by FDA under an Emergency Use Authorization (EUA). This EUA will remain  in effect (meaning this test can be used) for the duration of the COVID-19 declaration under Section 564(b)(1) of the Act, 21 U.S.C.section 360bbb-3(b)(1), unless the authorization is terminated  or revoked sooner.       Influenza A by PCR NEGATIVE NEGATIVE   Influenza B by PCR NEGATIVE NEGATIVE    Comment: (NOTE) The Xpert Xpress SARS-CoV-2/FLU/RSV plus assay is intended as an aid in the diagnosis of influenza from Nasopharyngeal swab specimens and should not be used as a sole basis for treatment. Nasal washings and aspirates are unacceptable for Xpert Xpress SARS-CoV-2/FLU/RSV testing.  Fact Sheet for Patients: BloggerCourse.com  Fact Sheet for Healthcare Providers: SeriousBroker.it  This test is not yet approved or cleared by the [place] FDA and has been authorized for detection and/or diagnosis of SARS-CoV-2 by FDA under an Emergency Use Authorization (EUA). This EUA will remain in effect (meaning this test can be used) for the duration of the COVID-19 declaration under Section 564(b)(1) of the Act, 21 U.S.C. section 360bbb-3(b)(1), unless the authorization is terminated or revoked.  Performed at [facility], [address] [place], [place] [address]   Blood culture (routine x 2)     Status: None (Preliminary result)   Collection Time: 06/08/21  9:44 PM   Specimen: BLOOD  Result  Value Ref Range   Specimen Description BLOOD LEFT ANTECUBITAL    Special Requests      Blood Culture adequate volume BOTTLES DRAWN AEROBIC AND ANAEROBIC Performed at [facility], [address] [place], [place] [address]    Culture PENDING    Report Status PENDING   Blood culture (routine x 2)     Status: None (Preliminary result)   Collection Time: 06/08/21  9:54 PM   Specimen: BLOOD  Result Value Ref Range   Specimen Description BLOOD BLOOD LEFT HAND    Special Requests      Blood Culture adequate volume BOTTLES DRAWN AEROBIC AND ANAEROBIC Performed at [facility], [address] [place], [place] [address]    Culture PENDING    Report Status PENDING     Chemistries  Recent Labs  Lab 06/08/21 2045  NA 138  K 3.8  CL 103  CO2 28  GLUCOSE 143*  BUN 15  CREATININE 0.98  CALCIUM 9.1  AST 16  ALT 18  ALKPHOS 88  BILITOT 0.5   ------------------------------------------------------------------------------------------------------------------  ------------------------------------------------------------------------------------------------------------------ GFR: CrCl cannot be calculated (Unknown ideal weight.). Liver  Function Tests: Recent Labs  Lab 06/08/21 2045  AST 16  ALT 18  ALKPHOS 88  BILITOT 0.5  PROT 7.3  ALBUMIN 4.0   No results for input(s): LIPASE, AMYLASE in the last 168 hours. No results for input(s): AMMONIA in the last 168 hours. Coagulation Profile: No results for input(s): INR, PROTIME in the last 168 hours. Cardiac Enzymes: No results for input(s): CKTOTAL, CKMB, CKMBINDEX, TROPONINI in the last 168 hours. BNP (last 3 results) No results for input(s): PROBNP in the last 8760 hours. HbA1C: No results for input(s): HGBA1C in the last 72 hours. CBG: No results for input(s): GLUCAP in the last 168 hours. Lipid Profile: No results for input(s): CHOL, HDL, LDLCALC, TRIG, CHOLHDL, LDLDIRECT in the last 72 hours. Thyroid Function Tests: No  results for input(s): TSH, T4TOTAL, FREET4, T3FREE, THYROIDAB in the last 72 hours. Anemia Panel: No results for input(s): VITAMINB12, FOLATE, FERRITIN, TIBC, IRON, RETICCTPCT in the last 72 hours.  --------------------------------------------------------------------------------------------------------------- Urine analysis:    Component Value Date/Time   COLORURINE YELLOW 12/13/2020 1835   APPEARANCEUR CLEAR 12/13/2020 1835   LABSPEC 1.010 12/13/2020 1835   PHURINE 5.0 12/13/2020 1835   GLUCOSEU >=500 (A) 12/13/2020 1835   HGBUR NEGATIVE 12/13/2020 1835   BILIRUBINUR NEGATIVE 12/13/2020 1835   KETONESUR NEGATIVE 12/13/2020 1835   PROTEINUR NEGATIVE 12/13/2020 1835   NITRITE NEGATIVE 12/13/2020 1835   LEUKOCYTESUR NEGATIVE 12/13/2020 1835      Imaging Results:    No results found.    Assessment & Plan:    Principal Problem:   Cellulitis of left lower extremity Active Problems:   Type 2 diabetes mellitus (HCC)   Cellulitis   Hypertension   HLD (hyperlipidemia)   Cellulitis Cellulitis of left lower extremity Rocephin ordered per cellulitis order set for mod non purulent disease Slight leukocytosis 11.4 Blood cultures pending Continue to monitor T2DM Continue lantus Sliding scale coverage Monitor CBGs Carb modified diet HTN Continue lisinopril BP 147/92 at admission Continue to monitor HLD Continue statin    DVT Prophylaxis-   Eliquis - SCDs   AM Labs Ordered, also please review Full Orders  Family Communication: No family at bedside  Code Status:  DNR  Admission status: Observation  Time spent in minutes : 65   Stephenson Cichy B Zierle-Ghosh DO

## 2021-06-09 NOTE — Consult Note (Signed)
WOC Nurse wound consult note  Consultation completed by use of remote telehealth camera cart/Elink technology and with assistance from bedside nurse/clinical staff  Reason for Consult: LE sores/cellulitis Apparently per patient ongoing patient for several weeks.  Self reports he wears compression stockings that were ordered (he was measured for) but has not been able to get them on for several weeks.  In which time ulcerations have appeared.   Presents with what appears to be classic venous stasis, however he can not recall having an ABI to test his arterial blood flow.   Patient self reports fractures of both ankles at some point as well, we discussed possible lymphedema component as well s/p fracture and repair with surgical hardware.  In order to safely compress this patient with therapeutic compression will need an ABI.  Wound type: venous stasis with underlying cellulitis  Pressure Injury POA: NA Measurement:large ulcerations of the right medial and left medial malleolar region Wound bed: 90% fibrinous material/10% pink Drainage (amount, consistency, odor) serous weeping per patient, non purulent Periwound: erythema and edema bilaterally  Dressing procedure/placement/frequency:   Silver hydrofiber to the open ulcerations, top with dry dressing, secure with conform or kerlix wrapped from the base of the first metatarsal head to the pattellar notch . 4" Coban with minimal stretch also from the toes to the patellar notch Obtain ABI if MD agrees; if results greater than 0.8 bilateral proceed with therapeutic compression. Topical wound care the same; Unna's boots bilaterally. Change 2x wk Please note no WOC nurse on the [facility] over the weekend; would need competent bedside nurse on the [facility] to apply once ABI resulted.   If no bedside nurse with training/competency for Unna's boots then would continue current POC (topical care and mild compression with Coban) and plan follow up in a  wound care center of the patient's choice for long term management of venous stasis dx. Ultimately the goal would be to transition from Unna's boots back to the patient's compression stockings however he can not do that until the cellulitis and ulcerations have healed and the edema is in better control.   Will follow remotely for outcome of ABI results if MD agrees with plan.  If patient still inpatient Monday WOC nurse can attempt placement if staffing permits.   Discussed all of the above with the patient and the bedside RN in the ED.  Notified MD by secure chat   Discussed POC with patient and bedside nurse.  Re consult if needed, will not follow at this time. Thanks  Myleah Cavendish [employer], RN,CWOCN, CNS, CWON-[facility] [phone])

## 2021-06-09 NOTE — Consult Note (Signed)
WOC consultation completed earlier today ABI resulted: right ABI 0.72; left 1.09. Mild PAD not critically low   Discussed with Dr. Laural Benes will proceed with Profore compression WTA working in the hospital today can place, will leave current topical care in place.   Will need HHRN and/ or follow up in a wound care center of his choice to manage compression wraps until ulcerations are improved and edema under control.  Consulted TOC for this need.   Dr. Laural Benes aware of needs for DC plans.   Contacted patient by phone in his room to discuss updated plan of care. Patient verbalized understanding.   Loletta Specter RN, WTA from the ICU will come to place wraps for patient sometime this shift.   Alliene Klugh [facility], CNS, [employer] [phone]

## 2021-06-09 NOTE — Progress Notes (Signed)
Pharmacy Antibiotic Note  Kurt [name] is a 70 y.o. male admitted on 06/08/2021 with cellulitis.  Pharmacy has been consulted for Vancomycin and cefepime dosing.  Plan: Vancomycin 2gm IV loading dose, then 1gm IV Q12h for AUC 432. Cefepime 2gm IV q8h F/U cxs and clinical progress Monitor V/S, labs and levels as indicated  Height: 5\' 5"  (165.1 cm) Weight: 94.7 kg (208 lb 12.4 oz) IBW/kg (Calculated) : 61.5  Temp (24hrs), Avg:98.6 F (37 C), Min:98.1 F (36.7 C), Max:99.9 F (37.7 C)  Recent Labs  Lab 06/08/21 2045 06/09/21 0335  WBC 11.4* 10.8*  CREATININE 0.98 0.84    Estimated Creatinine Clearance: 85.3 mL/min (by C-G formula based on SCr of 0.84 mg/dL).    Allergies  Allergen Reactions   Augmentin [Amoxicillin-Pot Clavulanate] Nausea And Vomiting   Iodine Hives   Tape Rash    Paper     Antimicrobials this admission: Cefepime 6/24  >>  Vancomycin 6/24 >>   Microbiology results: 6/24 BCx: pending  Thank you for allowing pharmacy to be a part of this patient's care.  7/24, BS Elder Cyphers, Loura Back Clinical Pharmacist Pager [phone] 06/09/2021 2:38 PM

## 2021-06-09 NOTE — ED Notes (Signed)
novolog not in pyxis. Pt due for 2 units. Pharmacy notified

## 2021-06-09 NOTE — Progress Notes (Signed)
ASSUMPTION OF CARE NOTE   06/09/2021 4:29 PM  Kurt [name] was seen and examined.  The H&P by the admitting provider, orders, imaging was reviewed.  Please see new orders.  Will continue to follow.  Pt has significant bilateral LE cellulitis. Antibiotics changed over.   Vitals:   06/09/21 1238 06/09/21 1435  BP: (!) 142/91 129/79  Pulse: 86 84  Resp: 17 18  Temp: 98.2 F (36.8 C) 98.1 F (36.7 C)  SpO2: 100% 100%    Results for orders placed or performed during the hospital encounter of 06/08/21  Blood culture (routine x 2)   Specimen: BLOOD  Result Value Ref Range   Specimen Description BLOOD LEFT ANTECUBITAL    Special Requests      Blood Culture adequate volume BOTTLES DRAWN AEROBIC AND ANAEROBIC   Culture      NO GROWTH < 12 HOURS Performed at [facility], [address], [place], [place] [address]    Report Status PENDING   Blood culture (routine x 2)   Specimen: BLOOD  Result Value Ref Range   Specimen Description BLOOD BLOOD LEFT HAND    Special Requests      Blood Culture adequate volume BOTTLES DRAWN AEROBIC AND ANAEROBIC   Culture      NO GROWTH < 12 HOURS Performed at [facility], [address] [place], [place] [address]    Report Status PENDING   Resp Panel by RT-PCR (Flu A&B, Covid) Nasopharyngeal Swab   Specimen: Nasopharyngeal Swab; Nasopharyngeal(NP) swabs in vial transport medium  Result Value Ref Range   SARS Coronavirus 2 by RT PCR NEGATIVE NEGATIVE   Influenza A by PCR NEGATIVE NEGATIVE   Influenza B by PCR NEGATIVE NEGATIVE  CBC  Result Value Ref Range   WBC 11.4 (H) 4.0 - 10.5 K/uL   RBC 5.08 4.22 - 5.81 MIL/uL   Hemoglobin 14.7 13.0 - 17.0 g/dL   HCT 70.9 62.8 - 36.6 %   MCV 91.5 80.0 - 100.0 fL   MCH 28.9 26.0 - 34.0 pg   MCHC 31.6 30.0 - 36.0 g/dL   RDW 29.4 76.5 - 46.5 %   Platelets 327 150 - 400 K/uL   nRBC 0.0 0.0 - 0.2 %  Comprehensive metabolic panel  Result Value Ref Range   Sodium 138 135 - 145 mmol/L   Potassium 3.8  3.5 - 5.1 mmol/L   Chloride 103 98 - 111 mmol/L   CO2 28 22 - 32 mmol/L   Glucose, Bld 143 (H) 70 - 99 mg/dL   BUN 15 8 - 23 mg/dL   Creatinine, Ser 0.35 0.61 - 1.24 mg/dL   Calcium 9.1 8.9 - 46.5 mg/dL   Total Protein 7.3 6.5 - 8.1 g/dL   Albumin 4.0 3.5 - 5.0 g/dL   AST 16 15 - 41 U/L   ALT 18 0 - 44 U/L   Alkaline Phosphatase 88 38 - 126 U/L   Total Bilirubin 0.5 0.3 - 1.2 mg/dL   GFR, Estimated >68 >12 mL/min   Anion gap 7 5 - 15  Magnesium  Result Value Ref Range   Magnesium 1.9 1.7 - 2.4 mg/dL  Comprehensive metabolic panel  Result Value Ref Range   Sodium 138 135 - 145 mmol/L   Potassium 3.9 3.5 - 5.1 mmol/L   Chloride 106 98 - 111 mmol/L   CO2 24 22 - 32 mmol/L   Glucose, Bld 121 (H) 70 - 99 mg/dL   BUN 13 8 - 23  mg/dL   Creatinine, Ser 1.32 0.61 - 1.24 mg/dL   Calcium 8.6 (L) 8.9 - 10.3 mg/dL   Total Protein 6.6 6.5 - 8.1 g/dL   Albumin 3.6 3.5 - 5.0 g/dL   AST 15 15 - 41 U/L   ALT 16 0 - 44 U/L   Alkaline Phosphatase 71 38 - 126 U/L   Total Bilirubin 0.6 0.3 - 1.2 mg/dL   GFR, Estimated >44 >01 mL/min   Anion gap 8 5 - 15  CBC  Result Value Ref Range   WBC 10.8 (H) 4.0 - 10.5 K/uL   RBC 4.81 4.22 - 5.81 MIL/uL   Hemoglobin 13.7 13.0 - 17.0 g/dL   HCT 02.7 25.3 - 66.4 %   MCV 91.5 80.0 - 100.0 fL   MCH 28.5 26.0 - 34.0 pg   MCHC 31.1 30.0 - 36.0 g/dL   RDW 40.3 47.4 - 25.9 %   Platelets 299 150 - 400 K/uL   nRBC 0.0 0.0 - 0.2 %  Hemoglobin A1c  Result Value Ref Range   Hgb A1c MFr Bld 7.6 (H) 4.8 - 5.6 %   Mean Plasma Glucose 171.42 mg/dL  Glucose, capillary  Result Value Ref Range   Glucose-Capillary 152 (H) 70 - 99 mg/dL  Glucose, capillary  Result Value Ref Range   Glucose-Capillary 220 (H) 70 - 99 mg/dL   Comment 1 Notify RN    Comment 2 Document in Chart   CBG monitoring, ED  Result Value Ref Range   Glucose-Capillary 123 (H) 70 - 99 mg/dL   Maryln Manuel, MD [facility]   06/08/2021  7:04 PM How to contact the [facility] Attending or  Consulting provider 7A - 7P or covering provider during after hours 7P -7A, for this patient?  Check the care team in [facility] and look for a) attending/consulting TRH provider listed and b) the [facility] team listed Log into [email or web address] and use [place]'s universal password to access. If you do not have the password, please contact the hospital operator. Locate the [facility] provider you are looking for under [facility] and page to a number that you can be directly reached. If you still have difficulty reaching the provider, please page the [facility] (Director on Call) for the Hospitalists listed on amion for assistance.

## 2021-06-10 DIAGNOSIS — Z79899 Other long term (current) drug therapy: Secondary | ICD-10-CM | POA: Diagnosis not present

## 2021-06-10 DIAGNOSIS — L03116 Cellulitis of left lower limb: Secondary | ICD-10-CM | POA: Diagnosis present

## 2021-06-10 DIAGNOSIS — K219 Gastro-esophageal reflux disease without esophagitis: Secondary | ICD-10-CM | POA: Diagnosis present

## 2021-06-10 DIAGNOSIS — L03115 Cellulitis of right lower limb: Secondary | ICD-10-CM | POA: Diagnosis present

## 2021-06-10 DIAGNOSIS — Z888 Allergy status to other drugs, medicaments and biological substances status: Secondary | ICD-10-CM | POA: Diagnosis not present

## 2021-06-10 DIAGNOSIS — Z881 Allergy status to other antibiotic agents status: Secondary | ICD-10-CM | POA: Diagnosis not present

## 2021-06-10 DIAGNOSIS — I872 Venous insufficiency (chronic) (peripheral): Secondary | ICD-10-CM | POA: Diagnosis present

## 2021-06-10 DIAGNOSIS — E785 Hyperlipidemia, unspecified: Secondary | ICD-10-CM | POA: Diagnosis not present

## 2021-06-10 DIAGNOSIS — Z833 Family history of diabetes mellitus: Secondary | ICD-10-CM | POA: Diagnosis not present

## 2021-06-10 DIAGNOSIS — Z880 Allergy status to penicillin: Secondary | ICD-10-CM | POA: Diagnosis not present

## 2021-06-10 DIAGNOSIS — Z8249 Family history of ischemic heart disease and other diseases of the circulatory system: Secondary | ICD-10-CM | POA: Diagnosis not present

## 2021-06-10 DIAGNOSIS — I878 Other specified disorders of veins: Secondary | ICD-10-CM | POA: Diagnosis present

## 2021-06-10 DIAGNOSIS — Z794 Long term (current) use of insulin: Secondary | ICD-10-CM | POA: Diagnosis not present

## 2021-06-10 DIAGNOSIS — E119 Type 2 diabetes mellitus without complications: Secondary | ICD-10-CM | POA: Diagnosis present

## 2021-06-10 DIAGNOSIS — I10 Essential (primary) hypertension: Secondary | ICD-10-CM

## 2021-06-10 DIAGNOSIS — Z7982 Long term (current) use of aspirin: Secondary | ICD-10-CM | POA: Diagnosis not present

## 2021-06-10 DIAGNOSIS — Q780 Osteogenesis imperfecta: Secondary | ICD-10-CM | POA: Diagnosis not present

## 2021-06-10 DIAGNOSIS — Z96611 Presence of right artificial shoulder joint: Secondary | ICD-10-CM | POA: Diagnosis present

## 2021-06-10 DIAGNOSIS — Z7984 Long term (current) use of oral hypoglycemic drugs: Secondary | ICD-10-CM | POA: Diagnosis not present

## 2021-06-10 DIAGNOSIS — Z66 Do not resuscitate: Secondary | ICD-10-CM | POA: Diagnosis present

## 2021-06-10 DIAGNOSIS — Z7901 Long term (current) use of anticoagulants: Secondary | ICD-10-CM | POA: Diagnosis not present

## 2021-06-10 DIAGNOSIS — M7989 Other specified soft tissue disorders: Secondary | ICD-10-CM | POA: Diagnosis present

## 2021-06-10 DIAGNOSIS — R5383 Other fatigue: Secondary | ICD-10-CM | POA: Diagnosis present

## 2021-06-10 DIAGNOSIS — G14 Postpolio syndrome: Secondary | ICD-10-CM | POA: Diagnosis present

## 2021-06-10 DIAGNOSIS — W19XXXA Unspecified fall, initial encounter: Secondary | ICD-10-CM | POA: Diagnosis present

## 2021-06-10 DIAGNOSIS — Z87442 Personal history of urinary calculi: Secondary | ICD-10-CM | POA: Diagnosis not present

## 2021-06-10 DIAGNOSIS — Z20822 Contact with and (suspected) exposure to covid-19: Secondary | ICD-10-CM | POA: Diagnosis present

## 2021-06-10 LAB — CBC WITH DIFFERENTIAL/PLATELET
Abs Immature Granulocytes: 0.03 10*3/uL (ref 0.00–0.07)
Basophils Absolute: 0.1 10*3/uL (ref 0.0–0.1)
Basophils Relative: 1 %
Eosinophils Absolute: 0.5 10*3/uL (ref 0.0–0.5)
Eosinophils Relative: 5 %
HCT: 42.2 % (ref 39.0–52.0)
Hemoglobin: 13.4 g/dL (ref 13.0–17.0)
Immature Granulocytes: 0 %
Lymphocytes Relative: 21 %
Lymphs Abs: 2.2 10*3/uL (ref 0.7–4.0)
MCH: 28.8 pg (ref 26.0–34.0)
MCHC: 31.8 g/dL (ref 30.0–36.0)
MCV: 90.6 fL (ref 80.0–100.0)
Monocytes Absolute: 0.9 10*3/uL (ref 0.1–1.0)
Monocytes Relative: 9 %
Neutro Abs: 6.6 10*3/uL (ref 1.7–7.7)
Neutrophils Relative %: 64 %
Platelets: 298 10*3/uL (ref 150–400)
RBC: 4.66 MIL/uL (ref 4.22–5.81)
RDW: 14.6 % (ref 11.5–15.5)
WBC: 10.4 10*3/uL (ref 4.0–10.5)
nRBC: 0 % (ref 0.0–0.2)

## 2021-06-10 LAB — COMPREHENSIVE METABOLIC PANEL
ALT: 18 U/L (ref 0–44)
AST: 21 U/L (ref 15–41)
Albumin: 3.3 g/dL — ABNORMAL LOW (ref 3.5–5.0)
Alkaline Phosphatase: 64 U/L (ref 38–126)
Anion gap: 8 (ref 5–15)
BUN: 12 mg/dL (ref 8–23)
CO2: 23 mmol/L (ref 22–32)
Calcium: 8.4 mg/dL — ABNORMAL LOW (ref 8.9–10.3)
Chloride: 103 mmol/L (ref 98–111)
Creatinine, Ser: 0.85 mg/dL (ref 0.61–1.24)
GFR, Estimated: >60 mL/min (ref >60)
Glucose, Bld: 149 mg/dL — ABNORMAL HIGH (ref 70–99)
Potassium: 3.9 mmol/L (ref 3.5–5.1)
Sodium: 134 mmol/L — ABNORMAL LOW (ref 135–145)
Total Bilirubin: 0.9 mg/dL (ref 0.3–1.2)
Total Protein: 6.1 g/dL — ABNORMAL LOW (ref 6.5–8.1)

## 2021-06-10 LAB — GLUCOSE, CAPILLARY
Glucose-Capillary: 167 mg/dL — ABNORMAL HIGH (ref 70–99)
Glucose-Capillary: 262 mg/dL — ABNORMAL HIGH (ref 70–99)
Glucose-Capillary: 234 mg/dL — ABNORMAL HIGH (ref 70–99)
Glucose-Capillary: 257 mg/dL — ABNORMAL HIGH (ref 70–99)

## 2021-06-10 LAB — MAGNESIUM: Magnesium: 2.1 mg/dL (ref 1.7–2.4)

## 2021-06-10 MED ORDER — INSULIN ASPART 100 UNIT/ML IJ SOLN
4.0000 [IU] | Freq: Three times a day (TID) | INTRAMUSCULAR | Status: DC
Start: 2021-06-10 — End: 2021-06-11
  Administered 2021-06-10: 4 [IU] via SUBCUTANEOUS

## 2021-06-10 NOTE — Progress Notes (Signed)
PROGRESS NOTE   Kurt Flatteryaul Kurt [name]  ZOX:[id number] DOB: 15-Feb-1951 DOA: 06/08/2021 [facility]   No chief complaint on file.  Level of care: Med-Surg  Brief Admission History:  70 y.o. male, with history of postpolio syndrome, type 2 diabetes mellitus, hypertension, GERD and more presents the ED with a chief complaint of skin infection.  Patient reports that symptoms have been ongoing for 2 days.  He noticed blistering on his leg and he tried to put Neosporin on it which helped, but then the blisters ruptured releasing watery clear fluid.  He reports that it started hurting with burning and deep pain.  He has never had pain there from the venous stasis changes, so he was concerned about that.  He saw Dr. Claudina LickUrget care who told him to come into the ED   Assessment & Plan:   Principal Problem:   Cellulitis of left lower extremity Active Problems:   Type 2 diabetes mellitus (HCC)   Cellulitis   Hypertension   HLD (hyperlipidemia)   Bilateral cellulitis of lower leg  Cellulitis of bilateral lower extremities - secondary to open/cracked skin caused by chronic venous stasis edema to bilateral LEs.  Continue IV antibiotics as ordered.    Severe venous stasis edema BLEs - discussed with WOC team and they recommended wrapping lower extremities which was done on 6/24 with plans for them to see him on 6/27.  He also has outpatient follow up with his wound care center and they can wrap them there and follow him.    Uncontrolled type 2 diabetes mellitus - continue SSI coverage, carb modified diet and CBG testing, lantus as ordered.  CBG (last 3)  Recent Labs    06/09/21 1604 06/09/21 2109 06/10/21 0745  GLUCAP 220* 153* 167*   Essential hypertension - continue lisinopril daily.   Hyperlipidemia - continue home statin therapy.     DVT prophylaxis: SCD/apixaban  Code Status: full  Family Communication: plan of care discussed with patient at  bedside Disposition:  Status is: Inpatient  Remains inpatient appropriate because:Inpatient level of care appropriate due to severity of illness  Dispo: The patient is from: Home              Anticipated Kurt/c is to: Home              Patient currently is not medically stable to Kurt/c.   Difficult to place patient No  Consultants:  Wound care   Procedures:  Legs wrapped  Antimicrobials:  Cefepime  Vanc    Subjective: Pt reports he was able to tolerate having legs wrapped.   Objective: Vitals:   06/09/21 1435 06/09/21 2045 06/10/21 0514 06/10/21 0808  BP: 129/79 108/77 107/73 122/80  Pulse: 84 82 76   Resp: 18 18 18    Temp: 98.1 F (36.7 C) 98.4 F (36.9 C) 98.1 F (36.7 C)   TempSrc:  Oral Oral   SpO2: 100% 100% 97%   Weight:      Height:        Intake/Output Summary (Last 24 hours) at 06/10/2021 1151 Last data filed at 06/10/2021 0900 Gross per 24 hour  Intake 1034.49 ml  Output 1600 ml  Net -565.51 ml   Filed Weights   06/09/21 1112 06/09/21 1238  Weight: 91.6 kg 94.7 kg    Examination:  General exam: Appears calm and comfortable  Respiratory system: Clear to auscultation. Respiratory effort normal. Cardiovascular system: normal S1 & S2 heard. No JVD, murmurs,  rubs, gallops or clicks. No pedal edema. Gastrointestinal system: Abdomen is nondistended, soft and nontender. No organomegaly or masses felt. Normal bowel sounds heard. Central nervous system: Alert and oriented. No focal neurological deficits. Extremities: bilateral LE wrapped.  Moderate edema BLEs, warm feet with palpable pulses. Skin: No rashes, lesions or ulcers Psychiatry: Judgement and insight appear normal. Mood & affect appropriate.   Data Reviewed: I have personally reviewed following labs and imaging studies  CBC: Recent Labs  Lab 06/08/21 2045 06/09/21 0335 06/10/21 0611  WBC 11.4* 10.8* 10.4  NEUTROABS  --   --  6.6  HGB 14.7 13.7 13.4  HCT 46.5 44.0 42.2  MCV 91.5 91.5 90.6   PLT 327 299 298    Basic Metabolic Panel: Recent Labs  Lab 06/08/21 2045 06/09/21 0335 06/10/21 0611  NA 138 138 134*  K 3.8 3.9 3.9  CL 103 106 103  CO2 28 24 23   GLUCOSE 143* 121* 149*  BUN 15 13 12   CREATININE 0.98 0.84 0.85  CALCIUM 9.1 8.6* 8.4*  MG  --  1.9 2.1    GFR: Estimated Creatinine Clearance: 84.3 mL/min (by C-G formula based on SCr of 0.85 mg/dL).  Liver Function Tests: Recent Labs  Lab 06/08/21 2045 06/09/21 0335 06/10/21 0611  AST 16 15 21   ALT 18 16 18   ALKPHOS 88 71 64  BILITOT 0.5 0.6 0.9  PROT 7.3 6.6 6.1*  ALBUMIN 4.0 3.6 3.3*    CBG: Recent Labs  Lab 06/09/21 0954 06/09/21 1234 06/09/21 1604 06/09/21 2109 06/10/21 0745  GLUCAP 123* 152* 220* 153* 167*    Recent Results (from the past 240 hour(s))  Resp Panel by RT-PCR (Flu A&B, Covid) Nasopharyngeal Swab     Status: None   Collection Time: 06/08/21  9:43 PM   Specimen: Nasopharyngeal Swab; Nasopharyngeal(NP) swabs in vial transport medium  Result Value Ref Range Status   SARS Coronavirus 2 by RT PCR NEGATIVE NEGATIVE Final    Comment: (NOTE) SARS-CoV-2 target nucleic acids are NOT DETECTED.  The SARS-CoV-2 RNA is generally detectable in upper respiratory specimens during the acute phase of infection. The lowest concentration of SARS-CoV-2 viral copies this assay can detect is 138 copies/mL. A negative result does not preclude SARS-Cov-2 infection and should not be used as the sole basis for treatment or other patient management decisions. A negative result may occur with  improper specimen collection/handling, submission of specimen other than nasopharyngeal swab, presence of viral mutation(s) within the areas targeted by this assay, and inadequate number of viral copies(<138 copies/mL). A negative result must be combined with clinical observations, patient history, and epidemiological information. The expected result is Negative.  Fact Sheet for Patients:   06/11/21  Fact Sheet for Healthcare Providers:  2110  This test is no t yet approved or cleared by the 06/12/21 FDA and  has been authorized for detection and/or diagnosis of SARS-CoV-2 by FDA under an Emergency Use Authorization (EUA). This EUA will remain  in effect (meaning this test can be used) for the duration of the COVID-19 declaration under Section 564(b)(1) of the Act, 21 U.S.C.section 360bbb-3(b)(1), unless the authorization is terminated  or revoked sooner.       Influenza A by PCR NEGATIVE NEGATIVE Final   Influenza B by PCR NEGATIVE NEGATIVE Final    Comment: (NOTE) The Xpert Xpress SARS-CoV-2/FLU/RSV plus assay is intended as an aid in the diagnosis of influenza from Nasopharyngeal swab specimens and should not be used as a sole basis  for treatment. Nasal washings and aspirates are unacceptable for Xpert Xpress SARS-CoV-2/FLU/RSV testing.  Fact Sheet for Patients: BloggerCourse.com  Fact Sheet for Healthcare Providers: SeriousBroker.it  This test is not yet approved or cleared by the [place] FDA and has been authorized for detection and/or diagnosis of SARS-CoV-2 by FDA under an Emergency Use Authorization (EUA). This EUA will remain in effect (meaning this test can be used) for the duration of the COVID-19 declaration under Section 564(b)(1) of the Act, 21 U.S.C. section 360bbb-3(b)(1), unless the authorization is terminated or revoked.  Performed at [facility], [address] [place], [place] [address]   Blood culture (routine x 2)     Status: None (Preliminary result)   Collection Time: 06/08/21  9:44 PM   Specimen: BLOOD  Result Value Ref Range Status   Specimen Description BLOOD LEFT ANTECUBITAL  Final   Special Requests   Final    Blood Culture adequate volume BOTTLES DRAWN AEROBIC AND ANAEROBIC   Culture   Final     NO GROWTH 2 DAYS Performed at [facility], [address] [place], [place] [address]    Report Status PENDING  Incomplete  Blood culture (routine x 2)     Status: None (Preliminary result)   Collection Time: 06/08/21  9:54 PM   Specimen: BLOOD  Result Value Ref Range Status   Specimen Description BLOOD BLOOD LEFT HAND  Final   Special Requests   Final    Blood Culture adequate volume BOTTLES DRAWN AEROBIC AND ANAEROBIC   Culture   Final    NO GROWTH 2 DAYS Performed at [facility], [address] [place], [place] [address]    Report Status PENDING  Incomplete     Radiology Studies: US ARTERIAL ABI (SCREENING LOWER EXTREMITY)  Result Date: 06/09/2021 CLINICAL DATA:  70 year old with bilateral lower extremity edema and redness. EXAM: NONINVASIVE PHYSIOLOGIC VASCULAR STUDY OF BILATERAL LOWER EXTREMITIES TECHNIQUE: Evaluation of both lower extremities were performed at rest, including calculation of ankle-brachial indices with single level Doppler, pressure and pulse volume recording. COMPARISON:  None. FINDINGS: Right ABI:  0.72 Left ABI:  1.09 Right Lower Extremity: No significant blood flow or signal in the right dorsalis pedis artery. Irregular waveforms at the posterior tibial artery. Left Lower Extremity: Slightly irregular waveforms at the left ankle could be related to the cardiac rhythm. There are some triphasic Doppler waveforms. 0.5-0.79 Moderate PAD IMPRESSION: 1. Right ankle-brachial index is 0.72 and suggestive for moderate peripheral arterial disease in the right lower extremity. 2. Left ankle-brachial index is within normal limits, measuring 1.09. Electronically Signed   By: Richarda Overlie M.Kurt.   On: 06/09/2021 11:48    Scheduled Meds:  apixaban  5 mg Oral BID   aspirin  81 mg Oral Daily   insulin aspart  0-15 Units Subcutaneous TID WC   insulin aspart  0-5 Units Subcutaneous QHS   insulin glargine  34 Units Subcutaneous QHS   lisinopril  20 mg Oral Daily   simvastatin  20 mg  Oral QPM   Continuous Infusions:  ceFEPime (MAXIPIME) IV 2 g (06/10/21 0554)   vancomycin 1,000 mg (06/10/21 0204)     LOS: 0 days   Time spent: 35 mins   Sahas Sluka Laural Benes, MD How to contact the [facility] Attending or Consulting provider 7A - 7P or covering provider during after hours 7P -7A, for this patient?  Check the care team in [facility] and look for a) attending/consulting TRH provider listed and b) the TRH  team listed Log into [email or web address] and use [place]'s universal password to access. If you do not have the password, please contact the hospital operator. Locate the [facility] provider you are looking for under [facility] and page to a number that you can be directly reached. If you still have difficulty reaching the provider, please page the [facility] (Director on Call) for the Hospitalists listed on amion for assistance.  06/10/2021, 11:51 AM

## 2021-06-11 LAB — GLUCOSE, CAPILLARY
Glucose-Capillary: 342 mg/dL — ABNORMAL HIGH (ref 70–99)
Glucose-Capillary: 192 mg/dL — ABNORMAL HIGH (ref 70–99)
Glucose-Capillary: 147 mg/dL — ABNORMAL HIGH (ref 70–99)

## 2021-06-11 MED ORDER — INSULIN ASPART 100 UNIT/ML IJ SOLN
6.0000 [IU] | Freq: Three times a day (TID) | INTRAMUSCULAR | Status: DC
Start: 2021-06-11 — End: 2021-06-12
  Administered 2021-06-11 – 2021-06-12 (×5): 6 [IU] via SUBCUTANEOUS

## 2021-06-11 NOTE — Progress Notes (Signed)
PROGRESS NOTE   Kurt [name]  GHW:[id number] DOB: 1951-10-16 DOA: 06/08/2021 PCP: [facility]   No chief complaint on file.  Level of care: Med-Surg  Brief Admission History:  70 y.o. male, with history of postpolio syndrome, type 2 diabetes mellitus, hypertension, GERD and more presents the ED with a chief complaint of skin infection.  Patient reports that symptoms have been ongoing for 2 days.  He noticed blistering on his leg and he tried to put Neosporin on it which helped, but then the blisters ruptured releasing watery clear fluid.  He reports that it started hurting with burning and deep pain.  He has never had pain there from the venous stasis changes, so he was concerned about that.  He saw Dr. Claudina Lick care who told him to come into the ED  Assessment & Plan:   Principal Problem:   Cellulitis of left lower extremity Active Problems:   Type 2 diabetes mellitus (HCC)   Cellulitis   Hypertension   HLD (hyperlipidemia)   Bilateral cellulitis of lower leg  Cellulitis of bilateral lower extremities - secondary to open/cracked skin caused by chronic venous stasis edema to bilateral LEs.  Continue IV antibiotics as ordered.    Severe venous stasis edema BLEs - discussed with WOC team and they recommended wrapping lower extremities which was done on 6/24 with plans for them to see him on 6/27.  He also has outpatient follow up with his wound care center and they can wrap them there and follow him.    Uncontrolled type 2 diabetes mellitus - continue SSI coverage, carb modified diet and CBG testing, lantus as ordered.  CBG (last 3)  Recent Labs    06/10/21 2057 06/11/21 1116 06/11/21 1613  GLUCAP 257* 342* 192*   Essential hypertension - continue lisinopril daily.   Hyperlipidemia - continue home statin therapy.     DVT prophylaxis: SCD/apixaban  Code Status: full  Family Communication: plan of care discussed with patient at bedside Disposition:   Status is: Inpatient  Remains inpatient appropriate because:Inpatient level of care appropriate due to severity of illness  Dispo: The patient is from: Home              Anticipated d/c is to: Home              Patient currently is not medically stable to d/c.   Difficult to place patient No  Consultants:  Wound care   Procedures:  Legs wrapped  Antimicrobials:  Cefepime  Vanc    Subjective: Pt reports that his legs feel tight with the wraps but he is able to tolerate them.    Objective: Vitals:   06/10/21 1411 06/10/21 2055 06/11/21 0402 06/11/21 1349  BP: 101/71 103/72 105/66 (!) 97/58  Pulse: 80 80 80 81  Resp: 18 18 20 16   Temp: 98.5 F (36.9 C) 97.9 F (36.6 C) 97.7 F (36.5 C) (!) 97.5 F (36.4 C)  TempSrc: Oral Oral Oral Oral  SpO2: 98% 98% 98% 98%  Weight:      Height:        Intake/Output Summary (Last 24 hours) at 06/11/2021 1733 Last data filed at 06/11/2021 1300 Gross per 24 hour  Intake 1260 ml  Output 2350 ml  Net -1090 ml   Filed Weights   06/09/21 1112 06/09/21 1238  Weight: 91.6 kg 94.7 kg   Examination:  General exam: Appears calm and comfortable  Respiratory system: Clear to auscultation. Respiratory effort  normal. Cardiovascular system: normal S1 & S2 heard. No JVD, murmurs, rubs, gallops or clicks. No pedal edema. Gastrointestinal system: Abdomen is nondistended, soft and nontender. No organomegaly or masses felt. Normal bowel sounds heard. Central nervous system: Alert and oriented. No focal neurological deficits. Extremities: bilateral LE wrapped.  Moderate edema BLEs, warm feet with palpable pulses. Skin: No rashes, lesions or ulcers Psychiatry: Judgement and insight appear normal. Mood & affect appropriate.   Data Reviewed: I have personally reviewed following labs and imaging studies  CBC: Recent Labs  Lab 06/08/21 2045 06/09/21 0335 06/10/21 0611  WBC 11.4* 10.8* 10.4  NEUTROABS  --   --  6.6  HGB 14.7 13.7 13.4  HCT  46.5 44.0 42.2  MCV 91.5 91.5 90.6  PLT 327 299 298    Basic Metabolic Panel: Recent Labs  Lab 06/08/21 2045 06/09/21 0335 06/10/21 0611  NA 138 138 134*  K 3.8 3.9 3.9  CL 103 106 103  CO2 28 24 23   GLUCOSE 143* 121* 149*  BUN 15 13 12   CREATININE 0.98 0.84 0.85  CALCIUM 9.1 8.6* 8.4*  MG  --  1.9 2.1    GFR: Estimated Creatinine Clearance: 84.3 mL/min (by C-G formula based on SCr of 0.85 mg/dL).  Liver Function Tests: Recent Labs  Lab 06/08/21 2045 06/09/21 0335 06/10/21 0611  AST 16 15 21   ALT 18 16 18   ALKPHOS 88 71 64  BILITOT 0.5 0.6 0.9  PROT 7.3 6.6 6.1*  ALBUMIN 4.0 3.6 3.3*    CBG: Recent Labs  Lab 06/10/21 1104 06/10/21 1610 06/10/21 2057 06/11/21 1116 06/11/21 1613  GLUCAP 262* 234* 257* 342* 192*    Recent Results (from the past 240 hour(s))  Resp Panel by RT-PCR (Flu A&B, Covid) Nasopharyngeal Swab     Status: None   Collection Time: 06/08/21  9:43 PM   Specimen: Nasopharyngeal Swab; Nasopharyngeal(NP) swabs in vial transport medium  Result Value Ref Range Status   SARS Coronavirus 2 by RT PCR NEGATIVE NEGATIVE Final    Comment: (NOTE) SARS-CoV-2 target nucleic acids are NOT DETECTED.  The SARS-CoV-2 RNA is generally detectable in upper respiratory specimens during the acute phase of infection. The lowest concentration of SARS-CoV-2 viral copies this assay can detect is 138 copies/mL. A negative result does not preclude SARS-Cov-2 infection and should not be used as the sole basis for treatment or other patient management decisions. A negative result may occur with  improper specimen collection/handling, submission of specimen other than nasopharyngeal swab, presence of viral mutation(s) within the areas targeted by this assay, and inadequate number of viral copies(<138 copies/mL). A negative result must be combined with clinical observations, patient history, and epidemiological information. The expected result is Negative.  Fact  Sheet for Patients:  06/12/21  Fact Sheet for Healthcare Providers:  06/13/21  This test is no t yet approved or cleared by the 06/13/21 FDA and  has been authorized for detection and/or diagnosis of SARS-CoV-2 by FDA under an Emergency Use Authorization (EUA). This EUA will remain  in effect (meaning this test can be used) for the duration of the COVID-19 declaration under Section 564(b)(1) of the Act, 21 U.S.C.section 360bbb-3(b)(1), unless the authorization is terminated  or revoked sooner.       Influenza A by PCR NEGATIVE NEGATIVE Final   Influenza B by PCR NEGATIVE NEGATIVE Final    Comment: (NOTE) The Xpert Xpress SARS-CoV-2/FLU/RSV plus assay is intended as an aid in the diagnosis of influenza from Nasopharyngeal  swab specimens and should not be used as a sole basis for treatment. Nasal washings and aspirates are unacceptable for Xpert Xpress SARS-CoV-2/FLU/RSV testing.  Fact Sheet for Patients: BloggerCourse.com  Fact Sheet for Healthcare Providers: SeriousBroker.it  This test is not yet approved or cleared by the [place] FDA and has been authorized for detection and/or diagnosis of SARS-CoV-2 by FDA under an Emergency Use Authorization (EUA). This EUA will remain in effect (meaning this test can be used) for the duration of the COVID-19 declaration under Section 564(b)(1) of the Act, 21 U.S.C. section 360bbb-3(b)(1), unless the authorization is terminated or revoked.  Performed at [facility], [address] [place], [place] [address]   Blood culture (routine x 2)     Status: None (Preliminary result)   Collection Time: 06/08/21  9:44 PM   Specimen: BLOOD  Result Value Ref Range Status   Specimen Description BLOOD LEFT ANTECUBITAL  Final   Special Requests   Final    Blood Culture adequate volume BOTTLES DRAWN AEROBIC AND ANAEROBIC    Culture   Final    NO GROWTH 3 DAYS Performed at [facility], [address], [place], [place] [address]    Report Status PENDING  Incomplete  Blood culture (routine x 2)     Status: None (Preliminary result)   Collection Time: 06/08/21  9:54 PM   Specimen: BLOOD  Result Value Ref Range Status   Specimen Description BLOOD BLOOD LEFT HAND  Final   Special Requests   Final    Blood Culture adequate volume BOTTLES DRAWN AEROBIC AND ANAEROBIC   Culture   Final    NO GROWTH 3 DAYS Performed at [facility], [address], [place], [place] [address]    Report Status PENDING  Incomplete     Radiology Studies: No results found.  Scheduled Meds:  apixaban  5 mg Oral BID   aspirin  81 mg Oral Daily   insulin aspart  0-15 Units Subcutaneous TID WC   insulin aspart  0-5 Units Subcutaneous QHS   insulin aspart  6 Units Subcutaneous TID WC   insulin glargine  34 Units Subcutaneous QHS   lisinopril  20 mg Oral Daily   simvastatin  20 mg Oral QPM   Continuous Infusions:  ceFEPime (MAXIPIME) IV 2 g (06/11/21 1347)   vancomycin 1,000 mg (06/11/21 1430)    LOS: 1 day   Time spent: 35 mins   Albert Hersch Laural Benes, MD How to contact the [facility] Attending or Consulting provider 7A - 7P or covering provider during after hours 7P -7A, for this patient?  Check the care team in [facility] and look for a) attending/consulting TR[facility] provider listed and b) the [facility] team listed Log into [email or web address] and use [place]'s universal password to access. If you do not have the password, please contact the hospital operator. Locate the [facility] provider you are looking for under [facility] and page to a number that you can be directly reached. If you still have difficulty reaching the provider, please page the [facility] (Director on Call) for the Hospitalists listed on amion for assistance.  06/11/2021, 5:33 PM

## 2021-06-12 LAB — GLUCOSE, CAPILLARY
Glucose-Capillary: 150 mg/dL — ABNORMAL HIGH (ref 70–99)
Glucose-Capillary: 169 mg/dL — ABNORMAL HIGH (ref 70–99)
Glucose-Capillary: 291 mg/dL — ABNORMAL HIGH (ref 70–99)

## 2021-06-12 MED ORDER — DOXYCYCLINE HYCLATE 100 MG PO CAPS: 100.0000 mg | ORAL_CAPSULE | Freq: Two times a day (BID) | ORAL | 0 refills | Status: AC

## 2021-06-12 NOTE — Consult Note (Signed)
WOC Nurse wound follow up Patient receiving care in [facility]. Profore wraps placed Friday of last week.  Profore wraps should be changed tomorrow.  I have communicated this to Dr. Timoteo Expose via Secure Chat. Helmut Muster, RN, MSN, CWOCN, CNS-BC, pager [phone]

## 2021-06-12 NOTE — Discharge Summary (Signed)
Physician Discharge Summary  Kurt [name] IHK:[id number] DOB: 07-Jan-1951 DOA: 06/08/2021  PCP: [facility]  Admit date: 06/08/2021 Discharge date: 06/12/2021  Admitted From:  Home  Disposition: Home   Recommendations for Outpatient Follow-up:  Follow up with PCP in 3-4 days to have wraps removed.  Follow up with wound care center for ongoing wound care   Discharge Condition: STABLE   CODE STATUS: FULL DIET: resume prior home diet   Brief Hospitalization Summary: Please see all hospital notes, images, labs for full details of the hospitalization. ADMISSION HPI:  Kurt [name]  is a 70 y.o. male, with history of postpolio syndrome, type 2 diabetes mellitus, hypertension, GERD and more presents the ED with a chief complaint of skin infection.  Patient reports that symptoms have been ongoing for 2 days.  He noticed blistering on his leg and he tried to put Neosporin on it which helped, but then the blisters ruptured releasing watery clear fluid.  He reports that it started hurting with burning and deep pain.  He has never had pain there from the venous stasis changes, so he was concerned about that.  He saw Dr. Today who told him to come into the ED.  He reports some bleeding when he would try to scratch it with a towel.  He reports no purulent drainage.  He has not had any fevers, nausea vomiting.  He does report that he has had fatigue, but that sometimes normal for him.  He initially started having trouble with this lower extremity when he fell through the floor in spring 2020.  He has had several bouts of cellulitis since then with his last 1 being a 6 months ago.  Patient is a diabetic and has some venous insufficiency so the light does not ever fully heal.  Patient has no other complaints at this time.   Patient does not smoke, does not drink alcohol, does not use illicit drugs.  He is vaccinated for COVID with booster, and he is DNR.   In the ED Temp 99.9,  heart rate 88-1 05, respiratory rate 18-20, blood pressure 139/102, satting at 100% Slight leukocytosis with a white blood cell count of 11.4, hemoglobin 14.7 Chemistry panel is unremarkable Respiratory panel is negative Blood cultures pending Rocephin started in the ED Admission requested for further management of cellulitis.  Patient likely to dispo within 24 hours.   HOSPITAL COURSE BY PROBLEM   Cellulitis of bilateral lower extremities - secondary to open/cracked skin caused by chronic venous stasis edema to bilateral LEs.  He was treated with IV antibiotics with good results.  His legs were wrapped by wound care nursing and he is feeling much better.  He will discharge with 5 more days of oral doxycycline.  He was advised that he will need to have leg wraps removed in next 5-7 days.  He says he will go to his PCP office walk in clinic to have wraps removed in next 4 days.  He will continue to follow up with his wound care center.       Severe venous stasis edema BLEs - discussed with WOC team and they recommended wrapping lower extremities which was done on 6/24 and 6/27.  He tolerated it well.  He was told that he will need to have wraps removed in next 5-7 days.  He also has outpatient follow up with his wound care center and they can wrap them there and follow him.  He will go  to [facility] to have leg wraps removed later this week.     Uncontrolled type 2 diabetes mellitus - continue SSI coverage, carb modified diet and CBG testing, lantus as ordered. CBG (last 3)  Recent Labs (last 2 labs)        Recent Labs    06/10/21 2057 06/11/21 1116 06/11/21 1613  GLUCAP 257* 342* 192*      Essential hypertension - continue lisinopril daily.   Hyperlipidemia - continue home statin therapy.      DVT prophylaxis: SCD/apixaban Code Status: full Family Communication: plan of care discussed with patient at bedside Disposition: Home (social worker unable to arrange HH  due to patient's insurance)  Discharge Diagnoses:  Principal Problem:   Cellulitis of left lower extremity Active Problems:   Type 2 diabetes mellitus (HCC)   Cellulitis   Hypertension   HLD (hyperlipidemia)   Bilateral cellulitis of lower leg   Discharge Instructions: Discharge Instructions     AMB referral to wound care center   Complete by: As directed    Ambulatory referral to Physical Therapy   Complete by: As directed       Allergies as of 06/12/2021       Reactions   Augmentin [amoxicillin-pot Clavulanate] Nausea And Vomiting   Iodine Hives   Tape Rash   Paper         Medication List     TAKE these medications    acetaminophen 650 MG CR tablet Commonly known as: TYLENOL Take 1,300 mg by mouth every 8 (eight) hours as needed for pain.   aspirin 81 MG chewable tablet Chew 1 tablet (81 mg total) by mouth daily.   doxycycline 100 MG capsule Commonly known as: VIBRAMYCIN Take 1 capsule (100 mg total) by mouth 2 (two) times daily for 5 days.   Eliquis 2.5 MG Tabs tablet Generic drug: apixaban Take 2.5 mg by mouth 2 (two) times daily.   glipiZIDE 10 MG 24 hr tablet Commonly known as: GLUCOTROL XL Take 10 mg by mouth at bedtime.   Lantus SoloStar 100 UNIT/ML Solostar Pen Generic drug: insulin glargine Inject 34 Units into the skin at bedtime.   lisinopril 20 MG tablet Commonly known as: ZESTRIL Take 20 mg by mouth daily.   methocarbamol 500 MG tablet Commonly known as: ROBAXIN Take 500 mg by mouth at bedtime as needed for muscle spasms.   OneTouch Ultra test strip Generic drug: glucose blood 1 each 2 (two) times daily.   oxyCODONE 5 MG immediate release tablet Commonly known as: Oxy IR/ROXICODONE Take 1 tablet (5 mg total) by mouth every 8 (eight) hours as needed for moderate pain (pain score 4-6).   simvastatin 20 MG tablet Commonly known as: ZOCOR Take 20 mg by mouth every evening.   SitaGLIPtin-MetFORMIN HCl [phone] MG Tb24 Take 1  tablet by mouth at bedtime.        Follow-up Information     [facility] Schedule an appointment as soon as possible for a visit in 1 week(s).   Why: Hospital Follow Up, For wound re-check Contact information: [address] [place] [address] [phone]                Allergies  Allergen Reactions   Augmentin [Amoxicillin-Pot Clavulanate] Nausea And Vomiting   Iodine Hives   Tape Rash    Paper    Allergies as of 06/12/2021       Reactions   Augmentin [amoxicillin-pot Clavulanate]  Nausea And Vomiting   Iodine Hives   Tape Rash   Paper         Medication List     TAKE these medications    acetaminophen 650 MG CR tablet Commonly known as: TYLENOL Take 1,300 mg by mouth every 8 (eight) hours as needed for pain.   aspirin 81 MG chewable tablet Chew 1 tablet (81 mg total) by mouth daily.   doxycycline 100 MG capsule Commonly known as: VIBRAMYCIN Take 1 capsule (100 mg total) by mouth 2 (two) times daily for 5 days.   Eliquis 2.5 MG Tabs tablet Generic drug: apixaban Take 2.5 mg by mouth 2 (two) times daily.   glipiZIDE 10 MG 24 hr tablet Commonly known as: GLUCOTROL XL Take 10 mg by mouth at bedtime.   Lantus SoloStar 100 UNIT/ML Solostar Pen Generic drug: insulin glargine Inject 34 Units into the skin at bedtime.   lisinopril 20 MG tablet Commonly known as: ZESTRIL Take 20 mg by mouth daily.   methocarbamol 500 MG tablet Commonly known as: ROBAXIN Take 500 mg by mouth at bedtime as needed for muscle spasms.   OneTouch Ultra test strip Generic drug: glucose blood 1 each 2 (two) times daily.   oxyCODONE 5 MG immediate release tablet Commonly known as: Oxy IR/ROXICODONE Take 1 tablet (5 mg total) by mouth every 8 (eight) hours as needed for moderate pain (pain score 4-6).   simvastatin 20 MG tablet Commonly known as: ZOCOR Take 20 mg by mouth every evening.   SitaGLIPtin-MetFORMIN HCl [phone] MG Tb24 Take  1 tablet by mouth at bedtime.        Procedures/Studies: US ARTERIAL ABI (SCREENING LOWER EXTREMITY)  Result Date: 06/09/2021 CLINICAL DATA:  70 year old with bilateral lower extremity edema and redness. EXAM: NONINVASIVE PHYSIOLOGIC VASCULAR STUDY OF BILATERAL LOWER EXTREMITIES TECHNIQUE: Evaluation of both lower extremities were performed at rest, including calculation of ankle-brachial indices with single level Doppler, pressure and pulse volume recording. COMPARISON:  None. FINDINGS: Right ABI:  0.72 Left ABI:  1.09 Right Lower Extremity: No significant blood flow or signal in the right dorsalis pedis artery. Irregular waveforms at the posterior tibial artery. Left Lower Extremity: Slightly irregular waveforms at the left ankle could be related to the cardiac rhythm. There are some triphasic Doppler waveforms. 0.5-0.79 Moderate PAD IMPRESSION: 1. Right ankle-brachial index is 0.72 and suggestive for moderate peripheral arterial disease in the right lower extremity. 2. Left ankle-brachial index is within normal limits, measuring 1.09. Electronically Signed   By: Richarda Overlie M.D.   On: 06/09/2021 11:48     Subjective: Pt reports that he feels much better with legs now wrapped, he says he will be sure to go have the wraps removed by this Monday.    Discharge Exam: Vitals:   06/12/21 0527 06/12/21 1231  BP: 117/73 117/72  Pulse: 80 81  Resp: 18 18  Temp: 98.4 F (36.9 C) 97.9 F (36.6 C)  SpO2: 96% 100%   Vitals:   06/11/21 1349 06/11/21 2124 06/12/21 0527 06/12/21 1231  BP: (!) 97/58 97/60 117/73 117/72  Pulse: 81 73 80 81  Resp: Temp: (!) 97.5 F (36.4 C) 98.2 F (36.8 C) 98.4 F (36.9 C) 97.9 F (36.6 C)  TempSrc: Oral Oral  Oral  SpO2: 98% 98% 96% 100%  Weight:      Height:       General exam: Appears calm and comfortable Respiratory system: Clear to auscultation. Respiratory effort normal.  Cardiovascular system: normal S1 & S2 heard. No JVD, murmurs, rubs,  gallops or clicks. No pedal edema. Gastrointestinal system: Abdomen is nondistended, soft and nontender. No organomegaly or masses felt. Normal bowel sounds heard. Central nervous system: Alert and oriented. No focal neurological deficits. Extremities: bilateral LE wrapped.  Moderate edema BLEs, warm feet with palpable pulses. Skin: No rashes, lesions or ulcers Psychiatry: Judgement and insight appear normal. Mood & affect appropriate.   The results of significant diagnostics from this hospitalization (including imaging, microbiology, ancillary and laboratory) are listed below for reference.     Microbiology: Recent Results (from the past 240 hour(s))  Resp Panel by RT-PCR (Flu A&B, Covid) Nasopharyngeal Swab     Status: None   Collection Time: 06/08/21  9:43 PM   Specimen: Nasopharyngeal Swab; Nasopharyngeal(NP) swabs in vial transport medium  Result Value Ref Range Status   SARS Coronavirus 2 by RT PCR NEGATIVE NEGATIVE Final    Comment: (NOTE) SARS-CoV-2 target nucleic acids are NOT DETECTED.  The SARS-CoV-2 RNA is generally detectable in upper respiratory specimens during the acute phase of infection. The lowest concentration of SARS-CoV-2 viral copies this assay can detect is 138 copies/mL. A negative result does not preclude SARS-Cov-2 infection and should not be used as the sole basis for treatment or other patient management decisions. A negative result may occur with  improper specimen collection/handling, submission of specimen other than nasopharyngeal swab, presence of viral mutation(s) within the areas targeted by this assay, and inadequate number of viral copies(<138 copies/mL). A negative result must be combined with clinical observations, patient history, and epidemiological information. The expected result is Negative.  Fact Sheet for Patients:  BloggerCourse.comhttps://www.fda.gov/media/152166/download  Fact Sheet for Healthcare Providers:   SeriousBroker.ithttps://www.fda.gov/media/152162/download  This test is no t yet approved or cleared by the [place] FDA and  has been authorized for detection and/or diagnosis of SARS-CoV-2 by FDA under an Emergency Use Authorization (EUA). This EUA will remain  in effect (meaning this test can be used) for the duration of the COVID-19 declaration under Section 564(b)(1) of the Act, 21 U.S.C.section 360bbb-3(b)(1), unless the authorization is terminated  or revoked sooner.       Influenza A by PCR NEGATIVE NEGATIVE Final   Influenza B by PCR NEGATIVE NEGATIVE Final    Comment: (NOTE) The Xpert Xpress SARS-CoV-2/FLU/RSV plus assay is intended as an aid in the diagnosis of influenza from Nasopharyngeal swab specimens and should not be used as a sole basis for treatment. Nasal washings and aspirates are unacceptable for Xpert Xpress SARS-CoV-2/FLU/RSV testing.  Fact Sheet for Patients: BloggerCourse.comhttps://www.fda.gov/media/152166/download  Fact Sheet for Healthcare Providers: SeriousBroker.ithttps://www.fda.gov/media/152162/download  This test is not yet approved or cleared by the [place] FDA and has been authorized for detection and/or diagnosis of SARS-CoV-2 by FDA under an Emergency Use Authorization (EUA). This EUA will remain in effect (meaning this test can be used) for the duration of the COVID-19 declaration under Section 564(b)(1) of the Act, 21 U.S.C. section 360bbb-3(b)(1), unless the authorization is terminated or revoked.  Performed at [facility], [address] [place], [place] [id number]   Blood culture (routine x 2)     Status: None (Preliminary result)   Collection Time: 06/08/21  9:44 PM   Specimen: BLOOD  Result Value Ref Range Status   Specimen Description BLOOD LEFT ANTECUBITAL  Final   Special Requests   Final    Blood Culture adequate volume BOTTLES DRAWN AEROBIC AND ANAEROBIC   Culture   Final  NO GROWTH 4 DAYS Performed at [facility], [address] [place], [place]  [address]    Report Status PENDING  Incomplete  Blood culture (routine x 2)     Status: None (Preliminary result)   Collection Time: 06/08/21  9:54 PM   Specimen: BLOOD  Result Value Ref Range Status   Specimen Description BLOOD BLOOD LEFT HAND  Final   Special Requests   Final    Blood Culture adequate volume BOTTLES DRAWN AEROBIC AND ANAEROBIC   Culture   Final    NO GROWTH 4 DAYS Performed at [facility], [address], [place], [place] [address]    Report Status PENDING  Incomplete     Labs: BNP (last 3 results) No results for input(s): BNP in the last 8760 hours. Basic Metabolic Panel: Recent Labs  Lab 06/08/21 2045 06/09/21 0335 06/10/21 0611  NA 138 138 134*  K 3.8 3.9 3.9  CL 103 106 103  CO2 28 24 23   GLUCOSE 143* 121* 149*  BUN 15 13 12   CREATININE 0.98 0.84 0.85  CALCIUM 9.1 8.6* 8.4*  MG  --  1.9 2.1   Liver Function Tests: Recent Labs  Lab 06/08/21 2045 06/09/21 0335 06/10/21 0611  AST 16 15 21   ALT 18 16 18   ALKPHOS 88 71 64  BILITOT 0.5 0.6 0.9  PROT 7.3 6.6 6.1*  ALBUMIN 4.0 3.6 3.3*   No results for input(s): LIPASE, AMYLASE in the last 168 hours. No results for input(s): AMMONIA in the last 168 hours. CBC: Recent Labs  Lab 06/08/21 2045 06/09/21 0335 06/10/21 0611  WBC 11.4* 10.8* 10.4  NEUTROABS  --   --  6.6  HGB 14.7 13.7 13.4  HCT 46.5 44.0 42.2  MCV 91.5 91.5 90.6  PLT 327 299 298   Cardiac Enzymes: No results for input(s): CKTOTAL, CKMB, CKMBINDEX, TROPONINI in the last 168 hours. BNP: Invalid input(s): POCBNP CBG: Recent Labs  Lab 06/11/21 1116 06/11/21 1613 06/11/21 2127 06/12/21 0732 06/12/21 1113  GLUCAP 342* 192* 147* 169* 291*   D-Dimer No results for input(s): DDIMER in the last 72 hours. Hgb A1c No results for input(s): HGBA1C in the last 72 hours. Lipid Profile No results for input(s): CHOL, HDL, LDLCALC, TRIG, CHOLHDL, LDLDIRECT in the last 72 hours. Thyroid function studies No results for input(s):  TSH, T4TOTAL, T3FREE, THYROIDAB in the last 72 hours.  Invalid input(s): FREET3 Anemia work up No results for input(s): VITAMINB12, FOLATE, FERRITIN, TIBC, IRON, RETICCTPCT in the last 72 hours. Urinalysis    Component Value Date/Time   COLORURINE YELLOW 12/13/2020 1835   APPEARANCEUR CLEAR 12/13/2020 1835   LABSPEC 1.010 12/13/2020 1835   PHURINE 5.0 12/13/2020 1835   GLUCOSEU >=500 (A) 12/13/2020 1835   HGBUR NEGATIVE 12/13/2020 1835   BILIRUBINUR NEGATIVE 12/13/2020 1835   KETONESUR NEGATIVE 12/13/2020 1835   PROTEINUR NEGATIVE 12/13/2020 1835   NITRITE NEGATIVE 12/13/2020 1835   LEUKOCYTESUR NEGATIVE 12/13/2020 1835   Sepsis Labs Invalid input(s): PROCALCITONIN,  WBC,  LACTICIDVEN Microbiology Recent Results (from the past 240 hour(s))  Resp Panel by RT-PCR (Flu A&B, Covid) Nasopharyngeal Swab     Status: None   Collection Time: 06/08/21  9:43 PM   Specimen: Nasopharyngeal Swab; Nasopharyngeal(NP) swabs in vial transport medium  Result Value Ref Range Status   SARS Coronavirus 2 by RT PCR NEGATIVE NEGATIVE Final    Comment: (NOTE) SARS-CoV-2 target nucleic acids are NOT DETECTED.  The SARS-CoV-2 RNA is generally detectable in upper respiratory  specimens during the acute phase of infection. The lowest concentration of SARS-CoV-2 viral copies this assay can detect is 138 copies/mL. A negative result does not preclude SARS-Cov-2 infection and should not be used as the sole basis for treatment or other patient management decisions. A negative result may occur with  improper specimen collection/handling, submission of specimen other than nasopharyngeal swab, presence of viral mutation(s) within the areas targeted by this assay, and inadequate number of viral copies(<138 copies/mL). A negative result must be combined with clinical observations, patient history, and epidemiological information. The expected result is Negative.  Fact Sheet for Patients:   BloggerCourse.com  Fact Sheet for Healthcare Providers:  SeriousBroker.it  This test is no t yet approved or cleared by the [place] FDA and  has been authorized for detection and/or diagnosis of SARS-CoV-2 by FDA under an Emergency Use Authorization (EUA). This EUA will remain  in effect (meaning this test can be used) for the duration of the COVID-19 declaration under Section 564(b)(1) of the Act, 21 U.S.C.section 360bbb-3(b)(1), unless the authorization is terminated  or revoked sooner.       Influenza A by PCR NEGATIVE NEGATIVE Final   Influenza B by PCR NEGATIVE NEGATIVE Final    Comment: (NOTE) The Xpert Xpress SARS-CoV-2/FLU/RSV plus assay is intended as an aid in the diagnosis of influenza from Nasopharyngeal swab specimens and should not be used as a sole basis for treatment. Nasal washings and aspirates are unacceptable for Xpert Xpress SARS-CoV-2/FLU/RSV testing.  Fact Sheet for Patients: BloggerCourse.com  Fact Sheet for Healthcare Providers: SeriousBroker.it  This test is not yet approved or cleared by the [place] FDA and has been authorized for detection and/or diagnosis of SARS-CoV-2 by FDA under an Emergency Use Authorization (EUA). This EUA will remain in effect (meaning this test can be used) for the duration of the COVID-19 declaration under Section 564(b)(1) of the Act, 21 U.S.C. section 360bbb-3(b)(1), unless the authorization is terminated or revoked.  Performed at [facility], [address], [place], [place] [address]   Blood culture (routine x 2)     Status: None (Preliminary result)   Collection Time: 06/08/21  9:44 PM   Specimen: BLOOD  Result Value Ref Range Status   Specimen Description BLOOD LEFT ANTECUBITAL  Final   Special Requests   Final    Blood Culture adequate volume BOTTLES DRAWN AEROBIC AND ANAEROBIC   Culture   Final     NO GROWTH 4 DAYS Performed at [facility], [address] [place], [place] [address]    Report Status PENDING  Incomplete  Blood culture (routine x 2)     Status: None (Preliminary result)   Collection Time: 06/08/21  9:54 PM   Specimen: BLOOD  Result Value Ref Range Status   Specimen Description BLOOD BLOOD LEFT HAND  Final   Special Requests   Final    Blood Culture adequate volume BOTTLES DRAWN AEROBIC AND ANAEROBIC   Culture   Final    NO GROWTH 4 DAYS Performed at [facility], [address], [place], [place] [address]    Report Status PENDING  Incomplete   Time coordinating discharge:   35 minutes   SIGNED:  Standley Dakins, MD  [facility] 06/12/2021, 2:30 PM How to contact the [facility] Attending or Consulting provider 7A - 7P or covering provider during after hours 7P -7A, for this patient?  Check the care team in [facility] and look for a) attending/consulting [facility] provider listed and b) the [facility]  team listed Log into [email or web address] and use [facility] universal password to access. If you do not have the password, please contact the hospital operator. Locate the [facility] provider you are looking for under [facility] and page to a number that you can be directly reached. If you still have difficulty reaching the provider, please page the [facility] (Director on Call) for the Hospitalists listed on amion for assistance.

## 2021-06-12 NOTE — TOC Transition Note (Signed)
Transition of Care [facility]) - CM/SW Discharge Note   Patient Details  Name: Kurt [name] MRN: [id number] Date of Birth: 01-29-1951  Transition of Care [facility]) CM/SW Contact:  Elliot Gault, LCSW Phone Number: 06/12/2021, 2:24 PM   Clinical Narrative:     Pt stable for dc today per MD. Pt in need of referral for outpatient wound care management. Spoke with pt who prefers to go to [facility] outpatient Rehab in [place]. Referral made and appointment scheduled and added to AVS. Pt updated. MD aware. No other TOC needs for dc.   Expected Discharge Plan: Home/Self Care Barriers to Discharge: Barriers Resolved   Patient Goals and CMS Choice Patient states their goals for this hospitalization and ongoing recovery are:: go home CMS Medicare.gov Compare Post Acute Care list provided to:: Patient Choice offered to / list presented to : Patient  Expected Discharge Plan and Services Expected Discharge Plan: Home/Self Care In-house Referral: Clinical Social Work Discharge Planning Services: Follow-up appt scheduled   Living arrangements for the past 2 months: Single Family Home Expected Discharge Date: 06/12/21                                    Prior Living Arrangements/Services Living arrangements for the past 2 months: Single Family Home Lives with:: Self Patient language and need for interpreter reviewed:: Yes Do you feel safe going back to the place where you live?: Yes      Need for Family Participation in Patient Care: Yes (Comment) Care giver support system in place?: No (comment)   Criminal Activity/Legal Involvement Pertinent to Current Situation/Hospitalization: No - Comment as needed  Activities of Daily Living Home Assistive Devices/Equipment: Built-in shower seat, Walker (specify type) ADL Screening (condition at time of admission) Patient's cognitive ability adequate to safely complete daily activities?: Yes Is the patient deaf or have difficulty hearing?:  No Does the patient have difficulty seeing, even when wearing glasses/contacts?: No Does the patient have difficulty concentrating, remembering, or making decisions?: No Patient able to express need for assistance with ADLs?: Yes Does the patient have difficulty dressing or bathing?: No Independently performs ADLs?: Yes (appropriate for developmental age) Does the patient have difficulty walking or climbing stairs?: Yes Weakness of Legs: None Weakness of Arms/Hands: None  Permission Sought/Granted                  Emotional Assessment   Attitude/Demeanor/Rapport: Engaged Affect (typically observed): Pleasant Orientation: : Oriented to Self, Oriented to Place, Oriented to  Time, Oriented to Situation Alcohol / Substance Use: Not Applicable Psych Involvement: No (comment)  Admission diagnosis:  Cellulitis [L03.90] Cellulitis of both lower extremities [L03.115, L03.116] Venous stasis dermatitis of both lower extremities [I87.2] Bilateral cellulitis of lower leg [L03.116, L03.115] Patient Active Problem List   Diagnosis Date Noted   Bilateral cellulitis of lower leg 06/10/2021   HLD (hyperlipidemia) 06/09/2021   Cellulitis and abscess of leg 12/15/2020   Cellulitis of left lower extremity 12/14/2020   Hypertension    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 - [place], [place] - [address] [place] [address] Phone: [phone] Fax: [phone]     Social Determinants of Health (SDOH) Interventions  Readmission Risk Interventions Readmission Risk Prevention Plan 06/12/2021  Medication Screening Complete  Transportation Screening Complete  Some recent data might be hidden     Final next level of care: Home/Self  Care Barriers to Discharge: Barriers Resolved   Patient Goals and CMS Choice Patient states their goals for this hospitalization and ongoing recovery are:: go home CMS Medicare.gov Compare Post Acute Care list provided to:: Patient Choice offered to / list presented to : Patient  Discharge Placement                       Discharge Plan and Services In-house Referral: Clinical Social Work Discharge Planning Services: Follow-up appt scheduled                                 Social Determinants of Health (SDOH) Interventions     Readmission Risk Interventions Readmission Risk Prevention Plan 06/12/2021  Medication Screening Complete  Transportation Screening Complete  Some recent data might be hidden

## 2021-06-12 NOTE — Discharge Instructions (Addendum)
Please have your leg wrap removed within 1 week by your PCP or your wound care center.    Please go to your wound care center as soon as possible in the next 3-5 days for wound check and leg wrapping.      IMPORTANT INFORMATION: PAY CLOSE ATTENTION   PHYSICIAN DISCHARGE INSTRUCTIONS  Follow with Primary care provider  [facility]  and other consultants as instructed by your Hospitalist Physician  SEEK MEDICAL CARE OR RETURN TO EMERGENCY ROOM IF SYMPTOMS COME BACK, WORSEN OR NEW PROBLEM DEVELOPS   Please note: You were cared for by a hospitalist during your hospital stay. Every effort will be made to forward records to your primary care provider.  You can request that your primary care provider send for your hospital records if they have not received them.  Once you are discharged, your primary care physician will handle any further medical issues. Please note that NO REFILLS for any discharge medications will be authorized once you are discharged, as it is imperative that you return to your primary care physician (or establish a relationship with a primary care physician if you do not have one) for your post hospital discharge needs so that they can reassess your need for medications and monitor your lab values.  Please get a complete blood count and chemistry panel checked by your Primary MD at your next visit, and again as instructed by your Primary MD.  Get Medicines reviewed and adjusted: Please take all your medications with you for your next visit with your Primary MD  Laboratory/radiological data: Please request your Primary MD to go over all hospital tests and procedure/radiological results at the follow up, please ask your primary care provider to get all Hospital records sent to his/her office.  In some cases, they will be blood work, cultures and biopsy results pending at the time of your discharge. Please request that your primary care provider follow  up on these results.  If you are diabetic, please bring your blood sugar readings with you to your follow up appointment with primary care.    Please call and make your follow up appointments as soon as possible.    Also Note the following: If you experience worsening of your admission symptoms, develop shortness of breath, life threatening emergency, suicidal or homicidal thoughts you must seek medical attention immediately by calling 911 or calling your MD immediately  if symptoms less severe.  You must read complete instructions/literature along with all the possible adverse reactions/side effects for all the Medicines you take and that have been prescribed to you. Take any new Medicines after you have completely understood and accpet all the possible adverse reactions/side effects.   Do not drive when taking Pain medications or sleeping medications (Benzodiazepines)  Do not take more than prescribed Pain, Sleep and Anxiety Medications. It is not advisable to combine anxiety,sleep and pain medications without talking with your primary care practitioner  Special Instructions: If you have smoked or chewed Tobacco  in the last 2 yrs please stop smoking, stop any regular Alcohol  and or any Recreational drug use.  Wear Seat belts while driving.  Do not drive if taking any narcotic, mind altering or controlled substances or recreational drugs or alcohol.

## 2021-06-12 NOTE — Consult Note (Signed)
WOC Nurse Consult Note: Patient receiving care in [facility] Reason for Consult: Profore to BLE change today Wound type: venous stasis in BLE gaiter areas, all crusted without s/s of infection. Pressure Injury POA: Yes/No/NA Measurement: Wound bed: crusted Drainage (amount, consistency, odor) none Periwound: intact Dressing procedure/placement/frequency: All wounds covered with Aquacel and package dressings. 4 layer Profore compression wraps applied. Patient tolerated well. These profore wraps can easily stay on a week.  There was not enough drainage on the existing profore wraps to warrant changing today. However, patient is being discharged, and attending requested the compression wraps be changed.  Patient to f/u with [facility] where he is already established. Helmut Muster, RN, MSN, CWOCN, CNS-BC, pager [phone]

## 2021-06-13 LAB — CULTURE, BLOOD (ROUTINE X 2)
Culture: NO GROWTH
Culture: NO GROWTH
Special Requests: ADEQUATE
Special Requests: ADEQUATE

## 2021-06-13 NOTE — Progress Notes (Signed)
[facility] - Clinic Note  06/14/2021     CHIEF COMPLAINT Patient presents for Retina Follow Up   HISTORY OF PRESENT ILLNESS: Kurt [name] is a 70 y.o. male who presents to the clinic today for:  HPI     Retina Follow Up   Patient presents with  CRVO/BRVO.  In right eye.  Duration of 5 weeks.  Since onset it is stable.  I, the attending physician,  performed the HPI with the patient and updated documentation appropriately.        Comments   5 week follow up BRVO OD- Vision stable since last visit OU. Last Thursday, patient was hospitalized for 4 days due to cellulitis in his legs. BS 159 this am A1C between 7-8      Last edited by Rennis Chris, MD on 06/14/2021  5:01 PM.     Pt states he spent 4 days in the hospital for cellulitis in his legs, he is still on antibiotics  Referring physician:  Altamease Oiler, FNP [address] [place],  [place] [address]  HISTORICAL INFORMATION:   Selected notes from the MEDICAL RECORD NUMBER Diabetic Eval per Dr. Karleen Hampshire   CURRENT MEDICATIONS: No current outpatient medications on file. (Ophthalmic Drugs)   No current facility-administered medications for this visit. (Ophthalmic Drugs)   Current Outpatient Medications (Other)  Medication Sig   acetaminophen (TYLENOL) 650 MG CR tablet Take 1,300 mg by mouth every 8 (eight) hours as needed for pain.   aspirin 81 MG chewable tablet Chew 1 tablet (81 mg total) by mouth daily.   doxycycline (VIBRAMYCIN) 100 MG capsule Take 1 capsule (100 mg total) by mouth 2 (two) times daily for 5 days.   glipiZIDE (GLUCOTROL XL) 10 MG 24 hr tablet Take 10 mg by mouth at bedtime.    LANTUS SOLOSTAR 100 UNIT/ML Solostar Pen Inject 34 Units into the skin at bedtime.   lisinopril (PRINIVIL,ZESTRIL) 20 MG tablet Take 20 mg by mouth daily.   methocarbamol (ROBAXIN) 500 MG tablet Take 500 mg by mouth at bedtime as needed for muscle spasms.   oxyCODONE (OXY IR/ROXICODONE) 5 MG  immediate release tablet Take 1 tablet (5 mg total) by mouth every 8 (eight) hours as needed for moderate pain (pain score 4-6).   simvastatin (ZOCOR) 20 MG tablet Take 20 mg by mouth every evening.   SitaGLIPtin-MetFORMIN HCl [phone] MG TB24 Take 1 tablet by mouth at bedtime.   ELIQUIS 2.5 MG TABS tablet Take 2.5 mg by mouth 2 (two) times daily. (Patient not taking: Reported on 06/14/2021)   ONETOUCH ULTRA test strip 1 each 2 (two) times daily.   No current facility-administered medications for this visit. (Other)   REVIEW OF SYSTEMS: ROS   Positive for: Gastrointestinal, Musculoskeletal, Endocrine, Eyes Negative for: Constitutional, Neurological, Skin, Genitourinary, HENT, Cardiovascular, Respiratory, Psychiatric, Allergic/Imm, Heme/Lymph Last edited by Joni Reining, COA on 06/14/2021  1:58 PM.      ALLERGIES Allergies  Allergen Reactions   Augmentin [Amoxicillin-Pot Clavulanate] Nausea And Vomiting   Iodine Hives   Tape Rash    Paper    PAST MEDICAL HISTORY Past Medical History:  Diagnosis Date   Arthritis    Bronchitis    Bronchitis    Cataract    OU   Concussion    late 1990's  after a fall   GERD (gastroesophageal reflux disease)    History of kidney stones    Hypertension    Hypertensive retinopathy  OU   Osteogenesis imperfecta    PONV (postoperative nausea and vomiting)    pt has post polio syndrome   Post-polio syndrome    Type 2 diabetes mellitus (HCC) 12/25/2018   Past Surgical History:  Procedure Laterality Date   ANKLE FRACTURE SURGERY Bilateral    arm surgery Right    nerve surgery   COLONOSCOPY     ELBOW FRACTURE SURGERY Left    EYE MUSCLE SURGERY Left    LEG SURGERY Left    femur fracture with rod   REVERSE SHOULDER ARTHROPLASTY Right 11/10/2019   Procedure: RIGHT REVERSE SHOULDER ARTHROPLASTY;  Surgeon: Cammy Copaean, Gregory Scott, MD;  Location: [facility] OR;  Service: Orthopedics;  Laterality: Right;   TONSILLECTOMY     FAMILY HISTORY Family History   Problem Relation Age of Onset   Hypertension Mother    Diabetes Brother    SOCIAL HISTORY Social History   Tobacco Use   Smoking status: Never   Smokeless tobacco: Never  Vaping Use   Vaping Use: Never used  Substance Use Topics   Alcohol use: Yes    Comment: 1 beer occasionally   Drug use: No         OPHTHALMIC EXAM:  Base Eye Exam     Visual Acuity (Snellen - Linear)       Right Left   Dist cc 20/30 +1 20/25 +2   Dist ph cc 20/20- 20/20-         Tonometry (Tonopen, 2:07 PM)       Right Left   Pressure 12 14         Pupils       Dark Light Shape React APD   Right 3 2 Round Brisk None   Left 3 2 Round Brisk None         Visual Fields (Counting fingers)       Left Right    Full Full         Extraocular Movement       Right Left    Full Full         Neuro/Psych     Oriented x3: Yes   Mood/Affect: Normal         Dilation     Both eyes: 1.0% Mydriacyl, 2.5% Phenylephrine @ 2:07 PM           Slit Lamp and Fundus Exam     Slit Lamp Exam       Right Left   Lids/Lashes Dermatochalasis - upper lid, mild Meibomian gland dysfunction Dermatochalasis - upper lid, Dermatochalasis - lower lid, mild Meibomian gland dysfunction   Conjunctiva/Sclera blue sclera blue sclera   Cornea arcus, 1+ PEE, mild tear film debris arcus, SCH   Anterior Chamber deep and clear deep and clear   Iris round and dilated, no NVI round and dilated, no NVI   Lens 2-3+ NS w/brunescence, 2-3+CS 2-3+ NS w/brunesecence, 2-3+CS   Vitreous mild syneresis, PVD mild syneresis         Fundus Exam       Right Left   Disc pink and sharp pink and sharp   C/D Ratio 0.5 0.4   Macula Good foveal reflex; persistent, focal edema SN macula -- improving, focal exudate superior macula - improving, almost resolved flat, good foveal reflex, mild RPE mottling and clumping, +drusen, No heme or edema   Vessels attenuated, mild tortuousity, mild AV crossing changes mild  attenuation and tortuosity, mild AV crossing changes, mild copper wiring  Periphery attached, no heme attached, no heme           Refraction     Wearing Rx       Sphere Cylinder Axis Add   Right -2.00 +2.25 111 +2.75   Left -1.50 +1.50 094 +2.75           IMAGING AND PROCEDURES  Imaging and Procedures for @TODAY @  OCT, Retina - OU - Both Eyes       Right Eye Quality was good. Central Foveal Thickness: 236. Progression has been stable. Findings include no SRF, intraretinal fluid, retinal drusen , normal foveal contour (Persistent IRF superior macula).   Left Eye Quality was good. Central Foveal Thickness: 259. Progression has been stable. Findings include normal foveal contour, no IRF, no SRF, vitreomacular adhesion , retinal drusen (Focal PED/druse temporal fovea).   Notes *Images captured and stored on drive  Diagnosis / Impression:  OD: BRVO with persistent IRF superior macula OS: NFP, no SRF/IRF, drusen, VMA  Clinical management:  See below  Abbreviations: NFP - Normal foveal profile. CME - cystoid macular edema. PED - pigment epithelial detachment. IRF - intraretinal fluid. SRF - subretinal fluid. EZ - ellipsoid zone. ERM - epiretinal membrane. ORA - outer retinal atrophy. ORT - outer retinal tubulation. SRHM - subretinal hyper-reflective material       Intravitreal Injection, Pharmacologic Agent - OD - Right Eye       Time Out 06/14/2021. 2:34 PM. Confirmed correct patient, procedure, site, and patient consented.   Anesthesia Topical anesthesia was used. Anesthetic medications included Lidocaine 2%, Proparacaine 0.5%.   Procedure Preparation included eyelid speculum, 5% betadine to ocular surface. A (32g) needle was used.   Injection: 2 mg aflibercept 2 MG/0.05ML   Route: Intravitreal, Site: Right Eye   NDC: 06/16/2021, Lot: [id number], Expiration date: 04/15/2022, Waste: 0.05 mL   Post-op Post injection exam found visual acuity of at least  counting fingers. The patient tolerated the procedure well. There were no complications. The patient received written and verbal post procedure care education.               ASSESSMENT/PLAN:    ICD-10-CM   1. Branch retinal vein occlusion of right eye with macular edema  H34.8310 Intravitreal Injection, Pharmacologic Agent - OD - Right Eye    aflibercept (EYLEA) SOLN 2 mg    2. Retinal edema  H35.81 OCT, Retina - OU - Both Eyes    3. Diabetes mellitus type 2 without retinopathy (HCC)  E11.9     4. Essential hypertension  I10     5. Hypertensive retinopathy of both eyes  H35.033     6. Combined forms of age-related cataract of both eyes  H25.813       1,2. BRVO with CME OD  - s/p IVA OD #1 (01.19.21), #2 (02.16.21), #3 (03.16.21), #4 (4.13.21) -- IVA resistance             - s/p IVE OD #1 (05.11.21--sample), #2 (06.08.21), #3 (07.06.21), #4 (8.3.21), #5 (08.31.21), #6 (09.28.21), #7 (10.26.21), #8 (11.23.21), #9 (12.22.21), #10 (1.24.22), #11 (2.22.22), #12 (3.22.22), #13 (04.19.22), #14 (05.25.22)  - BCVA 20/20 OD  - exam with focal edema, IRH, CWS superonasal macula -- improving  - OCT shows mild interval improvement in IRF SN macula -- non-central  - recommend IVE OD #16 today, 6.29.22  - RBA of procedure discussed, questions answered  - informed consent obtained  - planning for focal laser once edema is reduced significantly  -  Avastin informed consent form signed and scanned on 01.19.21  - Eylea informed consent form signed and scanned on 05.11.21  - see procedure note  - Eylea4U benefits investigation started, 05.11.21 -- approved through [employer] through 12.31.22  - F/U 2-3 weeks -- DFE/OCT/focal laser OD  3. Diabetes mellitus, type 2 without retinopathy OU  - The incidence, risk factors for progression, natural history and treatment options for diabetic retinopathy  were discussed with patient.    - The need for close monitoring of blood glucose, blood pressure,  and serum lipids, avoiding cigarette or any type of tobacco, and the need for long term follow up was also discussed with patient.  - f/u in 1 year, sooner prn  4,5. Hypertensive retinopathy OU  - discussed importance of tight BP control  - monitor  6. Age related cataracts OU   - The symptoms of cataract, surgical options, and treatments and risks were discussed with patient.  - discussed diagnosis and progression  - not yet visually significant  - monitor for now  Ophthalmic Meds Ordered this visit:  Meds ordered this encounter  Medications   aflibercept (EYLEA) SOLN 2 mg       Return for f/u 2-3 weeks, BRVO OD, DFE, OCT, focal laser OD.  There are no Patient Instructions on file for this visit.  This document serves as a record of services personally performed by Karie Chimera, MD, PhD. It was created on their behalf by Annalee Genta, COMT. The creation of this record is the provider's dictation and/or activities during the visit.  Electronically signed by: Annalee Genta, COMT 06/14/21 5:03 PM  Karie Chimera, M.D., Ph.D. Diseases & Surgery of the Retina and Vitreous [facility]  I have reviewed the above documentation for accuracy and completeness, and I agree with the above. Karie Chimera, M.D., Ph.D. 06/14/21 5:03 PM  Abbreviations: M myopia (nearsighted); A astigmatism; H hyperopia (farsighted); P presbyopia; Mrx spectacle prescription;  CTL contact lenses; OD right eye; OS left eye; OU both eyes  XT exotropia; ET esotropia; PEK punctate epithelial keratitis; PEE punctate epithelial erosions; DES dry eye syndrome; MGD meibomian gland dysfunction; ATs artificial tears; PFAT's preservative free artificial tears; NSC nuclear sclerotic cataract; PSC posterior subcapsular cataract; ERM epi-retinal membrane; PVD posterior vitreous detachment; RD retinal detachment; DM diabetes mellitus; DR diabetic retinopathy; NPDR non-proliferative diabetic  retinopathy; PDR proliferative diabetic retinopathy; CSME clinically significant macular edema; DME diabetic macular edema; dbh dot blot hemorrhages; CWS cotton wool spot; POAG primary open angle glaucoma; C/D cup-to-disc ratio; HVF humphrey visual field; GVF goldmann visual field; OCT optical coherence tomography; IOP intraocular pressure; BRVO Branch retinal vein occlusion; CRVO central retinal vein occlusion; CRAO central retinal artery occlusion; BRAO branch retinal artery occlusion; RT retinal tear; SB scleral buckle; PPV pars plana vitrectomy; VH Vitreous hemorrhage; PRP panretinal laser photocoagulation; IVK intravitreal kenalog; VMT vitreomacular traction; MH Macular hole;  NVD neovascularization of the disc; NVE neovascularization elsewhere; AREDS age related eye disease study; ARMD age related macular degeneration; POAG primary open angle glaucoma; EBMD epithelial/anterior basement membrane dystrophy; ACIOL anterior chamber intraocular lens; IOL intraocular lens; PCIOL posterior chamber intraocular lens; Phaco/IOL phacoemulsification with intraocular lens placement; PRK photorefractive keratectomy; LASIK laser assisted in situ keratomileusis; HTN hypertension; DM diabetes mellitus; COPD chronic obstructive pulmonary disease

## 2021-06-14 ENCOUNTER — Other Ambulatory Visit: Payer: Self-pay

## 2021-06-14 ENCOUNTER — Encounter (INDEPENDENT_AMBULATORY_CARE_PROVIDER_SITE_OTHER): Payer: Self-pay | Admitting: Ophthalmology

## 2021-06-14 ENCOUNTER — Ambulatory Visit (INDEPENDENT_AMBULATORY_CARE_PROVIDER_SITE_OTHER): Payer: Medicare Other | Admitting: Ophthalmology

## 2021-06-14 DIAGNOSIS — H25813 Combined forms of age-related cataract, bilateral: Secondary | ICD-10-CM

## 2021-06-14 DIAGNOSIS — E119 Type 2 diabetes mellitus without complications: Secondary | ICD-10-CM

## 2021-06-14 DIAGNOSIS — I10 Essential (primary) hypertension: Secondary | ICD-10-CM | POA: Diagnosis not present

## 2021-06-14 DIAGNOSIS — H34831 Tributary (branch) retinal vein occlusion, right eye, with macular edema: Secondary | ICD-10-CM

## 2021-06-14 DIAGNOSIS — H3581 Retinal edema: Secondary | ICD-10-CM | POA: Diagnosis not present

## 2021-06-14 DIAGNOSIS — H35033 Hypertensive retinopathy, bilateral: Secondary | ICD-10-CM

## 2021-06-14 MED ORDER — AFLIBERCEPT 2MG/0.05ML IZ SOLN FOR KALEIDOSCOPE
2.0000 mg | INTRAVITREAL | Status: AC | PRN
  Administered 2021-06-14: 2 mg via INTRAVITREAL

## 2021-06-22 ENCOUNTER — Other Ambulatory Visit: Payer: Self-pay

## 2021-06-22 ENCOUNTER — Encounter (HOSPITAL_COMMUNITY): Payer: Self-pay | Admitting: Physical Therapy

## 2021-06-22 ENCOUNTER — Ambulatory Visit (HOSPITAL_COMMUNITY): Payer: Medicare Other | Attending: Family Medicine | Admitting: Physical Therapy

## 2021-06-22 DIAGNOSIS — I872 Venous insufficiency (chronic) (peripheral): Secondary | ICD-10-CM | POA: Insufficient documentation

## 2021-06-22 DIAGNOSIS — R600 Localized edema: Secondary | ICD-10-CM | POA: Insufficient documentation

## 2021-06-22 DIAGNOSIS — S81801A Unspecified open wound, right lower leg, initial encounter: Secondary | ICD-10-CM | POA: Insufficient documentation

## 2021-06-22 DIAGNOSIS — R2689 Other abnormalities of gait and mobility: Secondary | ICD-10-CM | POA: Diagnosis present

## 2021-06-22 DIAGNOSIS — S81802A Unspecified open wound, left lower leg, initial encounter: Secondary | ICD-10-CM | POA: Insufficient documentation

## 2021-06-22 NOTE — Therapy (Signed)
[facility] [address] [place], [place], [address] Phone: [phone]   Fax:  [phone]  Wound Care Evaluation  Patient Details  Name: Kurt [name] MRN: [id number] Date of Birth: Nov 16, 1951 No data recorded  Encounter Date: 06/22/2021   PT End of Session - 06/22/21 1016     Visit Number 1    Number of Visits 8    Date for PT Re-Evaluation 07/20/21    Authorization Type UHC medicare (no visit limit, no auth)    PT Start Time 0915    PT Stop Time 1010    PT Time Calculation (min) 55 min    Activity Tolerance Patient tolerated treatment well    Behavior During Therapy [facility] for tasks assessed/performed             Past Medical History:  Diagnosis Date   Arthritis    Bronchitis    Bronchitis    Cataract    OU   Concussion    late 1990's  after a fall   GERD (gastroesophageal reflux disease)    History of kidney stones    Hypertension    Hypertensive retinopathy    OU   Osteogenesis imperfecta    PONV (postoperative nausea and vomiting)    pt has post polio syndrome   Post-polio syndrome    Type 2 diabetes mellitus (HCC) 12/25/2018    Past Surgical History:  Procedure Laterality Date   ANKLE FRACTURE SURGERY Bilateral    arm surgery Right    nerve surgery   COLONOSCOPY     ELBOW FRACTURE SURGERY Left    EYE MUSCLE SURGERY Left    LEG SURGERY Left    femur fracture with rod   REVERSE SHOULDER ARTHROPLASTY Right 11/10/2019   Procedure: RIGHT REVERSE SHOULDER ARTHROPLASTY;  Surgeon: Cammy Copa, MD;  Location: [facility] OR;  Service: Orthopedics;  Laterality: Right;   TONSILLECTOMY      There were no vitals filed for this visit.      Wound Therapy - 06/22/21 0001     Subjective Patient is a 70 y.o. male who has been referred to physical therapy for wound therapy for management of bilateral venous stasis dermatitis. Patient with mixed arterial and venous insufficiency shown in recent ABI. Patient has had  issues with wounds in the past and does have compression garments. Legs have been painful with the compression wraps he arrived with.    Patient and Family Stated Goals legs to heal    Date of Onset 06/08/21    Prior Treatments compression wraps    Pain Scale 0-10    Pain Score 6     Pain Type Acute pain    Pain Location Leg    Pain Orientation Right;Left;Lower    Evaluation and Treatment Procedures Explained to Patient/Family Yes    Evaluation and Treatment Procedures agreed to    Wound Properties Date First Assessed: 06/09/21 Time First Assessed: 1034 Wound Type: Venous stasis ulcer Location: Ankle Location Orientation: Distal;Left;Medial Present on Admission: Yes Final Assessment Date: 06/22/21   Wound Properties Date First Assessed: 06/09/21 Time First Assessed: 1035 Wound Type: Venous stasis ulcer Location: Ankle Location Orientation: Right;Distal;Medial Present on Admission: Yes Final Assessment Date: 06/22/21   Wound Properties Date First Assessed: 06/22/21 Time First Assessed: 0915 Wound Type: Venous stasis ulcer Location: Pretibial Location Orientation: Right Wound Description (Comments): scattered LE scabbing Present on Admission: Yes   Wound Image Images linked: 2    Dressing Type  Compression wrap    Dressing Changed Changed    Dressing Status Dry;Intact;Old drainage    Dressing Change Frequency PRN    Site / Wound Assessment Painful;Red   dry skin, scabbing   Peri-wound Assessment Erythema (blanchable);Edema    Wound Properties Date First Assessed: 06/22/21 Time First Assessed: 0915 Wound Type: Venous stasis ulcer Location: Pretibial Location Orientation: Left Wound Description (Comments): scattered LE scabbing Present on Admission: Yes   Wound Image Images linked: 2    Dressing Type Compression wrap    Dressing Changed Changed    Dressing Status Dry;Intact;Old drainage    Dressing Change Frequency PRN    Site / Wound Assessment Red;Painful;Dry   scabbing throughout LE    Peri-wound Assessment Erythema (blanchable);Edema    Drainage Amount None    Wound Therapy - Clinical Statement Patient presents with mixed arterial and venous insufficiency likely contributing to LE skin integrity. Patient arrives with profore system on resulting in severe LE pain likely due to increased compression with arterial insufficiency. Patient with defuse scabbing throughout bilateral LE, as seen in photos, but without open wounds present. Cleansed bilateral LE, moisturized with lotion and Vaseline, and wrapped with kerlix and coban with minimal stretch. Patient tolerates well and states legs feel better with new wraps. Patient educated on bringing compression garments next time to see if he is able to don/doff independently if no wounds present. Patient will continue to benefit from skilled physical therapy in order to promote healthy skin integrity.    Wound Therapy - Functional Problem List bathing, walking    Factors Delaying/Impairing Wound Healing Diabetes Mellitus;Altered sensation;Multiple medical problems;Vascular compromise    Wound Therapy - Frequency 2X / week    Wound Plan continue with dressing changes and debride if necessary, transition to self management when able    Dressing  lotion, vaseline, kerlix, coban with minimal stretch, #5 netting                   Objective measurements completed on examination: See above findings.             PT Education - 06/22/21 1015     Education Details Patient educated on exam findings, POC, changing wraps if soiled, ankle pumps/toe crunches for HEP, taking wraps off if painful, compression garments    Person(s) Educated Patient    Methods Explanation    Comprehension Verbalized understanding              PT Short Term Goals - 06/22/21 1024       PT SHORT TERM GOAL #1   Title Patient will be able to don/doff proper compression if indicated to reduce LE edema    Time 2    Period Weeks    Status New     Target Date 07/06/21      PT SHORT TERM GOAL #2   Title Pt to verbalize the importance of cleansing and moisturizing LE on a daily basis.    Time 2    Period Weeks    Status New    Target Date 07/06/21               PT Long Term Goals - 06/22/21 1025       PT LONG TERM GOAL #1   Title All wounds to be healed to allow pt to be able to don socks and pants with increased comfort.    Time 4    Period Weeks    Status New  Target Date 07/20/21      PT LONG TERM GOAL #2   Title Patient will be able to independently manage bilateral LE symptoms.    Time 4    Period Weeks    Status New    Target Date 07/20/21                  Plan - 06/22/21 1018     Clinical Impression Statement see above    Personal Factors and Comorbidities Age;Fitness;Comorbidity 3+;Time since onset of injury/illness/exacerbation    Comorbidities Venous and arterial insufficiency, diabetes, post polio syndrome, osteogenesis imperfecta, several bilateral LE ortho injuries/surgeries    Examination-Activity Limitations Locomotion Level;Transfers;Stand;Stairs;Hygiene/Grooming    Examination-Participation Restrictions Meal Prep;Cleaning;Volunteer;Yard Work;Shop    Stability/Clinical Decision Making Stable/Uncomplicated    Clinical Decision Making Low    Rehab Potential Good    PT Frequency 2x / week    PT Duration 4 weeks    PT Treatment/Interventions ADLs/Self Care Home Management;Electrical Stimulation;Ultrasound;DME Instruction;Gait training;Stair training;Therapeutic exercise;Neuromuscular re-education;Patient/family education;Splinting;Taping;Passive range of motion;Compression bandaging    PT Next Visit Plan dressing changes and debridment if nessessary to promote tissue healing and reduce risk of infection    PT Home Exercise Plan ankle pumps, toe crunches    Consulted and Agree with Plan of Care Patient             Patient will benefit from skilled therapeutic intervention in order to  improve the following deficits and impairments:  Abnormal gait, Difficulty walking, Decreased skin integrity, Decreased mobility, Increased edema  Visit Diagnosis: Venous stasis dermatitis of both lower extremities  Other abnormalities of gait and mobility  Localized edema    Problem List Patient Active Problem List   Diagnosis Date Noted   Bilateral cellulitis of lower leg 06/10/2021   HLD (hyperlipidemia) 06/09/2021   Cellulitis and abscess of leg 12/15/2020   Cellulitis of left lower extremity 12/14/2020   Hypertension    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   Cellulitis 02/24/2019   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    [facility] [address] [place], [place], [address] Phone: [phone]   Fax:  [phone]  Name: Kurt [name] MRN: [id number] Date of Birth: 24-Mar-1951

## 2021-06-23 ENCOUNTER — Ambulatory Visit (HOSPITAL_COMMUNITY): Payer: Medicare Other | Admitting: Physical Therapy

## 2021-06-26 ENCOUNTER — Ambulatory Visit (HOSPITAL_COMMUNITY): Payer: Medicare Other | Admitting: Physical Therapy

## 2021-06-26 ENCOUNTER — Encounter (HOSPITAL_COMMUNITY): Payer: Self-pay | Admitting: Physical Therapy

## 2021-06-26 ENCOUNTER — Other Ambulatory Visit: Payer: Self-pay

## 2021-06-26 DIAGNOSIS — S81802A Unspecified open wound, left lower leg, initial encounter: Secondary | ICD-10-CM

## 2021-06-26 DIAGNOSIS — S81801A Unspecified open wound, right lower leg, initial encounter: Secondary | ICD-10-CM

## 2021-06-26 DIAGNOSIS — I872 Venous insufficiency (chronic) (peripheral): Secondary | ICD-10-CM

## 2021-06-26 DIAGNOSIS — R2689 Other abnormalities of gait and mobility: Secondary | ICD-10-CM

## 2021-06-26 DIAGNOSIS — R600 Localized edema: Secondary | ICD-10-CM

## 2021-06-26 NOTE — Therapy (Signed)
[facility] [address] [place], [place], [address] Phone: [phone]   Fax:  [phone]  Wound Care Therapy  Patient Details  Name: Kurt [name] MRN: [id number] Date of Birth: Oct 14, 1951 No data recorded  Encounter Date: 06/26/2021   PT End of Session - 06/26/21 0826     Visit Number 2    Number of Visits 8    Date for PT Re-Evaluation 07/20/21    Authorization Type UHC medicare (no visit limit, no auth)    PT Start Time 0827    PT Stop Time 0908    PT Time Calculation (min) 41 min    Activity Tolerance Patient tolerated treatment well    Behavior During Therapy [facility] for tasks assessed/performed             Past Medical History:  Diagnosis Date   Arthritis    Bronchitis    Bronchitis    Cataract    OU   Concussion    late 1990's  after a fall   GERD (gastroesophageal reflux disease)    History of kidney stones    Hypertension    Hypertensive retinopathy    OU   Osteogenesis imperfecta    PONV (postoperative nausea and vomiting)    pt has post polio syndrome   Post-polio syndrome    Type 2 diabetes mellitus (HCC) 12/25/2018    Past Surgical History:  Procedure Laterality Date   ANKLE FRACTURE SURGERY Bilateral    arm surgery Right    nerve surgery   COLONOSCOPY     ELBOW FRACTURE SURGERY Left    EYE MUSCLE SURGERY Left    LEG SURGERY Left    femur fracture with rod   REVERSE SHOULDER ARTHROPLASTY Right 11/10/2019   Procedure: RIGHT REVERSE SHOULDER ARTHROPLASTY;  Surgeon: Cammy Copa, MD;  Location: [facility] OR;  Service: Orthopedics;  Laterality: Right;   TONSILLECTOMY      There were no vitals filed for this visit.               Wound Therapy - 06/26/21 0001     Subjective Reports slight achiness in legs. States dressings slid down. He remembered to bring his compression garments    Patient and Family Stated Goals legs to heal    Date of Onset 06/08/21    Prior Treatments  compression wraps    Pain Scale 0-10    Pain Score 3     Pain Type Acute pain    Pain Location Leg    Pain Orientation Right;Left;Lower    Evaluation and Treatment Procedures Explained to Patient/Family Yes    Evaluation and Treatment Procedures agreed to    Wound Properties Date First Assessed: 06/22/21 Time First Assessed: 0915 Wound Type: Venous stasis ulcer Location: Pretibial Location Orientation: Right Wound Description (Comments): scattered LE scabbing Present on Admission: Yes   Dressing Type Compression wrap    Dressing Changed Changed    Dressing Status Dry;Old drainage    Dressing Change Frequency PRN    Site / Wound Assessment Red    Peri-wound Assessment Erythema (blanchable)    Treatment Cleansed    Wound Properties Date First Assessed: 06/22/21 Time First Assessed: 0915 Wound Type: Venous stasis ulcer Location: Pretibial Location Orientation: Left Wound Description (Comments): scattered LE scabbing Present on Admission: Yes   Dressing Type Compression wrap    Dressing Changed Changed    Dressing Status Dry;Old drainage    Dressing Change Frequency PRN  Site / Wound Assessment Red    Peri-wound Assessment Erythema (blanchable);Edema    Treatment Cleansed;Debridement (Selective)    Wound Therapy - Clinical Statement Right leg with no wounds or dry skin noted on this date after cleansing. Able to donn compression gfarment on right but this was challengin. Unable to use butler secondary to mobility defecits at baseline secondary to history of polio that affected his right side. Discussed possible benefit from reduced compression for compression garments to improve ease of donning. Will discuss this next session. Continued with vaseoline and wrap on left secondary to continued adhereent areas. Anticipate this to be cleared up by next session and pending presentation will discharge to self care. Will review compression garments next session.    Wound Therapy - Functional  Problem List bathing, walking    Factors Delaying/Impairing Wound Healing Diabetes Mellitus;Altered sensation;Multiple medical problems;Vascular compromise    Wound Therapy - Frequency 2X / week    Wound Plan continue with dressing changes and debride if necessary, transition to self management when able- discuss reduced compression for compression garments.    Dressing  lotion, vaseline, kerlix, coban with minimal stretch, #5 netting on left, compression garment on right                       PT Short Term Goals - 06/22/21 1024       PT SHORT TERM GOAL #1   Title Patient will be able to don/doff proper compression if indicated to reduce LE edema    Time 2    Period Weeks    Status New    Target Date 07/06/21      PT SHORT TERM GOAL #2   Title Pt to verbalize the importance of cleansing and moisturizing LE on a daily basis.    Time 2    Period Weeks    Status New    Target Date 07/06/21               PT Long Term Goals - 06/22/21 1025       PT LONG TERM GOAL #1   Title All wounds to be healed to allow pt to be able to don socks and pants with increased comfort.    Time 4    Period Weeks    Status New    Target Date 07/20/21      PT LONG TERM GOAL #2   Title Patient will be able to independently manage bilateral LE symptoms.    Time 4    Period Weeks    Status New    Target Date 07/20/21                    Patient will benefit from skilled therapeutic intervention in order to improve the following deficits and impairments:     Visit Diagnosis: Venous stasis dermatitis of both lower extremities  Localized edema  Other abnormalities of gait and mobility  Multiple open wounds of right lower leg  Multiple open wounds of left lower leg     Problem List Patient Active Problem List   Diagnosis Date Noted   Bilateral cellulitis of lower leg 06/10/2021   HLD (hyperlipidemia) 06/09/2021   Cellulitis and abscess of leg 12/15/2020    Cellulitis of left lower extremity 12/14/2020   Hypertension    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

## 2021-06-27 NOTE — Progress Notes (Signed)
[facility] - Clinic Note  06/28/2021     CHIEF COMPLAINT Patient presents for Retina Follow Up   HISTORY OF PRESENT ILLNESS: Kurt [name] is a 70 y.o. male who presents to the clinic today for:  HPI     Retina Follow Up   Patient presents with  CRVO/BRVO.  In right eye.  This started weeks ago.  Severity is moderate.  Duration of weeks.  Since onset it is stable.  I, the attending physician,  performed the HPI with the patient and updated documentation appropriately.        Comments   Pt states vision seems the same OU.  Pt denies eye pain or discomfort and denies any new or worsening floaters or fol OU.      Last edited by Rennis Chris, MD on 06/28/2021  2:23 PM.     Pt states   Referring physician:  [facility] [address] [place],  [place] [address]  HISTORICAL INFORMATION:   Selected notes from the MEDICAL RECORD NUMBER Diabetic Eval per Dr. Karleen Hampshire   CURRENT MEDICATIONS: Current Outpatient Medications (Ophthalmic Drugs)  Medication Sig   prednisoLONE acetate (PRED FORTE) 1 % ophthalmic suspension Place 1 drop into the right eye 4 (four) times daily for 7 days.   No current facility-administered medications for this visit. (Ophthalmic Drugs)   Current Outpatient Medications (Other)  Medication Sig   acetaminophen (TYLENOL) 650 MG CR tablet Take 1,300 mg by mouth every 8 (eight) hours as needed for pain.   aspirin 81 MG chewable tablet Chew 1 tablet (81 mg total) by mouth daily.   ELIQUIS 2.5 MG TABS tablet Take 2.5 mg by mouth 2 (two) times daily. (Patient not taking: Reported on 06/14/2021)   glipiZIDE (GLUCOTROL XL) 10 MG 24 hr tablet Take 10 mg by mouth at bedtime.    LANTUS SOLOSTAR 100 UNIT/ML Solostar Pen Inject 34 Units into the skin at bedtime.   lisinopril (PRINIVIL,ZESTRIL) 20 MG tablet Take 20 mg by mouth daily.   methocarbamol (ROBAXIN) 500 MG tablet Take 500 mg by mouth at bedtime as needed for muscle  spasms.   ONETOUCH ULTRA test strip 1 each 2 (two) times daily.   oxyCODONE (OXY IR/ROXICODONE) 5 MG immediate release tablet Take 1 tablet (5 mg total) by mouth every 8 (eight) hours as needed for moderate pain (pain score 4-6).   simvastatin (ZOCOR) 20 MG tablet Take 20 mg by mouth every evening.   SitaGLIPtin-MetFORMIN HCl [phone] MG TB24 Take 1 tablet by mouth at bedtime.   No current facility-administered medications for this visit. (Other)   REVIEW OF SYSTEMS: ROS   Positive for: Gastrointestinal, Musculoskeletal, Endocrine, Eyes Negative for: Constitutional, Neurological, Skin, Genitourinary, HENT, Cardiovascular, Respiratory, Psychiatric, Allergic/Imm, Heme/Lymph Last edited by Corrinne Eagle on 06/28/2021  1:34 PM.       ALLERGIES Allergies  Allergen Reactions   Augmentin [Amoxicillin-Pot Clavulanate] Nausea And Vomiting   Iodine Hives   Tape Rash    Paper    PAST MEDICAL HISTORY Past Medical History:  Diagnosis Date   Arthritis    Bronchitis    Bronchitis    Cataract    OU   Concussion    late 1990's  after a fall   GERD (gastroesophageal reflux disease)    History of kidney stones    Hypertension    Hypertensive retinopathy    OU   Osteogenesis imperfecta    PONV (postoperative nausea and  vomiting)    pt has post polio syndrome   Post-polio syndrome    Type 2 diabetes mellitus (HCC) 12/25/2018   Past Surgical History:  Procedure Laterality Date   ANKLE FRACTURE SURGERY Bilateral    arm surgery Right    nerve surgery   COLONOSCOPY     ELBOW FRACTURE SURGERY Left    EYE MUSCLE SURGERY Left    LEG SURGERY Left    femur fracture with rod   REVERSE SHOULDER ARTHROPLASTY Right 11/10/2019   Procedure: RIGHT REVERSE SHOULDER ARTHROPLASTY;  Surgeon: Cammy Copa, MD;  Location: [facility] OR;  Service: Orthopedics;  Laterality: Right;   TONSILLECTOMY     FAMILY HISTORY Family History  Problem Relation Age of Onset   Hypertension Mother    Diabetes  Brother    SOCIAL HISTORY Social History   Tobacco Use   Smoking status: Never   Smokeless tobacco: Never  Vaping Use   Vaping Use: Never used  Substance Use Topics   Alcohol use: Yes    Comment: 1 beer occasionally   Drug use: No         OPHTHALMIC EXAM:  Base Eye Exam     Visual Acuity (Snellen - Linear)       Right Left   Dist cc 20/30 +2 20/20 -   Dist ph cc 20/20 -     Correction: Glasses         Tonometry (Tonopen, 2:09 PM)       Right Left   Pressure 14 15         Pupils       Dark Light Shape React APD   Right 3 2 Round Brisk 0   Left 3 2 Round Brisk 0         Visual Fields       Left Right    Full Full         Extraocular Movement       Right Left    Full Full         Neuro/Psych     Oriented x3: Yes   Mood/Affect: Normal         Dilation     Both eyes: 1.0% Mydriacyl, 2.5% Phenylephrine @ 2:09 PM           Slit Lamp and Fundus Exam     Slit Lamp Exam       Right Left   Lids/Lashes Dermatochalasis - upper lid, mild Meibomian gland dysfunction Dermatochalasis - upper lid, Dermatochalasis - lower lid, mild Meibomian gland dysfunction   Conjunctiva/Sclera blue sclera blue sclera   Cornea arcus, 1+ PEE, mild tear film debris arcus, SCH   Anterior Chamber deep and clear deep and clear   Iris round and dilated, no NVI round and dilated, no NVI   Lens 2-3+ NS w/brunescence, 2-3+CS 2-3+ NS w/brunesecence, 2-3+CS   Vitreous mild syneresis, PVD mild syneresis         Fundus Exam       Right Left   Disc pink and sharp pink and sharp   C/D Ratio 0.5 0.4   Macula Good foveal reflex; persistent, focal edema SN macula -- improving, focal exudate superior macula - improving, almost resolved flat, good foveal reflex, mild RPE mottling and clumping, +drusen, No heme or edema   Vessels attenuated, mild tortuousity, mild AV crossing changes mild attenuation and tortuosity, mild AV crossing changes, mild copper wiring    Periphery attached, no heme attached, no  heme           Refraction     Wearing Rx       Sphere Cylinder Axis Add   Right -2.00 +2.25 111 +2.75   Left -1.50 +1.50 094 +2.75           IMAGING AND PROCEDURES  Imaging and Procedures for  OCT, Retina - OU - Both Eyes       Right Eye Quality was good. Central Foveal Thickness: 235. Progression has improved. Findings include no SRF, intraretinal fluid, retinal drusen , normal foveal contour (Mild interval improvement in IRF/edema superior macula).   Left Eye Quality was good. Central Foveal Thickness: 257. Progression has been stable. Findings include normal foveal contour, no IRF, no SRF, vitreomacular adhesion , retinal drusen (Focal PED/druse temporal fovea).   Notes *Images captured and stored on drive  Diagnosis / Impression:  OD: BRVO with persistent IRF superior macula -- mild interval improvement OS: NFP, no SRF/IRF, drusen, VMA  Clinical management:  See below  Abbreviations: NFP - Normal foveal profile. CME - cystoid macular edema. PED - pigment epithelial detachment. IRF - intraretinal fluid. SRF - subretinal fluid. EZ - ellipsoid zone. ERM - epiretinal membrane. ORA - outer retinal atrophy. ORT - outer retinal tubulation. SRHM - subretinal hyper-reflective material       Focal Laser - OD - Right Eye       LASER PROCEDURE NOTE  Diagnosis:   BRVO w/ macular edema, right eye  Procedure:  Focal laser photocoagulation using slit lamp laser, right eye  Anesthesia:  Topical  Surgeon: Rennis Chris, MD, PhD   Informed consent obtained, operative eye marked, and time out performed prior to initiation of laser.   Lumenis [id number] Focal/Grid laser Power: 100 mW Duration: 50 msec  Spot size: 100 microns  # spots: 79 spots placed within bed of edema superonasal macula.  Complications: None.  RTC: 2 wks for DFE/OCT, possible injection  Patient tolerated the procedure well and received written and  verbal post-procedure care information/education.             ASSESSMENT/PLAN:    ICD-10-CM   1. Branch retinal vein occlusion of right eye with macular edema  H34.8310 Focal Laser - OD - Right Eye    2. Retinal edema  H35.81 OCT, Retina - OU - Both Eyes    3. Diabetes mellitus type 2 without retinopathy (HCC)  E11.9     4. Essential hypertension  I10     5. Hypertensive retinopathy of both eyes  H35.033     6. Combined forms of age-related cataract of both eyes  H25.813      1,2. BRVO with CME OD  - s/p IVA OD #1 (01.19.21), #2 (02.16.21), #3 (03.16.21), #4 (4.13.21) -- IVA resistance             - s/p IVE OD #1 (05.11.21--sample), #2 (06.08.21), #3 (07.06.21), #4 (8.3.21), #5 (08.31.21), #6 (09.28.21), #7 (10.26.21), #8 (11.23.21), #9 (12.22.21), #10 (1.24.22), #11 (2.22.22), #12 (3.22.22), #13 (04.19.22), #14 (05.25.22), #15 (06.29.22)  - BCVA 20/20 OD  - exam with focal edema, IRH, CWS superonasal macula -- improving  - OCT shows mild interval improvement in IRF SN macula -- non-central  - recommend focal laser OD today, 07.13.22  - pt wishes to proceed with laser  - RBA of procedure discussed, questions answered  - informed consent obtained  - Avastin informed consent form signed and scanned on 01.19.21  - Eylea informed consent form  signed and scanned on 05.11.21  - see procedure note  - Eylea4U benefits investigation started, 05.11.21 -- approved through [employer] through 12.31.22  - F/U July 27 or later -- DFE/OCT/possible injections  3. Diabetes mellitus, type 2 without retinopathy OU  - The incidence, risk factors for progression, natural history and treatment options for diabetic retinopathy  were discussed with patient.    - The need for close monitoring of blood glucose, blood pressure, and serum lipids, avoiding cigarette or any type of tobacco, and the need for long term follow up was also discussed with patient.  - f/u in 1 year, sooner prn  4,5.  Hypertensive retinopathy OU  - discussed importance of tight BP control  - monitor  6. Age related cataracts OU   - The symptoms of cataract, surgical options, and treatments and risks were discussed with patient.  - discussed diagnosis and progression  - not yet visually significant  - monitor for now  Ophthalmic Meds Ordered this visit:  Meds ordered this encounter  Medications   prednisoLONE acetate (PRED FORTE) 1 % ophthalmic suspension    Sig: Place 1 drop into the right eye 4 (four) times daily for 7 days.    Dispense:  10 mL    Refill:  0        Return for f/u July 27 or later, BRVO OD, DFE, OCT.  There are no Patient Instructions on file for this visit.  This document serves as a record of services personally performed by Karie ChimeraBrian G. Annelie Boak, MD, PhD. It was created on their behalf by Annalee Gentaaryl Barber, COMT. The creation of this record is the provider's dictation and/or activities during the visit.  Electronically signed by: Annalee Gentaaryl Barber, COMT 06/28/21 2:31 PM  Karie ChimeraBrian G. Garet Hooton, M.D., Ph.D. Diseases & Surgery of the Retina and Vitreous [facility]  I have reviewed the above documentation for accuracy and completeness, and I agree with the above. Karie ChimeraBrian G. Lathaniel Legate, M.D., Ph.D. 06/28/21 2:31 PM  Abbreviations: M myopia (nearsighted); A astigmatism; H hyperopia (farsighted); P presbyopia; Mrx spectacle prescription;  CTL contact lenses; OD right eye; OS left eye; OU both eyes  XT exotropia; ET esotropia; PEK punctate epithelial keratitis; PEE punctate epithelial erosions; DES dry eye syndrome; MGD meibomian gland dysfunction; ATs artificial tears; PFAT's preservative free artificial tears; NSC nuclear sclerotic cataract; PSC posterior subcapsular cataract; ERM epi-retinal membrane; PVD posterior vitreous detachment; RD retinal detachment; DM diabetes mellitus; DR diabetic retinopathy; NPDR non-proliferative diabetic retinopathy; PDR proliferative diabetic  retinopathy; CSME clinically significant macular edema; DME diabetic macular edema; dbh dot blot hemorrhages; CWS cotton wool spot; POAG primary open angle glaucoma; C/D cup-to-disc ratio; HVF humphrey visual field; GVF goldmann visual field; OCT optical coherence tomography; IOP intraocular pressure; BRVO Branch retinal vein occlusion; CRVO central retinal vein occlusion; CRAO central retinal artery occlusion; BRAO branch retinal artery occlusion; RT retinal tear; SB scleral buckle; PPV pars plana vitrectomy; VH Vitreous hemorrhage; PRP panretinal laser photocoagulation; IVK intravitreal kenalog; VMT vitreomacular traction; MH Macular hole;  NVD neovascularization of the disc; NVE neovascularization elsewhere; AREDS age related eye disease study; ARMD age related macular degeneration; POAG primary open angle glaucoma; EBMD epithelial/anterior basement membrane dystrophy; ACIOL anterior chamber intraocular lens; IOL intraocular lens; PCIOL posterior chamber intraocular lens; Phaco/IOL phacoemulsification with intraocular lens placement; PRK photorefractive keratectomy; LASIK laser assisted in situ keratomileusis; HTN hypertension; DM diabetes mellitus; COPD chronic obstructive pulmonary disease

## 2021-06-28 ENCOUNTER — Other Ambulatory Visit: Payer: Self-pay

## 2021-06-28 ENCOUNTER — Encounter (INDEPENDENT_AMBULATORY_CARE_PROVIDER_SITE_OTHER): Payer: Self-pay | Admitting: Ophthalmology

## 2021-06-28 ENCOUNTER — Ambulatory Visit (HOSPITAL_COMMUNITY): Payer: Medicare Other

## 2021-06-28 ENCOUNTER — Ambulatory Visit (INDEPENDENT_AMBULATORY_CARE_PROVIDER_SITE_OTHER): Payer: Medicare Other | Admitting: Ophthalmology

## 2021-06-28 DIAGNOSIS — H34831 Tributary (branch) retinal vein occlusion, right eye, with macular edema: Secondary | ICD-10-CM

## 2021-06-28 DIAGNOSIS — I872 Venous insufficiency (chronic) (peripheral): Secondary | ICD-10-CM | POA: Diagnosis not present

## 2021-06-28 DIAGNOSIS — H25813 Combined forms of age-related cataract, bilateral: Secondary | ICD-10-CM

## 2021-06-28 DIAGNOSIS — E119 Type 2 diabetes mellitus without complications: Secondary | ICD-10-CM | POA: Diagnosis not present

## 2021-06-28 DIAGNOSIS — R600 Localized edema: Secondary | ICD-10-CM

## 2021-06-28 DIAGNOSIS — I10 Essential (primary) hypertension: Secondary | ICD-10-CM

## 2021-06-28 DIAGNOSIS — H35033 Hypertensive retinopathy, bilateral: Secondary | ICD-10-CM

## 2021-06-28 DIAGNOSIS — H3581 Retinal edema: Secondary | ICD-10-CM | POA: Diagnosis not present

## 2021-06-28 DIAGNOSIS — R2689 Other abnormalities of gait and mobility: Secondary | ICD-10-CM

## 2021-06-28 MED ORDER — PREDNISOLONE ACETATE 1 % OP SUSP: 1.0000 [drp] | Freq: Four times a day (QID) | OPHTHALMIC | 0 refills | Status: AC

## 2021-06-28 NOTE — Therapy (Signed)
[facility] [address] [place], [place], [address] Phone: [phone]   Fax:  [phone]  Wound Care Therapy  Patient Details  Name: Kurt [name] MRN: [id number] Date of Birth: 1951-11-26 No data recorded  Encounter Date: 06/28/2021   PT End of Session - 06/28/21 1755     Visit Number 3    Number of Visits 8    Date for PT Re-Evaluation 07/20/21    Authorization Type [employer] medicare (no visit limit, no auth)    PT Start Time 1708    PT Stop Time 1743    PT Time Calculation (min) 35 min    Activity Tolerance Patient tolerated treatment well    Behavior During Therapy [facility] for tasks assessed/performed             Past Medical History:  Diagnosis Date   Arthritis    Bronchitis    Bronchitis    Cataract    OU   Concussion    late 1990's  after a fall   GERD (gastroesophageal reflux disease)    History of kidney stones    Hypertension    Hypertensive retinopathy    OU   Osteogenesis imperfecta    PONV (postoperative nausea and vomiting)    pt has post polio syndrome   Post-polio syndrome    Type 2 diabetes mellitus (HCC) 12/25/2018    Past Surgical History:  Procedure Laterality Date   ANKLE FRACTURE SURGERY Bilateral    arm surgery Right    nerve surgery   COLONOSCOPY     ELBOW FRACTURE SURGERY Left    EYE MUSCLE SURGERY Left    LEG SURGERY Left    femur fracture with rod   REVERSE SHOULDER ARTHROPLASTY Right 11/10/2019   Procedure: RIGHT REVERSE SHOULDER ARTHROPLASTY;  Surgeon: Cammy Copa, MD;  Location: [facility] OR;  Service: Orthopedics;  Laterality: Right;   TONSILLECTOMY      There were no vitals filed for this visit.    Subjective Assessment - 06/28/21 1756     Subjective Pt stated he has had a busy day been to [place], eye doctor in [place] then here for wound care.  Arrived wearing compression garment on Rt LE, stated it feels too tight.                       Wound Therapy  - 06/28/21 0001     Subjective Pt stated he has had a busy day been to [place], eye doctor in [place] then here for wound care.  Arrived wearing compression garment on Rt LE, stated it feels too tight.    Patient and Family Stated Goals legs to heal    Date of Onset 06/08/21    Prior Treatments compression wraps    Pain Scale 0-10    Pain Score 0-No pain   Rt LE discomfort with tight compression garment, no pain scale given   Evaluation and Treatment Procedures Explained to Patient/Family Yes    Evaluation and Treatment Procedures agreed to    Wound Properties Date First Assessed: 06/22/21 Time First Assessed: 0915 Wound Type: Venous stasis ulcer Location: Pretibial Location Orientation: Left Wound Description (Comments): scattered LE scabbing Present on Admission: Yes   Dressing Type Compression wrap    Dressing Changed Changed    Dressing Status Dry;Old drainage    Dressing Change Frequency PRN    Site / Wound Assessment Red    Peri-wound Assessment Edema;Erythema (  blanchable)    Treatment Cleansed;Debridement (Selective)    Wound Therapy - Clinical Statement Pt educated on proper pressure with arterial deficiency and strongly encouraged to place order for 15-2mmHg.  Upon removal of dressings Lt LE noted maceration anterior shin and dry skin whole leg.  Cleansed LE, removal of dry slough over 2 open spots anterior Lt LE to promote healing.  Applied vaseline and lotion prior application of gauze and coban wiht reports of comfort at EOS.    Wound Therapy - Functional Problem List bathing, walking    Factors Delaying/Impairing Wound Healing Diabetes Mellitus;Altered sensation;Multiple medical problems;Vascular compromise    Wound Therapy - Frequency 2X / week    Wound Plan continue with dressing changes and debride if necessary, transition to self management when able- discuss reduced compression for compression garments.    Dressing  lotion, vaseline, kerlix, coban with minimal stretch,  #5 netting on left, compression garment on right                       PT Short Term Goals - 06/22/21 1024       PT SHORT TERM GOAL #1   Title Patient will be able to don/doff proper compression if indicated to reduce LE edema    Time 2    Period Weeks    Status New    Target Date 07/06/21      PT SHORT TERM GOAL #2   Title Pt to verbalize the importance of cleansing and moisturizing LE on a daily basis.    Time 2    Period Weeks    Status New    Target Date 07/06/21               PT Long Term Goals - 06/22/21 1025       PT LONG TERM GOAL #1   Title All wounds to be healed to allow pt to be able to don socks and pants with increased comfort.    Time 4    Period Weeks    Status New    Target Date 07/20/21      PT LONG TERM GOAL #2   Title Patient will be able to independently manage bilateral LE symptoms.    Time 4    Period Weeks    Status New    Target Date 07/20/21                    Patient will benefit from skilled therapeutic intervention in order to improve the following deficits and impairments:     Visit Diagnosis: Venous stasis dermatitis of both lower extremities  Localized edema  Other abnormalities of gait and mobility     Problem List Patient Active Problem List   Diagnosis Date Noted   Bilateral cellulitis of lower leg 06/10/2021   HLD (hyperlipidemia) 06/09/2021   Cellulitis and abscess of leg 12/15/2020   Cellulitis of left lower extremity 12/14/2020   Hypertension    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   Cellulitis 02/24/2019   Post-polio syndrome 12/25/2018   Osteogenesis imperfecta 12/25/2018   Type 2 diabetes mellitus (HCC) 12/25/2018   Becky Sax, LPTA/CLT; CBIS [phone]  Juel Burrow 06/28/2021, 6:06 PM  [facility] [address] [place], [place], [address] Phone: [phone]   Fax:  [phone]  Name: Kurt [name] MRN:  [id number] Date of Birth: [birth year]

## 2021-06-30 ENCOUNTER — Ambulatory Visit (HOSPITAL_COMMUNITY): Payer: Medicare Other | Admitting: Physical Therapy

## 2021-07-03 ENCOUNTER — Other Ambulatory Visit: Payer: Self-pay

## 2021-07-03 ENCOUNTER — Ambulatory Visit (HOSPITAL_COMMUNITY): Payer: Medicare Other | Admitting: Physical Therapy

## 2021-07-03 DIAGNOSIS — I872 Venous insufficiency (chronic) (peripheral): Secondary | ICD-10-CM

## 2021-07-03 DIAGNOSIS — S81801A Unspecified open wound, right lower leg, initial encounter: Secondary | ICD-10-CM

## 2021-07-03 DIAGNOSIS — S81802A Unspecified open wound, left lower leg, initial encounter: Secondary | ICD-10-CM

## 2021-07-03 DIAGNOSIS — R600 Localized edema: Secondary | ICD-10-CM

## 2021-07-03 DIAGNOSIS — R2689 Other abnormalities of gait and mobility: Secondary | ICD-10-CM

## 2021-07-03 NOTE — Therapy (Signed)
[facility] [address] [place], [place], [address] Phone: [phone]   Fax:  [phone]  Wound Care Therapy  Patient Details  Name: Kurt [name] MRN: [id number] Date of Birth: 1951-06-14 No data recorded  Encounter Date: 07/03/2021   PT End of Session - 07/03/21 1022     Visit Number 4    Number of Visits 8    Date for PT Re-Evaluation 07/20/21    Authorization Type UHC medicare (no visit limit, no auth)    PT Start Time [phone]    PT Stop Time 0912    PT Time Calculation (min) 37 min    Activity Tolerance Patient tolerated treatment well    Behavior During Therapy [facility] for tasks assessed/performed             Past Medical History:  Diagnosis Date   Arthritis    Bronchitis    Bronchitis    Cataract    OU   Concussion    late 1990's  after a fall   GERD (gastroesophageal reflux disease)    History of kidney stones    Hypertension    Hypertensive retinopathy    OU   Osteogenesis imperfecta    PONV (postoperative nausea and vomiting)    pt has post polio syndrome   Post-polio syndrome    Type 2 diabetes mellitus (HCC) 12/25/2018    Past Surgical History:  Procedure Laterality Date   ANKLE FRACTURE SURGERY Bilateral    arm surgery Right    nerve surgery   COLONOSCOPY     ELBOW FRACTURE SURGERY Left    EYE MUSCLE SURGERY Left    LEG SURGERY Left    femur fracture with rod   REVERSE SHOULDER ARTHROPLASTY Right 11/10/2019   Procedure: RIGHT REVERSE SHOULDER ARTHROPLASTY;  Surgeon: Cammy Copa, MD;  Location: [facility] OR;  Service: Orthopedics;  Laterality: Right;   TONSILLECTOMY      There were no vitals filed for this visit.               Wound Therapy - 07/03/21 0924     Subjective Pt states he's been wearing his compression everyday on his Rt LE but noticed blistering beginning on Saturday evening and has kept them off since.  States he's has ordered compression as this last therapist  told him what he was wearing was too much.    Patient and Family Stated Goals legs to heal    Date of Onset 06/08/21    Prior Treatments compression wraps    Pain Scale 0-10    Pain Score 0-No pain    Evaluation and Treatment Procedures Explained to Patient/Family Yes    Evaluation and Treatment Procedures agreed to    Wound Properties Date First Assessed: 06/22/21 Time First Assessed: 0915 Wound Type: Venous stasis ulcer Location: Pretibial Location Orientation: Right Wound Description (Comments): scattered LE scabbing Present on Admission: Yes   Wound Image Images linked: 1    Dressing Type Compression wrap;Impregnated gauze (bismuth)    Dressing Changed New    Treatment Cleansed    Wound Properties Date First Assessed: 06/22/21 Time First Assessed: 0915 Wound Type: Venous stasis ulcer Location: Pretibial Location Orientation: Left Wound Description (Comments): scattered LE scabbing Present on Admission: Yes   Wound Image Images linked: 1    Dressing Type Compression wrap    Dressing Changed Changed    Dressing Status Intact    Treatment Cleansed    Wound Therapy -  Clinical Statement Pt comes today with new blisters and openings on Rt LE .  cleansed, moisturized and applied xeroform to these areas.  Resumed bandaging on this LE using kerlix and coban as well.  Continued with LT LE dressing change with no breakdown or drainage noted on Lt LE.  Moisturized this LE well prior to redressing using kerlix and coban.    Wound Therapy - Functional Problem List bathing, walking    Factors Delaying/Impairing Wound Healing Diabetes Mellitus;Altered sensation;Multiple medical problems;Vascular compromise    Wound Therapy - Frequency 2X / week    Wound Plan continue with dressing changes and debride if necessary, transition to self management when able.    Dressing  lotion, vaseline, kerlix, coban with minimal stretch, #5 netting on both                       PT Short Term Goals -  06/22/21 1024       PT SHORT TERM GOAL #1   Title Patient will be able to don/doff proper compression if indicated to reduce LE edema    Time 2    Period Weeks    Status New    Target Date 07/06/21      PT SHORT TERM GOAL #2   Title Pt to verbalize the importance of cleansing and moisturizing LE on a daily basis.    Time 2    Period Weeks    Status New    Target Date 07/06/21               PT Long Term Goals - 06/22/21 1025       PT LONG TERM GOAL #1   Title All wounds to be healed to allow pt to be able to don socks and pants with increased comfort.    Time 4    Period Weeks    Status New    Target Date 07/20/21      PT LONG TERM GOAL #2   Title Patient will be able to independently manage bilateral LE symptoms.    Time 4    Period Weeks    Status New    Target Date 07/20/21                    Patient will benefit from skilled therapeutic intervention in order to improve the following deficits and impairments:     Visit Diagnosis: Venous stasis dermatitis of both lower extremities  Localized edema  Other abnormalities of gait and mobility  Multiple open wounds of right lower leg  Multiple open wounds of left lower leg     Problem List Patient Active Problem List   Diagnosis Date Noted   Bilateral cellulitis of lower leg 06/10/2021   HLD (hyperlipidemia) 06/09/2021   Cellulitis and abscess of leg 12/15/2020   Cellulitis of left lower extremity 12/14/2020   Hypertension    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 [place], [place], [address] Phone: [phone]   Fax:  [phone]  Name: Kurt [name] MRN: [id number] Date of Birth: 1951-10-07

## 2021-07-06 ENCOUNTER — Ambulatory Visit (HOSPITAL_COMMUNITY): Payer: Medicare Other | Admitting: Physical Therapy

## 2021-07-06 ENCOUNTER — Other Ambulatory Visit: Payer: Self-pay

## 2021-07-06 DIAGNOSIS — I872 Venous insufficiency (chronic) (peripheral): Secondary | ICD-10-CM

## 2021-07-06 DIAGNOSIS — R600 Localized edema: Secondary | ICD-10-CM

## 2021-07-06 DIAGNOSIS — R2689 Other abnormalities of gait and mobility: Secondary | ICD-10-CM

## 2021-07-06 NOTE — Therapy (Signed)
[facility] [address] [place], [place], [address] Phone: [phone]   Fax:  [phone]  Wound Care Therapy  Patient Details  Name: Kurt [name] MRN: [id number] Date of Birth: 1951-09-09 No data recorded  Encounter Date: 07/06/2021   PT End of Session - 07/06/21 1510     Visit Number 5    Number of Visits 8    Date for PT Re-Evaluation 07/20/21    Authorization Type UHC medicare (no visit limit, no auth)    PT Start Time 1407    PT Stop Time 1450    PT Time Calculation (min) 43 min    Activity Tolerance Patient tolerated treatment well    Behavior During Therapy [facility] for tasks assessed/performed             Past Medical History:  Diagnosis Date   Arthritis    Bronchitis    Bronchitis    Cataract    OU   Concussion    late 1990's  after a fall   GERD (gastroesophageal reflux disease)    History of kidney stones    Hypertension    Hypertensive retinopathy    OU   Osteogenesis imperfecta    PONV (postoperative nausea and vomiting)    pt has post polio syndrome   Post-polio syndrome    Type 2 diabetes mellitus (HCC) 12/25/2018    Past Surgical History:  Procedure Laterality Date   ANKLE FRACTURE SURGERY Bilateral    arm surgery Right    nerve surgery   COLONOSCOPY     ELBOW FRACTURE SURGERY Left    EYE MUSCLE SURGERY Left    LEG SURGERY Left    femur fracture with rod   REVERSE SHOULDER ARTHROPLASTY Right 11/10/2019   Procedure: RIGHT REVERSE SHOULDER ARTHROPLASTY;  Surgeon: Cammy Copa, MD;  Location: [facility] OR;  Service: Orthopedics;  Laterality: Right;   TONSILLECTOMY      There were no vitals filed for this visit.               Wound Therapy - 07/06/21 0001     Subjective States that he has been having a lot of pain in his right leg. More so then usual. States that he doesn't feel the dressings are tight but he is having more pain    Patient and Family Stated Goals legs to heal     Date of Onset 06/08/21    Prior Treatments compression wraps    Pain Scale 0-10    Pain Score 7     Pain Type Acute pain    Pain Location Leg    Pain Orientation Right;Lower    Evaluation and Treatment Procedures Explained to Patient/Family Yes    Evaluation and Treatment Procedures agreed to    Wound Properties Date First Assessed: 06/22/21 Time First Assessed: 0915 Wound Type: Venous stasis ulcer Location: Pretibial Location Orientation: Right Wound Description (Comments): scattered LE scabbing Present on Admission: Yes   Dressing Type Compression wrap;Impregnated gauze (bismuth)    Dressing Changed Changed    % Wound base Red or Granulating 80%    % Wound base Yellow/Fibrinous Exudate 20%    Peri-wound Assessment Erythema (blanchable)    Wound Length (cm) --   4 wounds - 3 that are open and one that is not - painful around   Drainage Amount Scant    Drainage Description Serosanguineous    Treatment Cleansed    Wound Properties Date First Assessed: 06/22/21  Time First Assessed: 0915 Wound Type: Venous stasis ulcer Location: Pretibial Location Orientation: Left Wound Description (Comments): scattered LE scabbing Present on Admission: Yes   Dressing Type Compression wrap    Dressing Changed Changed    Dressing Status Dry;Old drainage    Dressing Change Frequency PRN    Site / Wound Assessment Red    Peri-wound Assessment Erythema (blanchable)    Treatment Cleansed;Debridement (Selective)    Wound Therapy - Clinical Statement Right leg with increased redness and wounds. 4 wounds noted with wounds measuring between 2.4-2.8 cm at widest point. No heat noted but concerned about recent increase in pain in his right lower leg and reduced arterial flow which may result in muted response if infection present. Will send wound culture order to MD to see if MD agrees about performing culture to rule out possible infection. Instructed patient to go to ED if symptoms worsen. Continued with vaseoline,  xeroform and kerlix with light coban. Will continue to monitor right leg wounds and pain.    Wound Therapy - Functional Problem List bathing, walking    Factors Delaying/Impairing Wound Healing Diabetes Mellitus;Altered sensation;Multiple medical problems;Vascular compromise    Wound Therapy - Frequency 2X / week    Wound Plan continue with dressing changes and debride if necessary, transition to self management when able.    Dressing  lotion, vaseline, kerlix, coban with minimal stretch, #5 netting on both                       PT Short Term Goals - 06/22/21 1024       PT SHORT TERM GOAL #1   Title Patient will be able to don/doff proper compression if indicated to reduce LE edema    Time 2    Period Weeks    Status New    Target Date 07/06/21      PT SHORT TERM GOAL #2   Title Pt to verbalize the importance of cleansing and moisturizing LE on a daily basis.    Time 2    Period Weeks    Status New    Target Date 07/06/21               PT Long Term Goals - 06/22/21 1025       PT LONG TERM GOAL #1   Title All wounds to be healed to allow pt to be able to don socks and pants with increased comfort.    Time 4    Period Weeks    Status New    Target Date 07/20/21      PT LONG TERM GOAL #2   Title Patient will be able to independently manage bilateral LE symptoms.    Time 4    Period Weeks    Status New    Target Date 07/20/21                   Plan - 07/06/21 1511     Clinical Impression Statement see above    Personal Factors and Comorbidities Age;Fitness;Comorbidity 3+;Time since onset of injury/illness/exacerbation    Comorbidities Venous and arterial insufficiency, diabetes, post polio syndrome, osteogenesis imperfecta, several bilateral LE ortho injuries/surgeries    Examination-Activity Limitations Locomotion Level;Transfers;Stand;Stairs;Hygiene/Grooming    Examination-Participation Restrictions Meal Prep;Cleaning;Volunteer;Yard  Work;Shop    Stability/Clinical Decision Making Stable/Uncomplicated    Rehab Potential Good    PT Frequency 2x / week    PT Duration 4 weeks    PT Treatment/Interventions  ADLs/Self Care Home Management;Electrical Stimulation;Ultrasound;DME Instruction;Gait training;Stair training;Therapeutic exercise;Neuromuscular re-education;Patient/family education;Splinting;Taping;Passive range of motion;Compression bandaging    PT Next Visit Plan dressing changes and debridment if nessessary to promote tissue healing and reduce risk of infection    PT Home Exercise Plan ankle pumps, toe crunches    Consulted and Agree with Plan of Care Patient             Patient will benefit from skilled therapeutic intervention in order to improve the following deficits and impairments:  Abnormal gait, Difficulty walking, Decreased skin integrity, Decreased mobility, Increased edema  Visit Diagnosis: Venous stasis dermatitis of both lower extremities  Localized edema  Other abnormalities of gait and mobility     Problem List Patient Active Problem List   Diagnosis Date Noted   Bilateral cellulitis of lower leg 06/10/2021   HLD (hyperlipidemia) 06/09/2021   Cellulitis and abscess of leg 12/15/2020   Cellulitis of left lower extremity 12/14/2020   Hypertension    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 [place], [place], [address] Phone: [phone]   Fax:  [phone]  Name: Kurt [name] MRN: [id number] Date of Birth: 03-Aug-1951

## 2021-07-11 ENCOUNTER — Other Ambulatory Visit (HOSPITAL_COMMUNITY)
Admission: RE | Admit: 2021-07-11 | Discharge: 2021-07-11 | Disposition: A | Discharge status: Home / Self Care | Payer: Medicare Other | Source: Other Acute Inpatient Hospital | Attending: Family Medicine | Admitting: Family Medicine

## 2021-07-11 ENCOUNTER — Encounter (HOSPITAL_COMMUNITY): Payer: Self-pay

## 2021-07-11 ENCOUNTER — Ambulatory Visit (HOSPITAL_COMMUNITY): Payer: Medicare Other

## 2021-07-11 ENCOUNTER — Other Ambulatory Visit: Payer: Self-pay

## 2021-07-11 DIAGNOSIS — I872 Venous insufficiency (chronic) (peripheral): Secondary | ICD-10-CM | POA: Insufficient documentation

## 2021-07-11 DIAGNOSIS — R2689 Other abnormalities of gait and mobility: Secondary | ICD-10-CM

## 2021-07-11 DIAGNOSIS — R600 Localized edema: Secondary | ICD-10-CM

## 2021-07-11 NOTE — Progress Notes (Signed)
[facility] - Clinic Note  07/12/2021     CHIEF COMPLAINT Patient presents for Retina Follow Up   HISTORY OF PRESENT ILLNESS: Kurt [name] is a 70 y.o. male who presents to the clinic today for:  HPI     Retina Follow Up   Patient presents with  CRVO/BRVO.  In right eye.  This started 18 months ago.  Severity is moderate.  Duration of 2 weeks.  I, the attending physician,  performed the HPI with the patient and updated documentation appropriately.        Comments   70 y/o male pt here for 2 wk f/u for BRVO w/CME OD.  S/p focal laser OD 7.13.22.  No change in [place] OU.  Denies pain, FOL, floaters.  BS 145 this a.m.  Last HgA1C 7.5.      Last edited by Rennis ChrisZamora, Duff Pozzi, MD on 07/12/2021  1:56 PM.      Pt states   Referring physician:  [facility] [address] [place] [id number]  HISTORICAL INFORMATION:   Selected notes from the MEDICAL RECORD NUMBER Diabetic Eval per Dr. Karleen HampshireSpencer   CURRENT MEDICATIONS: No current outpatient medications on file. (Ophthalmic Drugs)   No current facility-administered medications for this visit. (Ophthalmic Drugs)   Current Outpatient Medications (Other)  Medication Sig   acetaminophen (TYLENOL) 650 MG CR tablet Take 1,300 mg by mouth every 8 (eight) hours as needed for pain.   aspirin 81 MG chewable tablet Chew 1 tablet (81 mg total) by mouth daily.   cephALEXin (KEFLEX) 500 MG capsule 1 capsule   ELIQUIS 2.5 MG TABS tablet Take 2.5 mg by mouth 2 (two) times daily.   glipiZIDE (GLUCOTROL XL) 10 MG 24 hr tablet Take 10 mg by mouth at bedtime.    HYDROcodone-acetaminophen (NORCO) 7.5-325 MG tablet 1 tablet as needed   LANTUS SOLOSTAR 100 UNIT/ML Solostar Pen Inject 34 Units into the skin at bedtime.   lisinopril (PRINIVIL,ZESTRIL) 20 MG tablet Take 20 mg by mouth daily.   methocarbamol (ROBAXIN) 500 MG tablet Take 500 mg by mouth at bedtime as needed for muscle spasms.   ONETOUCH ULTRA  test strip 1 each 2 (two) times daily.   oxyCODONE (OXY IR/ROXICODONE) 5 MG immediate release tablet Take 1 tablet (5 mg total) by mouth every 8 (eight) hours as needed for moderate pain (pain score 4-6).   simvastatin (ZOCOR) 20 MG tablet Take 20 mg by mouth every evening.   SitaGLIPtin-MetFORMIN HCl [phone] MG TB24 Take 1 tablet by mouth at bedtime.   No current facility-administered medications for this visit. (Other)   REVIEW OF SYSTEMS: ROS   Positive for: Gastrointestinal, Genitourinary, Musculoskeletal, Endocrine, Eyes Negative for: Constitutional, Neurological, Skin, HENT, Cardiovascular, Respiratory, Psychiatric, Allergic/Imm, Heme/Lymph Last edited by Celine MansBaxley, Andrew G, COA on 07/12/2021  1:28 PM.        ALLERGIES Allergies  Allergen Reactions   Augmentin [Amoxicillin-Pot Clavulanate] Nausea And Vomiting   Iodine Hives   Tape Rash    Paper    PAST MEDICAL HISTORY Past Medical History:  Diagnosis Date   Arthritis    Bronchitis    Bronchitis    Cataract    OU   Concussion    late 1990's  after a fall   GERD (gastroesophageal reflux disease)    History of kidney stones    Hypertension    Hypertensive retinopathy    OU   Osteogenesis imperfecta  PONV (postoperative nausea and vomiting)    pt has post polio syndrome   Post-polio syndrome    Type 2 diabetes mellitus (HCC) 12/25/2018   Past Surgical History:  Procedure Laterality Date   ANKLE FRACTURE SURGERY Bilateral    arm surgery Right    nerve surgery   COLONOSCOPY     ELBOW FRACTURE SURGERY Left    EYE MUSCLE SURGERY Left    LEG SURGERY Left    femur fracture with rod   REVERSE SHOULDER ARTHROPLASTY Right 11/10/2019   Procedure: RIGHT REVERSE SHOULDER ARTHROPLASTY;  Surgeon: Cammy Copa, MD;  Location: [facility] OR;  Service: Orthopedics;  Laterality: Right;   TONSILLECTOMY     FAMILY HISTORY Family History  Problem Relation Age of Onset   Hypertension Mother    Diabetes Brother    SOCIAL  HISTORY Social History   Tobacco Use   Smoking status: Never   Smokeless tobacco: Never  Vaping Use   Vaping Use: Never used  Substance Use Topics   Alcohol use: Yes    Comment: 1 beer occasionally   Drug use: No         OPHTHALMIC EXAM:  Base Eye Exam     Visual Acuity (Snellen - Linear)       Right Left   Dist cc 20/30 20/20 -2   Dist ph cc 20/20 -2     Correction: Glasses         Tonometry (Tonopen, 1:31 PM)       Right Left   Pressure 13 14         Pupils       Dark Light Shape React APD   Right 3 2 Round Brisk None   Left 3 2 Round Brisk None         Visual Fields (Counting fingers)       Left Right    Full Full         Extraocular Movement       Right Left    Full, Ortho Full, Ortho         Neuro/Psych     Oriented x3: Yes   Mood/Affect: Normal         Dilation     Both eyes: 1.0% Mydriacyl, 2.5% Phenylephrine @ 1:31 PM           Slit Lamp and Fundus Exam     Slit Lamp Exam       Right Left   Lids/Lashes Dermatochalasis - upper lid, mild Meibomian gland dysfunction Dermatochalasis - upper lid, Dermatochalasis - lower lid, mild Meibomian gland dysfunction   Conjunctiva/Sclera blue sclera blue sclera   Cornea arcus, 1+ PEE, mild tear film debris arcus, SCH   Anterior Chamber deep and clear deep and clear   Iris round and dilated, no NVI round and dilated, no NVI   Lens 2-3+ NS w/brunescence, 2-3+CS 2-3+ NS w/brunesecence, 2-3+CS   Vitreous mild syneresis, PVD mild syneresis         Fundus Exam       Right Left   Disc pink and sharp pink and sharp   C/D Ratio 0.5 0.4   Macula Good foveal reflex; persistent, focal edema SN macula -- improving, focal exudate superior macula -resolved, early focal laser changes flat, good foveal reflex, mild RPE mottling and clumping, +drusen, No heme or edema   Vessels attenuated, mild tortuousity, mild AV crossing changes mild attenuation and tortuosity, mild AV crossing  changes, mild copper wiring  Periphery attached, no heme attached, no heme           IMAGING AND PROCEDURES  Imaging and Procedures for  OCT, Retina - OU - Both Eyes       Right Eye Quality was good. Central Foveal Thickness: 238. Progression has improved. Findings include no SRF, intraretinal fluid, retinal drusen , normal foveal contour (Mild interval improvement in IRF/edema superior macula).   Left Eye Quality was good. Central Foveal Thickness: 258. Progression has been stable. Findings include normal foveal contour, no IRF, no SRF, vitreomacular adhesion , retinal drusen (Focal PED/drusen).   Notes *Images captured and stored on drive  Diagnosis / Impression:  OD: BRVO with persistent IRF superior macula -- mild interval improvement OS: NFP, no SRF/IRF, drusen, VMA  Clinical management:  See below  Abbreviations: NFP - Normal foveal profile. CME - cystoid macular edema. PED - pigment epithelial detachment. IRF - intraretinal fluid. SRF - subretinal fluid. EZ - ellipsoid zone. ERM - epiretinal membrane. ORA - outer retinal atrophy. ORT - outer retinal tubulation. SRHM - subretinal hyper-reflective material       Intravitreal Injection, Pharmacologic Agent - OD - Right Eye       Time Out 07/12/2021. 2:04 PM. Confirmed correct patient, procedure, site, and patient consented.   Anesthesia Topical anesthesia was used. Anesthetic medications included Lidocaine 2%, Proparacaine 0.5%.   Procedure Preparation included 5% betadine to ocular surface, eyelid speculum. A (32g) needle was used.   Injection: 2 mg aflibercept 2 MG/0.05ML   Route: Intravitreal, Site: Right Eye   NDC: L6038910, Lot: [id number], Expiration date: 04/15/2022, Waste: 0.05 mL   Post-op Post injection exam found visual acuity of at least counting fingers. The patient tolerated the procedure well. There were no complications. The patient received written and verbal post procedure care  education.             ASSESSMENT/PLAN:    ICD-10-CM   1. Branch retinal vein occlusion of right eye with macular edema  H34.8310 Intravitreal Injection, Pharmacologic Agent - OD - Right Eye    aflibercept (EYLEA) SOLN 2 mg    2. Retinal edema  H35.81 OCT, Retina - OU - Both Eyes    3. Diabetes mellitus type 2 without retinopathy (HCC)  E11.9     4. Essential hypertension  I10     5. Hypertensive retinopathy of both eyes  H35.033     6. Combined forms of age-related cataract of both eyes  H25.813       1,2. BRVO with CME OD  - s/p IVA OD #1 (01.19.21), #2 (02.16.21), #3 (03.16.21), #4 (4.13.21) -- IVA resistance             - s/p IVE OD #1 (05.11.21--sample), #2 (06.08.21), #3 (07.06.21), #4 (8.3.21), #5 (08.31.21), #6 (09.28.21), #7 (10.26.21), #8 (11.23.21), #9 (12.22.21), #10 (1.24.22), #11 (2.22.22), #12 (3.22.22), #13 (04.19.22), #14 (05.25.22), #15 (06.29.22)  - s/p focal laser OD (07.13.22)  - BCVA 20/20 OD  - exam with focal edema, IRH, CWS superonasal macula -- improving  - OCT shows persistent IRF superior macula -- mild interval improvement  - recommend IVE OD #16 today (7.27.22)  - RBA of procedure discussed, questions answered  - informed consent obtained  - Avastin informed consent form signed and scanned on 01.19.21  - Eylea informed consent form signed and scanned on 05.11.21  - see procedure note  - Eylea4U benefits investigation started, 05.11.21 -- approved through [employer] through 12.31.22  - F/U  4 wks -- DFE/OCT/possible injection  3. Diabetes mellitus, type 2 without retinopathy OU  - The incidence, risk factors for progression, natural history and treatment options for diabetic retinopathy  were discussed with patient.    - The need for close monitoring of blood glucose, blood pressure, and serum lipids, avoiding cigarette or any type of tobacco, and the need for long term follow up was also discussed with patient.  - f/u in 1 year, sooner  prn  4,5. Hypertensive retinopathy OU  - discussed importance of tight BP control  - monitor  6. Age related cataracts OU   - The symptoms of cataract, surgical options, and treatments and risks were discussed with patient.  - discussed diagnosis and progression  - not yet visually significant  - monitor for now  Ophthalmic Meds Ordered this visit:  Meds ordered this encounter  Medications   aflibercept (EYLEA) SOLN 2 mg      Return in about 4 weeks (around 08/09/2021) for 4 weeks BRVO OD.  There are no Patient Instructions on file for this visit.  This document serves as a record of services personally performed by Karie Chimera, MD, PhD. It was created on their behalf by Annalee Genta, COMT. The creation of this record is the provider's dictation and/or activities during the visit.  Electronically signed by: Annalee Genta, COMT 07/13/21 11:55 PM  Karie Chimera, M.D., Ph.D. Diseases & Surgery of the Retina and Vitreous [facility]  I have reviewed the above documentation for accuracy and completeness, and I agree with the above. Karie Chimera, M.D., Ph.D. 07/13/21 11:55 PM   Abbreviations: M myopia (nearsighted); A astigmatism; H hyperopia (farsighted); P presbyopia; Mrx spectacle prescription;  CTL contact lenses; OD right eye; OS left eye; OU both eyes  XT exotropia; ET esotropia; PEK punctate epithelial keratitis; PEE punctate epithelial erosions; DES dry eye syndrome; MGD meibomian gland dysfunction; ATs artificial tears; PFAT's preservative free artificial tears; NSC nuclear sclerotic cataract; PSC posterior subcapsular cataract; ERM epi-retinal membrane; PVD posterior vitreous detachment; RD retinal detachment; DM diabetes mellitus; DR diabetic retinopathy; NPDR non-proliferative diabetic retinopathy; PDR proliferative diabetic retinopathy; CSME clinically significant macular edema; DME diabetic macular edema; dbh dot blot hemorrhages; CWS cotton  wool spot; POAG primary open angle glaucoma; C/D cup-to-disc ratio; HVF humphrey visual field; GVF goldmann visual field; OCT optical coherence tomography; IOP intraocular pressure; BRVO Branch retinal vein occlusion; CRVO central retinal vein occlusion; CRAO central retinal artery occlusion; BRAO branch retinal artery occlusion; RT retinal tear; SB scleral buckle; PPV pars plana vitrectomy; VH Vitreous hemorrhage; PRP panretinal laser photocoagulation; IVK intravitreal kenalog; VMT vitreomacular traction; MH Macular hole;  NVD neovascularization of the disc; NVE neovascularization elsewhere; AREDS age related eye disease study; ARMD age related macular degeneration; POAG primary open angle glaucoma; EBMD epithelial/anterior basement membrane dystrophy; ACIOL anterior chamber intraocular lens; IOL intraocular lens; PCIOL posterior chamber intraocular lens; Phaco/IOL phacoemulsification with intraocular lens placement; PRK photorefractive keratectomy; LASIK laser assisted in situ keratomileusis; HTN hypertension; DM diabetes mellitus; COPD chronic obstructive pulmonary disease

## 2021-07-11 NOTE — Therapy (Signed)
[facility] [address] [place], [place], [address] Phone: [phone]   Fax:  [phone]  Wound Care Therapy  Patient Details  Name: Kurt [name] MRN: [id number] Date of Birth: Jan 11, 1951 No data recorded  Encounter Date: 07/11/2021   PT End of Session - 07/11/21 1904     Visit Number 6    Number of Visits 8    Date for PT Re-Evaluation 07/20/21    Authorization Type [employer] medicare (no visit limit, no auth)    PT Start Time 1620    PT Stop Time 1708    PT Time Calculation (min) 48 min    Activity Tolerance Patient tolerated treatment well    Behavior During Therapy [facility] for tasks assessed/performed             Past Medical History:  Diagnosis Date   Arthritis    Bronchitis    Bronchitis    Cataract    OU   Concussion    late 1990's  after a fall   GERD (gastroesophageal reflux disease)    History of kidney stones    Hypertension    Hypertensive retinopathy    OU   Osteogenesis imperfecta    PONV (postoperative nausea and vomiting)    pt has post polio syndrome   Post-polio syndrome    Type 2 diabetes mellitus (HCC) 12/25/2018    Past Surgical History:  Procedure Laterality Date   ANKLE FRACTURE SURGERY Bilateral    arm surgery Right    nerve surgery   COLONOSCOPY     ELBOW FRACTURE SURGERY Left    EYE MUSCLE SURGERY Left    LEG SURGERY Left    femur fracture with rod   REVERSE SHOULDER ARTHROPLASTY Right 11/10/2019   Procedure: RIGHT REVERSE SHOULDER ARTHROPLASTY;  Surgeon: Cammy Copa, MD;  Location: [facility] OR;  Service: Orthopedics;  Laterality: Right;   TONSILLECTOMY      There were no vitals filed for this visit.    Subjective Assessment - 07/11/21 1855     Subjective Pt stated Rt leg is feeling a little better today than last time.  Pt arrived with 15-20 mmHg compression socks.                       Wound Therapy - 07/11/21 0001     Subjective Pt stated Rt leg is feeling a  little better today than last time.  Pt arrived with 15-20 mmHg compression socks.    Patient and Family Stated Goals legs to heal    Date of Onset 06/08/21    Prior Treatments compression wraps    Pain Scale 0-10    Pain Score 4     Pain Type Acute pain    Pain Location Leg    Pain Orientation Right;Lower    Evaluation and Treatment Procedures Explained to Patient/Family Yes    Evaluation and Treatment Procedures agreed to    Wound Properties Date First Assessed: 06/22/21 Time First Assessed: 0915 Wound Type: Venous stasis ulcer Location: Pretibial Location Orientation: Right Wound Description (Comments): scattered LE scabbing Present on Admission: Yes   Wound Image Images linked: 1    Dressing Type Compression wrap    Dressing Changed Changed    Dressing Status Dry;Old drainage    Dressing Change Frequency PRN    Site / Wound Assessment Red    % Wound base Red or Granulating 90%    % Wound base Yellow/Fibrinous  Exudate 10%    Peri-wound Assessment Erythema (blanchable)    Drainage Amount Scant    Drainage Description Serosanguineous    Treatment Cleansed    Wound Properties Date First Assessed: 06/22/21 Time First Assessed: 0915 Wound Type: Venous stasis ulcer Location: Pretibial Location Orientation: Left Wound Description (Comments): scattered LE scabbing Present on Admission: Yes   Dressing Type --   compression sock 15-20 mmHg   Dressing Changed Other (Comment)   cleansed leg, educated donning compression sock   Dressing Status Dry    Dressing Change Frequency PRN    Site / Wound Assessment Red    Peri-wound Assessment Erythema (blanchable)    Treatment Cleansed    Wound Therapy - Clinical Statement Rt LE with decreased redness on removal of dressings.  Received singed order and culture complete this session for Rt medial wounds.  Lt LE with no wounds.  Pt brought in 15-20 mmHg, educated technqiue to donn dressings.  Pt with some difficulty donning due to decreased mobility and  ROM wiht Lt LE though able to donn I wiht no assistance, just took time to complete.  Pt educated to assure no twisting and no wrinkles in compression socks.  Educated proper wear time and to remove at night, verbalized understanding.    Wound Therapy - Functional Problem List bathing, walking    Factors Delaying/Impairing Wound Healing Diabetes Mellitus;Altered sensation;Multiple medical problems;Vascular compromise    Wound Therapy - Frequency 2X / week    Wound Plan F/U wiht culture results (taken 07/11/21).  Continue with dressing changes and debride if necessary, transition to self management when able.    Dressing  Rt: vaseline, lotion, xeroform with gauze, kerlix and loose coban, #5 netting    Dressing Lt: pt able to donn compression sock I                     PT Education - 07/11/21 1907     Education Details Educated technique for donning 15-20 mmHg compression garments.  Educated appropraite wearing time and to doff at night.    Person(s) Educated Patient    Methods Explanation    Comprehension Verbalized understanding              PT Short Term Goals - 06/22/21 1024       PT SHORT TERM GOAL #1   Title Patient will be able to don/doff proper compression if indicated to reduce LE edema    Time 2    Period Weeks    Status New    Target Date 07/06/21      PT SHORT TERM GOAL #2   Title Pt to verbalize the importance of cleansing and moisturizing LE on a daily basis.    Time 2    Period Weeks    Status New    Target Date 07/06/21               PT Long Term Goals - 06/22/21 1025       PT LONG TERM GOAL #1   Title All wounds to be healed to allow pt to be able to don socks and pants with increased comfort.    Time 4    Period Weeks    Status New    Target Date 07/20/21      PT LONG TERM GOAL #2   Title Patient will be able to independently manage bilateral LE symptoms.    Time 4    Period Weeks  Status New    Target Date 07/20/21                     Patient will benefit from skilled therapeutic intervention in order to improve the following deficits and impairments:     Visit Diagnosis: Venous stasis dermatitis of both lower extremities  Localized edema  Other abnormalities of gait and mobility     Problem List Patient Active Problem List   Diagnosis Date Noted   Bilateral cellulitis of lower leg 06/10/2021   HLD (hyperlipidemia) 06/09/2021   Cellulitis and abscess of leg 12/15/2020   Cellulitis of left lower extremity 12/14/2020   Hypertension    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   Cellulitis 02/24/2019   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 [place], [place], [address] Phone: [phone]   Fax:  [phone]  Name: Kurt [name] MRN: [id number] Date of Birth: [birth year]

## 2021-07-12 ENCOUNTER — Encounter (INDEPENDENT_AMBULATORY_CARE_PROVIDER_SITE_OTHER): Payer: Self-pay | Admitting: Ophthalmology

## 2021-07-12 ENCOUNTER — Ambulatory Visit (INDEPENDENT_AMBULATORY_CARE_PROVIDER_SITE_OTHER): Payer: Medicare Other | Admitting: Ophthalmology

## 2021-07-12 DIAGNOSIS — H35033 Hypertensive retinopathy, bilateral: Secondary | ICD-10-CM

## 2021-07-12 DIAGNOSIS — E119 Type 2 diabetes mellitus without complications: Secondary | ICD-10-CM

## 2021-07-12 DIAGNOSIS — I10 Essential (primary) hypertension: Secondary | ICD-10-CM

## 2021-07-12 DIAGNOSIS — H25813 Combined forms of age-related cataract, bilateral: Secondary | ICD-10-CM

## 2021-07-12 DIAGNOSIS — H3581 Retinal edema: Secondary | ICD-10-CM

## 2021-07-12 DIAGNOSIS — H34831 Tributary (branch) retinal vein occlusion, right eye, with macular edema: Secondary | ICD-10-CM | POA: Diagnosis not present

## 2021-07-13 ENCOUNTER — Other Ambulatory Visit: Payer: Self-pay

## 2021-07-13 ENCOUNTER — Ambulatory Visit (HOSPITAL_COMMUNITY): Payer: Medicare Other | Admitting: Physical Therapy

## 2021-07-13 DIAGNOSIS — I872 Venous insufficiency (chronic) (peripheral): Secondary | ICD-10-CM | POA: Diagnosis not present

## 2021-07-13 DIAGNOSIS — R600 Localized edema: Secondary | ICD-10-CM

## 2021-07-13 LAB — AEROBIC CULTURE W GRAM STAIN (SUPERFICIAL SPECIMEN)
Culture: NORMAL
Gram Stain: NONE SEEN

## 2021-07-13 MED ORDER — AFLIBERCEPT 2MG/0.05ML IZ SOLN FOR KALEIDOSCOPE
2.0000 mg | INTRAVITREAL | Status: AC | PRN
  Administered 2021-07-12: 2 mg via INTRAVITREAL

## 2021-07-13 NOTE — Therapy (Signed)
[place], [place], [address] Phone: [phone]   Fax:  [phone]  Wound Care Therapy PHYSICAL THERAPY DISCHARGE SUMMARY  Visits from Start of Care: 7  Current functional level related to goals / functional outcomes: 3 of 4 wounds healed. Fourth wound is 100% granulated and .2x.2 cm    Remaining deficits: PT able to get compression socks on but it is difficult   Education / Equipment: The importance of donning compression everyday and moisturizing    Patient agrees to discharge. Patient goals were partially met. Patient is being discharged due to meeting the stated rehab goals.  Patient Details  Name: Kurt [name] MRN: [id number] Date of Birth: 21-May-1951 No data recorded  Encounter Date: 07/13/2021   PT End of Session - 07/13/21 1306     Visit Number 7    Number of Visits 7    Date for PT Re-Evaluation 07/20/21    Authorization Type [employer] medicare (no visit limit, no auth)    PT Start Time 1220    PT Stop Time 1245    PT Time Calculation (min) 25 min    Activity Tolerance Patient tolerated treatment well    Behavior During Therapy WFL for tasks assessed/performed             Past Medical History:  Diagnosis Date   Arthritis    Bronchitis    Bronchitis    Cataract    OU   Concussion    late 1990's  after a fall   GERD (gastroesophageal reflux disease)    History of kidney stones    Hypertension    Hypertensive retinopathy    OU   Osteogenesis imperfecta    PONV (postoperative nausea and vomiting)    pt has post polio syndrome   Post-polio syndrome    Type 2 diabetes mellitus ([place]) 12/25/2018    Past Surgical History:  Procedure Laterality Date   ANKLE FRACTURE SURGERY Bilateral    arm surgery Right    nerve surgery   COLONOSCOPY     ELBOW FRACTURE SURGERY Left    EYE MUSCLE SURGERY Left    LEG SURGERY Left    femur fracture with rod   REVERSE SHOULDER ARTHROPLASTY Right  11/10/2019   Procedure: RIGHT REVERSE SHOULDER ARTHROPLASTY;  Surgeon: Meredith Pel, MD;  Location: [facility];  Service: Orthopedics;  Laterality: Right;   TONSILLECTOMY      There were no vitals filed for this visit.               Wound Therapy - 07/13/21 0001     Subjective Pt states that his leg flares up every six months    Patient and Family Stated Goals legs to heal    Date of Onset 06/08/21    Prior Treatments compression wraps    Pain Scale 0-10    Pain Score 4     Pain Type Acute pain    Pain Orientation Right;Left    Evaluation and Treatment Procedures Explained to Patient/Family Yes    Evaluation and Treatment Procedures agreed to    Wound Properties Date First Assessed: 06/22/21 Time First Assessed: 0915 Wound Type: Venous stasis ulcer Location: Pretibial Location Orientation: Right Wound Description (Comments): scattered LE scabbing Present on Admission: Yes   Site / Wound Assessment Red    % Wound base Red or Granulating 100%    Peri-wound Assessment Erythema (blanchable)    Wound Length (cm) --  all wounds except the most posterior wound have healed ; medial posterior wound is .2x.2 cm 100% granulated   Drainage Amount None    Treatment Cleansed    Wound Therapy - Clinical Statement Culture has not growth at this time for preliminary report. Three of the four wounds are now healed.  The fourth wound no longer needs debridement and is only .2x.2 cm therefore pt is no longer in need of skilled care.  Attempted to educate pt on the use of a butler to don compression socks pt stated he has tried and is unable to do so.  Attempted to explain how to hold onto the heel and turn garment inside out, pt state that he is unable todo this. Pt has donned compression sock on his LT LE already therefore therapist told pt to use whatever method that he was able to don his left compression sock on.    Wound Therapy - Functional Problem List bathing, walking    Factors  Delaying/Impairing Wound Healing Diabetes Mellitus;Altered sensation;Multiple medical problems;Vascular compromise    Wound Plan Discharge.    Dressing  Bandaid to wound on Rt LE follwoed by donning of compression sock.                       PT Short Term Goals - 07/13/21 1307       PT SHORT TERM GOAL #1   Title Patient will be able to don/doff proper compression if indicated to reduce LE edema    Time 2    Period Weeks    Status Achieved    Target Date 07/06/21      PT SHORT TERM GOAL #2   Title Pt to verbalize the importance of cleansing and moisturizing LE on a daily basis.    Time 2    Period Weeks    Status Achieved    Target Date 07/06/21               PT Long Term Goals - 07/13/21 1307       PT LONG TERM GOAL #1   Title All wounds to be healed to allow pt to be able to don socks and pants with increased comfort.    Time 4    Period Weeks    Status Partially Met      PT LONG TERM GOAL #2   Title Patient will be able to independently manage bilateral LE symptoms.    Status Achieved                   Plan - 07/13/21 1306     Clinical Impression Statement see above    Personal Factors and Comorbidities Age;Fitness;Comorbidity 3+;Time since onset of injury/illness/exacerbation    Comorbidities Venous and arterial insufficiency, diabetes, post polio syndrome, osteogenesis imperfecta, several bilateral LE ortho injuries/surgeries    Examination-Activity Limitations Locomotion Level;Transfers;Stand;Stairs;Hygiene/Grooming    Examination-Participation Restrictions Meal Prep;Cleaning;Volunteer;Yard Work;Shop    Stability/Clinical Decision Making Stable/Uncomplicated    Rehab Potential Good    PT Frequency 2x / week    PT Duration 4 weeks    PT Treatment/Interventions ADLs/Self Care Home Management;Electrical Stimulation;Ultrasound;DME Instruction;Gait training;Stair training;Therapeutic exercise;Neuromuscular re-education;Patient/family  education;Splinting;Taping;Passive range of motion;Compression bandaging    PT Next Visit Plan Discharge no skilled care needed.    PT Home Exercise Plan ankle pumps, toe crunches    Consulted and Agree with Plan of Care Patient  Patient will benefit from skilled therapeutic intervention in order to improve the following deficits and impairments:  Abnormal gait, Difficulty walking, Decreased skin integrity, Decreased mobility, Increased edema  Visit Diagnosis: Venous stasis dermatitis of both lower extremities  Localized edema     Problem List Patient Active Problem List   Diagnosis Date Noted   Bilateral cellulitis of lower leg 06/10/2021   HLD (hyperlipidemia) 06/09/2021   Cellulitis and abscess of leg 12/15/2020   Cellulitis of left lower extremity 12/14/2020   Hypertension    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   Cellulitis 02/24/2019   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

## 2021-07-18 ENCOUNTER — Ambulatory Visit (HOSPITAL_COMMUNITY): Payer: Medicare Other

## 2021-07-20 ENCOUNTER — Ambulatory Visit (HOSPITAL_COMMUNITY): Payer: Medicare Other | Admitting: Physical Therapy

## 2021-08-08 NOTE — Progress Notes (Signed)
[facility] - Clinic Note  08/09/2021     CHIEF COMPLAINT Patient presents for Retina Follow Up   HISTORY OF PRESENT ILLNESS: Kurt [name] is a 70 y.o. male who presents to the clinic today for:  HPI     Retina Follow Up   Patient presents with  CRVO/BRVO.  In right eye.  This started 4 weeks ago.  I, the attending physician,  performed the HPI with the patient and updated documentation appropriately.        Comments   Patient here for 4 weeks retina follow up for BRVO OD. Patient states vision doing good. Eyes itch. Has allergies.      Last edited by Rennis Chris, MD on 08/15/2021 11:53 AM.    Pt states vision is the same  Referring physician:  [facility] [address] [place],  [place] [address]  HISTORICAL INFORMATION:   Selected notes from the MEDICAL RECORD NUMBER Diabetic Eval per Dr. Karleen Hampshire   CURRENT MEDICATIONS: No current outpatient medications on file. (Ophthalmic Drugs)   No current facility-administered medications for this visit. (Ophthalmic Drugs)   Current Outpatient Medications (Other)  Medication Sig   acetaminophen (TYLENOL) 650 MG CR tablet Take 1,300 mg by mouth every 8 (eight) hours as needed for pain.   aspirin 81 MG chewable tablet Chew 1 tablet (81 mg total) by mouth daily.   cephALEXin (KEFLEX) 500 MG capsule 1 capsule   ELIQUIS 2.5 MG TABS tablet Take 2.5 mg by mouth 2 (two) times daily.   glipiZIDE (GLUCOTROL XL) 10 MG 24 hr tablet Take 10 mg by mouth at bedtime.    HYDROcodone-acetaminophen (NORCO) 7.5-325 MG tablet 1 tablet as needed   LANTUS SOLOSTAR 100 UNIT/ML Solostar Pen Inject 34 Units into the skin at bedtime.   lisinopril (PRINIVIL,ZESTRIL) 20 MG tablet Take 20 mg by mouth daily.   methocarbamol (ROBAXIN) 500 MG tablet Take 500 mg by mouth at bedtime as needed for muscle spasms.   ONETOUCH ULTRA test strip 1 each 2 (two) times daily.   oxyCODONE (OXY IR/ROXICODONE) 5 MG immediate  release tablet Take 1 tablet (5 mg total) by mouth every 8 (eight) hours as needed for moderate pain (pain score 4-6).   simvastatin (ZOCOR) 20 MG tablet Take 20 mg by mouth every evening.   SitaGLIPtin-MetFORMIN HCl [id number] MG TB24 Take 1 tablet by mouth at bedtime.   No current facility-administered medications for this visit. (Other)   REVIEW OF SYSTEMS: ROS   Positive for: Gastrointestinal, Genitourinary, Musculoskeletal, Endocrine, Eyes Negative for: Constitutional, Neurological, Skin, HENT, Cardiovascular, Respiratory, Psychiatric, Allergic/Imm, Heme/Lymph Last edited by Laddie Aquas, COA on 08/09/2021  2:03 PM.         ALLERGIES Allergies  Allergen Reactions   Augmentin [Amoxicillin-Pot Clavulanate] Nausea And Vomiting   Iodine Hives   Tape Rash    Paper    PAST MEDICAL HISTORY Past Medical History:  Diagnosis Date   Arthritis    Bronchitis    Bronchitis    Cataract    OU   Concussion    late 1990's  after a fall   GERD (gastroesophageal reflux disease)    History of kidney stones    Hypertension    Hypertensive retinopathy    OU   Osteogenesis imperfecta    PONV (postoperative nausea and vomiting)    pt has post polio syndrome   Post-polio syndrome    Type 2 diabetes mellitus ([facility]) 12/25/2018  Past Surgical History:  Procedure Laterality Date   ANKLE FRACTURE SURGERY Bilateral    arm surgery Right    nerve surgery   COLONOSCOPY     ELBOW FRACTURE SURGERY Left    EYE MUSCLE SURGERY Left    LEG SURGERY Left    femur fracture with rod   REVERSE SHOULDER ARTHROPLASTY Right 11/10/2019   Procedure: RIGHT REVERSE SHOULDER ARTHROPLASTY;  Surgeon: Cammy Copa, MD;  Location: [facility];  Service: Orthopedics;  Laterality: Right;   TONSILLECTOMY     FAMILY HISTORY Family History  Problem Relation Age of Onset   Hypertension Mother    Diabetes Brother    SOCIAL HISTORY Social History   Tobacco Use   Smoking status: Never   Smokeless tobacco:  Never  Vaping Use   Vaping Use: Never used  Substance Use Topics   Alcohol use: Yes    Comment: 1 beer occasionally   Drug use: No         OPHTHALMIC EXAM:  Base Eye Exam     Visual Acuity (Snellen - Linear)       Right Left   Dist cc 20/25 -2 20/20   Dist ph cc 20/20 -2     Correction: Glasses         Tonometry (Tonopen, 2:01 PM)       Right Left   Pressure 12 14         Pupils       Dark Light Shape React APD   Right 3 2 Round Brisk None   Left 3 2 Round Brisk None         Visual Fields (Counting fingers)       Left Right    Full Full         Extraocular Movement       Right Left    Full, Ortho Full, Ortho         Neuro/Psych     Oriented x3: Yes   Mood/Affect: Normal         Dilation     Both eyes: 1.0% Mydriacyl, 2.5% Phenylephrine @ 2:01 PM           Slit Lamp and Fundus Exam     Slit Lamp Exam       Right Left   Lids/Lashes Dermatochalasis - upper lid, mild Meibomian gland dysfunction Dermatochalasis - upper lid, Dermatochalasis - lower lid, mild Meibomian gland dysfunction   Conjunctiva/Sclera blue sclera blue sclera   Cornea arcus, 1+ PEE, mild tear film debris arcus, SCH   Anterior Chamber deep and clear deep and clear   Iris round and dilated, no NVI round and dilated, no NVI   Lens 2-3+ NS w/brunescence, 2-3+CS 2-3+ NS w/brunesecence, 2-3+CS   Vitreous mild syneresis, PVD mild syneresis         Fundus Exam       Right Left   Disc pink and sharp pink and sharp   C/D Ratio 0.5 0.4   Macula Good foveal reflex; persistent, focal edema SN macula -- improving, focal exudate superior macula - resolved, mild focal laser changes flat, good foveal reflex, mild RPE mottling and clumping, +drusen, No heme or edema   Vessels attenuated, Tortuous mild attenuation and tortuosity, mild AV crossing changes, mild copper wiring   Periphery attached, no heme attached, no heme           Refraction     Wearing Rx  Sphere Cylinder Axis Add   Right -2.00 +2.25 111 +2.75   Left -1.50 +1.50 094 +2.75           IMAGING AND PROCEDURES  Imaging and Procedures for  OCT, Retina - OU - Both Eyes       Right Eye Quality was good. Central Foveal Thickness: 235. Progression has improved. Findings include no SRF, intraretinal fluid, retinal drusen , normal foveal contour (Mild interval improvement in IRF/edema superior macula).   Left Eye Quality was good. Central Foveal Thickness: 259. Progression has been stable. Findings include normal foveal contour, no IRF, no SRF, vitreomacular adhesion , retinal drusen (Focal PED/drusen).   Notes *Images captured and stored on drive  Diagnosis / Impression:  OD: BRVO with persistent IRF superior macula -- mild interval improvement OS: NFP, no SRF/IRF, drusen, VMA  Clinical management:  See below  Abbreviations: NFP - Normal foveal profile. CME - cystoid macular edema. PED - pigment epithelial detachment. IRF - intraretinal fluid. SRF - subretinal fluid. EZ - ellipsoid zone. ERM - epiretinal membrane. ORA - outer retinal atrophy. ORT - outer retinal tubulation. SRHM - subretinal hyper-reflective material       Intravitreal Injection, Pharmacologic Agent - OD - Right Eye       Time Out 08/09/2021. 3:09 PM. Confirmed correct patient, procedure, site, and patient consented.   Anesthesia Topical anesthesia was used. Anesthetic medications included Lidocaine 2%, Proparacaine 0.5%.   Procedure Preparation included 5% betadine to ocular surface, eyelid speculum. A (32g) needle was used.   Injection: 2 mg aflibercept 2 MG/0.05ML   Route: Intravitreal, Site: Right Eye   NDC: [id number], Lot: [id number], Expiration date: 05/15/2022, Waste: 0.05 mL   Post-op Post injection exam found visual acuity of at least counting fingers. The patient tolerated the procedure well. There were no complications. The patient received written and verbal post procedure care  education.            ASSESSMENT/PLAN:    ICD-10-CM   1. Branch retinal vein occlusion of right eye with macular edema  H34.8310 Intravitreal Injection, Pharmacologic Agent - OD - Right Eye    aflibercept (EYLEA) SOLN 2 mg    2. 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 Essential hypertension  I10     4. Hypertensive retinopathy of both eyes  H35.033     5. Retinal edema  H35.81 OCT, Retina - OU - Both Eyes    6. Combined forms of age-related cataract of both eyes  H25.813      1,2. BRVO with CME OD  - s/p IVA OD #1 (01.19.21), #2 (02.16.21), #3 (03.16.21), #4 (4.13.21) -- IVA resistance             - s/p IVE OD #1 (05.11.21--sample), #2 (06.08.21), #3 (07.06.21), #4 (8.3.21), #5 (08.31.21), #6 (09.28.21), #7 (10.26.21), #8 (11.23.21), #9 (12.22.21), #10 (1.24.22), #11 (2.22.22), #12 (3.22.22), #13 (04.19.22), #14 (05.25.22), #15 (06.29.22), #16 (07.27.22)  - s/p focal laser OD (07.13.22)  - BCVA 20/20 OD  - exam with focal edema, IRH, CWS superonasal macula -- improving  - OCT shows persistent IRF superior macula -- mild interval improvement  - recommend IVE OD #17 today (08.24.22)  - RBA of procedure discussed, questions answered  - informed consent obtained  - Avastin informed consent form signed and scanned on 01.19.21  - Eylea informed consent form signed and scanned on 05.11.21  - see procedure note  - Eylea4U benefits investigation started,  05.11.21 -- approved through [employer] through 12.31.22  - F/U 4 wks -- DFE/OCT/possible injection  3. Diabetes mellitus, type 2 without retinopathy OU  - The incidence, risk factors for progression, natural history and treatment options for diabetic retinopathy  were discussed with patient.    - The need for close monitoring of blood glucose, blood pressure, and serum lipids, avoiding cigarette or any type of tobacco, and the need for long term follow up was also discussed with patient.  - f/u in 1 year,  sooner prn  4,5. Hypertensive retinopathy OU  - discussed importance of tight BP control  - monitor  6. Age related cataracts OU   - The symptoms of cataract, surgical options, and treatments and risks were discussed with patient.  - discussed diagnosis and progression  - not yet visually significant  - monitor for now  Ophthalmic Meds Ordered this visit:  Meds ordered this encounter  Medications   aflibercept (EYLEA) SOLN 2 mg       Return in about 4 weeks (around 09/06/2021) for BRVO OD, Dilated Exam, OCT, Possible Injxn.  There are no Patient Instructions on file for this visit.  This document serves as a record of services personally performed by Karie Chimera, MD, PhD. It was created on their behalf by De Blanch, an ophthalmic technician. The creation of this record is the provider's dictation and/or activities during the visit.    Electronically signed by: De Blanch, OA, 08/15/21  12:06 PM   This document serves as a record of services personally performed by Karie Chimera, MD, PhD. It was created on their behalf by Glee Arvin. Manson Passey, OA an ophthalmic technician. The creation of this record is the provider's dictation and/or activities during the visit.    Electronically signed by: Glee Arvin. Manson Passey, [place] 08.24.2022 12:06 PM  Karie Chimera, M.D., Ph.D. Diseases & Surgery of the Retina and Vitreous [facility] 08/09/2021   I have reviewed the above documentation for accuracy and completeness, and I agree with the above. Karie Chimera, M.D., Ph.D. 08/15/21 12:06 PM   Abbreviations: M myopia (nearsighted); A astigmatism; H hyperopia (farsighted); P presbyopia; Mrx spectacle prescription;  CTL contact lenses; OD right eye; OS left eye; OU both eyes  XT exotropia; ET esotropia; PEK punctate epithelial keratitis; PEE punctate epithelial erosions; DES dry eye syndrome; MGD meibomian gland dysfunction; ATs artificial tears; PFAT's  preservative free artificial tears; NSC nuclear sclerotic cataract; PSC posterior subcapsular cataract; ERM epi-retinal membrane; PVD posterior vitreous detachment; RD retinal detachment; DM diabetes mellitus; DR diabetic retinopathy; NPDR non-proliferative diabetic retinopathy; PDR proliferative diabetic retinopathy; CSME clinically significant macular edema; DME diabetic macular edema; dbh dot blot hemorrhages; CWS cotton wool spot; POAG primary open angle glaucoma; C/D cup-to-disc ratio; HVF humphrey visual field; GVF goldmann visual field; OCT optical coherence tomography; IOP intraocular pressure; BRVO Branch retinal vein occlusion; CRVO central retinal vein occlusion; CRAO central retinal artery occlusion; BRAO branch retinal artery occlusion; RT retinal tear; SB scleral buckle; PPV pars plana vitrectomy; VH Vitreous hemorrhage; PRP panretinal laser photocoagulation; IVK intravitreal kenalog; VMT vitreomacular traction; MH Macular hole;  NVD neovascularization of the disc; NVE neovascularization elsewhere; AREDS age related eye disease study; ARMD age related macular degeneration; POAG primary open angle glaucoma; EBMD epithelial/anterior basement membrane dystrophy; ACIOL anterior chamber intraocular lens; IOL intraocular lens; PCIOL posterior chamber intraocular lens; Phaco/IOL phacoemulsification with intraocular lens placement; PRK photorefractive keratectomy; LASIK laser assisted in situ keratomileusis; HTN hypertension; DM  diabetes mellitus; COPD chronic obstructive pulmonary disease

## 2021-08-09 ENCOUNTER — Ambulatory Visit (INDEPENDENT_AMBULATORY_CARE_PROVIDER_SITE_OTHER): Payer: Medicare Other | Admitting: Ophthalmology

## 2021-08-09 ENCOUNTER — Other Ambulatory Visit: Payer: Self-pay

## 2021-08-09 ENCOUNTER — Encounter (INDEPENDENT_AMBULATORY_CARE_PROVIDER_SITE_OTHER): Payer: Self-pay | Admitting: Ophthalmology

## 2021-08-09 DIAGNOSIS — E119 Type 2 diabetes mellitus without complications: Secondary | ICD-10-CM | POA: Diagnosis not present

## 2021-08-09 DIAGNOSIS — H35033 Hypertensive retinopathy, bilateral: Secondary | ICD-10-CM

## 2021-08-09 DIAGNOSIS — I10 Essential (primary) hypertension: Secondary | ICD-10-CM | POA: Diagnosis not present

## 2021-08-09 DIAGNOSIS — H34831 Tributary (branch) retinal vein occlusion, right eye, with macular edema: Secondary | ICD-10-CM

## 2021-08-09 DIAGNOSIS — H3581 Retinal edema: Secondary | ICD-10-CM

## 2021-08-09 DIAGNOSIS — H25813 Combined forms of age-related cataract, bilateral: Secondary | ICD-10-CM

## 2021-08-15 ENCOUNTER — Encounter (INDEPENDENT_AMBULATORY_CARE_PROVIDER_SITE_OTHER): Payer: Self-pay | Admitting: Ophthalmology

## 2021-08-15 MED ORDER — AFLIBERCEPT 2MG/0.05ML IZ SOLN FOR KALEIDOSCOPE
2.0000 mg | INTRAVITREAL | Status: AC | PRN
  Administered 2021-08-09: 2 mg via INTRAVITREAL

## 2021-09-05 NOTE — Progress Notes (Signed)
[facility] - Clinic Note  09/06/2021     CHIEF COMPLAINT Patient presents for Retina Follow Up   HISTORY OF PRESENT ILLNESS: Kurt [name] is a 70 y.o. male who presents to the clinic today for:  HPI     Retina Follow Up   Patient presents with  CRVO/BRVO.  In right eye.  This started 4 weeks ago.  I, the attending physician,  performed the HPI with the patient and updated documentation appropriately.        Comments   Patient here for 4 weeks retina follow up for BRVO OD. Patient states vision doing good. No eye pain. Eyes water- allergies.       Last edited by Rennis Chris, MD on 09/10/2021  1:13 AM.     Referring physician:  [facility] [address] [place],  [place] [address]  HISTORICAL INFORMATION:   Selected notes from the MEDICAL RECORD NUMBER Diabetic Eval per Dr. Karleen Hampshire   CURRENT MEDICATIONS: No current outpatient medications on file. (Ophthalmic Drugs)   No current facility-administered medications for this visit. (Ophthalmic Drugs)   Current Outpatient Medications (Other)  Medication Sig   acetaminophen (TYLENOL) 650 MG CR tablet Take 1,300 mg by mouth every 8 (eight) hours as needed for pain.   aspirin 81 MG chewable tablet Chew 1 tablet (81 mg total) by mouth daily.   cephALEXin (KEFLEX) 500 MG capsule 1 capsule   ELIQUIS 2.5 MG TABS tablet Take 2.5 mg by mouth 2 (two) times daily.   glipiZIDE (GLUCOTROL XL) 10 MG 24 hr tablet Take 10 mg by mouth at bedtime.    HYDROcodone-acetaminophen (NORCO) 7.5-325 MG tablet 1 tablet as needed   LANTUS SOLOSTAR 100 UNIT/ML Solostar Pen Inject 34 Units into the skin at bedtime.   lisinopril (PRINIVIL,ZESTRIL) 20 MG tablet Take 20 mg by mouth daily.   methocarbamol (ROBAXIN) 500 MG tablet Take 500 mg by mouth at bedtime as needed for muscle spasms.   ONETOUCH ULTRA test strip 1 each 2 (two) times daily.   oxyCODONE (OXY IR/ROXICODONE) 5 MG immediate release tablet Take  1 tablet (5 mg total) by mouth every 8 (eight) hours as needed for moderate pain (pain score 4-6).   simvastatin (ZOCOR) 20 MG tablet Take 20 mg by mouth every evening.   SitaGLIPtin-MetFORMIN HCl [id number] MG TB24 Take 1 tablet by mouth at bedtime.   No current facility-administered medications for this visit. (Other)   REVIEW OF SYSTEMS: ROS   Positive for: Gastrointestinal, Genitourinary, Musculoskeletal, Endocrine, Eyes Negative for: Constitutional, Neurological, Skin, HENT, Cardiovascular, Respiratory, Psychiatric, Allergic/Imm, Heme/Lymph Last edited by Laddie Aquas, COA on 09/06/2021  1:54 PM.     ALLERGIES Allergies  Allergen Reactions   Augmentin [Amoxicillin-Pot Clavulanate] Nausea And Vomiting   Iodine Hives   Tape Rash    Paper    PAST MEDICAL HISTORY Past Medical History:  Diagnosis Date   Arthritis    Bronchitis    Bronchitis    Cataract    OU   Concussion    late 1990's  after a fall   GERD (gastroesophageal reflux disease)    History of kidney stones    Hypertension    Hypertensive retinopathy    OU   Osteogenesis imperfecta    PONV (postoperative nausea and vomiting)    pt has post polio syndrome   Post-polio syndrome    Type 2 diabetes mellitus (HCC) 12/25/2018   Past Surgical History:  Procedure  Laterality Date   ANKLE FRACTURE SURGERY Bilateral    arm surgery Right    nerve surgery   COLONOSCOPY     ELBOW FRACTURE SURGERY Left    EYE MUSCLE SURGERY Left    LEG SURGERY Left    femur fracture with rod   REVERSE SHOULDER ARTHROPLASTY Right 11/10/2019   Procedure: RIGHT REVERSE SHOULDER ARTHROPLASTY;  Surgeon: Cammy Copa, MD;  Location: [facility] OR;  Service: Orthopedics;  Laterality: Right;   TONSILLECTOMY     FAMILY HISTORY Family History  Problem Relation Age of Onset   Hypertension Mother    Diabetes Brother    SOCIAL HISTORY Social History   Tobacco Use   Smoking status: Never   Smokeless tobacco: Never  Vaping Use   Vaping  Use: Never used  Substance Use Topics   Alcohol use: Yes    Comment: 1 beer occasionally   Drug use: No         OPHTHALMIC EXAM:  Base Eye Exam     Visual Acuity (Snellen - Linear)       Right Left   Dist cc 20/30 20/25 +1   Dist ph cc 20/25 -2 20/20 -2    Correction: Glasses         Tonometry (Tonopen, 1:52 PM)       Right Left   Pressure 12 12         Pupils       Dark Light Shape React APD   Right 3 2 Round Brisk None   Left 3 2 Round Brisk None         Visual Fields (Counting fingers)       Left Right    Full Full         Extraocular Movement       Right Left    Full, Ortho Full, Ortho         Neuro/Psych     Oriented x3: Yes   Mood/Affect: Normal         Dilation     Both eyes: 1.0% Mydriacyl, 2.5% Phenylephrine @ 1:51 PM           Slit Lamp and Fundus Exam     Slit Lamp Exam       Right Left   Lids/Lashes Dermatochalasis - upper lid, mild Meibomian gland dysfunction Dermatochalasis - upper lid, Dermatochalasis - lower lid, mild Meibomian gland dysfunction   Conjunctiva/Sclera blue sclera blue sclera   Cornea arcus, 1+ PEE, mild tear film debris arcus, SCH   Anterior Chamber deep and clear deep and clear   Iris round and dilated, no NVI round and dilated, no NVI   Lens 2-3+ NS w/brunescence, 2-3+CS 2-3+ NS w/brunesecence, 2-3+CS   Vitreous mild syneresis, PVD mild syneresis         Fundus Exam       Right Left   Disc pink and sharp pink and sharp   C/D Ratio 0.5 0.4   Macula Good foveal reflex; persistent, focal edema SN macula -- slightly improved, focal exudate superior macula - resolved, mild focal laser changes flat, good foveal reflex, mild RPE mottling and clumping, +drusen, No heme or edema   Vessels attenuated, Tortuous mild attenuation and tortuosity, mild AV crossing changes, mild copper wiring   Periphery attached, no heme attached, no heme           Refraction     Wearing Rx       Sphere  Cylinder Axis  Add   Right -2.00 +2.25 111 +2.75   Left -1.50 +1.50 094 +2.75           IMAGING AND PROCEDURES  Imaging and Procedures for  OCT, Retina - OU - Both Eyes       Right Eye Quality was good. Central Foveal Thickness: 233. Progression has improved. Findings include no SRF, intraretinal fluid, retinal drusen , normal foveal contour (Mild interval improvement in IRF/cystic changes superior macula).   Left Eye Quality was good. Central Foveal Thickness: 257. Progression has been stable. Findings include normal foveal contour, no IRF, no SRF, vitreomacular adhesion , retinal drusen (Focal PED/drusen).   Notes *Images captured and stored on drive  Diagnosis / Impression:  OD: BRVO with persistent IRF superior macula -- mild interval improvement OS: NFP, no SRF/IRF, drusen, VMA  Clinical management:  See below  Abbreviations: NFP - Normal foveal profile. CME - cystoid macular edema. PED - pigment epithelial detachment. IRF - intraretinal fluid. SRF - subretinal fluid. EZ - ellipsoid zone. ERM - epiretinal membrane. ORA - outer retinal atrophy. ORT - outer retinal tubulation. SRHM - subretinal hyper-reflective material       Intravitreal Injection, Pharmacologic Agent - OD - Right Eye       Time Out 09/06/2021. 2:24 PM. Confirmed correct patient, procedure, site, and patient consented.   Anesthesia Topical anesthesia was used. Anesthetic medications included Lidocaine 2%, Proparacaine 0.5%.   Procedure Preparation included 5% betadine to ocular surface, eyelid speculum. A (32g) needle was used.   Injection: 2 mg aflibercept 2 MG/0.05ML   Route: Intravitreal, Site: Right Eye   NDC: L6038910, Lot: [id number], Expiration date: 06/15/2022, Waste: 0.05 mL   Post-op Post injection exam found visual acuity of at least counting fingers. The patient tolerated the procedure well. There were no complications. The patient received written and verbal post procedure care  education. Post injection medications were not given.            ASSESSMENT/PLAN:    ICD-10-CM   1. Branch retinal vein occlusion of right eye with macular edema  H34.8310 Intravitreal Injection, Pharmacologic Agent - OD - Right Eye    aflibercept (EYLEA) SOLN 2 mg    2. Retinal edema  H35.81 OCT, Retina - OU - Both Eyes    3. Diabetes mellitus type 2 without retinopathy (HCC)  E11.9     4. Essential hypertension  I10     5. Hypertensive retinopathy of both eyes  H35.033     6. Combined forms of age-related cataract of both eyes  H25.813       1,2. BRVO with CME OD  - s/p IVA OD #1 (01.19.21), #2 (02.16.21), #3 (03.16.21), #4 (4.13.21) -- IVA resistance             - s/p IVE OD #1 (05.11.21--sample), #2 (06.08.21), #3 (07.06.21), #4 (8.3.21), #5 (08.31.21), #6 (09.28.21), #7 (10.26.21), #8 (11.23.21), #9 (12.22.21), #10 (1.24.22), #11 (2.22.22), #12 (3.22.22), #13 (04.19.22), #14 (05.25.22), #15 (06.29.22), #16 (07.27.22), #17 (08.24.22)  - s/p focal laser OD (07.13.22)  - BCVA 20/25 OD  - exam with focal edema, IRH, CWS superonasal macula -- improving  - OCT shows persistent IRF superior macula -- mild interval improvement  - recommend IVE OD #18 today (09.21.22)  - RBA of procedure discussed, questions answered  - informed consent obtained  - Avastin informed consent form signed and scanned on 01.19.21  - Eylea informed consent form signed and scanned on 05.11.21  - see procedure  note  - Eylea4U benefits investigation started, 05.11.21 -- approved through [employer] through 12.31.22  - F/U 4 wks -- DFE/OCT/possible injection  3. Diabetes mellitus, type 2 without retinopathy OU  - The incidence, risk factors for progression, natural history and treatment options for diabetic retinopathy  were discussed with patient.    - The need for close monitoring of blood glucose, blood pressure, and serum lipids, avoiding cigarette or any type of tobacco, and the need for long term  follow up was also discussed with patient.  - f/u in 1 year, sooner prn  4,5. Hypertensive retinopathy OU  - discussed importance of tight BP control  - monitor  6. Age related cataracts OU   - The symptoms of cataract, surgical options, and treatments and risks were discussed with patient.  - discussed diagnosis and progression  - not yet visually significant  - monitor for now  Ophthalmic Meds Ordered this visit:  Meds ordered this encounter  Medications   aflibercept (EYLEA) SOLN 2 mg        Return in about 4 weeks (around 10/04/2021) for f/u BRVO OD, DFE, OCT.  There are no Patient Instructions on file for this visit.  This document serves as a record of services personally performed by Karie Chimera, MD, PhD. It was created on their behalf by De Blanch, an ophthalmic technician. The creation of this record is the provider's dictation and/or activities during the visit.    Electronically signed by: De Blanch, OA, 09/10/21  1:15 AM   This document serves as a record of services personally performed by Karie Chimera, MD, PhD. It was created on their behalf by Glee Arvin. Manson Passey, OA an ophthalmic technician. The creation of this record is the provider's dictation and/or activities during the visit.    Electronically signed by: Glee Arvin. Manson Passey, [place] 09.21.2022 1:15 AM   Karie Chimera, M.D., Ph.D. Diseases & Surgery of the Retina and Vitreous [facility]   I have reviewed the above documentation for accuracy and completeness, and I agree with the above. Karie Chimera, M.D., Ph.D. 09/10/21 1:16 AM    Abbreviations: M myopia (nearsighted); A astigmatism; H hyperopia (farsighted); P presbyopia; Mrx spectacle prescription;  CTL contact lenses; OD right eye; OS left eye; OU both eyes  XT exotropia; ET esotropia; PEK punctate epithelial keratitis; PEE punctate epithelial erosions; DES dry eye syndrome; MGD meibomian gland dysfunction; ATs  artificial tears; PFAT's preservative free artificial tears; NSC nuclear sclerotic cataract; PSC posterior subcapsular cataract; ERM epi-retinal membrane; PVD posterior vitreous detachment; RD retinal detachment; DM diabetes mellitus; DR diabetic retinopathy; NPDR non-proliferative diabetic retinopathy; PDR proliferative diabetic retinopathy; CSME clinically significant macular edema; DME diabetic macular edema; dbh dot blot hemorrhages; CWS cotton wool spot; POAG primary open angle glaucoma; C/D cup-to-disc ratio; HVF humphrey visual field; GVF goldmann visual field; OCT optical coherence tomography; IOP intraocular pressure; BRVO Branch retinal vein occlusion; CRVO central retinal vein occlusion; CRAO central retinal artery occlusion; BRAO branch retinal artery occlusion; RT retinal tear; SB scleral buckle; PPV pars plana vitrectomy; VH Vitreous hemorrhage; PRP panretinal laser photocoagulation; IVK intravitreal kenalog; VMT vitreomacular traction; MH Macular hole;  NVD neovascularization of the disc; NVE neovascularization elsewhere; AREDS age related eye disease study; ARMD age related macular degeneration; POAG primary open angle glaucoma; EBMD epithelial/anterior basement membrane dystrophy; ACIOL anterior chamber intraocular lens; IOL intraocular lens; PCIOL posterior chamber intraocular lens; Phaco/IOL phacoemulsification with intraocular lens placement; PRK photorefractive keratectomy; LASIK laser  assisted in situ keratomileusis; HTN hypertension; DM diabetes mellitus; COPD chronic obstructive pulmonary disease

## 2021-09-06 ENCOUNTER — Ambulatory Visit (INDEPENDENT_AMBULATORY_CARE_PROVIDER_SITE_OTHER): Payer: Medicare Other | Admitting: Ophthalmology

## 2021-09-06 ENCOUNTER — Encounter (INDEPENDENT_AMBULATORY_CARE_PROVIDER_SITE_OTHER): Payer: Self-pay | Admitting: Ophthalmology

## 2021-09-06 ENCOUNTER — Other Ambulatory Visit: Payer: Self-pay

## 2021-09-06 DIAGNOSIS — I10 Essential (primary) hypertension: Secondary | ICD-10-CM | POA: Diagnosis not present

## 2021-09-06 DIAGNOSIS — H25813 Combined forms of age-related cataract, bilateral: Secondary | ICD-10-CM

## 2021-09-06 DIAGNOSIS — E119 Type 2 diabetes mellitus without complications: Secondary | ICD-10-CM

## 2021-09-06 DIAGNOSIS — H3581 Retinal edema: Secondary | ICD-10-CM

## 2021-09-06 DIAGNOSIS — H34831 Tributary (branch) retinal vein occlusion, right eye, with macular edema: Secondary | ICD-10-CM | POA: Diagnosis not present

## 2021-09-06 DIAGNOSIS — H35033 Hypertensive retinopathy, bilateral: Secondary | ICD-10-CM

## 2021-09-10 ENCOUNTER — Encounter (INDEPENDENT_AMBULATORY_CARE_PROVIDER_SITE_OTHER): Payer: Self-pay | Admitting: Ophthalmology

## 2021-09-10 MED ORDER — AFLIBERCEPT 2MG/0.05ML IZ SOLN FOR KALEIDOSCOPE
2.0000 mg | INTRAVITREAL | Status: AC | PRN
  Administered 2021-09-06: 2 mg via INTRAVITREAL

## 2021-10-03 NOTE — Progress Notes (Signed)
[facility] - Clinic Note  10/04/2021     CHIEF COMPLAINT Patient presents for Retina Follow Up   HISTORY OF PRESENT ILLNESS: Kurt [name] is a 70 y.o. male who presents to the clinic today for:  HPI     Retina Follow Up   Patient presents with  CRVO/BRVO.  In right eye.  This started years ago.  Severity is moderate.  Duration of 4 weeks.  Since onset it is stable.  I, the attending physician,  performed the HPI with the patient and updated documentation appropriately.        Comments   70 y/o male pt here for 4 wk f/u for BRVO w/CME OD.  No change in [place] OU.  Denies pain, FOL, floaters.  No gtts.      Last edited by Rennis Chris, MD on 10/06/2021 12:37 PM.      Referring physician:  [facility] [address] [place],  [place] [address]  HISTORICAL INFORMATION:   Selected notes from the MEDICAL RECORD NUMBER Diabetic Eval per Dr. Karleen Hampshire   CURRENT MEDICATIONS: No current outpatient medications on file. (Ophthalmic Drugs)   No current facility-administered medications for this visit. (Ophthalmic Drugs)   Current Outpatient Medications (Other)  Medication Sig   acetaminophen (TYLENOL) 650 MG CR tablet Take 1,300 mg by mouth every 8 (eight) hours as needed for pain.   aspirin 81 MG chewable tablet Chew 1 tablet (81 mg total) by mouth daily.   B-D ULTRAFINE III SHORT PEN 31G X 8 MM MISC Inject into the skin.   cephALEXin (KEFLEX) 500 MG capsule 1 capsule   ELIQUIS 2.5 MG TABS tablet Take 2.5 mg by mouth 2 (two) times daily.   glipiZIDE (GLUCOTROL XL) 10 MG 24 hr tablet Take 10 mg by mouth at bedtime.    HYDROcodone-acetaminophen (NORCO) 7.5-325 MG tablet 1 tablet as needed   ketoconazole (NIZORAL) 2 % cream 1 application to feet and in between toes   LANTUS SOLOSTAR 100 UNIT/ML Solostar Pen Inject 34 Units into the skin at bedtime.   lisinopril (PRINIVIL,ZESTRIL) 20 MG tablet Take 20 mg by mouth daily.   methocarbamol  (ROBAXIN) 500 MG tablet Take 500 mg by mouth at bedtime as needed for muscle spasms.   ONETOUCH ULTRA test strip 1 each 2 (two) times daily.   oxyCODONE (OXY IR/ROXICODONE) 5 MG immediate release tablet Take 1 tablet (5 mg total) by mouth every 8 (eight) hours as needed for moderate pain (pain score 4-6).   simvastatin (ZOCOR) 20 MG tablet Take 20 mg by mouth every evening.   SitaGLIPtin-MetFORMIN HCl [id number] MG TB24 Take 1 tablet by mouth at bedtime.   No current facility-administered medications for this visit. (Other)   REVIEW OF SYSTEMS: ROS   Positive for: Musculoskeletal, Endocrine, Eyes Negative for: Constitutional, Gastrointestinal, Neurological, Skin, Genitourinary, HENT, Cardiovascular, Respiratory, Psychiatric, Allergic/Imm, Heme/Lymph Last edited by Celine Mans, COA on 10/04/2021  2:30 PM.      ALLERGIES Allergies  Allergen Reactions   Augmentin [Amoxicillin-Pot Clavulanate] Nausea And Vomiting   Iodine Hives   Tape Rash    Paper    PAST MEDICAL HISTORY Past Medical History:  Diagnosis Date   Arthritis    Bronchitis    Bronchitis    Cataract    OU   Concussion    late 1990's  after a fall   GERD (gastroesophageal reflux disease)    History of kidney stones  Hypertension    Hypertensive retinopathy    OU   Osteogenesis imperfecta    PONV (postoperative nausea and vomiting)    pt has post polio syndrome   Post-polio syndrome    Type 2 diabetes mellitus (HCC) 12/25/2018   Past Surgical History:  Procedure Laterality Date   ANKLE FRACTURE SURGERY Bilateral    arm surgery Right    nerve surgery   COLONOSCOPY     ELBOW FRACTURE SURGERY Left    EYE MUSCLE SURGERY Left    LEG SURGERY Left    femur fracture with rod   REVERSE SHOULDER ARTHROPLASTY Right 11/10/2019   Procedure: RIGHT REVERSE SHOULDER ARTHROPLASTY;  Surgeon: Cammy Copa, MD;  Location: [facility] OR;  Service: Orthopedics;  Laterality: Right;   TONSILLECTOMY     FAMILY HISTORY Family  History  Problem Relation Age of Onset   Hypertension Mother    Diabetes Brother    SOCIAL HISTORY Social History   Tobacco Use   Smoking status: Never   Smokeless tobacco: Never  Vaping Use   Vaping Use: Never used  Substance Use Topics   Alcohol use: Yes    Comment: 1 beer occasionally   Drug use: No       OPHTHALMIC EXAM: Base Eye Exam     Visual Acuity (Snellen - Linear)       Right Left   Dist cc 20/30 20/20 -2   Dist ph cc 20/25     Correction: Glasses         Tonometry (Tonopen, 2:31 PM)       Right Left   Pressure 12 13         Pupils       Dark Light Shape React APD   Right 3 2 Round Brisk None   Left 3 2 Round Brisk None         Visual Fields (Counting fingers)       Left Right    Full Full         Extraocular Movement       Right Left    Full, Ortho Full, Ortho         Neuro/Psych     Oriented x3: Yes   Mood/Affect: Normal         Dilation     Both eyes: 1.0% Mydriacyl, 2.5% Phenylephrine @ 2:31 PM           Slit Lamp and Fundus Exam     Slit Lamp Exam       Right Left   Lids/Lashes Dermatochalasis - upper lid, mild Meibomian gland dysfunction Dermatochalasis - upper lid, Dermatochalasis - lower lid, mild Meibomian gland dysfunction   Conjunctiva/Sclera blue sclera blue sclera   Cornea arcus, 1+ PEE, mild tear film debris arcus, SCH   Anterior Chamber deep and clear deep and clear   Iris round and dilated, no NVI round and dilated, no NVI   Lens 2-3+ NS w/brunescence, 2-3+CS 2-3+ NS w/brunesecence, 2-3+CS   Vitreous mild syneresis, PVD mild syneresis         Fundus Exam       Right Left   Disc pink and sharp pink and sharp   C/D Ratio 0.5 0.4   Macula Good foveal reflex; persistent, focal edema SN macula --  focal exudate superior macula - resolved, mild focal laser changes flat, good foveal reflex, mild RPE mottling and clumping, +drusen, No heme or edema   Vessels attenuated, Tortuous mild  attenuation  and tortuosity, mild AV crossing changes, mild copper wiring   Periphery attached, no heme attached, no heme           IMAGING AND PROCEDURES  Imaging and Procedures for  OCT, Retina - OU - Both Eyes       Right Eye Quality was good. Central Foveal Thickness: 234. Progression has been stable. Findings include no SRF, intraretinal fluid, retinal drusen , normal foveal contour (Persistent IRF/cystic changes superior macula).   Left Eye Quality was good. Central Foveal Thickness: 257. Progression has been stable. Findings include normal foveal contour, no IRF, no SRF, vitreomacular adhesion , retinal drusen (Focal PED/drusen).   Notes *Images captured and stored on drive  Diagnosis / Impression:  OD: BRVO with persistent IRF superior macula OS: NFP, no SRF/IRF, drusen, VMA  Clinical management:  See below  Abbreviations: NFP - Normal foveal profile. CME - cystoid macular edema. PED - pigment epithelial detachment. IRF - intraretinal fluid. SRF - subretinal fluid. EZ - ellipsoid zone. ERM - epiretinal membrane. ORA - outer retinal atrophy. ORT - outer retinal tubulation. SRHM - subretinal hyper-reflective material       Intravitreal Injection, Pharmacologic Agent - OD - Right Eye       Time Out 10/04/2021. 3:16 PM. Confirmed correct patient, procedure, site, and patient consented.   Anesthesia Topical anesthesia was used. Anesthetic medications included Lidocaine 2%, Proparacaine 0.5%.   Procedure Preparation included 5% betadine to ocular surface, eyelid speculum. A supplied needle was used.   Injection: 2 mg aflibercept 2 MG/0.05ML   Route: Intravitreal, Site: Right Eye   NDC: L6038910, Lot: [id number], Expiration date: 08/15/2022, Waste: 0.05 mL   Post-op Post injection exam found visual acuity of at least counting fingers. The patient tolerated the procedure well. There were no complications. The patient received written and verbal post procedure  care education. Post injection medications were not given.            ASSESSMENT/PLAN:   ICD-10-CM   1. Branch retinal vein occlusion of right eye with macular edema  H34.8310 Intravitreal Injection, Pharmacologic Agent - OD - Right Eye    aflibercept (EYLEA) SOLN 2 mg    2. Retinal edema  H35.81 OCT, Retina - OU - Both Eyes    3. Diabetes mellitus type 2 without retinopathy (HCC)  E11.9     4. Essential hypertension  I10     5. Hypertensive retinopathy of both eyes  H35.033     6. Combined forms of age-related cataract of both eyes  H25.813      1,2. BRVO with CME OD  - s/p IVA OD #1 (01.19.21), #2 (02.16.21), #3 (03.16.21), #4 (4.13.21) -- IVA resistance             - s/p IVE OD #1 (05.11.21--sample), #2 (06.08.21), #3 (07.06.21), #4 (8.3.21), #5 (08.31.21), #6 (09.28.21), #7 (10.26.21), #8 (11.23.21), #9 (12.22.21), #10 (1.24.22), #11 (2.22.22), #12 (3.22.22), #13 (04.19.22), #14 (05.25.22), #15 (06.29.22), #16 (07.27.22), #17 (08.24.22), #18 (09.21.22)  - s/p focal laser OD (07.13.22)  - BCVA 20/25 OD  - exam with focal edema, IRH, CWS superonasal macula -- improving  - OCT shows persistent IRF superior macula -- mild interval improvement  - recommend IVE OD #19 today (10.19.22)  - RBA of procedure discussed, questions answered  - informed consent obtained  - Avastin informed consent form signed and scanned on 01.19.21  - Eylea informed consent form signed and scanned on 05.11.21  - see procedure note  -  Eylea4U benefits investigation started, 05.11.21 -- approved through [employer] through 12.31.22  - F/U 4 wks -- DFE/OCT/possible injection  3. Diabetes mellitus, type 2 without retinopathy OU  - The incidence, risk factors for progression, natural history and treatment options for diabetic retinopathy  were discussed with patient.    - The need for close monitoring of blood glucose, blood pressure, and serum lipids, avoiding cigarette or any type of tobacco, and the need  for long term follow up was also discussed with patient.  - f/u in 1 year, sooner prn  4,5. Hypertensive retinopathy OU  - discussed importance of tight BP control  - monitor  6. Age related cataracts OU   - The symptoms of cataract, surgical options, and treatments and risks were discussed with patient.  - discussed diagnosis and progression  - not yet visually significant  - monitor for now  Ophthalmic Meds Ordered this visit:  Meds ordered this encounter  Medications   aflibercept (EYLEA) SOLN 2 mg     Return in about 4 weeks (around 11/01/2021) for 4 weeks BRVO OD.  There are no Patient Instructions on file for this visit.  This document serves as a record of services personally performed by Karie Chimera, MD, PhD. It was created on their behalf by De Blanch, an ophthalmic technician. The creation of this record is the provider's dictation and/or activities during the visit.    Electronically signed by: De Blanch, OA, 10/06/21  12:41 PM  Karie Chimera, M.D., Ph.D. Diseases & Surgery of the Retina and Vitreous [facility]  I have reviewed the above documentation for accuracy and completeness, and I agree with the above. Karie Chimera, M.D., Ph.D. 10/06/21 12:41 PM  Abbreviations: M myopia (nearsighted); A astigmatism; H hyperopia (farsighted); P presbyopia; Mrx spectacle prescription;  CTL contact lenses; OD right eye; OS left eye; OU both eyes  XT exotropia; ET esotropia; PEK punctate epithelial keratitis; PEE punctate epithelial erosions; DES dry eye syndrome; MGD meibomian gland dysfunction; ATs artificial tears; PFAT's preservative free artificial tears; NSC nuclear sclerotic cataract; PSC posterior subcapsular cataract; ERM epi-retinal membrane; PVD posterior vitreous detachment; RD retinal detachment; DM diabetes mellitus; DR diabetic retinopathy; NPDR non-proliferative diabetic retinopathy; PDR proliferative diabetic retinopathy;  CSME clinically significant macular edema; DME diabetic macular edema; dbh dot blot hemorrhages; CWS cotton wool spot; POAG primary open angle glaucoma; C/D cup-to-disc ratio; HVF humphrey visual field; GVF goldmann visual field; OCT optical coherence tomography; IOP intraocular pressure; BRVO Branch retinal vein occlusion; CRVO central retinal vein occlusion; CRAO central retinal artery occlusion; BRAO branch retinal artery occlusion; RT retinal tear; SB scleral buckle; PPV pars plana vitrectomy; VH Vitreous hemorrhage; PRP panretinal laser photocoagulation; IVK intravitreal kenalog; VMT vitreomacular traction; MH Macular hole;  NVD neovascularization of the disc; NVE neovascularization elsewhere; AREDS age related eye disease study; ARMD age related macular degeneration; POAG primary open angle glaucoma; EBMD epithelial/anterior basement membrane dystrophy; ACIOL anterior chamber intraocular lens; IOL intraocular lens; PCIOL posterior chamber intraocular lens; Phaco/IOL phacoemulsification with intraocular lens placement; PRK photorefractive keratectomy; LASIK laser assisted in situ keratomileusis; HTN hypertension; DM diabetes mellitus; COPD chronic obstructive pulmonary disease

## 2021-10-04 ENCOUNTER — Ambulatory Visit (INDEPENDENT_AMBULATORY_CARE_PROVIDER_SITE_OTHER): Payer: Medicare Other | Admitting: Ophthalmology

## 2021-10-04 ENCOUNTER — Other Ambulatory Visit: Payer: Self-pay

## 2021-10-04 ENCOUNTER — Encounter (INDEPENDENT_AMBULATORY_CARE_PROVIDER_SITE_OTHER): Payer: Self-pay | Admitting: Ophthalmology

## 2021-10-04 DIAGNOSIS — H3581 Retinal edema: Secondary | ICD-10-CM

## 2021-10-04 DIAGNOSIS — H34831 Tributary (branch) retinal vein occlusion, right eye, with macular edema: Secondary | ICD-10-CM

## 2021-10-04 DIAGNOSIS — E119 Type 2 diabetes mellitus without complications: Secondary | ICD-10-CM | POA: Diagnosis not present

## 2021-10-04 DIAGNOSIS — I10 Essential (primary) hypertension: Secondary | ICD-10-CM

## 2021-10-04 DIAGNOSIS — H35033 Hypertensive retinopathy, bilateral: Secondary | ICD-10-CM

## 2021-10-04 DIAGNOSIS — H25813 Combined forms of age-related cataract, bilateral: Secondary | ICD-10-CM

## 2021-10-06 ENCOUNTER — Encounter (INDEPENDENT_AMBULATORY_CARE_PROVIDER_SITE_OTHER): Payer: Self-pay | Admitting: Ophthalmology

## 2021-10-06 MED ORDER — AFLIBERCEPT 2MG/0.05ML IZ SOLN FOR KALEIDOSCOPE
2.0000 mg | INTRAVITREAL | Status: AC | PRN
  Administered 2021-10-04: 2 mg via INTRAVITREAL

## 2021-10-11 ENCOUNTER — Ambulatory Visit (HOSPITAL_COMMUNITY): Payer: Medicare Other | Admitting: Physical Therapy

## 2021-10-12 ENCOUNTER — Ambulatory Visit (HOSPITAL_COMMUNITY): Payer: Medicare Other | Admitting: Physical Therapy

## 2021-10-17 ENCOUNTER — Ambulatory Visit (HOSPITAL_COMMUNITY): Payer: Medicare Other

## 2021-10-19 ENCOUNTER — Ambulatory Visit (HOSPITAL_COMMUNITY): Payer: Medicare Other

## 2021-10-24 ENCOUNTER — Ambulatory Visit (HOSPITAL_COMMUNITY): Payer: Medicare Other | Admitting: Physical Therapy

## 2021-10-26 ENCOUNTER — Ambulatory Visit (HOSPITAL_COMMUNITY): Payer: Medicare Other

## 2021-10-31 ENCOUNTER — Ambulatory Visit (HOSPITAL_COMMUNITY): Payer: Medicare Other | Admitting: Physical Therapy

## 2021-11-02 ENCOUNTER — Ambulatory Visit (HOSPITAL_COMMUNITY): Payer: Medicare Other | Admitting: Physical Therapy

## 2021-11-03 NOTE — Progress Notes (Signed)
[facility] - Clinic Note  11/07/2021     CHIEF COMPLAINT Patient presents for Retina Follow Up   HISTORY OF PRESENT ILLNESS: Kurt [name] is a 70 y.o. male who presents to the clinic today for:  HPI     Retina Follow Up   Patient presents with  CRVO/BRVO.  In right eye.  This started years ago.  Severity is moderate.  Duration of 4.5 weeks.  Since onset it is stable.  I, the attending physician,  performed the HPI with the patient and updated documentation appropriately.        Comments   70 y/o male pt here for 4.5 wk f/u for BRVO OD.  No change in [place] OU noticed.  Denies pain, FOL, floaters.  No gtts.  BS averaging around 190.  A1C 7.2, down from 8.2.      Last edited by Rennis Chris, MD on 11/07/2021  4:46 PM.     Referring physician:  Aura Camps, MD [address] [place],  [place] [address]  HISTORICAL INFORMATION:   Selected notes from the MEDICAL RECORD NUMBER Diabetic Eval per Dr. Karleen Hampshire   CURRENT MEDICATIONS: No current outpatient medications on file. (Ophthalmic Drugs)   No current facility-administered medications for this visit. (Ophthalmic Drugs)   Current Outpatient Medications (Other)  Medication Sig   acetaminophen (TYLENOL) 650 MG CR tablet Take 1,300 mg by mouth every 8 (eight) hours as needed for pain.   aspirin 81 MG chewable tablet Chew 1 tablet (81 mg total) by mouth daily.   B-D ULTRAFINE III SHORT PEN 31G X 8 MM MISC Inject into the skin.   cephALEXin (KEFLEX) 500 MG capsule 1 capsule   clindamycin (CLEOCIN) 300 MG capsule    ELIQUIS 2.5 MG TABS tablet Take 2.5 mg by mouth 2 (two) times daily.   glipiZIDE (GLUCOTROL XL) 10 MG 24 hr tablet Take 10 mg by mouth at bedtime.    HYDROcodone-acetaminophen (NORCO) 7.5-325 MG tablet 1 tablet as needed   ketoconazole (NIZORAL) 2 % cream 1 application to feet and in between toes   ketoconazole (NIZORAL) 2 % cream    LANTUS SOLOSTAR 100 UNIT/ML Solostar Pen  Inject 34 Units into the skin at bedtime.   lisinopril (PRINIVIL,ZESTRIL) 20 MG tablet Take 20 mg by mouth daily.   lisinopril (ZESTRIL) 20 MG tablet    methocarbamol (ROBAXIN) 500 MG tablet Take 500 mg by mouth at bedtime as needed for muscle spasms.   ONETOUCH ULTRA test strip 1 each 2 (two) times daily.   oxyCODONE (OXY IR/ROXICODONE) 5 MG immediate release tablet Take 1 tablet (5 mg total) by mouth every 8 (eight) hours as needed for moderate pain (pain score 4-6).   simvastatin (ZOCOR) 20 MG tablet Take 20 mg by mouth every evening.   simvastatin (ZOCOR) 20 MG tablet    SitaGLIPtin-MetFORMIN HCl [phone] MG TB24 Take 1 tablet by mouth at bedtime.   No current facility-administered medications for this visit. (Other)   REVIEW OF SYSTEMS: ROS   Positive for: Musculoskeletal, Endocrine, Eyes Negative for: Constitutional, Gastrointestinal, Neurological, Skin, Genitourinary, HENT, Cardiovascular, Respiratory, Psychiatric, Allergic/Imm, Heme/Lymph Last edited by Celine Mans, COA on 11/07/2021  2:54 PM.      ALLERGIES Allergies  Allergen Reactions   Augmentin [Amoxicillin-Pot Clavulanate] Nausea And Vomiting   Iodine Hives   Tape Rash    Paper    PAST MEDICAL HISTORY Past Medical History:  Diagnosis Date   Arthritis  Bronchitis    Bronchitis    Cataract    OU   Concussion    late 1990's  after a fall   GERD (gastroesophageal reflux disease)    History of kidney stones    Hypertension    Hypertensive retinopathy    OU   Osteogenesis imperfecta    PONV (postoperative nausea and vomiting)    pt has post polio syndrome   Post-polio syndrome    Type 2 diabetes mellitus (HCC) 12/25/2018   Past Surgical History:  Procedure Laterality Date   ANKLE FRACTURE SURGERY Bilateral    arm surgery Right    nerve surgery   COLONOSCOPY     ELBOW FRACTURE SURGERY Left    EYE MUSCLE SURGERY Left    LEG SURGERY Left    femur fracture with rod   REVERSE SHOULDER ARTHROPLASTY  Right 11/10/2019   Procedure: RIGHT REVERSE SHOULDER ARTHROPLASTY;  Surgeon: Cammy Copa, MD;  Location: [facility] OR;  Service: Orthopedics;  Laterality: Right;   TONSILLECTOMY     FAMILY HISTORY Family History  Problem Relation Age of Onset   Hypertension Mother    Diabetes Brother    SOCIAL HISTORY Social History   Tobacco Use   Smoking status: Never   Smokeless tobacco: Never  Vaping Use   Vaping Use: Never used  Substance Use Topics   Alcohol use: Yes    Comment: 1 beer occasionally   Drug use: No       OPHTHALMIC EXAM: Base Eye Exam     Visual Acuity (Snellen - Linear)       Right Left   Dist cc 20/30 -2 20/20 -   Dist ph cc 20/20 -2     Correction: Glasses         Tonometry (Tonopen, 2:56 PM)       Right Left   Pressure 10 12         Pupils       Dark Light Shape React APD   Right 3 2 Round Brisk None   Left 3 2 Round Brisk None         Visual Fields (Counting fingers)       Left Right    Full Full         Extraocular Movement       Right Left    Full Full         Neuro/Psych     Oriented x3: Yes   Mood/Affect: Normal         Dilation     Both eyes: 1.0% Mydriacyl, 2.5% Phenylephrine @ 2:56 PM           Slit Lamp and Fundus Exam     Slit Lamp Exam       Right Left   Lids/Lashes Dermatochalasis - upper lid, mild Meibomian gland dysfunction Dermatochalasis - upper lid, Dermatochalasis - lower lid, mild Meibomian gland dysfunction   Conjunctiva/Sclera blue sclera blue sclera   Cornea arcus, 1+ PEE, mild tear film debris arcus, SCH   Anterior Chamber deep and clear deep and clear   Iris round and dilated, no NVI round and dilated, no NVI   Lens 2-3+ NS w/brunescence, 2-3+CS 2-3+ NS w/brunesecence, 2-3+CS   Anterior Vitreous mild syneresis, PVD mild syneresis         Fundus Exam       Right Left   Disc pink and sharp pink and sharp   C/D Ratio 0.5 0.4   Macula  Good foveal reflex; persistent, focal edema  SN macula, focal exudate superior macula - resolved, mild focal laser changes flat, good foveal reflex, mild RPE mottling and clumping, +drusen, No heme or edema   Vessels attenuated, Tortuous mild attenuation and tortuosity, mild AV crossing changes, mild copper wiring   Periphery attached, no heme attached, no heme           IMAGING AND PROCEDURES  Imaging and Procedures for  OCT, Retina - OU - Both Eyes       Right Eye Quality was good. Central Foveal Thickness: 237. Progression has improved. Findings include no SRF, intraretinal fluid, retinal drusen , normal foveal contour (Mild interval improvement in IRF/cystic changes superior macula).   Left Eye Quality was good. Central Foveal Thickness: 260. Progression has been stable. Findings include normal foveal contour, no IRF, no SRF, vitreomacular adhesion , retinal drusen (Focal PED/drusen).   Notes *Images captured and stored on drive  Diagnosis / Impression:  OD: BRVO with Mild interval improvement in IRF/cystic changes superior macula OS: NFP, no SRF/IRF, drusen, VMA  Clinical management:  See below  Abbreviations: NFP - Normal foveal profile. CME - cystoid macular edema. PED - pigment epithelial detachment. IRF - intraretinal fluid. SRF - subretinal fluid. EZ - ellipsoid zone. ERM - epiretinal membrane. ORA - outer retinal atrophy. ORT - outer retinal tubulation. SRHM - subretinal hyper-reflective material       Intravitreal Injection, Pharmacologic Agent - OD - Right Eye       Time Out 11/07/2021. 3:16 PM. Confirmed correct patient, procedure, site, and patient consented.   Anesthesia Topical anesthesia was used. Anesthetic medications included Lidocaine 2%, Proparacaine 0.5%.   Procedure Preparation included 5% betadine to ocular surface, eyelid speculum. A (32g) needle was used.   Injection: 2 mg aflibercept 2 MG/0.05ML   Route: Intravitreal, Site: Right Eye   NDC: [id number], Lot: [id number],  Expiration date: 09/15/2022, Waste: 0.05 mL   Post-op Post injection exam found visual acuity of at least counting fingers. The patient tolerated the procedure well. There were no complications. The patient received written and verbal post procedure care education. Post injection medications were not given.            ASSESSMENT/PLAN:   ICD-10-CM   1. Branch retinal vein occlusion of right eye with macular edema  H34.8310 Intravitreal Injection, Pharmacologic Agent - OD - Right Eye    aflibercept (EYLEA) SOLN 2 mg    2. Retinal edema  H35.81 OCT, Retina - OU - Both Eyes    3. Diabetes mellitus type 2 without retinopathy (HCC)  E11.9     4. Essential hypertension  I10     5. Hypertensive retinopathy of both eyes  H35.033     6. Combined forms of age-related cataract of both eyes  H25.813       1,2. BRVO with CME OD  - s/p IVA OD #1 (01.19.21), #2 (02.16.21), #3 (03.16.21), #4 (4.13.21) -- IVA resistance             - s/p IVE OD #1 (05.11.21--sample), #2 (06.08.21), #3 (07.06.21), #4 (8.3.21), #5 (08.31.21), #6 (09.28.21), #7 (10.26.21), #8 (11.23.21), #9 (12.22.21), #10 (1.24.22), #11 (2.22.22), #12 (3.22.22), #13 (04.19.22), #14 (05.25.22), #15 (06.29.22), #16 (07.27.22), #17 (08.24.22), #18 (09.21.22), #19 (10.19.22)  - s/p focal laser OD (07.13.22)  - BCVA 20/20 OD  - exam with focal edema, IRH, CWS superonasal macula -- improving  - OCT shows Mild interval improvement in IRF/cystic changes superior macula at  4.5 wks  - recommend IVE OD #20 today (11.22.22) w/ f/u in 4-5 wks  - RBA of procedure discussed, questions answered  - informed consent obtained  - Avastin informed consent form signed and scanned on 01.19.21  - Eylea informed consent form signed and scanned on 05.11.21  - see procedure note  - Eylea4U benefits investigation started, 05.11.21 -- approved through [employer] through 12.31.22  - F/U 4-5 wks -- DFE/OCT/possible injection  3. Diabetes mellitus, type 2  without retinopathy OU  - The incidence, risk factors for progression, natural history and treatment options for diabetic retinopathy  were discussed with patient.    - The need for close monitoring of blood glucose, blood pressure, and serum lipids, avoiding cigarette or any type of tobacco, and the need for long term follow up was also discussed with patient.  - f/u in 1 year, sooner prn  4,5. Hypertensive retinopathy OU  - discussed importance of tight BP control  - monitor  6. Age related cataracts OU   - The symptoms of cataract, surgical options, and treatments and risks were discussed with patient.  - discussed diagnosis and progression  - not yet visually significant  - monitor for now  Ophthalmic Meds Ordered this visit:  Meds ordered this encounter  Medications   aflibercept (EYLEA) SOLN 2 mg      Return for f/u 4-5 weeks, BRVO OD, DFE, OCT.  There are no Patient Instructions on file for this visit.  This document serves as a record of services personally performed by Karie Chimera, MD, PhD. It was created on their behalf by Cristopher Estimable, COT an ophthalmic technician. The creation of this record is the provider's dictation and/or activities during the visit.    Electronically signed by: Cristopher Estimable, [place] 11.18.22 @ 4:48 PM   Karie Chimera, M.D., Ph.D. Diseases & Surgery of the Retina and Vitreous [facility]  I have reviewed the above documentation for accuracy and completeness, and I agree with the above. Karie Chimera, M.D., Ph.D. 11/07/21 4:48 PM   Abbreviations: M myopia (nearsighted); A astigmatism; H hyperopia (farsighted); P presbyopia; Mrx spectacle prescription;  CTL contact lenses; OD right eye; OS left eye; OU both eyes  XT exotropia; ET esotropia; PEK punctate epithelial keratitis; PEE punctate epithelial erosions; DES dry eye syndrome; MGD meibomian gland dysfunction; ATs artificial tears; PFAT's preservative free artificial  tears; NSC nuclear sclerotic cataract; PSC posterior subcapsular cataract; ERM epi-retinal membrane; PVD posterior vitreous detachment; RD retinal detachment; DM diabetes mellitus; DR diabetic retinopathy; NPDR non-proliferative diabetic retinopathy; PDR proliferative diabetic retinopathy; CSME clinically significant macular edema; DME diabetic macular edema; dbh dot blot hemorrhages; CWS cotton wool spot; POAG primary open angle glaucoma; C/D cup-to-disc ratio; HVF humphrey visual field; GVF goldmann visual field; OCT optical coherence tomography; IOP intraocular pressure; BRVO Branch retinal vein occlusion; CRVO central retinal vein occlusion; CRAO central retinal artery occlusion; BRAO branch retinal artery occlusion; RT retinal tear; SB scleral buckle; PPV pars plana vitrectomy; VH Vitreous hemorrhage; PRP panretinal laser photocoagulation; IVK intravitreal kenalog; VMT vitreomacular traction; MH Macular hole;  NVD neovascularization of the disc; NVE neovascularization elsewhere; AREDS age related eye disease study; ARMD age related macular degeneration; POAG primary open angle glaucoma; EBMD epithelial/anterior basement membrane dystrophy; ACIOL anterior chamber intraocular lens; IOL intraocular lens; PCIOL posterior chamber intraocular lens; Phaco/IOL phacoemulsification with intraocular lens placement; PRK photorefractive keratectomy; LASIK laser assisted in situ keratomileusis; HTN hypertension; DM diabetes mellitus; COPD chronic obstructive  pulmonary disease

## 2021-11-06 ENCOUNTER — Ambulatory Visit (HOSPITAL_COMMUNITY): Payer: Medicare Other | Admitting: Physical Therapy

## 2021-11-07 ENCOUNTER — Ambulatory Visit (INDEPENDENT_AMBULATORY_CARE_PROVIDER_SITE_OTHER): Payer: Medicare Other | Admitting: Ophthalmology

## 2021-11-07 ENCOUNTER — Encounter (INDEPENDENT_AMBULATORY_CARE_PROVIDER_SITE_OTHER): Payer: Self-pay | Admitting: Ophthalmology

## 2021-11-07 ENCOUNTER — Other Ambulatory Visit: Payer: Self-pay

## 2021-11-07 DIAGNOSIS — H25813 Combined forms of age-related cataract, bilateral: Secondary | ICD-10-CM

## 2021-11-07 DIAGNOSIS — H34831 Tributary (branch) retinal vein occlusion, right eye, with macular edema: Secondary | ICD-10-CM | POA: Diagnosis not present

## 2021-11-07 DIAGNOSIS — H35033 Hypertensive retinopathy, bilateral: Secondary | ICD-10-CM

## 2021-11-07 DIAGNOSIS — I10 Essential (primary) hypertension: Secondary | ICD-10-CM | POA: Diagnosis not present

## 2021-11-07 DIAGNOSIS — E119 Type 2 diabetes mellitus without complications: Secondary | ICD-10-CM

## 2021-11-07 DIAGNOSIS — H3581 Retinal edema: Secondary | ICD-10-CM

## 2021-11-07 MED ORDER — AFLIBERCEPT 2MG/0.05ML IZ SOLN FOR KALEIDOSCOPE
2.0000 mg | INTRAVITREAL | Status: AC | PRN
  Administered 2021-11-07: 2 mg via INTRAVITREAL

## 2021-11-08 ENCOUNTER — Ambulatory Visit (HOSPITAL_COMMUNITY): Payer: Medicare Other | Admitting: Physical Therapy

## 2021-11-14 ENCOUNTER — Ambulatory Visit (HOSPITAL_COMMUNITY): Payer: Medicare Other

## 2021-11-16 ENCOUNTER — Ambulatory Visit (HOSPITAL_COMMUNITY): Payer: Medicare Other | Admitting: Physical Therapy

## 2021-11-30 ENCOUNTER — Telehealth (INDEPENDENT_AMBULATORY_CARE_PROVIDER_SITE_OTHER): Payer: Self-pay

## 2021-11-30 NOTE — Telephone Encounter (Signed)
error 

## 2021-12-01 NOTE — Progress Notes (Shared)
[facility] - Clinic Note  12/04/2021     CHIEF COMPLAINT Patient presents for No chief complaint on file.    HISTORY OF PRESENT ILLNESS: Kurt [name] is a 70 y.o. male who presents to the clinic today for:    Referring physician:  [facility] [address] [place],  [place] [address]  HISTORICAL INFORMATION:   Selected notes from the MEDICAL RECORD NUMBER Diabetic Eval per Dr. Karleen Hampshire   CURRENT MEDICATIONS: No current outpatient medications on file. (Ophthalmic Drugs)   No current facility-administered medications for this visit. (Ophthalmic Drugs)   Current Outpatient Medications (Other)  Medication Sig   acetaminophen (TYLENOL) 650 MG CR tablet Take 1,300 mg by mouth every 8 (eight) hours as needed for pain.   aspirin 81 MG chewable tablet Chew 1 tablet (81 mg total) by mouth daily.   B-D ULTRAFINE III SHORT PEN 31G X 8 MM MISC Inject into the skin.   cephALEXin (KEFLEX) 500 MG capsule 1 capsule   clindamycin (CLEOCIN) 300 MG capsule    ELIQUIS 2.5 MG TABS tablet Take 2.5 mg by mouth 2 (two) times daily.   glipiZIDE (GLUCOTROL XL) 10 MG 24 hr tablet Take 10 mg by mouth at bedtime.    HYDROcodone-acetaminophen (NORCO) 7.5-325 MG tablet 1 tablet as needed   ketoconazole (NIZORAL) 2 % cream 1 application to feet and in between toes   ketoconazole (NIZORAL) 2 % cream    LANTUS SOLOSTAR 100 UNIT/ML Solostar Pen Inject 34 Units into the skin at bedtime.   lisinopril (PRINIVIL,ZESTRIL) 20 MG tablet Take 20 mg by mouth daily.   lisinopril (ZESTRIL) 20 MG tablet    methocarbamol (ROBAXIN) 500 MG tablet Take 500 mg by mouth at bedtime as needed for muscle spasms.   ONETOUCH ULTRA test strip 1 each 2 (two) times daily.   oxyCODONE (OXY IR/ROXICODONE) 5 MG immediate release tablet Take 1 tablet (5 mg total) by mouth every 8 (eight) hours as needed for moderate pain (pain score 4-6).   simvastatin (ZOCOR) 20 MG tablet Take 20 mg by  mouth every evening.   simvastatin (ZOCOR) 20 MG tablet    SitaGLIPtin-MetFORMIN HCl [phone] MG TB24 Take 1 tablet by mouth at bedtime.   No current facility-administered medications for this visit. (Other)   REVIEW OF SYSTEMS:    ALLERGIES Allergies  Allergen Reactions   Augmentin [Amoxicillin-Pot Clavulanate] Nausea And Vomiting   Iodine Hives   Tape Rash    Paper    PAST MEDICAL HISTORY Past Medical History:  Diagnosis Date   Arthritis    Bronchitis    Bronchitis    Cataract    OU   Concussion    late 1990's  after a fall   GERD (gastroesophageal reflux disease)    History of kidney stones    Hypertension    Hypertensive retinopathy    OU   Osteogenesis imperfecta    PONV (postoperative nausea and vomiting)    pt has post polio syndrome   Post-polio syndrome    Type 2 diabetes mellitus (HCC) 12/25/2018   Past Surgical History:  Procedure Laterality Date   ANKLE FRACTURE SURGERY Bilateral    arm surgery Right    nerve surgery   COLONOSCOPY     ELBOW FRACTURE SURGERY Left    EYE MUSCLE SURGERY Left    LEG SURGERY Left    femur fracture with rod   REVERSE SHOULDER ARTHROPLASTY Right 11/10/2019   Procedure:  RIGHT REVERSE SHOULDER ARTHROPLASTY;  Surgeon: Cammy Copa, MD;  Location: [facility] OR;  Service: Orthopedics;  Laterality: Right;   TONSILLECTOMY     FAMILY HISTORY Family History  Problem Relation Age of Onset   Hypertension Mother    Diabetes Brother    SOCIAL HISTORY Social History   Tobacco Use   Smoking status: Never   Smokeless tobacco: Never  Vaping Use   Vaping Use: Never used  Substance Use Topics   Alcohol use: Yes    Comment: 1 beer occasionally   Drug use: No       OPHTHALMIC EXAM: Not recorded    IMAGING AND PROCEDURES  Imaging and Procedures for          ASSESSMENT/PLAN:   ICD-10-CM   1. Branch retinal vein occlusion of right eye with macular edema  H34.8310     2. Cystoid macular edema of right eye  H35.351      3. Diabetes mellitus type 2 without retinopathy (HCC)  E11.9     4. Essential hypertension  I10     5. Hypertensive retinopathy of both eyes  H35.033     6. Combined forms of age-related cataract of both eyes  H25.813        1,2. BRVO with CME OD  - s/p IVA OD #1 (01.19.21), #2 (02.16.21), #3 (03.16.21), #4 (4.13.21) -- IVA resistance             - s/p IVE OD #1 (05.11.21--sample), #2 (06.08.21), #3 (07.06.21), #4 (8.3.21), #5 (08.31.21), #6 (09.28.21), #7 (10.26.21), #8 (11.23.21), #9 (12.22.21), #10 (1.24.22), #11 (2.22.22), #12 (3.22.22), #13 (04.19.22), #14 (05.25.22), #15 (06.29.22), #16 (07.27.22), #17 (08.24.22), #18 (09.21.22), #19 (10.19.22), #20 (11.22.22)  - s/p focal laser OD (07.13.22)  - BCVA 20/20 OD  - exam with focal edema, IRH, CWS superonasal macula -- improving  - OCT shows Mild interval improvement in IRF/cystic changes superior macula at 4.5 wks  - recommend IVE OD #21 today (12.19.22) w/ f/u in 4-5 wks  - RBA of procedure discussed, questions answered  - informed consent obtained  - Avastin informed consent form signed and scanned on 01.19.21  - Eylea informed consent form signed and scanned on 05.11.21  - see procedure note  - Eylea4U benefits investigation started, 05.11.21 -- approved through [employer] through 12.31.22  - F/U 4-5 wks -- DFE/OCT/possible injection  3. Diabetes mellitus, type 2 without retinopathy OU  - The incidence, risk factors for progression, natural history and treatment options for diabetic retinopathy  were discussed with patient.    - The need for close monitoring of blood glucose, blood pressure, and serum lipids, avoiding cigarette or any type of tobacco, and the need for long term follow up was also discussed with patient.  - f/u in 1 year, sooner prn  4,5. Hypertensive retinopathy OU  - discussed importance of tight BP control  - monitor  6. Age related cataracts OU   - The symptoms of cataract, surgical options, and  treatments and risks were discussed with patient.  - discussed diagnosis and progression  - not yet visually significant  - monitor for now  Ophthalmic Meds Ordered this visit:  No orders of the defined types were placed in this encounter.     No follow-ups on file.  There are no Patient Instructions on file for this visit.  This document serves as a record of services personally performed by Karie Chimera, MD, PhD. It was created on their behalf  by Annalee Genta, COMT. The creation of this record is the provider's dictation and/or activities during the visit.  Electronically signed by: Annalee Genta, COMT 12/01/21 10:47 AM    Karie Chimera, M.D., Ph.D. Diseases & Surgery of the Retina and Vitreous [facility]     Abbreviations: M myopia (nearsighted); A astigmatism; H hyperopia (farsighted); P presbyopia; Mrx spectacle prescription;  CTL contact lenses; OD right eye; OS left eye; OU both eyes  XT exotropia; ET esotropia; PEK punctate epithelial keratitis; PEE punctate epithelial erosions; DES dry eye syndrome; MGD meibomian gland dysfunction; ATs artificial tears; PFAT's preservative free artificial tears; NSC nuclear sclerotic cataract; PSC posterior subcapsular cataract; ERM epi-retinal membrane; PVD posterior vitreous detachment; RD retinal detachment; DM diabetes mellitus; DR diabetic retinopathy; NPDR non-proliferative diabetic retinopathy; PDR proliferative diabetic retinopathy; CSME clinically significant macular edema; DME diabetic macular edema; dbh dot blot hemorrhages; CWS cotton wool spot; POAG primary open angle glaucoma; C/D cup-to-disc ratio; HVF humphrey visual field; GVF goldmann visual field; OCT optical coherence tomography; IOP intraocular pressure; BRVO Branch retinal vein occlusion; CRVO central retinal vein occlusion; CRAO central retinal artery occlusion; BRAO branch retinal artery occlusion; RT retinal tear; SB scleral buckle; PPV pars  plana vitrectomy; VH Vitreous hemorrhage; PRP panretinal laser photocoagulation; IVK intravitreal kenalog; VMT vitreomacular traction; MH Macular hole;  NVD neovascularization of the disc; NVE neovascularization elsewhere; AREDS age related eye disease study; ARMD age related macular degeneration; POAG primary open angle glaucoma; EBMD epithelial/anterior basement membrane dystrophy; ACIOL anterior chamber intraocular lens; IOL intraocular lens; PCIOL posterior chamber intraocular lens; Phaco/IOL phacoemulsification with intraocular lens placement; PRK photorefractive keratectomy; LASIK laser assisted in situ keratomileusis; HTN hypertension; DM diabetes mellitus; COPD chronic obstructive pulmonary disease

## 2021-12-04 ENCOUNTER — Encounter (INDEPENDENT_AMBULATORY_CARE_PROVIDER_SITE_OTHER): Payer: Medicare Other | Admitting: Ophthalmology

## 2021-12-04 DIAGNOSIS — H35033 Hypertensive retinopathy, bilateral: Secondary | ICD-10-CM

## 2021-12-04 DIAGNOSIS — I10 Essential (primary) hypertension: Secondary | ICD-10-CM

## 2021-12-04 DIAGNOSIS — H35351 Cystoid macular degeneration, right eye: Secondary | ICD-10-CM

## 2021-12-04 DIAGNOSIS — H34831 Tributary (branch) retinal vein occlusion, right eye, with macular edema: Secondary | ICD-10-CM

## 2021-12-04 DIAGNOSIS — H25813 Combined forms of age-related cataract, bilateral: Secondary | ICD-10-CM

## 2021-12-04 DIAGNOSIS — E119 Type 2 diabetes mellitus without complications: Secondary | ICD-10-CM

## 2021-12-05 ENCOUNTER — Other Ambulatory Visit: Payer: Self-pay

## 2021-12-05 ENCOUNTER — Encounter (INDEPENDENT_AMBULATORY_CARE_PROVIDER_SITE_OTHER): Payer: Self-pay | Admitting: Ophthalmology

## 2021-12-05 ENCOUNTER — Ambulatory Visit (INDEPENDENT_AMBULATORY_CARE_PROVIDER_SITE_OTHER): Payer: Medicare Other | Admitting: Ophthalmology

## 2021-12-05 DIAGNOSIS — H25813 Combined forms of age-related cataract, bilateral: Secondary | ICD-10-CM

## 2021-12-05 DIAGNOSIS — H34831 Tributary (branch) retinal vein occlusion, right eye, with macular edema: Secondary | ICD-10-CM | POA: Diagnosis not present

## 2021-12-05 DIAGNOSIS — H35033 Hypertensive retinopathy, bilateral: Secondary | ICD-10-CM | POA: Diagnosis not present

## 2021-12-05 DIAGNOSIS — I10 Essential (primary) hypertension: Secondary | ICD-10-CM

## 2021-12-05 DIAGNOSIS — E119 Type 2 diabetes mellitus without complications: Secondary | ICD-10-CM

## 2021-12-05 MED ORDER — AFLIBERCEPT 2MG/0.05ML IZ SOLN FOR KALEIDOSCOPE
2.0000 mg | INTRAVITREAL | Status: AC | PRN
  Administered 2021-12-05: 16:00:00 2 mg via INTRAVITREAL

## 2021-12-05 NOTE — Progress Notes (Signed)
[facility] - Clinic Note  12/05/2021     CHIEF COMPLAINT Patient presents for Retina Follow Up  HISTORY OF PRESENT ILLNESS: Kurt [name] is a 70 y.o. male who presents to the clinic today for:  HPI     Retina Follow Up   Patient presents with  CRVO/BRVO.  In right eye.  This started years ago.  Severity is moderate.  Duration of 4 weeks.  Since onset it is stable.  I, the attending physician,  performed the HPI with the patient and updated documentation appropriately.        Comments   70 y/o male pt here for 4 wk f/u for BRVO w/CME OD.  No change in [place] OU.  Denies pain, FOL, floaters.  No gtts.      Last edited by Rennis Chris, MD on 12/05/2021  4:25 PM.     Referring physician:  Aura Camps, MD [address] [place],  [place] [address]  HISTORICAL INFORMATION:   Selected notes from the MEDICAL RECORD NUMBER Diabetic Eval per Dr. Karleen Hampshire   CURRENT MEDICATIONS: No current outpatient medications on file. (Ophthalmic Drugs)   No current facility-administered medications for this visit. (Ophthalmic Drugs)   Current Outpatient Medications (Other)  Medication Sig   acetaminophen (TYLENOL) 650 MG CR tablet Take 1,300 mg by mouth every 8 (eight) hours as needed for pain.   aspirin 81 MG chewable tablet Chew 1 tablet (81 mg total) by mouth daily.   B-D ULTRAFINE III SHORT PEN 31G X 8 MM MISC Inject into the skin.   cephALEXin (KEFLEX) 500 MG capsule 1 capsule   clindamycin (CLEOCIN) 300 MG capsule    ELIQUIS 2.5 MG TABS tablet Take 2.5 mg by mouth 2 (two) times daily.   glipiZIDE (GLUCOTROL XL) 10 MG 24 hr tablet Take 10 mg by mouth at bedtime.    HYDROcodone-acetaminophen (NORCO) 7.5-325 MG tablet 1 tablet as needed   ketoconazole (NIZORAL) 2 % cream 1 application to feet and in between toes   ketoconazole (NIZORAL) 2 % cream    LANTUS SOLOSTAR 100 UNIT/ML Solostar Pen Inject 34 Units into the skin at bedtime.   lisinopril  (PRINIVIL,ZESTRIL) 20 MG tablet Take 20 mg by mouth daily.   lisinopril (ZESTRIL) 20 MG tablet    methocarbamol (ROBAXIN) 500 MG tablet Take 500 mg by mouth at bedtime as needed for muscle spasms.   ONETOUCH ULTRA test strip 1 each 2 (two) times daily.   oxyCODONE (OXY IR/ROXICODONE) 5 MG immediate release tablet Take 1 tablet (5 mg total) by mouth every 8 (eight) hours as needed for moderate pain (pain score 4-6).   simvastatin (ZOCOR) 20 MG tablet Take 20 mg by mouth every evening.   simvastatin (ZOCOR) 20 MG tablet    SitaGLIPtin-MetFORMIN HCl 614 280 8078 MG TB24 Take 1 tablet by mouth at bedtime.   No current facility-administered medications for this visit. (Other)   REVIEW OF SYSTEMS: ROS   Positive for: Skin, Musculoskeletal, Endocrine, Eyes Negative for: Constitutional, Gastrointestinal, Neurological, Genitourinary, HENT, Cardiovascular, Respiratory, Psychiatric, Allergic/Imm, Heme/Lymph Last edited by Celine Mans, COA on 12/05/2021  2:57 PM.       ALLERGIES Allergies  Allergen Reactions   Augmentin [Amoxicillin-Pot Clavulanate] Nausea And Vomiting   Iodine Hives   Tape Rash    Paper    PAST MEDICAL HISTORY Past Medical History:  Diagnosis Date   Arthritis    Bronchitis    Bronchitis    Cataract  OU   Concussion    late 1990's  after a fall   GERD (gastroesophageal reflux disease)    History of kidney stones    Hypertension    Hypertensive retinopathy    OU   Osteogenesis imperfecta    PONV (postoperative nausea and vomiting)    pt has post polio syndrome   Post-polio syndrome    Type 2 diabetes mellitus (HCC) 12/25/2018   Past Surgical History:  Procedure Laterality Date   ANKLE FRACTURE SURGERY Bilateral    arm surgery Right    nerve surgery   COLONOSCOPY     ELBOW FRACTURE SURGERY Left    EYE MUSCLE SURGERY Left    LEG SURGERY Left    femur fracture with rod   REVERSE SHOULDER ARTHROPLASTY Right 11/10/2019   Procedure: RIGHT REVERSE SHOULDER  ARTHROPLASTY;  Surgeon: Cammy Copa, MD;  Location: [facility] OR;  Service: Orthopedics;  Laterality: Right;   TONSILLECTOMY     FAMILY HISTORY Family History  Problem Relation Age of Onset   Hypertension Mother    Diabetes Brother    SOCIAL HISTORY Social History   Tobacco Use   Smoking status: Never   Smokeless tobacco: Never  Vaping Use   Vaping Use: Never used  Substance Use Topics   Alcohol use: Yes    Comment: 1 beer occasionally   Drug use: No       OPHTHALMIC EXAM: Base Eye Exam     Visual Acuity (Snellen - Linear)       Right Left   Dist cc 20/30 -2 20/20   Dist ph cc 20/20 -2     Correction: Glasses         Tonometry (Tonopen, 3:04 PM)       Right Left   Pressure 10 12         Pupils       Dark Light Shape React APD   Right 3 2 Round Brisk None   Left 3 2 Round Brisk None         Visual Fields (Counting fingers)       Left Right    Full Full         Extraocular Movement       Right Left    Full Full         Neuro/Psych     Oriented x3: Yes   Mood/Affect: Normal         Dilation     Both eyes: 1.0% Mydriacyl, 2.5% Phenylephrine @ 3:04 PM           Slit Lamp and Fundus Exam     Slit Lamp Exam       Right Left   Lids/Lashes Dermatochalasis - upper lid, mild Meibomian gland dysfunction Dermatochalasis - upper lid, Dermatochalasis - lower lid, mild Meibomian gland dysfunction   Conjunctiva/Sclera blue sclera blue sclera   Cornea arcus, 1+ PEE, mild tear film debris arcus, SCH   Anterior Chamber deep and clear deep and clear   Iris round and dilated, no NVI round and dilated, no NVI   Lens 2-3+ NS w/brunescence, 2-3+CS 2-3+ NS w/brunesecence, 2-3+CS   Anterior Vitreous mild syneresis, PVD mild syneresis         Fundus Exam       Right Left   Disc pink and sharp pink and sharp   C/D Ratio 0.5 0.4   Macula Good foveal reflex; trace persistent edema SN macula, mild focal laser changes flat,  good foveal  reflex, mild RPE mottling and clumping, +drusen, No heme or edema   Vessels attenuated, Tortuous mild attenuation and tortuosity, mild AV crossing changes, mild copper wiring   Periphery attached, no heme attached, no heme           IMAGING AND PROCEDURES  Imaging and Procedures for  OCT, Retina - OU - Both Eyes       Right Eye Quality was good. Central Foveal Thickness: 237. Progression has improved. Findings include no SRF, intraretinal fluid, retinal drusen , normal foveal contour (Mild interval improvement in IRF/edema superior macula).   Left Eye Quality was good. Central Foveal Thickness: 257. Progression has been stable. Findings include normal foveal contour, no IRF, no SRF, vitreomacular adhesion , retinal drusen (Focal PED/drusen).   Notes *Images captured and stored on drive  Diagnosis / Impression:  OD: BRVO with Mild interval improvement in IRF/edema superior macula OS: NFP, no SRF/IRF, drusen, VMA  Clinical management:  See below  Abbreviations: NFP - Normal foveal profile. CME - cystoid macular edema. PED - pigment epithelial detachment. IRF - intraretinal fluid. SRF - subretinal fluid. EZ - ellipsoid zone. ERM - epiretinal membrane. ORA - outer retinal atrophy. ORT - outer retinal tubulation. SRHM - subretinal hyper-reflective material       Intravitreal Injection, Pharmacologic Agent - OD - Right Eye       Time Out 12/05/2021. 3:26 PM. Confirmed correct patient, procedure, site, and patient consented.   Anesthesia Topical anesthesia was used. Anesthetic medications included Lidocaine 2%, Proparacaine 0.5%.   Procedure Preparation included 5% betadine to ocular surface, eyelid speculum. A (32g) needle was used.   Injection: 2 mg aflibercept 2 MG/0.05ML   Route: Intravitreal, Site: Right Eye   NDC: L6038910, Lot: [id number], Expiration date: 10/17/2022, Waste: 0.05 mL   Post-op Post injection exam found visual acuity of at least counting  fingers. The patient tolerated the procedure well. There were no complications. The patient received written and verbal post procedure care education. Post injection medications were not given.             ASSESSMENT/PLAN:   ICD-10-CM   1. 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 Essential hypertension  I10     4. Hypertensive retinopathy of both eyes  H35.033     5. Combined forms of age-related cataract of both eyes  H25.813       1. BRVO with CME OD  - s/p IVA OD #1 (01.19.21), #2 (02.16.21), #3 (03.16.21), #4 (4.13.21) -- IVA resistance             - s/p IVE OD #1 (05.11.21--sample), #2 (06.08.21), #3 (07.06.21), #4 (8.3.21), #5 (08.31.21), #6 (09.28.21), #7 (10.26.21), #8 (11.23.21), #9 (12.22.21), #10 (1.24.22), #11 (2.22.22), #12 (3.22.22), #13 (04.19.22), #14 (05.25.22), #15 (06.29.22), #16 (07.27.22), #17 (08.24.22), #18 (09.21.22), #19 (10.19.22), #20 (11.22.22)  - s/p focal laser OD (07.13.22)  - BCVA 20/20 OD  - exam with focal edema, IRH, CWS superonasal macula -- improving  - OCT shows Mild interval improvement in IRF/edema superior macula at 4 wks  - recommend IVE OD #20 today (12.20.22) w/ f/u in 4 wks  - RBA of procedure discussed, questions answered  -  informed consent obtained  - Avastin informed consent form signed and scanned on 01.19.21  - Eylea informed consent form signed and scanned on 05.11.21  - see procedure note  - Eylea4U benefits investigation started, 05.11.21 -- approved through [employer] through 12.31.22  - F/U 4 wks -- DFE/OCT/possible injection, ?begin treat and extend next visit  2. Diabetes mellitus, type 2 without retinopathy OU  - The incidence, risk factors for progression, natural history and treatment options  for diabetic retinopathy  were discussed with patient.    - The need for close monitoring of blood glucose, blood pressure, and serum lipids, avoiding cigarette or any type of tobacco, and the need for long term follow up was also discussed with patient.  - f/u in 1 year, sooner prn  3,4. Hypertensive retinopathy OU  - discussed importance of tight BP control  - monitor  5. Age related cataracts OU   - The symptoms of cataract, surgical options, and treatments and risks were discussed with patient.  - discussed diagnosis and progression  - not yet visually significant  - monitor for now  Ophthalmic Meds Ordered this visit:  Meds ordered this encounter  Medications   aflibercept (EYLEA) SOLN 2 mg     Return in about 4 weeks (around 01/02/2022) for f/u BRVO OD, DFE, OCT.  There are no Patient Instructions on file for this visit.  This document serves as a record of services personally performed by Karie Chimera, MD, PhD. It was created on their behalf by Cristopher Estimable, COT an ophthalmic technician. The creation of this record is the provider's dictation and/or activities during the visit.    Electronically signed by: Cristopher Estimable, COT 12.20.22 @ 4:25 PM   This document serves as a record of services personally performed by Karie Chimera, MD, PhD. It was created on their behalf by Glee Arvin. Manson Passey, OA an ophthalmic technician. The creation of this record is the provider's dictation and/or activities during the visit.    Electronically signed by: Glee Arvin. Manson Passey, [place] 12.20.2022 4:25 PM   Karie Chimera, M.D., Ph.D. Diseases & Surgery of the Retina and Vitreous [facility] 12.20.22  I have reviewed the above documentation for accuracy and completeness, and I agree with the above. Karie Chimera, M.D., Ph.D. 12/05/21 4:26 PM   Abbreviations: M myopia (nearsighted); A astigmatism; H hyperopia (farsighted); P presbyopia; Mrx spectacle prescription;  CTL  contact lenses; OD right eye; OS left eye; OU both eyes  XT exotropia; ET esotropia; PEK punctate epithelial keratitis; PEE punctate epithelial erosions; DES dry eye syndrome; MGD meibomian gland dysfunction; ATs artificial tears; PFAT's preservative free artificial tears; NSC nuclear sclerotic cataract; PSC posterior subcapsular cataract; ERM epi-retinal membrane; PVD posterior vitreous detachment; RD retinal detachment; DM diabetes mellitus; DR diabetic retinopathy; NPDR non-proliferative diabetic retinopathy; PDR proliferative diabetic retinopathy; CSME clinically significant macular edema; DME diabetic macular edema; dbh dot blot hemorrhages; CWS cotton wool spot; POAG primary open angle glaucoma; C/D cup-to-disc ratio; HVF humphrey visual field; GVF goldmann visual field; OCT optical coherence tomography; IOP intraocular pressure; BRVO Branch retinal vein occlusion; CRVO central retinal vein occlusion; CRAO central retinal artery occlusion; BRAO branch retinal artery occlusion; RT retinal tear; SB scleral buckle; PPV pars plana vitrectomy; VH Vitreous hemorrhage; PRP panretinal laser photocoagulation; IVK intravitreal kenalog; VMT vitreomacular traction; MH Macular hole;  NVD neovascularization of the disc; NVE neovascularization elsewhere; AREDS age related eye disease study; ARMD age related macular degeneration; POAG primary  open angle glaucoma; EBMD epithelial/anterior basement membrane dystrophy; ACIOL anterior chamber intraocular lens; IOL intraocular lens; PCIOL posterior chamber intraocular lens; Phaco/IOL phacoemulsification with intraocular lens placement; PRK photorefractive keratectomy; LASIK laser assisted in situ keratomileusis; HTN hypertension; DM diabetes mellitus; COPD chronic obstructive pulmonary disease

## 2021-12-06 ENCOUNTER — Encounter (INDEPENDENT_AMBULATORY_CARE_PROVIDER_SITE_OTHER): Payer: Medicare Other | Admitting: Ophthalmology

## 2022-01-01 NOTE — Progress Notes (Signed)
[facility] Note  01/03/2022     CHIEF COMPLAINT Patient presents for Retina Follow Up  HISTORY OF PRESENT ILLNESS: Kurt [name] is a 71 y.o. male who presents to the clinic today for:  HPI     Retina Follow Up   Patient presents with  CRVO/BRVO.  In right eye.  This started years ago.  Severity is moderate.  Duration of 4 weeks.  Since onset it is stable.  I, the attending physician,  performed the HPI with the patient and updated documentation appropriately.        Comments   71 y/o male pt here for 4 wk f/u for BRVO w/CME OD.  No change in [place] OU.  Denies pain, FOL, floaters.  No gtts.  BS 160 2 days ago.  A1C 6.1 in December 2022.      Last edited by Bernarda Caffey, MD on 01/03/2022  4:33 PM.    Pt notes no change in [place].  Eyes have been a little dry/irritated.  Referring physician:  Gevena Cotton, MD [address] [place],  [place] [address]  HISTORICAL INFORMATION:   Selected notes from the MEDICAL RECORD NUMBER Diabetic Eval per Dr. Frederico Hamman   CURRENT MEDICATIONS: No current outpatient medications on file. (Ophthalmic Drugs)   No current facility-administered medications for this visit. (Ophthalmic Drugs)   Current Outpatient Medications (Other)  Medication Sig   acetaminophen (TYLENOL) 650 MG CR tablet Take 1,300 mg by mouth every 8 (eight) hours as needed for pain.   aspirin 81 MG chewable tablet Chew 1 tablet (81 mg total) by mouth daily.   B-D ULTRAFINE III SHORT PEN 31G X 8 MM MISC Inject into the skin.   cephALEXin (KEFLEX) 500 MG capsule 1 capsule   clindamycin (CLEOCIN) 300 MG capsule    ELIQUIS 2.5 MG TABS tablet Take 2.5 mg by mouth 2 (two) times daily.   glipiZIDE (GLUCOTROL XL) 10 MG 24 hr tablet Take 10 mg by mouth at bedtime.    HYDROcodone-acetaminophen (NORCO) 7.5-325 MG tablet 1 tablet as needed   ketoconazole (NIZORAL) 2 % cream 1 application to feet and in between toes   LANTUS SOLOSTAR 100  UNIT/ML Solostar Pen Inject 34 Units into the skin at bedtime.   lisinopril (PRINIVIL,ZESTRIL) 20 MG tablet Take 20 mg by mouth daily.   lisinopril (ZESTRIL) 20 MG tablet    methocarbamol (ROBAXIN) 500 MG tablet Take 500 mg by mouth at bedtime as needed for muscle spasms.   ONETOUCH ULTRA test strip 1 each 2 (two) times daily.   oxyCODONE (OXY IR/ROXICODONE) 5 MG immediate release tablet Take 1 tablet (5 mg total) by mouth every 8 (eight) hours as needed for moderate pain (pain score 4-6).   simvastatin (ZOCOR) 20 MG tablet    SitaGLIPtin-MetFORMIN HCl [phone] MG TB24 Take 1 tablet by mouth at bedtime.   ketoconazole (NIZORAL) 2 % cream  (Patient not taking: Reported on 01/03/2022)   simvastatin (ZOCOR) 20 MG tablet Take 20 mg by mouth every evening. (Patient not taking: Reported on 01/03/2022)   No current facility-administered medications for this visit. (Other)   REVIEW OF SYSTEMS: ROS   Positive for: Musculoskeletal, Endocrine, Eyes Negative for: Constitutional, Gastrointestinal, Neurological, Skin, Genitourinary, HENT, Cardiovascular, Respiratory, Psychiatric, Allergic/Imm, Heme/Lymph Last edited by Matthew Folks, COA on 01/03/2022  2:42 PM.     ALLERGIES Allergies  Allergen Reactions   Augmentin [Amoxicillin-Pot Clavulanate] Nausea And Vomiting   Iodine  Hives   Tape Rash    Paper    PAST MEDICAL HISTORY Past Medical History:  Diagnosis Date   Arthritis    Bronchitis    Bronchitis    Cataract    OU   Concussion    late 1990's  after a fall   GERD (gastroesophageal reflux disease)    History of kidney stones    Hypertension    Hypertensive retinopathy    OU   Osteogenesis imperfecta    PONV (postoperative nausea and vomiting)    pt has post polio syndrome   Post-polio syndrome    Type 2 diabetes mellitus ([place]) 12/25/2018   Past Surgical History:  Procedure Laterality Date   ANKLE FRACTURE SURGERY Bilateral    arm surgery Right    nerve surgery   COLONOSCOPY      ELBOW FRACTURE SURGERY Left    EYE MUSCLE SURGERY Left    LEG SURGERY Left    femur fracture with rod   REVERSE SHOULDER ARTHROPLASTY Right 11/10/2019   Procedure: RIGHT REVERSE SHOULDER ARTHROPLASTY;  Surgeon: Meredith Pel, MD;  Location: [place];  Service: Orthopedics;  Laterality: Right;   TONSILLECTOMY     FAMILY HISTORY Family History  Problem Relation Age of Onset   Hypertension Mother    Diabetes Brother    SOCIAL HISTORY Social History   Tobacco Use   Smoking status: Never   Smokeless tobacco: Never  Vaping Use   Vaping Use: Never used  Substance Use Topics   Alcohol use: Yes    Comment: 1 beer occasionally   Drug use: No       OPHTHALMIC EXAM: Base Eye Exam     Visual Acuity (Snellen - Linear)       Right Left   Dist cc 20/30 - 20/20 -2   Dist ph cc 20/20 -2     Correction: Glasses         Tonometry (Tonopen, 2:47 PM)       Right Left   Pressure 11 11         Pupils       Dark Light Shape React APD   Right 3 2 Round Brisk None   Left 3 2 Round Brisk None         Visual Fields (Counting fingers)       Left Right    Full Full         Extraocular Movement       Right Left    Full, Ortho Full, Ortho         Neuro/Psych     Oriented x3: Yes   Mood/Affect: Normal         Dilation     Both eyes: 1.0% Mydriacyl, 2.5% Phenylephrine @ 2:47 PM           Slit Lamp and Fundus Exam     Slit Lamp Exam       Right Left   Lids/Lashes Dermatochalasis - upper lid, mild Meibomian gland dysfunction Dermatochalasis - upper lid, Dermatochalasis - lower lid, mild Meibomian gland dysfunction   Conjunctiva/Sclera blue sclera blue sclera   Cornea arcus, 1+ PEE, mild tear film debris arcus, [place]   Anterior Chamber deep and clear deep and clear   Iris round and dilated, no NVI round and dilated, no NVI   Lens 2-3+ NS w/brunescence, 2-3+CS 2-3+ NS w/brunesecence, 2-3+CS   Anterior Vitreous mild syneresis, PVD mild syneresis  Fundus Exam       Right Left   Disc pink and sharp pink and sharp   C/D Ratio 0.5 0.4   Macula Good foveal reflex; trace persistent edema SN macula, mild focal laser changes, no heme flat, good foveal reflex, mild RPE mottling and clumping, +drusen, No heme or edema   Vessels attenuated, mildly tortuous mild attenuation and tortuosity, mild AV crossing changes, mild copper wiring   Periphery attached, no heme attached, no heme           IMAGING AND PROCEDURES  Imaging and Procedures for  OCT, Retina - OU - Both Eyes       Right Eye Quality was good. Central Foveal Thickness: 234. Progression has improved. Findings include no SRF, intraretinal fluid, retinal drusen , normal foveal contour (Mild interval improvement in IRF/edema superior macula).   Left Eye Quality was good. Central Foveal Thickness: 257. Progression has been stable. Findings include normal foveal contour, no IRF, no SRF, vitreomacular adhesion , retinal drusen (Focal PED/drusen).   Notes *Images captured and stored on drive  Diagnosis / Impression:  OD: BRVO with Mild interval improvement in IRF/edema superior macula OS: NFP, no SRF/IRF, drusen, VMA  Clinical management:  See below  Abbreviations: NFP - Normal foveal profile. CME - cystoid macular edema. PED - pigment epithelial detachment. IRF - intraretinal fluid. SRF - subretinal fluid. EZ - ellipsoid zone. ERM - epiretinal membrane. ORA - outer retinal atrophy. ORT - outer retinal tubulation. SRHM - subretinal hyper-reflective material       Intravitreal Injection, Pharmacologic Agent - OD - Right Eye       Time Out 01/03/2022. 3:09 PM. Confirmed correct patient, procedure, site, and patient consented.   Anesthesia Topical anesthesia was used. Anesthetic medications included Lidocaine 2%, Proparacaine 0.5%.   Procedure Preparation included 5% betadine to ocular surface, eyelid speculum. A (32g) needle was used.   Injection: 2 mg  aflibercept 2 MG/0.05ML   Route: Intravitreal, Site: Right Eye   NDC: [id number], Lot: [id number], Expiration date: 11/15/2022, Waste: 0.05 mL   Post-op Post injection exam found visual acuity of at least counting fingers. The patient tolerated the procedure well. There were no complications. The patient received written and verbal post procedure care education. Post injection medications were not given.            ASSESSMENT/PLAN:   ICD-10-CM   1. 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 Diabetes mellitus type 2 without retinopathy ([place])  E11.9     3. Essential hypertension  I10     4. Hypertensive retinopathy of both eyes  H35.033     5. Combined forms of age-related cataract of both eyes  H25.813      1. BRVO with CME OD  - s/p IVA OD #1 (01.19.21), #2 (02.16.21), #3 (03.16.21), #4 (4.13.21) -- IVA resistance             - s/p IVE OD #1 (05.11.21--sample), #2 (06.08.21), #3 (07.06.21), #4 (8.3.21), #5 (08.31.21), #6 (09.28.21), #7 (10.26.21), #8 (11.23.21), #9 (12.22.21), #10 (1.24.22), #11 (2.22.22), #12 (3.22.22), #13 (04.19.22), #14 (05.25.22), #15 (06.29.22), #16 (07.27.22), #17 (08.24.22), #18 (09.21.22), #19 (10.19.22), #20 (11.22.22), #21 (12.20.22)  - s/p focal laser OD (07.13.22)  - BCVA 20/20 OD  - exam with focal  edema, IRH, CWS superonasal macula -- improving  - OCT shows Mild interval improvement in IRF/edema superior macula at 4 wks  - recommend IVE OD #22 today (01.18.23) w/ ext f/u to 5 wks  - RBA of procedure discussed, questions answered  - informed consent obtained  - Avastin informed consent form signed and scanned on 01.19.21  - Eylea informed consent form signed and scanned on 05.11.21  - see procedure note  - Eylea4U benefits investigation started, 05.11.21 -- approved through [employer]  through 12.31.22  - F/U 5 wks -- DFE/OCT/likely injection, tx & extend as able  2. Diabetes mellitus, type 2 without retinopathy OU  - The incidence, risk factors for progression, natural history and treatment options for diabetic retinopathy  were discussed with patient.    - The need for close monitoring of blood glucose, blood pressure, and serum lipids, avoiding cigarette or any type of tobacco, and the need for long term follow up was also discussed with patient.  - f/u in 1 year, sooner prn  3,4. Hypertensive retinopathy OU  - discussed importance of tight BP control  - monitor  5. Age related cataracts OU   - The symptoms of cataract, surgical options, and treatments and risks were discussed with patient.  - discussed diagnosis and progression  - not yet visually significant  - monitor for now  Ophthalmic Meds Ordered this visit:  Meds ordered this encounter  Medications   aflibercept (EYLEA) SOLN 2 mg     Return in about 5 weeks (around 02/07/2022) for 5 wk f/u for BRVO w/CME OD w/DFE/OCT/likely IVE OD.  There are no Patient Instructions on file for this visit.  This document serves as a record of services personally performed by Gardiner Sleeper, MD, PhD. It was created on their behalf by Orvan Falconer, an ophthalmic technician. The creation of this record is the provider's dictation and/or activities during the visit.    Electronically signed by: Orvan Falconer, OA, 01/03/22  4:36 PM  This document serves as a record of services personally performed by Gardiner Sleeper, MD, PhD. It was created on their behalf by Estill Bakes, COT an ophthalmic technician. The creation of this record is the provider's dictation and/or activities during the visit.    Electronically signed by: Estill Bakes, COT 1.18.23 @ 4:36 PM   Gardiner Sleeper, M.D., Ph.D. Diseases & Surgery of the Retina and [place] 1.18.23  I have reviewed the above  documentation for accuracy and completeness, and I agree with the above. Gardiner Sleeper, M.D., Ph.D. 01/03/22 4:36 PM   Abbreviations: M myopia (nearsighted); A astigmatism; H hyperopia (farsighted); P presbyopia; Mrx spectacle prescription;  CTL contact lenses; OD right eye; OS left eye; OU both eyes  XT exotropia; ET esotropia; PEK punctate epithelial keratitis; PEE punctate epithelial erosions; DES dry eye syndrome; MGD meibomian gland dysfunction; ATs artificial tears; PFAT's preservative free artificial tears; [place] nuclear sclerotic cataract; PSC posterior subcapsular cataract; ERM epi-retinal membrane; PVD posterior vitreous detachment; RD retinal detachment; DM diabetes mellitus; DR diabetic retinopathy; NPDR non-proliferative diabetic retinopathy; PDR proliferative diabetic retinopathy; CSME clinically significant macular edema; DME diabetic macular edema; dbh dot blot hemorrhages; CWS cotton wool spot; POAG primary open angle glaucoma; C/D cup-to-disc ratio; HVF humphrey visual field; GVF goldmann visual field; OCT optical coherence tomography; IOP intraocular pressure; BRVO Branch retinal vein occlusion; CRVO central retinal vein occlusion; CRAO central retinal artery occlusion; BRAO branch retinal artery occlusion; RT retinal  tear; SB scleral buckle; PPV pars plana vitrectomy; VH Vitreous hemorrhage; PRP panretinal laser photocoagulation; IVK intravitreal kenalog; VMT vitreomacular traction; MH Macular hole;  NVD neovascularization of the disc; NVE neovascularization elsewhere; AREDS age related eye disease study; ARMD age related macular degeneration; POAG primary open angle glaucoma; EBMD epithelial/anterior basement membrane dystrophy; ACIOL anterior chamber intraocular lens; IOL intraocular lens; PCIOL posterior chamber intraocular lens; Phaco/IOL phacoemulsification with intraocular lens placement; [place] photorefractive keratectomy; LASIK laser assisted in situ keratomileusis; HTN hypertension; DM  diabetes mellitus; COPD chronic obstructive pulmonary disease

## 2022-01-03 ENCOUNTER — Ambulatory Visit (INDEPENDENT_AMBULATORY_CARE_PROVIDER_SITE_OTHER): Payer: Medicare Other | Admitting: Ophthalmology

## 2022-01-03 ENCOUNTER — Other Ambulatory Visit: Payer: Self-pay

## 2022-01-03 ENCOUNTER — Encounter (INDEPENDENT_AMBULATORY_CARE_PROVIDER_SITE_OTHER): Payer: Self-pay | Admitting: Ophthalmology

## 2022-01-03 DIAGNOSIS — H35033 Hypertensive retinopathy, bilateral: Secondary | ICD-10-CM | POA: Diagnosis not present

## 2022-01-03 DIAGNOSIS — E119 Type 2 diabetes mellitus without complications: Secondary | ICD-10-CM

## 2022-01-03 DIAGNOSIS — H34831 Tributary (branch) retinal vein occlusion, right eye, with macular edema: Secondary | ICD-10-CM | POA: Diagnosis not present

## 2022-01-03 DIAGNOSIS — I10 Essential (primary) hypertension: Secondary | ICD-10-CM | POA: Diagnosis not present

## 2022-01-03 DIAGNOSIS — H25813 Combined forms of age-related cataract, bilateral: Secondary | ICD-10-CM | POA: Diagnosis not present

## 2022-01-03 MED ORDER — AFLIBERCEPT 2MG/0.05ML IZ SOLN FOR KALEIDOSCOPE
2.0000 mg | INTRAVITREAL | Status: AC | PRN
  Administered 2022-01-03: 2 mg via INTRAVITREAL

## 2022-02-01 NOTE — Progress Notes (Signed)
[facility] Note  02/07/2022     CHIEF COMPLAINT Patient presents for Retina Follow Up  HISTORY OF PRESENT ILLNESS: Kurt [name] is a 71 y.o. male who presents to the clinic today for:  HPI     Retina Follow Up   Patient presents with  CRVO/BRVO.  In right eye.  Severity is moderate.  Duration of 5 weeks.  Since onset it is stable.  I, the attending physician,  performed the HPI with the patient and updated documentation appropriately.        Comments   Pt here for ret f/u BRVO OD. Pt states VA is the same. Pt was given new rx to fill but has not gotten new specs yet. Pt having trouble w/ watering OU.       Last edited by Bernarda Caffey, MD on 02/08/2022 11:50 PM.     Patient states vision the same OU.  Referring physician:  [facility] [place],  [place] [address]  HISTORICAL INFORMATION:   Selected notes from the MEDICAL RECORD NUMBER Diabetic Eval per Dr. Frederico Hamman   CURRENT MEDICATIONS: No current outpatient medications on file. (Ophthalmic Drugs)   No current facility-administered medications for this visit. (Ophthalmic Drugs)   Current Outpatient Medications (Other)  Medication Sig   acetaminophen (TYLENOL) 650 MG CR tablet Take 1,300 mg by mouth every 8 (eight) hours as needed for pain.   aspirin 81 MG chewable tablet Chew 1 tablet (81 mg total) by mouth daily.   B-D ULTRAFINE III SHORT PEN 31G X 8 MM MISC Inject into the skin.   cephALEXin (KEFLEX) 500 MG capsule 1 capsule   clindamycin (CLEOCIN) 300 MG capsule    ELIQUIS 2.5 MG TABS tablet Take 2.5 mg by mouth 2 (two) times daily.   glipiZIDE (GLUCOTROL XL) 10 MG 24 hr tablet Take 10 mg by mouth at bedtime.    HYDROcodone-acetaminophen (NORCO) 7.5-325 MG tablet 1 tablet as needed   ketoconazole (NIZORAL) 2 % cream 1 application to feet and in between toes   ketoconazole (NIZORAL) 2 % cream    LANTUS SOLOSTAR 100 UNIT/ML Solostar Pen Inject 34  Units into the skin at bedtime.   lisinopril (PRINIVIL,ZESTRIL) 20 MG tablet Take 20 mg by mouth daily.   lisinopril (ZESTRIL) 20 MG tablet    methocarbamol (ROBAXIN) 500 MG tablet Take 500 mg by mouth at bedtime as needed for muscle spasms.   ONETOUCH ULTRA test strip 1 each 2 (two) times daily.   oxyCODONE (OXY IR/ROXICODONE) 5 MG immediate release tablet Take 1 tablet (5 mg total) by mouth every 8 (eight) hours as needed for moderate pain (pain score 4-6).   simvastatin (ZOCOR) 20 MG tablet Take 20 mg by mouth every evening.   simvastatin (ZOCOR) 20 MG tablet    SitaGLIPtin-MetFORMIN HCl [phone] MG TB24 Take 1 tablet by mouth at bedtime.   No current facility-administered medications for this visit. (Other)   REVIEW OF SYSTEMS: ROS   Positive for: Musculoskeletal, Endocrine, Eyes Negative for: Constitutional, Gastrointestinal, Neurological, Skin, Genitourinary, HENT, Cardiovascular, Respiratory, Psychiatric, Allergic/Imm, Heme/Lymph Last edited by Kingsley Spittle, COT on 02/07/2022  2:32 PM.      ALLERGIES Allergies  Allergen Reactions   Augmentin [Amoxicillin-Pot Clavulanate] Nausea And Vomiting   Iodine Hives   Tape Rash    Paper    PAST MEDICAL HISTORY Past Medical History:  Diagnosis Date   Arthritis    Bronchitis  Bronchitis    Cataract    OU   Concussion    late 1990's  after a fall   GERD (gastroesophageal reflux disease)    History of kidney stones    Hypertension    Hypertensive retinopathy    OU   Osteogenesis imperfecta    PONV (postoperative nausea and vomiting)    pt has post polio syndrome   Post-polio syndrome    Type 2 diabetes mellitus ([place]) 12/25/2018   Past Surgical History:  Procedure Laterality Date   ANKLE FRACTURE SURGERY Bilateral    arm surgery Right    nerve surgery   COLONOSCOPY     ELBOW FRACTURE SURGERY Left    EYE MUSCLE SURGERY Left    LEG SURGERY Left    femur fracture with rod   REVERSE SHOULDER ARTHROPLASTY Right  11/10/2019   Procedure: RIGHT REVERSE SHOULDER ARTHROPLASTY;  Surgeon: Meredith Pel, MD;  Location: [facility];  Service: Orthopedics;  Laterality: Right;   TONSILLECTOMY     FAMILY HISTORY Family History  Problem Relation Age of Onset   Hypertension Mother    Diabetes Brother    SOCIAL HISTORY Social History   Tobacco Use   Smoking status: Never   Smokeless tobacco: Never  Vaping Use   Vaping Use: Never used  Substance Use Topics   Alcohol use: Yes    Comment: 1 beer occasionally   Drug use: No       OPHTHALMIC EXAM: Base Eye Exam     Visual Acuity (Snellen - Linear)       Right Left   Dist cc 20/30 -2 20/20 -1   Dist ph cc NI     Correction: Glasses         Tonometry (Tonopen, 2:43 PM)       Right Left   Pressure 14 17         Pupils       Dark Light Shape React APD   Right 3 2 Round Brisk None   Left 3 2 Round Brisk None         Visual Fields (Counting fingers)       Left Right    Full Full         Extraocular Movement       Right Left    Full, Ortho Full, Ortho         Neuro/Psych     Oriented x3: Yes   Mood/Affect: Normal         Dilation     Both eyes: 1.0% Mydriacyl, 2.5% Phenylephrine @ 2:44 PM           Slit Lamp and Fundus Exam     Slit Lamp Exam       Right Left   Lids/Lashes Dermatochalasis - upper lid, mild Meibomian gland dysfunction Dermatochalasis - upper lid, Dermatochalasis - lower lid, mild Meibomian gland dysfunction   Conjunctiva/Sclera blue sclera blue sclera   Cornea arcus, 1+ PEE, mild tear film debris arcus, [place]   Anterior Chamber deep and clear deep and clear   Iris round and dilated, no NVI round and dilated, no NVI   Lens 2-3+ NS w/brunescence, 2-3+CS 2-3+ NS w/brunesecence, 2-3+CS   Anterior Vitreous mild syneresis, PVD mild syneresis         Fundus Exam       Right Left   Disc pink and sharp pink and sharp   C/D Ratio 0.5 0.4   Macula Good foveal reflex;  trace persistent edema  SN macula, mild focal laser changes, no heme flat, good foveal reflex, mild RPE mottling and clumping, +drusen, No heme or edema   Vessels attenuated, mildly tortuous mild attenuation and tortuosity, mild AV crossing changes, mild copper wiring   Periphery attached, no heme attached, no heme           Refraction     Wearing Rx       Sphere Cylinder Axis Add   Right -2.00 +2.25 111 +2.75   Left -1.50 +1.50 094 +2.75         Manifest Refraction       Sphere Cylinder Axis Dist VA   Right -2.00 +1.75 090 20/30   Left               IMAGING AND PROCEDURES  Imaging and Procedures for  OCT, Retina - OU - Both Eyes       Right Eye Quality was good. Central Foveal Thickness: 234. Progression has been stable. Findings include no SRF, intraretinal fluid, retinal drusen , normal foveal contour (Persistent IRF/edema superior macula---minimal improvement).   Left Eye Quality was good. Central Foveal Thickness: 259. Progression has been stable. Findings include normal foveal contour, no IRF, no SRF, vitreomacular adhesion , retinal drusen (Focal PED/drusen).   Notes *Images captured and stored on drive  Diagnosis / Impression:  OD: BFVO with persistent IRF/edema superior macula OS: NFP, no SRF/IRF, drusen, VMA  Clinical management:  See below  Abbreviations: NFP - Normal foveal profile. CME - cystoid macular edema. PED - pigment epithelial detachment. IRF - intraretinal fluid. SRF - subretinal fluid. EZ - ellipsoid zone. ERM - epiretinal membrane. ORA - outer retinal atrophy. ORT - outer retinal tubulation. SRHM - subretinal hyper-reflective material       Intravitreal Injection, Pharmacologic Agent - OD - Right Eye       Time Out 02/07/2022. 3:32 PM. Confirmed correct patient, procedure, site, and patient consented.   Anesthesia Topical anesthesia was used. Anesthetic medications included Lidocaine 2%, Proparacaine 0.5%.   Procedure Preparation included 5%  betadine to ocular surface, eyelid speculum. A (32 g) needle was used.   Injection: 2 mg aflibercept 2 MG/0.05ML   Route: Intravitreal, Site: Right Eye   NDC: [id number], Lot: [id number], Expiration date: 12/16/2022, Waste: 0.05 mL   Post-op Post injection exam found visual acuity of at least counting fingers. The patient tolerated the procedure well. There were no complications. The patient received written and verbal post procedure care education. Post injection medications were not given.            ASSESSMENT/PLAN:   ICD-10-CM   1. 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 Diabetes mellitus type 2 without retinopathy ([place])  E11.9     3. Essential hypertension  I10     4. Hypertensive retinopathy of both eyes  H35.033     5. Combined forms of age-related cataract of both eyes  H25.813      1. BRVO with CME OD  - s/p IVA OD #1 (01.19.21), #2 (02.16.21), #3 (03.16.21), #4 (4.13.21) -- IVA resistance             - s/p IVE OD #1 (05.11.21--sample), #2 (06.08.21), #3 (07.06.21), #4 (8.3.21), #5 (08.31.21), #6 (09.28.21), #7 (10.26.21), #8 (11.23.21), #  9 (12.22.21), #10 (1.24.22), #11 (2.22.22), #12 (3.22.22), #13 (04.19.22), #14 (05.25.22), #15 (06.29.22), #16 (07.27.22), #17 (08.24.22), #18 (09.21.22), #19 (10.19.22), #20 (11.22.22), #21 (12.20.22), #22 (01.18.23)  - s/p focal laser OD (07.13.22)  - BCVA 20/30 OD (slightly decreased from 20/20)  - exam with focal edema, IRH, CWS superonasal macula -- improving  - OCT shows persistent IRF/edema superior macula at 5 wks  - recommend IVE OD #23 today (02.22.23) w/ f/u 5 wks  - RBA of procedure discussed, questions answered  - informed consent obtained  - Avastin informed consent form signed and scanned on 01.19.21  - Eylea informed consent form signed and scanned on  02.22.23  - see procedure note  - Eylea4U benefits investigation started, 05.11.21 -- approved through [employer] through 12.31.22  - F/U 5 wks -- DFE/OCT/likely injection, tx & extend as able  2. Diabetes mellitus, type 2 without retinopathy OU  - The incidence, risk factors for progression, natural history and treatment options for diabetic retinopathy  were discussed with patient.    - The need for close monitoring of blood glucose, blood pressure, and serum lipids, avoiding cigarette or any type of tobacco, and the need for long term follow up was also discussed with patient.  - f/u in 1 year, sooner PRN  3,4. Hypertensive retinopathy OU  - discussed importance of tight BP control  - monitor  5. Age related cataracts OU   - The symptoms of cataract, surgical options, and treatments and risks were discussed with patient.  - discussed diagnosis and progression  - not yet visually significant  - monitor  Ophthalmic Meds Ordered this visit:  Meds ordered this encounter  Medications   aflibercept (EYLEA) SOLN 2 mg     Return in about 5 weeks (around 03/14/2022) for DFE, OCT.  There are no Patient Instructions on file for this visit.  This document serves as a record of services personally performed by Gardiner Sleeper, MD, PhD. It was created on their behalf by Orvan Falconer, an ophthalmic technician. The creation of this record is the provider's dictation and/or activities during the visit.    Electronically signed by: Orvan Falconer, OA, 02/08/22  11:56 PM  This document serves as a record of services personally performed by Gardiner Sleeper, MD, PhD. It was created on their behalf by Roselee Nova, COMT. The creation of this record is the provider's dictation and/or activities during the visit.  Electronically signed by: Roselee Nova, COMT 02/08/22 11:56 PM  Gardiner Sleeper, M.D., Ph.D. Diseases & Surgery of the Retina and Vitreous [facility]  I  have reviewed the above documentation for accuracy and completeness, and I agree with the above. Gardiner Sleeper, M.D., Ph.D. 02/08/22 11:58 PM   Abbreviations: M myopia (nearsighted); A astigmatism; H hyperopia (farsighted); P presbyopia; Mrx spectacle prescription;  CTL contact lenses; OD right eye; OS left eye; OU both eyes  XT exotropia; ET esotropia; PEK punctate epithelial keratitis; PEE punctate epithelial erosions; DES dry eye syndrome; MGD meibomian gland dysfunction; ATs artificial tears; PFAT's preservative free artificial tears; [place] nuclear sclerotic cataract; PSC posterior subcapsular cataract; ERM epi-retinal membrane; PVD posterior vitreous detachment; RD retinal detachment; DM diabetes mellitus; DR diabetic retinopathy; NPDR non-proliferative diabetic retinopathy; PDR proliferative diabetic retinopathy; CSME clinically significant macular edema; DME diabetic macular edema; dbh dot blot hemorrhages; CWS cotton wool spot; POAG primary open angle glaucoma; C/D cup-to-disc ratio; HVF humphrey visual field; GVF goldmann visual field; OCT optical coherence  tomography; IOP intraocular pressure; BRVO Branch retinal vein occlusion; CRVO central retinal vein occlusion; CRAO central retinal artery occlusion; BRAO branch retinal artery occlusion; RT retinal tear; SB scleral buckle; PPV pars plana vitrectomy; VH Vitreous hemorrhage; PRP panretinal laser photocoagulation; IVK intravitreal kenalog; VMT vitreomacular traction; MH Macular hole;  NVD neovascularization of the disc; NVE neovascularization elsewhere; AREDS age related eye disease study; ARMD age related macular degeneration; POAG primary open angle glaucoma; EBMD epithelial/anterior basement membrane dystrophy; ACIOL anterior chamber intraocular lens; IOL intraocular lens; PCIOL posterior chamber intraocular lens; Phaco/IOL phacoemulsification with intraocular lens placement; [place] photorefractive keratectomy; LASIK laser assisted in situ  keratomileusis; HTN hypertension; DM diabetes mellitus; COPD chronic obstructive pulmonary disease

## 2022-02-07 ENCOUNTER — Encounter (INDEPENDENT_AMBULATORY_CARE_PROVIDER_SITE_OTHER): Payer: Self-pay | Admitting: Ophthalmology

## 2022-02-07 ENCOUNTER — Other Ambulatory Visit: Payer: Self-pay

## 2022-02-07 ENCOUNTER — Ambulatory Visit (INDEPENDENT_AMBULATORY_CARE_PROVIDER_SITE_OTHER): Payer: Medicare Other | Admitting: Ophthalmology

## 2022-02-07 DIAGNOSIS — H34831 Tributary (branch) retinal vein occlusion, right eye, with macular edema: Secondary | ICD-10-CM

## 2022-02-07 DIAGNOSIS — H35033 Hypertensive retinopathy, bilateral: Secondary | ICD-10-CM

## 2022-02-07 DIAGNOSIS — I10 Essential (primary) hypertension: Secondary | ICD-10-CM

## 2022-02-07 DIAGNOSIS — H25813 Combined forms of age-related cataract, bilateral: Secondary | ICD-10-CM

## 2022-02-07 DIAGNOSIS — E119 Type 2 diabetes mellitus without complications: Secondary | ICD-10-CM

## 2022-02-08 ENCOUNTER — Encounter (INDEPENDENT_AMBULATORY_CARE_PROVIDER_SITE_OTHER): Payer: Self-pay | Admitting: Ophthalmology

## 2022-02-08 MED ORDER — AFLIBERCEPT 2MG/0.05ML IZ SOLN FOR KALEIDOSCOPE
2.0000 mg | INTRAVITREAL | Status: AC | PRN
  Administered 2022-02-08: 2 mg via INTRAVITREAL

## 2022-03-12 NOTE — Progress Notes (Signed)
?[facility] Note ? ?03/13/2022 ? ?  ? ?CHIEF COMPLAINT ?Patient presents for Retina Follow Up ? ?HISTORY OF PRESENT ILLNESS: ?Kurt [name] is a 71 y.o. male who presents to the clinic today for:  ?HPI   ? ? Retina Follow Up   ?Patient presents with  CRVO/BRVO.  In right eye.  This started 5 weeks ago.  I, the attending physician,  performed the HPI with the patient and updated documentation appropriately. ? ?  ?  ? ? Comments   ?Patient here for 5 weeks retina follow up for BRVO OD. Patient states vision is alright. Lately when reads eyes get watery and can't stay focused. No eye pain.  ? ?  ?  ?Last edited by Rennis Chris, MD on 03/14/2022  1:21 AM.  ?  ?Patient states vision the same OU. ? ?Referring physician:  ?[facility] ?[address] ?[place],  [place] [address] ? ?HISTORICAL INFORMATION:  ? ?Selected notes from the MEDICAL RECORD NUMBER ?Diabetic Eval per Dr. Karleen Hampshire  ? ?CURRENT MEDICATIONS: ?No current outpatient medications on file. (Ophthalmic Drugs)  ? ?No current facility-administered medications for this visit. (Ophthalmic Drugs)  ? ?Current Outpatient Medications (Other)  ?Medication Sig  ? acetaminophen (TYLENOL) 650 MG CR tablet Take 1,300 mg by mouth every 8 (eight) hours as needed for pain.  ? aspirin 81 MG chewable tablet Chew 1 tablet (81 mg total) by mouth daily.  ? B-D ULTRAFINE III SHORT PEN 31G X 8 MM MISC Inject into the skin.  ? cephALEXin (KEFLEX) 500 MG capsule 1 capsule  ? clindamycin (CLEOCIN) 300 MG capsule   ? ELIQUIS 2.5 MG TABS tablet Take 2.5 mg by mouth 2 (two) times daily.  ? glipiZIDE (GLUCOTROL XL) 10 MG 24 hr tablet Take 10 mg by mouth at bedtime.   ? HYDROcodone-acetaminophen (NORCO) 7.5-325 MG tablet 1 tablet as needed  ? ketoconazole (NIZORAL) 2 % cream 1 application to feet and in between toes  ? ketoconazole (NIZORAL) 2 % cream   ? LANTUS SOLOSTAR 100 UNIT/ML Solostar Pen Inject 34 Units into the skin at bedtime.  ?  lisinopril (PRINIVIL,ZESTRIL) 20 MG tablet Take 20 mg by mouth daily.  ? lisinopril (ZESTRIL) 20 MG tablet   ? methocarbamol (ROBAXIN) 500 MG tablet Take 500 mg by mouth at bedtime as needed for muscle spasms.  ? ONETOUCH ULTRA test strip 1 each 2 (two) times daily.  ? oxyCODONE (OXY IR/ROXICODONE) 5 MG immediate release tablet Take 1 tablet (5 mg total) by mouth every 8 (eight) hours as needed for moderate pain (pain score 4-6).  ? simvastatin (ZOCOR) 20 MG tablet Take 20 mg by mouth every evening.  ? simvastatin (ZOCOR) 20 MG tablet   ? SitaGLIPtin-MetFORMIN HCl [phone] MG TB24 Take 1 tablet by mouth at bedtime.  ? ?No current facility-administered medications for this visit. (Other)  ? ?REVIEW OF SYSTEMS: ?ROS   ?Positive for: Musculoskeletal, Endocrine, Eyes ?Negative for: Constitutional, Gastrointestinal, Neurological, Skin, Genitourinary, HENT, Cardiovascular, Respiratory, Psychiatric, Allergic/Imm, Heme/Lymph ?Last edited by Laddie Aquas, COA on 03/13/2022  2:36 PM.  ?  ? ?ALLERGIES ?Allergies  ?Allergen Reactions  ? Augmentin [Amoxicillin-Pot Clavulanate] Nausea And Vomiting  ? Iodine Hives  ? Tape Rash  ?  Paper   ? ?PAST MEDICAL HISTORY ?Past Medical History:  ?Diagnosis Date  ? Arthritis   ? Bronchitis   ? Bronchitis   ? Cataract   ? OU  ? Concussion   ?  late 1990's  after a fall  ? GERD (gastroesophageal reflux disease)   ? History of kidney stones   ? Hypertension   ? Hypertensive retinopathy   ? OU  ? Osteogenesis imperfecta   ? PONV (postoperative nausea and vomiting)   ? pt has post polio syndrome  ? Post-polio syndrome   ? Type 2 diabetes mellitus ([facility]) 12/25/2018  ? ?Past Surgical History:  ?Procedure Laterality Date  ? ANKLE FRACTURE SURGERY Bilateral   ? arm surgery Right   ? nerve surgery  ? COLONOSCOPY    ? ELBOW FRACTURE SURGERY Left   ? EYE MUSCLE SURGERY Left   ? LEG SURGERY Left   ? femur fracture with rod  ? REVERSE SHOULDER ARTHROPLASTY Right 11/10/2019  ? Procedure: RIGHT REVERSE  SHOULDER ARTHROPLASTY;  Surgeon: Cammy Copa, MD;  [facility];  Service: Orthopedics;  Laterality: Right;  ? TONSILLECTOMY    ? ?FAMILY HISTORY ?Family History  ?Problem Relation Age of Onset  ? Hypertension Mother   ? Diabetes Brother   ? ?SOCIAL HISTORY ?Social History  ? ?Tobacco Use  ? Smoking status: Never  ? Smokeless tobacco: Never  ?Vaping Use  ? Vaping Use: Never used  ?Substance Use Topics  ? Alcohol use: Yes  ?  Comment: 1 beer occasionally  ? Drug use: No  ?  ? ?  ?OPHTHALMIC EXAM: ?Base Eye Exam   ? ? Visual Acuity (Snellen - Linear)   ? ?   Right Left  ? Dist cc 20/30 -2 20/20 -1  ? Dist ph cc 20/25 -1   ? ? Correction: Glasses  ? ?  ?  ? ? Tonometry (Tonopen, 2:33 PM)   ? ?   Right Left  ? Pressure 14 15  ? ?  ?  ? ? Pupils   ? ?   Dark Light Shape React APD  ? Right 3 2 Round Brisk None  ? Left 3 2 Round Brisk None  ? ?  ?  ? ? Visual Fields (Counting fingers)   ? ?   Left Right  ?  Full Full  ? ?  ?  ? ? Extraocular Movement   ? ?   Right Left  ?  Full, Ortho Full, Ortho  ? ?  ?  ? ? Neuro/Psych   ? ? Oriented x3: Yes  ? Mood/Affect: Normal  ? ?  ?  ? ? Dilation   ? ? Both eyes: 1.0% Mydriacyl, 2.5% Phenylephrine @ 2:32 PM  ? ?  ?  ? ?  ? ?Slit Lamp and Fundus Exam   ? ? Slit Lamp Exam   ? ?   Right Left  ? Lids/Lashes Dermatochalasis - upper lid, mild Meibomian gland dysfunction Dermatochalasis - upper lid, Dermatochalasis - lower lid, mild Meibomian gland dysfunction  ? Conjunctiva/Sclera blue sclera blue sclera  ? Cornea arcus, 1+ PEE, mild tear film debris arcus, SCH  ? Anterior Chamber deep and clear deep and clear  ? Iris round and dilated, no NVI round and dilated, no NVI  ? Lens 2-3+ NS w/brunescence, 2-3+CS 2-3+ NS w/brunesecence, 2-3+CS  ? Anterior Vitreous mild syneresis, PVD mild syneresis  ? ?  ?  ? ? Fundus Exam   ? ?   Right Left  ? Disc pink and sharp pink and sharp  ? C/D Ratio 0.5 0.4  ? Macula Good foveal reflex; trace persistent edema SN macula, mild focal laser  changes, no heme flat, good  foveal reflex, mild RPE mottling and clumping, +drusen, No heme or edema  ? Vessels attenuated, mildly tortuous mild attenuation and tortuosity, mild AV crossing changes, mild copper wiring  ? Periphery attached, no heme attached, no heme  ? ?  ?  ? ?  ? ?Refraction   ? ? Wearing Rx   ? ?   Sphere Cylinder Axis Add  ? Right -2.00 +2.25 111 +2.75  ? Left -1.50 +1.50 094 +2.75  ? ?  ?  ? ?  ? ?IMAGING AND PROCEDURES  ?Imaging and Procedures for ? ?OCT, Retina - OU - Both Eyes   ? ?   ?Right Eye ?Quality was good. Central Foveal Thickness: 235. Progression has been stable. Findings include no SRF, intraretinal fluid, retinal drusen , normal foveal contour (Persistent IRF/edema superior macula---minimal improvement).  ? ?Left Eye ?Quality was good. Central Foveal Thickness: 260. Progression has been stable. Findings include normal foveal contour, no IRF, no SRF, vitreomacular adhesion , retinal drusen (Focal PED/drusen).  ? ?Notes ?*Images captured and stored on drive ? ?Diagnosis / Impression:  ?OD: BRVO with persistent IRF/edema superior macula -- minimal improvement ?OS: NFP, no SRF/IRF, drusen, VMA ? ?Clinical management:  ?See below ? ?Abbreviations: NFP - Normal foveal profile. CME - cystoid macular edema. PED - pigment epithelial detachment. IRF - intraretinal fluid. SRF - subretinal fluid. EZ - ellipsoid zone. ERM - epiretinal membrane. ORA - outer retinal atrophy. ORT - outer retinal tubulation. SRHM - subretinal hyper-reflective material ? ? ? ?  ? ?Intravitreal Injection, Pharmacologic Agent - OD - Right Eye   ? ?   ?Time Out ?03/13/2022. 2:59 PM. Confirmed correct patient, procedure, site, and patient consented.  ? ?Anesthesia ?Topical anesthesia was used. Anesthetic medications included Lidocaine 2%, Proparacaine 0.5%.  ? ?Procedure ?Preparation included 5% betadine to ocular surface, eyelid speculum. A (32 g) needle was used.  ? ?Injection: ?2 mg aflibercept 2 MG/0.05ML ?  Route:  Intravitreal, Site: Right Eye ?  NDC: [id number], Lot: [id number], Expiration date: 11/15/2022, Waste: 0.05 mL  ? ?Post-op ?Post injection exam found visual acuity of at least counting fingers. The patient t

## 2022-03-13 ENCOUNTER — Encounter (INDEPENDENT_AMBULATORY_CARE_PROVIDER_SITE_OTHER): Payer: Self-pay | Admitting: Ophthalmology

## 2022-03-13 ENCOUNTER — Other Ambulatory Visit: Payer: Self-pay

## 2022-03-13 ENCOUNTER — Ambulatory Visit (INDEPENDENT_AMBULATORY_CARE_PROVIDER_SITE_OTHER): Payer: Medicare Other | Admitting: Ophthalmology

## 2022-03-13 DIAGNOSIS — H35033 Hypertensive retinopathy, bilateral: Secondary | ICD-10-CM | POA: Diagnosis not present

## 2022-03-13 DIAGNOSIS — H25813 Combined forms of age-related cataract, bilateral: Secondary | ICD-10-CM

## 2022-03-13 DIAGNOSIS — I10 Essential (primary) hypertension: Secondary | ICD-10-CM

## 2022-03-13 DIAGNOSIS — H34831 Tributary (branch) retinal vein occlusion, right eye, with macular edema: Secondary | ICD-10-CM | POA: Diagnosis not present

## 2022-03-13 DIAGNOSIS — E119 Type 2 diabetes mellitus without complications: Secondary | ICD-10-CM

## 2022-03-14 ENCOUNTER — Encounter (INDEPENDENT_AMBULATORY_CARE_PROVIDER_SITE_OTHER): Payer: Self-pay | Admitting: Ophthalmology

## 2022-03-14 MED ORDER — AFLIBERCEPT 2MG/0.05ML IZ SOLN FOR KALEIDOSCOPE
2.0000 mg | INTRAVITREAL | Status: AC | PRN
  Administered 2022-03-14: 2 mg via INTRAVITREAL

## 2022-04-11 NOTE — Progress Notes (Signed)
?[facility] - Clinic Note ? ?04/24/2022 ? ?  ? ?CHIEF COMPLAINT ?Patient presents for Retina Follow Up ? ?HISTORY OF PRESENT ILLNESS: ?Kurt [name] is a 71 y.o. male who presents to the clinic today for:  ?HPI   ? ? Retina Follow Up   ?Patient presents with  CRVO/BRVO.  In right eye.  This started 6 weeks ago.  I, the attending physician,  performed the HPI with the patient and updated documentation appropriately. ? ?  ?  ? ? Comments   ?Patient here for 6 weeks retina follow up for BRVO OD. Patient states vision doing pretty good. No eye pain. Has new glasses on order. Sometimes feels like an eyelash in eye. When gets up in am can't see for 10 minutes till clears up. ? ?  ?  ?Last edited by Rennis Chris, MD on 04/24/2022  3:19 PM.  ?  ?Patient states he had a hard time focusing on the eye chart today, he has new glasses coming in ? ?Referring physician:  ?[facility] ?[address] ?[place],  [place] [address] ? ?HISTORICAL INFORMATION:  ? ?Selected notes from the MEDICAL RECORD NUMBER ?Diabetic Eval per Dr. Karleen Hampshire  ? ?CURRENT MEDICATIONS: ?No current outpatient medications on file. (Ophthalmic Drugs)  ? ?No current facility-administered medications for this visit. (Ophthalmic Drugs)  ? ?Current Outpatient Medications (Other)  ?Medication Sig  ? acetaminophen (TYLENOL) 650 MG CR tablet Take 1,300 mg by mouth every 8 (eight) hours as needed for pain.  ? aspirin 81 MG chewable tablet Chew 1 tablet (81 mg total) by mouth daily.  ? B-D ULTRAFINE III SHORT PEN 31G X 8 MM MISC Inject into the skin.  ? cephALEXin (KEFLEX) 500 MG capsule 1 capsule  ? clindamycin (CLEOCIN) 300 MG capsule   ? ELIQUIS 2.5 MG TABS tablet Take 2.5 mg by mouth 2 (two) times daily.  ? glipiZIDE (GLUCOTROL XL) 10 MG 24 hr tablet Take 10 mg by mouth at bedtime.   ? HYDROcodone-acetaminophen (NORCO) 7.5-325 MG tablet 1 tablet as needed  ? ketoconazole (NIZORAL) 2 % cream 1 application to feet and in between  toes  ? ketoconazole (NIZORAL) 2 % cream   ? LANTUS SOLOSTAR 100 UNIT/ML Solostar Pen Inject 34 Units into the skin at bedtime.  ? lisinopril (PRINIVIL,ZESTRIL) 20 MG tablet Take 20 mg by mouth daily.  ? lisinopril (ZESTRIL) 20 MG tablet   ? methocarbamol (ROBAXIN) 500 MG tablet Take 500 mg by mouth at bedtime as needed for muscle spasms.  ? ONETOUCH ULTRA test strip 1 each 2 (two) times daily.  ? oxyCODONE (OXY IR/ROXICODONE) 5 MG immediate release tablet Take 1 tablet (5 mg total) by mouth every 8 (eight) hours as needed for moderate pain (pain score 4-6).  ? simvastatin (ZOCOR) 20 MG tablet Take 20 mg by mouth every evening.  ? simvastatin (ZOCOR) 20 MG tablet   ? SitaGLIPtin-MetFORMIN HCl [phone] MG TB24 Take 1 tablet by mouth at bedtime.  ? ?No current facility-administered medications for this visit. (Other)  ? ?REVIEW OF SYSTEMS: ?ROS   ?Positive for: Musculoskeletal, Endocrine, Eyes ?Negative for: Constitutional, Gastrointestinal, Neurological, Skin, Genitourinary, HENT, Cardiovascular, Respiratory, Psychiatric, Allergic/Imm, Heme/Lymph ?Last edited by Laddie Aquas, COA on 04/24/2022  3:01 PM.  ?  ? ? ?ALLERGIES ?Allergies  ?Allergen Reactions  ? Augmentin [Amoxicillin-Pot Clavulanate] Nausea And Vomiting  ? Iodine Hives  ? Tape Rash  ?  Paper   ? ?PAST MEDICAL  HISTORY ?Past Medical History:  ?Diagnosis Date  ? Arthritis   ? Bronchitis   ? Bronchitis   ? Cataract   ? OU  ? Concussion   ? late 1990's  after a fall  ? GERD (gastroesophageal reflux disease)   ? History of kidney stones   ? Hypertension   ? Hypertensive retinopathy   ? OU  ? Osteogenesis imperfecta   ? PONV (postoperative nausea and vomiting)   ? pt has post polio syndrome  ? Post-polio syndrome   ? Type 2 diabetes mellitus (HCC) 12/25/2018  ? ?Past Surgical History:  ?Procedure Laterality Date  ? ANKLE FRACTURE SURGERY Bilateral   ? arm surgery Right   ? nerve surgery  ? COLONOSCOPY    ? ELBOW FRACTURE SURGERY Left   ? EYE MUSCLE SURGERY Left    ? LEG SURGERY Left   ? femur fracture with rod  ? REVERSE SHOULDER ARTHROPLASTY Right 11/10/2019  ? Procedure: RIGHT REVERSE SHOULDER ARTHROPLASTY;  Surgeon: Cammy Copaean, Gregory Scott, MD;  [facility];  Service: Orthopedics;  Laterality: Right;  ? TONSILLECTOMY    ? ?FAMILY HISTORY ?Family History  ?Problem Relation Age of Onset  ? Hypertension Mother   ? Diabetes Brother   ? ?SOCIAL HISTORY ?Social History  ? ?Tobacco Use  ? Smoking status: Never  ? Smokeless tobacco: Never  ?Vaping Use  ? Vaping Use: Never used  ?Substance Use Topics  ? Alcohol use: Yes  ?  Comment: 1 beer occasionally  ? Drug use: No  ?  ? ?  ?OPHTHALMIC EXAM: ?Base Eye Exam   ? ? Visual Acuity (Snellen - Linear)   ? ?   Right Left  ? Dist cc 20/40 -1 20/20 -2  ? Dist ph cc 20/25 +2   ? ? Correction: Glasses  ? ?  ?  ? ? Tonometry (Tonopen, 2:58 PM)   ? ?   Right Left  ? Pressure 12 14  ? ?  ?  ? ? Pupils   ? ?   Dark Light Shape React APD  ? Right 3 2 Round Brisk None  ? Left 3 2 Round Brisk None  ? ?  ?  ? ? Visual Fields (Counting fingers)   ? ?   Left Right  ?  Full Full  ? ?  ?  ? ? Extraocular Movement   ? ?   Right Left  ?  Full, Ortho Full, Ortho  ? ?  ?  ? ? Neuro/Psych   ? ? Oriented x3: Yes  ? Mood/Affect: Normal  ? ?  ?  ? ? Dilation   ? ? Both eyes: 1.0% Mydriacyl, 2.5% Phenylephrine @ 2:58 PM  ? ?  ?  ? ?  ? ?Slit Lamp and Fundus Exam   ? ? Slit Lamp Exam   ? ?   Right Left  ? Lids/Lashes Dermatochalasis - upper lid, mild Meibomian gland dysfunction Dermatochalasis - upper lid, Dermatochalasis - lower lid, mild Meibomian gland dysfunction  ? Conjunctiva/Sclera blue sclera blue sclera  ? Cornea arcus, 1+ PEE, mild tear film debris arcus, SCH  ? Anterior Chamber deep and clear deep and clear  ? Iris round and dilated, no NVI round and dilated, no NVI  ? Lens 2-3+ NS w/brunescence, 2-3+CS 2-3+ NS w/brunesecence, 2-3+CS  ? Anterior Vitreous mild syneresis, PVD, vitreous condensations mild syneresis  ? ?  ?  ? ? Fundus Exam   ? ?    Right Left  ?  Disc pink and sharp pink and sharp  ? C/D Ratio 0.5 0.4  ? Macula Good foveal reflex; trace persistent edema SN macula, mild focal laser changes, no heme flat, good foveal reflex, mild RPE mottling and clumping, +drusen, No heme or edema  ? Vessels attenuated, Tortuous mild attenuation and tortuosity, mild AV crossing changes, mild copper wiring  ? Periphery attached, no heme attached, no heme  ? ?  ?  ? ?  ? ?Refraction   ? ? Wearing Rx   ? ?   Sphere Cylinder Axis Add  ? Right -2.00 +2.25 111 +2.75  ? Left -1.50 +1.50 094 +2.75  ? ?  ?  ? ?  ? ?IMAGING AND PROCEDURES  ?Imaging and Procedures for ? ?OCT, Retina - OU - Both Eyes   ? ?   ?Right Eye ?Quality was good. Central Foveal Thickness: 236. Progression has been stable. Findings include no SRF, intraretinal fluid, retinal drusen , normal foveal contour (Persistent IRF SN macula).  ? ?Left Eye ?Quality was good. Central Foveal Thickness: 259. Progression has been stable. Findings include normal foveal contour, no IRF, no SRF, vitreomacular adhesion , retinal drusen (Focal PED/drusen).  ? ?Notes ?*Images captured and stored on drive ? ?Diagnosis / Impression:  ?OD: BRVO with Persistent IRF SN macula ?OS: NFP, no SRF/IRF, drusen, VMA ? ?Clinical management:  ?See below ? ?Abbreviations: NFP - Normal foveal profile. CME - cystoid macular edema. PED - pigment epithelial detachment. IRF - intraretinal fluid. SRF - subretinal fluid. EZ - ellipsoid zone. ERM - epiretinal membrane. ORA - outer retinal atrophy. ORT - outer retinal tubulation. SRHM - subretinal hyper-reflective material ? ? ? ?  ? ?Intravitreal Injection, Pharmacologic Agent - OD - Right Eye   ? ?   ?Time Out ?04/24/2022. 3:01 PM. Confirmed correct patient, procedure, site, and patient consented.  ? ?Anesthesia ?Topical anesthesia was used. Anesthetic medications included Lidocaine 2%, Proparacaine 0.5%.  ? ?Procedure ?Preparation included 5% betadine to ocular surface, eyelid speculum. A (32  g) needle was used.  ? ?Injection: ?2 mg aflibercept 2 MG/0.05ML ?  Route: Intravitreal, Site: Right Eye ?  NDC: L6038910, Lot: [id number], Expiration date: 03/17/2023, Waste: 0 mL  ? ?Post-op ?Post i

## 2022-04-24 ENCOUNTER — Encounter (INDEPENDENT_AMBULATORY_CARE_PROVIDER_SITE_OTHER): Payer: Self-pay | Admitting: Ophthalmology

## 2022-04-24 ENCOUNTER — Ambulatory Visit (INDEPENDENT_AMBULATORY_CARE_PROVIDER_SITE_OTHER): Payer: Medicare Other | Admitting: Ophthalmology

## 2022-04-24 DIAGNOSIS — I10 Essential (primary) hypertension: Secondary | ICD-10-CM

## 2022-04-24 DIAGNOSIS — H35033 Hypertensive retinopathy, bilateral: Secondary | ICD-10-CM

## 2022-04-24 DIAGNOSIS — H25813 Combined forms of age-related cataract, bilateral: Secondary | ICD-10-CM | POA: Diagnosis not present

## 2022-04-24 DIAGNOSIS — E119 Type 2 diabetes mellitus without complications: Secondary | ICD-10-CM

## 2022-04-24 DIAGNOSIS — H34831 Tributary (branch) retinal vein occlusion, right eye, with macular edema: Secondary | ICD-10-CM

## 2022-04-24 MED ORDER — AFLIBERCEPT 2MG/0.05ML IZ SOLN FOR KALEIDOSCOPE
2.0000 mg | INTRAVITREAL | Status: AC | PRN
  Administered 2022-04-24: 2 mg via INTRAVITREAL

## 2022-05-23 NOTE — Progress Notes (Signed)
[facility] - Clinic Note  06/05/2022     CHIEF COMPLAINT Patient presents for Retina Follow Up  HISTORY OF PRESENT ILLNESS: Kurt [name] is a 71 y.o. male who presents to the clinic today for:  HPI     Retina Follow Up   Patient presents with  CRVO/BRVO.  In right eye.  This started 6 weeks ago.  I, the attending physician,  performed the HPI with the patient and updated documentation appropriately.        Comments   Patient here for 6 weeks retina follow up for BRVO OD. Patient states vision doing alright. No eye pain. OD itches sometimes. On an antibiotic for 10 days. Started 4 days ago.       Last edited by Rennis ChrisZamora, Isobel Eisenhuth, MD on 06/06/2022 12:02 AM.    Patient states   [facility]  [facility] [address] [place],  [place] [id number]  HISTORICAL INFORMATION:   Selected notes from the MEDICAL RECORD NUMBER Diabetic Eval per Dr. Karleen HampshireSpencer   CURRENT MEDICATIONS: No current outpatient medications on file. (Ophthalmic Drugs)   No current facility-administered medications for this visit. (Ophthalmic Drugs)   Current Outpatient Medications (Other)  Medication Sig   acetaminophen (TYLENOL) 650 MG CR tablet Take 1,300 mg by mouth every 8 (eight) hours as needed for pain.   aspirin 81 MG chewable tablet Chew 1 tablet (81 mg total) by mouth daily.   B-D ULTRAFINE III SHORT PEN 31G X 8 MM MISC Inject into the skin.   cephALEXin (KEFLEX) 500 MG capsule 1 capsule   clindamycin (CLEOCIN) 300 MG capsule    ELIQUIS 2.5 MG TABS tablet Take 2.5 mg by mouth 2 (two) times daily.   glipiZIDE (GLUCOTROL XL) 10 MG 24 hr tablet Take 10 mg by mouth at bedtime.    HYDROcodone-acetaminophen (NORCO) 7.5-325 MG tablet 1 tablet as needed   ketoconazole (NIZORAL) 2 % cream 1 application to feet and in between toes   ketoconazole (NIZORAL) 2 % cream    LANTUS SOLOSTAR 100 UNIT/ML Solostar Pen Inject 34 Units into the skin at bedtime.    lisinopril (PRINIVIL,ZESTRIL) 20 MG tablet Take 20 mg by mouth daily.   lisinopril (ZESTRIL) 20 MG tablet    methocarbamol (ROBAXIN) 500 MG tablet Take 500 mg by mouth at bedtime as needed for muscle spasms.   ONETOUCH ULTRA test strip 1 each 2 (two) times daily.   oxyCODONE (OXY IR/ROXICODONE) 5 MG immediate release tablet Take 1 tablet (5 mg total) by mouth every 8 (eight) hours as needed for moderate pain (pain score 4-6).   simvastatin (ZOCOR) 20 MG tablet Take 20 mg by mouth every evening.   simvastatin (ZOCOR) 20 MG tablet    SitaGLIPtin-MetFORMIN HCl [id number] MG TB24 Take 1 tablet by mouth at bedtime.   No current facility-administered medications for this visit. (Other)   REVIEW OF SYSTEMS: ROS   Positive for: Musculoskeletal, Endocrine, Eyes Negative for: Constitutional, Gastrointestinal, Neurological, Skin, Genitourinary, HENT, Cardiovascular, Respiratory, Psychiatric, Allergic/Imm, Heme/Lymph Last edited by Laddie Aquaslarke, Rebecca S, COA on 06/05/2022  3:16 PM.     ALLERGIES Allergies  Allergen Reactions   Augmentin [Amoxicillin-Pot Clavulanate] Nausea And Vomiting   Iodine Hives   Tape Rash    Paper    PAST MEDICAL HISTORY Past Medical History:  Diagnosis Date   Arthritis    Bronchitis    Bronchitis    Cataract    OU   Concussion  late 1990's  after a fall   GERD (gastroesophageal reflux disease)    History of kidney stones    Hypertension    Hypertensive retinopathy    OU   Osteogenesis imperfecta    PONV (postoperative nausea and vomiting)    pt has post polio syndrome   Post-polio syndrome    Type 2 diabetes mellitus (HCC) 12/25/2018   Past Surgical History:  Procedure Laterality Date   ANKLE FRACTURE SURGERY Bilateral    arm surgery Right    nerve surgery   COLONOSCOPY     ELBOW FRACTURE SURGERY Left    EYE MUSCLE SURGERY Left    LEG SURGERY Left    femur fracture with rod   REVERSE SHOULDER ARTHROPLASTY Right 11/10/2019   Procedure: RIGHT REVERSE  SHOULDER ARTHROPLASTY;  Surgeon: Cammy Copa, MD;  Location: [facility] OR;  Service: Orthopedics;  Laterality: Right;   TONSILLECTOMY     FAMILY HISTORY Family History  Problem Relation Age of Onset   Hypertension Mother    Diabetes Brother    SOCIAL HISTORY Social History   Tobacco Use   Smoking status: Never   Smokeless tobacco: Never  Vaping Use   Vaping Use: Never used  Substance Use Topics   Alcohol use: Yes    Comment: 1 beer occasionally   Drug use: No       OPHTHALMIC EXAM: Base Eye Exam     Visual Acuity (Snellen - Linear)       Right Left   Dist cc 20/40 20/20   Dist ph cc 20/25 -1     Correction: Glasses  Using new glasses.        Tonometry (Tonopen, 3:12 PM)       Right Left   Pressure 18 19         Pupils       Dark Light Shape React APD   Right 3 2 Round Brisk None   Left 3 2 Round Brisk None         Visual Fields (Counting fingers)       Left Right    Full Full         Extraocular Movement       Right Left    Full, Ortho Full, Ortho         Neuro/Psych     Oriented x3: Yes   Mood/Affect: Normal         Dilation     Both eyes: 1.0% Mydriacyl, 2.5% Phenylephrine @ 3:12 PM           Slit Lamp and Fundus Exam     Slit Lamp Exam       Right Left   Lids/Lashes Dermatochalasis - upper lid, mild Meibomian gland dysfunction Dermatochalasis - upper lid, Dermatochalasis - lower lid, mild Meibomian gland dysfunction   Conjunctiva/Sclera blue sclera blue sclera   Cornea arcus, 1+ PEE, mild tear film debris arcus, SCH   Anterior Chamber deep and clear deep and clear   Iris round and dilated, no NVI round and dilated, no NVI   Lens 2-3+ NS w/brunescence, 2-3+CS 2-3+ NS w/brunesecence, 2-3+CS   Anterior Vitreous mild syneresis, PVD, vitreous condensations mild syneresis         Fundus Exam       Right Left   Disc pink and sharp pink and sharp   C/D Ratio 0.5 0.4   Macula Good foveal reflex; trace persistent  edema SN macula, mild focal laser changes, no  heme flat, good foveal reflex, mild RPE mottling and clumping, +drusen, No heme or edema   Vessels attenuated, Tortuous mild attenuation and tortuosity, mild AV crossing changes, mild copper wiring   Periphery attached, no heme attached, no heme           Refraction     Wearing Rx       Sphere Cylinder Axis Add   Right -2.50 +2.00 105 +2.75   Left -1.50 +1.75 101 +2.75  New glasses.          IMAGING AND PROCEDURES  Imaging and Procedures for  OCT, Retina - OU - Both Eyes       Right Eye Quality was good. Central Foveal Thickness: 236. Progression has been stable. Findings include normal foveal contour, no SRF, retinal drusen , intraretinal fluid (Persistent IRF SN macula).   Left Eye Quality was good. Central Foveal Thickness: 262. Progression has been stable. Findings include normal foveal contour, no IRF, no SRF, retinal drusen , vitreomacular adhesion (Focal PED/drusen).   Notes *Images captured and stored on drive  Diagnosis / Impression:  OD: BRVO with persistent IRF SN macula OS: NFP, no SRF/IRF, drusen, VMA  Clinical management:  See below  Abbreviations: NFP - Normal foveal profile. CME - cystoid macular edema. PED - pigment epithelial detachment. IRF - intraretinal fluid. SRF - subretinal fluid. EZ - ellipsoid zone. ERM - epiretinal membrane. ORA - outer retinal atrophy. ORT - outer retinal tubulation. SRHM - subretinal hyper-reflective material       Intravitreal Injection, Pharmacologic Agent - OD - Right Eye       Time Out 06/05/2022. 3:54 PM. Confirmed correct patient, procedure, site, and patient consented.   Anesthesia Topical anesthesia was used. Anesthetic medications included Lidocaine 2%, Proparacaine 0.5%.   Procedure Preparation included 5% betadine to ocular surface, eyelid speculum. A (32 g) needle was used.   Injection: 2 mg aflibercept 2 MG/0.05ML   Route: Intravitreal, Site: Right  Eye   NDC: L6038910, Lot: [id number], Expiration date: 03/16/2023, Waste: 0 mL   Post-op Post injection exam found visual acuity of at least counting fingers. The patient tolerated the procedure well. There were no complications. The patient received written and verbal post procedure care education. Post injection medications were not given.             ASSESSMENT/PLAN:   ICD-10-CM   1. 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 Essential hypertension  I10     4. Hypertensive retinopathy of both eyes  H35.033     5. Combined forms of age-related cataract of both eyes  H25.813      1. BRVO with CME OD  - s/p IVA OD #1 (01.19.21), #2 (02.16.21), #3 (03.16.21), #4 (4.13.21) -- IVA resistance             - s/p IVE OD #1 (05.11.21--sample), #2 (06.08.21), #3 (07.06.21), #4 (8.3.21), #5 (08.31.21), #6 (09.28.21), #7 (10.26.21), #8 (11.23.21), #9 (12.22.21), #10 (1.24.22), #11 (2.22.22), #12 (3.22.22), #13 (04.19.22), #14 (05.25.22), #15 (06.29.22), #16 (07.27.22), #17 (08.24.22), #18 (09.21.22), #19 (10.19.22), #20 (11.22.22), #21 (12.20.22), #22 (01.18.23), #23 (02.22.23), #24 (03.28.23), #25 (05.09.23)  - s/p focal laser OD (07.13.22)  - BCVA stable  at 20/25  - exam with focal edema, IRH, CWS superonasal macula -- improved  - OCT shows persistent IRF SN macula - stable at 6 wks  - recommend IVE OD #26 today (06.20.23) w/ f/u extended to 7 wks  - RBA of procedure discussed, questions answered  - informed consent obtained  - Avastin informed consent form re-signed and scanned on 05.09.23  - Eylea informed consent form re-signed and scanned on 05.09.23  - see procedure note  - Eylea4U benefits investigation started, 05.11.21 -- approved through [employer] for  2023  - F/U 7 wks -- DFE/OCT/possible injection  2. Diabetes mellitus, type 2 without retinopathy OU  - The incidence, risk factors for progression, natural history and treatment options for diabetic retinopathy  were discussed with patient.    - The need for close monitoring of blood glucose, blood pressure, and serum lipids, avoiding cigarette or any type of tobacco, and the need for long term follow up was also discussed with patient.  - monitor  3,4. Hypertensive retinopathy OU  - discussed importance of tight BP control  - monitor  5. Age related cataracts OU   - The symptoms of cataract, surgical options, and treatments and risks were discussed with patient.  - discussed diagnosis and progression  - monitor  Ophthalmic Meds Ordered this visit:  Meds ordered this encounter  Medications   aflibercept (EYLEA) SOLN 2 mg     Return in about 7 weeks (around 07/24/2022) for f/u BRVO OD, DFE, OCT.  There are no Patient Instructions on file for this visit.  This document serves as a record of services personally performed by Karie Chimera, MD, PhD. It was created on their behalf by Gerilyn Nestle, COT an ophthalmic technician. The creation of this record is the provider's dictation and/or activities during the visit.    Electronically signed by:  Gerilyn Nestle, COT  05/23/22 @ 12:03 AM   This document serves as a record of services personally performed by Karie Chimera, MD, PhD. It was created on their behalf by Glee Arvin. Manson Passey, OA an ophthalmic technician. The creation of this record is the provider's dictation and/or activities during the visit.    Electronically signed by: Glee Arvin. Manson Passey, [place] 06.20.2023 12:03 AM  Karie Chimera, M.D., Ph.D. Diseases & Surgery of the Retina and Vitreous [facility]  I have reviewed the above documentation for accuracy and completeness, and I agree with the above. Karie Chimera, M.D., Ph.D. 06/06/22 12:03  AM  Abbreviations: M myopia (nearsighted); A astigmatism; H hyperopia (farsighted); P presbyopia; Mrx spectacle prescription;  CTL contact lenses; OD right eye; OS left eye; OU both eyes  XT exotropia; ET esotropia; PEK punctate epithelial keratitis; PEE punctate epithelial erosions; DES dry eye syndrome; MGD meibomian gland dysfunction; ATs artificial tears; PFAT's preservative free artificial tears; NSC nuclear sclerotic cataract; PSC posterior subcapsular cataract; ERM epi-retinal membrane; PVD posterior vitreous detachment; RD retinal detachment; DM diabetes mellitus; DR diabetic retinopathy; NPDR non-proliferative diabetic retinopathy; PDR proliferative diabetic retinopathy; CSME clinically significant macular edema; DME diabetic macular edema; dbh dot blot hemorrhages; CWS cotton wool spot; POAG primary open angle glaucoma; C/D cup-to-disc ratio; HVF humphrey visual field; GVF goldmann visual field; OCT optical coherence tomography; IOP intraocular pressure; BRVO Branch retinal vein occlusion; CRVO central retinal vein occlusion; CRAO central retinal artery occlusion; BRAO branch retinal artery occlusion; RT retinal tear; SB scleral buckle; PPV pars plana vitrectomy; VH Vitreous hemorrhage; PRP panretinal laser photocoagulation;  IVK intravitreal kenalog; VMT vitreomacular traction; MH Macular hole;  NVD neovascularization of the disc; NVE neovascularization elsewhere; AREDS age related eye disease study; ARMD age related macular degeneration; POAG primary open angle glaucoma; EBMD epithelial/anterior basement membrane dystrophy; ACIOL anterior chamber intraocular lens; IOL intraocular lens; PCIOL posterior chamber intraocular lens; Phaco/IOL phacoemulsification with intraocular lens placement; [place] photorefractive keratectomy; LASIK laser assisted in situ keratomileusis; HTN hypertension; DM diabetes mellitus; COPD chronic obstructive pulmonary disease

## 2022-06-05 ENCOUNTER — Encounter (INDEPENDENT_AMBULATORY_CARE_PROVIDER_SITE_OTHER): Payer: Self-pay | Admitting: Ophthalmology

## 2022-06-05 ENCOUNTER — Ambulatory Visit (INDEPENDENT_AMBULATORY_CARE_PROVIDER_SITE_OTHER): Payer: Medicare Other | Admitting: Ophthalmology

## 2022-06-05 DIAGNOSIS — H35033 Hypertensive retinopathy, bilateral: Secondary | ICD-10-CM | POA: Diagnosis not present

## 2022-06-05 DIAGNOSIS — H25813 Combined forms of age-related cataract, bilateral: Secondary | ICD-10-CM

## 2022-06-05 DIAGNOSIS — E119 Type 2 diabetes mellitus without complications: Secondary | ICD-10-CM

## 2022-06-05 DIAGNOSIS — I10 Essential (primary) hypertension: Secondary | ICD-10-CM

## 2022-06-05 DIAGNOSIS — H34831 Tributary (branch) retinal vein occlusion, right eye, with macular edema: Secondary | ICD-10-CM

## 2022-06-05 MED ORDER — AFLIBERCEPT 2MG/0.05ML IZ SOLN FOR KALEIDOSCOPE
2.0000 mg | INTRAVITREAL | Status: AC | PRN
  Administered 2022-06-05: 2 mg via INTRAVITREAL

## 2022-07-03 ENCOUNTER — Ambulatory Visit (HOSPITAL_COMMUNITY): Payer: Medicare Other | Admitting: Physical Therapy

## 2022-07-17 NOTE — Progress Notes (Signed)
[facility] - Clinic Note  07/23/2022     CHIEF COMPLAINT Patient presents for Retina Follow Up  HISTORY OF PRESENT ILLNESS: Kurt [name] is a 71 y.o. male who presents to the clinic today for:  HPI     Retina Follow Up   Patient presents with  CRVO/BRVO.  In right eye.  This started 7 weeks ago.  I, the attending physician,  performed the HPI with the patient and updated documentation appropriately.        Comments   Patient here for 7 weeks retina follow up for BRVO OD. Patient states vision doing pretty good. No eye pain. Sometimes gets blurred. Sometimes with allergies.      Last edited by Rennis Chris, MD on 07/23/2022  5:13 PM.      Referring physician:  [facility] [address] [place],  [place] [address]  HISTORICAL INFORMATION:   Selected notes from the MEDICAL RECORD NUMBER Diabetic Eval per Dr. Karleen Hampshire   CURRENT MEDICATIONS: No current outpatient medications on file. (Ophthalmic Drugs)   No current facility-administered medications for this visit. (Ophthalmic Drugs)   Current Outpatient Medications (Other)  Medication Sig   acetaminophen (TYLENOL) 650 MG CR tablet Take 1,300 mg by mouth every 8 (eight) hours as needed for pain.   aspirin 81 MG chewable tablet Chew 1 tablet (81 mg total) by mouth daily.   B-D ULTRAFINE III SHORT PEN 31G X 8 MM MISC Inject into the skin.   cephALEXin (KEFLEX) 500 MG capsule 1 capsule   clindamycin (CLEOCIN) 300 MG capsule    ELIQUIS 2.5 MG TABS tablet Take 2.5 mg by mouth 2 (two) times daily.   glipiZIDE (GLUCOTROL XL) 10 MG 24 hr tablet Take 10 mg by mouth at bedtime.    HYDROcodone-acetaminophen (NORCO) 7.5-325 MG tablet 1 tablet as needed   ketoconazole (NIZORAL) 2 % cream 1 application to feet and in between toes   ketoconazole (NIZORAL) 2 % cream    LANTUS SOLOSTAR 100 UNIT/ML Solostar Pen Inject 34 Units into the skin at bedtime.   lisinopril (PRINIVIL,ZESTRIL) 20 MG  tablet Take 20 mg by mouth daily.   lisinopril (ZESTRIL) 20 MG tablet    methocarbamol (ROBAXIN) 500 MG tablet Take 500 mg by mouth at bedtime as needed for muscle spasms.   ONETOUCH ULTRA test strip 1 each 2 (two) times daily.   oxyCODONE (OXY IR/ROXICODONE) 5 MG immediate release tablet Take 1 tablet (5 mg total) by mouth every 8 (eight) hours as needed for moderate pain (pain score 4-6).   simvastatin (ZOCOR) 20 MG tablet Take 20 mg by mouth every evening.   simvastatin (ZOCOR) 20 MG tablet    SitaGLIPtin-MetFORMIN HCl [phone] MG TB24 Take 1 tablet by mouth at bedtime.   No current facility-administered medications for this visit. (Other)   REVIEW OF SYSTEMS: ROS   Positive for: Musculoskeletal, Endocrine, Eyes Negative for: Constitutional, Gastrointestinal, Neurological, Skin, Genitourinary, HENT, Cardiovascular, Respiratory, Psychiatric, Allergic/Imm, Heme/Lymph Last edited by Laddie Aquas, COA on 07/23/2022  3:14 PM.      ALLERGIES Allergies  Allergen Reactions   Augmentin [Amoxicillin-Pot Clavulanate] Nausea And Vomiting   Iodine Hives   Tape Rash    Paper    PAST MEDICAL HISTORY Past Medical History:  Diagnosis Date   Arthritis    Bronchitis    Bronchitis    Cataract    OU   Concussion    late 1990's  after a  fall   GERD (gastroesophageal reflux disease)    History of kidney stones    Hypertension    Hypertensive retinopathy    OU   Osteogenesis imperfecta    PONV (postoperative nausea and vomiting)    pt has post polio syndrome   Post-polio syndrome    Type 2 diabetes mellitus (HCC) 12/25/2018   Past Surgical History:  Procedure Laterality Date   ANKLE FRACTURE SURGERY Bilateral    arm surgery Right    nerve surgery   COLONOSCOPY     ELBOW FRACTURE SURGERY Left    EYE MUSCLE SURGERY Left    LEG SURGERY Left    femur fracture with rod   REVERSE SHOULDER ARTHROPLASTY Right 11/10/2019   Procedure: RIGHT REVERSE SHOULDER ARTHROPLASTY;  Surgeon: Cammy Copa, MD;  Location: [facility] OR;  Service: Orthopedics;  Laterality: Right;   TONSILLECTOMY     FAMILY HISTORY Family History  Problem Relation Age of Onset   Hypertension Mother    Diabetes Brother    SOCIAL HISTORY Social History   Tobacco Use   Smoking status: Never   Smokeless tobacco: Never  Vaping Use   Vaping Use: Never used  Substance Use Topics   Alcohol use: Yes    Comment: 1 beer occasionally   Drug use: No       OPHTHALMIC EXAM: Base Eye Exam     Visual Acuity (Snellen - Linear)       Right Left   Dist cc 20/40 20/20 -1   Dist ph cc 20/25 -2     Correction: Glasses         Tonometry (Tonopen, 3:12 PM)       Right Left   Pressure 14 14         Pupils       Dark Light Shape React APD   Right 3 2 Round Brisk None   Left 3 2 Round Brisk None         Visual Fields (Counting fingers)       Left Right    Full Full         Extraocular Movement       Right Left    Full, Ortho Full, Ortho         Neuro/Psych     Oriented x3: Yes   Mood/Affect: Normal         Dilation     Both eyes: 1.0% Mydriacyl, 2.5% Phenylephrine @ 3:11 PM           Slit Lamp and Fundus Exam     Slit Lamp Exam       Right Left   Lids/Lashes Dermatochalasis - upper lid, mild Meibomian gland dysfunction Dermatochalasis - upper lid, Dermatochalasis - lower lid, mild Meibomian gland dysfunction   Conjunctiva/Sclera blue sclera blue sclera   Cornea arcus, 1+ PEE, mild tear film debris arcus, SCH   Anterior Chamber deep and clear deep and clear   Iris round and dilated, no NVI round and dilated, no NVI   Lens 2-3+ NS w/brunescence, 2-3+CS 2-3+ NS w/brunesecence, 2-3+CS   Anterior Vitreous mild syneresis, PVD, vitreous condensations mild syneresis         Fundus Exam       Right Left   Disc pink and sharp pink and sharp   C/D Ratio 0.5 0.4   Macula Good foveal reflex; trace persistent edema SN macula, mild focal laser changes, no heme flat,  good foveal reflex, mild RPE  mottling and clumping, +drusen, No heme or edema   Vessels attenuated, Tortuous mild attenuation and tortuosity, mild AV crossing changes, mild copper wiring   Periphery attached, no heme attached, no heme           Refraction     Wearing Rx       Sphere Cylinder Axis Add   Right -2.50 +2.00 105 +2.75   Left -1.50 +1.75 101 +2.75           IMAGING AND PROCEDURES  Imaging and Procedures for  OCT, Retina - OU - Both Eyes       Right Eye Quality was good. Central Foveal Thickness: 236. Progression has improved. Findings include normal foveal contour, no SRF, retinal drusen , intraretinal fluid, outer retinal atrophy (Mild interval improvement in IRF SN macula).   Left Eye Quality was good. Central Foveal Thickness: 262. Progression has been stable. Findings include normal foveal contour, no IRF, no SRF, retinal drusen , vitreomacular adhesion (Focal PED/drusen).   Notes *Images captured and stored on drive  Diagnosis / Impression:  OD: BRVO with mild interval improvement in IRF SN macula OS: NFP, no SRF/IRF, drusen, VMA  Clinical management:  See below  Abbreviations: NFP - Normal foveal profile. CME - cystoid macular edema. PED - pigment epithelial detachment. IRF - intraretinal fluid. SRF - subretinal fluid. EZ - ellipsoid zone. ERM - epiretinal membrane. ORA - outer retinal atrophy. ORT - outer retinal tubulation. SRHM - subretinal hyper-reflective material       Intravitreal Injection, Pharmacologic Agent - OD - Right Eye       Time Out 07/23/2022. 3:59 PM. Confirmed correct patient, procedure, site, and patient consented.   Anesthesia Topical anesthesia was used. Anesthetic medications included Lidocaine 2%, Proparacaine 0.5%.   Procedure Preparation included 5% betadine to ocular surface, eyelid speculum. A (32 g) needle was used.   Injection: 2 mg aflibercept 2 MG/0.05ML   Route: Intravitreal, Site: Right Eye   NDC:  [id number], Lot: [id number], Expiration date: 04/10/2023, Waste: 0 mL   Post-op Post injection exam found visual acuity of at least counting fingers. The patient tolerated the procedure well. There were no complications. The patient received written and verbal post procedure care education. Post injection medications were not given.            ASSESSMENT/PLAN:   ICD-10-CM   1. 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 Essential hypertension  I10     4. Hypertensive retinopathy of both eyes  H35.033     5. Combined forms of age-related cataract of both eyes  H25.813      1. BRVO with CME OD  - s/p IVA OD #1 (01.19.21), #2 (02.16.21), #3 (03.16.21), #4 (4.13.21) -- IVA resistance             - s/p IVE OD #1 (05.11.21--sample), #2 (06.08.21), #3 (07.06.21), #4 (8.3.21), #5 (08.31.21), #6 (09.28.21), #7 (10.26.21), #8 (11.23.21), #9 (12.22.21), #10 (1.24.22), #11 (2.22.22), #12 (3.22.22), #13 (04.19.22), #14 (05.25.22), #15 (06.29.22), #16 (07.27.22), #17 (08.24.22), #18 (09.21.22), #19 (10.19.22), #20 (11.22.22), #21 (12.20.22), #22 (01.18.23), #23 (02.22.23), #24 (03.28.23), #25 (05.09.23), #26 (06.20.23)  - s/p focal laser OD (07.13.22)  - BCVA stable  at 20/25  - exam with focal edema, IRH, CWS superonasal macula -- improved  - OCT shows BRVO with mild interval improvement in IRF SN macula at 7 weeks  - recommend IVE OD #27 today (08.07.23) w/ f/u extended to 8 wks  - RBA of procedure discussed, questions answered  - informed consent obtained  - Avastin informed consent form re-signed and scanned on 05.09.23  - Eylea informed consent form re-signed and scanned on 05.09.23  - see procedure note  - Eylea4U benefits investigation started, 05.11.21 -- approved  through [employer] for 2023  - F/U 8 wks -- DFE/OCT/possible injection, tx and ext as able  2. Diabetes mellitus, type 2 without retinopathy OU  - The incidence, risk factors for progression, natural history and treatment options for diabetic retinopathy  were discussed with patient.    - The need for close monitoring of blood glucose, blood pressure, and serum lipids, avoiding cigarette or any type of tobacco, and the need for long term follow up was also discussed with patient.  - monitor  3,4. Hypertensive retinopathy OU  - discussed importance of tight BP control  - monitor  5. Age related cataracts OU   - The symptoms of cataract, surgical options, and treatments and risks were discussed with patient.  - discussed diagnosis and progression  - monitor  Ophthalmic Meds Ordered this visit:  Meds ordered this encounter  Medications   aflibercept (EYLEA) SOLN 2 mg     Return in about 8 weeks (around 09/17/2022) for f/u BRVO OD, DFE, OCT.  There are no Patient Instructions on file for this visit.  This document serves as a record of services personally performed by Gardiner Sleeper, MD, PhD. It was created on their behalf by San Jetty. Owens Shark, OA an ophthalmic technician. The creation of this record is the provider's dictation and/or activities during the visit.    Electronically signed by: San Jetty. Owens Shark, [place] 08.01.2023 5:16 PM   Gardiner Sleeper, M.D., Ph.D. Diseases & Surgery of the Retina and Vitreous [facility]  I have reviewed the above documentation for accuracy and completeness, and I agree with the above. Gardiner Sleeper, M.D., Ph.D. 07/23/22 5:19 PM   Abbreviations: M myopia (nearsighted); A astigmatism; H hyperopia (farsighted); P presbyopia; Mrx spectacle prescription;  CTL contact lenses; OD right eye; OS left eye; OU both eyes  XT exotropia; ET esotropia; PEK punctate epithelial keratitis; PEE punctate epithelial erosions; DES dry eye syndrome;  MGD meibomian gland dysfunction; ATs artificial tears; PFAT's preservative free artificial tears; [place] nuclear sclerotic cataract; PSC posterior subcapsular cataract; ERM epi-retinal membrane; PVD posterior vitreous detachment; RD retinal detachment; DM diabetes mellitus; DR diabetic retinopathy; NPDR non-proliferative diabetic retinopathy; PDR proliferative diabetic retinopathy; CSME clinically significant macular edema; DME diabetic macular edema; dbh dot blot hemorrhages; CWS cotton wool spot; POAG primary open angle glaucoma; C/D cup-to-disc ratio; HVF humphrey visual field; GVF goldmann visual field; OCT optical coherence tomography; IOP intraocular pressure; BRVO Branch retinal vein occlusion; CRVO central retinal vein occlusion; CRAO central retinal artery occlusion; BRAO branch retinal artery occlusion; RT retinal tear; SB scleral buckle; PPV pars plana vitrectomy; VH Vitreous hemorrhage; PRP panretinal laser photocoagulation; IVK intravitreal kenalog; VMT vitreomacular traction; MH Macular hole;  NVD neovascularization of the disc; NVE neovascularization elsewhere; AREDS age related eye disease study; ARMD age related macular degeneration; POAG primary open angle glaucoma; EBMD epithelial/anterior basement membrane dystrophy; ACIOL anterior chamber intraocular lens; IOL intraocular lens; PCIOL posterior chamber  intraocular lens; Phaco/IOL phacoemulsification with intraocular lens placement; [place] photorefractive keratectomy; LASIK laser assisted in situ keratomileusis; HTN hypertension; DM diabetes mellitus; COPD chronic obstructive pulmonary disease

## 2022-07-23 ENCOUNTER — Encounter (INDEPENDENT_AMBULATORY_CARE_PROVIDER_SITE_OTHER): Payer: Self-pay | Admitting: Ophthalmology

## 2022-07-23 ENCOUNTER — Ambulatory Visit (INDEPENDENT_AMBULATORY_CARE_PROVIDER_SITE_OTHER): Payer: Medicare Other | Admitting: Ophthalmology

## 2022-07-23 DIAGNOSIS — E119 Type 2 diabetes mellitus without complications: Secondary | ICD-10-CM

## 2022-07-23 DIAGNOSIS — H25813 Combined forms of age-related cataract, bilateral: Secondary | ICD-10-CM | POA: Diagnosis not present

## 2022-07-23 DIAGNOSIS — H34831 Tributary (branch) retinal vein occlusion, right eye, with macular edema: Secondary | ICD-10-CM | POA: Diagnosis not present

## 2022-07-23 DIAGNOSIS — I10 Essential (primary) hypertension: Secondary | ICD-10-CM | POA: Diagnosis not present

## 2022-07-23 DIAGNOSIS — H35033 Hypertensive retinopathy, bilateral: Secondary | ICD-10-CM

## 2022-07-23 MED ORDER — AFLIBERCEPT 2MG/0.05ML IZ SOLN FOR KALEIDOSCOPE
2.0000 mg | INTRAVITREAL | Status: AC | PRN
  Administered 2022-07-23: 2 mg via INTRAVITREAL

## 2022-07-24 ENCOUNTER — Encounter (INDEPENDENT_AMBULATORY_CARE_PROVIDER_SITE_OTHER): Payer: Medicare Other | Admitting: Ophthalmology

## 2022-08-28 ENCOUNTER — Encounter (HOSPITAL_COMMUNITY): Payer: Self-pay

## 2022-08-28 ENCOUNTER — Inpatient Hospital Stay (HOSPITAL_COMMUNITY)
Admission: EM | Admit: 2022-08-28 | Discharge: 2022-09-01 | DRG: 603 | Disposition: A | Discharge status: Home / Self Care | Payer: Medicare Other | Attending: Internal Medicine | Admitting: Internal Medicine

## 2022-08-28 ENCOUNTER — Other Ambulatory Visit: Payer: Self-pay

## 2022-08-28 DIAGNOSIS — Z6834 Body mass index (BMI) 34.0-34.9, adult: Secondary | ICD-10-CM

## 2022-08-28 DIAGNOSIS — Z7984 Long term (current) use of oral hypoglycemic drugs: Secondary | ICD-10-CM | POA: Diagnosis not present

## 2022-08-28 DIAGNOSIS — Z79899 Other long term (current) drug therapy: Secondary | ICD-10-CM | POA: Diagnosis not present

## 2022-08-28 DIAGNOSIS — Z91048 Other nonmedicinal substance allergy status: Secondary | ICD-10-CM

## 2022-08-28 DIAGNOSIS — Z96611 Presence of right artificial shoulder joint: Secondary | ICD-10-CM | POA: Diagnosis present

## 2022-08-28 DIAGNOSIS — R7989 Other specified abnormal findings of blood chemistry: Secondary | ICD-10-CM | POA: Diagnosis not present

## 2022-08-28 DIAGNOSIS — E860 Dehydration: Secondary | ICD-10-CM | POA: Diagnosis present

## 2022-08-28 DIAGNOSIS — Z888 Allergy status to other drugs, medicaments and biological substances status: Secondary | ICD-10-CM

## 2022-08-28 DIAGNOSIS — E872 Acidosis, unspecified: Secondary | ICD-10-CM | POA: Diagnosis present

## 2022-08-28 DIAGNOSIS — Z7982 Long term (current) use of aspirin: Secondary | ICD-10-CM

## 2022-08-28 DIAGNOSIS — L03116 Cellulitis of left lower limb: Principal | ICD-10-CM | POA: Diagnosis present

## 2022-08-28 DIAGNOSIS — E119 Type 2 diabetes mellitus without complications: Secondary | ICD-10-CM

## 2022-08-28 DIAGNOSIS — Z880 Allergy status to penicillin: Secondary | ICD-10-CM

## 2022-08-28 DIAGNOSIS — I872 Venous insufficiency (chronic) (peripheral): Secondary | ICD-10-CM | POA: Diagnosis present

## 2022-08-28 DIAGNOSIS — E669 Obesity, unspecified: Secondary | ICD-10-CM | POA: Diagnosis present

## 2022-08-28 DIAGNOSIS — E1165 Type 2 diabetes mellitus with hyperglycemia: Secondary | ICD-10-CM | POA: Diagnosis present

## 2022-08-28 DIAGNOSIS — I10 Essential (primary) hypertension: Secondary | ICD-10-CM | POA: Diagnosis present

## 2022-08-28 DIAGNOSIS — Z794 Long term (current) use of insulin: Secondary | ICD-10-CM | POA: Diagnosis not present

## 2022-08-28 DIAGNOSIS — Z833 Family history of diabetes mellitus: Secondary | ICD-10-CM | POA: Diagnosis not present

## 2022-08-28 DIAGNOSIS — Z8249 Family history of ischemic heart disease and other diseases of the circulatory system: Secondary | ICD-10-CM

## 2022-08-28 DIAGNOSIS — R739 Hyperglycemia, unspecified: Secondary | ICD-10-CM

## 2022-08-28 DIAGNOSIS — E782 Mixed hyperlipidemia: Secondary | ICD-10-CM | POA: Diagnosis present

## 2022-08-28 DIAGNOSIS — K219 Gastro-esophageal reflux disease without esophagitis: Secondary | ICD-10-CM | POA: Diagnosis present

## 2022-08-28 LAB — COMPREHENSIVE METABOLIC PANEL
ALT: 19 U/L (ref 0–44)
AST: 16 U/L (ref 15–41)
Albumin: 3.9 g/dL (ref 3.5–5.0)
Alkaline Phosphatase: 67 U/L (ref 38–126)
Anion gap: 11 (ref 5–15)
BUN: 28 mg/dL — ABNORMAL HIGH (ref 8–23)
CO2: 20 mmol/L — ABNORMAL LOW (ref 22–32)
Calcium: 9.1 mg/dL (ref 8.9–10.3)
Chloride: 103 mmol/L (ref 98–111)
Creatinine, Ser: 0.92 mg/dL (ref 0.61–1.24)
GFR, Estimated: >60 mL/min (ref >60)
Glucose, Bld: 233 mg/dL — ABNORMAL HIGH (ref 70–99)
Potassium: 4.3 mmol/L (ref 3.5–5.1)
Sodium: 134 mmol/L — ABNORMAL LOW (ref 135–145)
Total Bilirubin: 0.7 mg/dL (ref 0.3–1.2)
Total Protein: 7.2 g/dL (ref 6.5–8.1)

## 2022-08-28 LAB — CBC WITH DIFFERENTIAL/PLATELET
Abs Immature Granulocytes: 0.05 10*3/uL (ref 0.00–0.07)
Basophils Absolute: 0.1 10*3/uL (ref 0.0–0.1)
Basophils Relative: 1 %
Eosinophils Absolute: 0.2 10*3/uL (ref 0.0–0.5)
Eosinophils Relative: 2 %
HCT: 44.9 % (ref 39.0–52.0)
Hemoglobin: 14.9 g/dL (ref 13.0–17.0)
Immature Granulocytes: 1 %
Lymphocytes Relative: 25 %
Lymphs Abs: 2.5 10*3/uL (ref 0.7–4.0)
MCH: 29.2 pg (ref 26.0–34.0)
MCHC: 33.2 g/dL (ref 30.0–36.0)
MCV: 88 fL (ref 80.0–100.0)
Monocytes Absolute: 0.8 10*3/uL (ref 0.1–1.0)
Monocytes Relative: 8 %
Neutro Abs: 6.6 10*3/uL (ref 1.7–7.7)
Neutrophils Relative %: 63 %
Platelets: 313 10*3/uL (ref 150–400)
RBC: 5.1 MIL/uL (ref 4.22–5.81)
RDW: 13.9 % (ref 11.5–15.5)
WBC: 10.2 10*3/uL (ref 4.0–10.5)
nRBC: 0 % (ref 0.0–0.2)

## 2022-08-28 LAB — LACTIC ACID, PLASMA
Lactic Acid, Venous: 2.3 mmol/L (ref 0.5–1.9)
Lactic Acid, Venous: 1.5 mmol/L (ref 0.5–1.9)

## 2022-08-28 MED ORDER — HYDROMORPHONE HCL 1 MG/ML IJ SOLN
0.5000 mg | Freq: Once | INTRAMUSCULAR | Status: AC
  Administered 2022-08-28: 0.5 mg via INTRAVENOUS
  Filled 2022-08-28: qty 0.5

## 2022-08-28 MED ORDER — PIPERACILLIN-TAZOBACTAM 3.375 G IVPB 30 MIN
3.3750 g | Freq: Once | INTRAVENOUS | Status: AC
  Administered 2022-08-28: 3.375 g via INTRAVENOUS
  Filled 2022-08-28: qty 50

## 2022-08-28 MED ORDER — SODIUM CHLORIDE 0.9 % IV BOLUS
1000.0000 mL | Freq: Once | INTRAVENOUS | Status: AC
  Administered 2022-08-28: 1000 mL via INTRAVENOUS

## 2022-08-28 NOTE — ED Triage Notes (Signed)
Pt presents to ED with complaints of cellulitis to bilateral lower legs. Pt was started on Clindamycin since last Tuesday, states swelling is getting worse.

## 2022-08-28 NOTE — ED Provider Notes (Signed)
[facility] EMERGENCY DEPARTMENT Provider Note   CSN: [id number] Arrival date & time: 08/28/22  1639     History  Chief Complaint  Patient presents with   Cellulitis    Kurt [name] is a 71 y.o. male.  Patient c/o cellulitis left leg. States symptoms onset 1-2 weeks ago. One week ago was placed on clindamycin. Since then, erythema has spread up to proximal left thigh and lower leg is no better/mildly worse. No fever or chills. No nausea/vomiting. Indicates compliant w meds (dose was clindamycin 300 mg bid). No chest pain or sob.   The history is provided by the patient and medical records.       Home Medications Prior to Admission medications   Medication Sig Start Date End Date Taking? Authorizing Provider  acetaminophen (TYLENOL) 650 MG CR tablet Take 1,300 mg by mouth every 8 (eight) hours as needed for pain.   Yes [provider]  aspirin 81 MG chewable tablet Chew 1 tablet (81 mg total) by mouth daily. 11/25/19  Yes Magnant, Charles L, PA-C  clindamycin (CLEOCIN) 300 MG capsule Take 300 mg by mouth 2 times daily at 12 noon and 4 pm. Start date 08/21/22 for 10 day supply   Yes [provider]  docusate sodium (COLACE) 100 MG capsule Take 100 mg by mouth 2 (two) times daily.   Yes [provider]  glipiZIDE (GLUCOTROL XL) 10 MG 24 hr tablet Take 10 mg by mouth at bedtime.    Yes [provider]  HYDROcodone-acetaminophen (NORCO) 7.5-325 MG tablet Take 1 tablet by mouth daily as needed for moderate pain. 06/15/21  Yes [provider]  ketoconazole (NIZORAL) 2 % cream Apply 1 Application topically daily.   Yes [provider]  LANTUS SOLOSTAR 100 UNIT/ML Solostar Pen Inject 34 Units into the skin at bedtime. 12/19/18  Yes [provider]  lisinopril (ZESTRIL) 20 MG tablet Take 20 mg by mouth daily.   Yes [provider]  methocarbamol (ROBAXIN) 500 MG tablet Take 500 mg by mouth every 8 (eight) hours as  needed for muscle spasms.   Yes [provider]  Multiple Vitamin (MULTIVITAMIN) tablet Take 1 tablet by mouth daily.   Yes [provider]  Omega-3 Fatty Acids (FISH OIL) 1000 MG CAPS Take by mouth.   Yes [provider]  simvastatin (ZOCOR) 20 MG tablet Take 20 mg by mouth every evening.   Yes [provider]  SitaGLIPtin-MetFORMIN HCl [phone] MG TB24 Take 1 tablet by mouth at bedtime.   Yes [provider]  zinc gluconate 50 MG tablet Take 50 mg by mouth daily.   Yes [provider]  B-D ULTRAFINE III SHORT PEN 31G X 8 MM MISC Inject into the skin. 09/28/21   [provider]  [facility] ULTRA test strip 1 each 2 (two) times daily. 10/05/20   [provider]      Allergies    Augmentin [amoxicillin-pot clavulanate], Iodine, and Tape    Review of Systems   Review of Systems  Constitutional:  Negative for chills and fever.  Respiratory:  Negative for shortness of breath.   Cardiovascular:  Negative for chest pain.  Gastrointestinal:  Negative for abdominal pain and vomiting.  Genitourinary:  Negative for flank pain.  Musculoskeletal:  Negative for back pain.  Skin:        Cellulitis.   Neurological:  Negative for headaches.    Physical Exam Updated Vital Signs BP 118/84   Pulse 94  Temp 98.4 F (36.9 C) (Oral)   Resp 17   Ht 1.651 m (5\' 5" )   Wt 94.3 kg   SpO2 96%   BMI 34.61 kg/m  Physical Exam Vitals and nursing note reviewed.  Constitutional:      Appearance: Normal appearance. He is well-developed.  HENT:     Head: Atraumatic.     Nose: Nose normal.     Mouth/Throat:     Mouth: Mucous membranes are moist.  Eyes:     General: No scleral icterus.    Conjunctiva/sclera: Conjunctivae normal.  Neck:     Trachea: No tracheal deviation.  Cardiovascular:     Rate and Rhythm: Normal rate and regular rhythm.     Pulses: Normal pulses.     Heart sounds: Normal heart sounds. No murmur heard.    No  friction rub. No gallop.  Pulmonary:     Effort: Pulmonary effort is normal. No accessory muscle usage or respiratory distress.     Breath sounds: Normal breath sounds.  Abdominal:     General: There is no distension.     Tenderness: There is no abdominal tenderness.  Genitourinary:    Comments: No cva tenderness. Musculoskeletal:     Cervical back: Neck supple.     Comments: Left lower leg cellulitis extending up to thigh proximally and foot distally. No crepitus. No necrotic or gangrenous tissue.  Pt with ~ 4 cm blister to posterior aspect right lower leg above heel, ?pressure injury.  Mild-mod swelling LLE. Compartments of leg are soft, not  tense, distal pulses palp.   Skin:    General: Skin is warm and dry.     Findings: No rash.  Neurological:     Mental Status: He is alert.     Comments: Alert, speech clear. LLE nvi, w intact motor/sens fxn.   Psychiatric:        Mood and Affect: Mood normal.     ED Results / Procedures / Treatments   Labs (all labs ordered are listed, but only abnormal results are displayed) Results for orders placed or performed during the hospital encounter of 08/28/22  Lactic acid, plasma  Result Value Ref Range   Lactic Acid, Venous 2.3 (HH) 0.5 - 1.9 mmol/L  Lactic acid, plasma  Result Value Ref Range   Lactic Acid, Venous 1.5 0.5 - 1.9 mmol/L  Comprehensive metabolic panel  Result Value Ref Range   Sodium 134 (L) 135 - 145 mmol/L   Potassium 4.3 3.5 - 5.1 mmol/L   Chloride 103 98 - 111 mmol/L   CO2 20 (L) 22 - 32 mmol/L   Glucose, Bld 233 (H) 70 - 99 mg/dL   BUN 28 (H) 8 - 23 mg/dL   Creatinine, Ser 10/28/22 0.61 - 1.24 mg/dL   Calcium 9.1 8.9 - 1.63 mg/dL   Total Protein 7.2 6.5 - 8.1 g/dL   Albumin 3.9 3.5 - 5.0 g/dL   AST 16 15 - 41 U/L   ALT 19 0 - 44 U/L   Alkaline Phosphatase 67 38 - 126 U/L   Total Bilirubin 0.7 0.3 - 1.2 mg/dL   GFR, Estimated 84.6 >65 mL/min   Anion gap 11 5 - 15  CBC with Differential  Result Value Ref Range    WBC 10.2 4.0 - 10.5 K/uL   RBC 5.10 4.22 - 5.81 MIL/uL   Hemoglobin 14.9 13.0 - 17.0 g/dL   HCT >99 35.7 - 01.7 %   MCV 88.0 80.0 - 100.0 fL  MCH 29.2 26.0 - 34.0 pg   MCHC 33.2 30.0 - 36.0 g/dL   RDW 64.3 32.9 - 51.8 %   Platelets 313 150 - 400 K/uL   nRBC 0.0 0.0 - 0.2 %   Neutrophils Relative % 63 %   Neutro Abs 6.6 1.7 - 7.7 K/uL   Lymphocytes Relative 25 %   Lymphs Abs 2.5 0.7 - 4.0 K/uL   Monocytes Relative 8 %   Monocytes Absolute 0.8 0.1 - 1.0 K/uL   Eosinophils Relative 2 %   Eosinophils Absolute 0.2 0.0 - 0.5 K/uL   Basophils Relative 1 %   Basophils Absolute 0.1 0.0 - 0.1 K/uL   Immature Granulocytes 1 %   Abs Immature Granulocytes 0.05 0.00 - 0.07 K/uL      EKG None  Radiology No results found.  Procedures Procedures    Medications Ordered in ED Medications  piperacillin-tazobactam (ZOSYN) IVPB 3.375 g (has no administration in time range)    ED Course/ Medical Decision Making/ A&P                           Medical Decision Making Problems Addressed: Cellulitis of leg, left: acute illness or injury with systemic symptoms that poses a threat to life or bodily functions Elevated lactic acid level: acute illness or injury Hyperglycemia: acute illness or injury Insulin dependent type 2 diabetes mellitus (HCC): chronic illness or injury with exacerbation, progression, or side effects of treatment that poses a threat to life or bodily functions  Amount and/or Complexity of Data Reviewed External Data Reviewed: labs and notes. Labs: ordered. Decision-making details documented in ED Course. Discussion of management or test interpretation with external provider(s): hospitalist  Risk Prescription drug management. Parenteral controlled substances. Decision regarding hospitalization.   Iv ns. Continuous pulse ox and cardiac monitoring. Labs ordered/sent. Imaging ordered.   Reviewed nursing notes and prior charts for additional history. External  reports reviewed. Additional history from:  Cardiac monitor: sinus rhythm, rate 94.   Labs reviewed/interpreted by me - wbc 10. Lactate 2.3. iv fluids. Repeat 1.5.   Zosyn iv. Dilaudid .5mg  iv.   Hospitalist consulted for admission.           Final Clinical Impression(s) / ED Diagnoses Final diagnoses:  None    Rx / DC Orders ED Discharge Orders     None         , MD 08/28/22 2231

## 2022-08-28 NOTE — H&P (Signed)
History and Physical    Patient: Kurt [name] DOB: February 23, 1951 DOA: 08/28/2022 DOS: the patient was seen and examined on 08/29/2022 PCP: [facility]  Patient coming from: Home  Chief Complaint:  Chief Complaint  Patient presents with   Cellulitis   HPI: Kurt [name] is a 71 y.o. male with medical history significant of T2DM, hypertension, hyperlipidemia who presents to the emergency department due to 2-week onset of left leg cellulitis, he states that he was placed on clindamycin (300 mg twice daily) which he has used for 10 days, but noted worsening in the redness on his leg due to spread to was proximal left thigh and towards the foot.  He denies chest pain, shortness of breath, fever, chills, nausea, vomiting  ED Course:  In the emergency department, he was hemodynamically stable.  Work-up in the ED showed normal CBC and BMP except for sodium 134, bicarb 20, glucose 233, BUN 28.  Lactic acid 2.3 > 1.5. Patient was empirically started on Zosyn, Dilaudid was given and IV hydration was provided.  Hospitalist was asked to admit patient for further evaluation and management.  Review of Systems: Review of systems as noted in the HPI. All other systems reviewed and are negative.   Past Medical History:  Diagnosis Date   Arthritis    Bronchitis    Bronchitis    Cataract    OU   Concussion    late 1990's  after a fall   GERD (gastroesophageal reflux disease)    History of kidney stones    Hypertension    Hypertensive retinopathy    OU   Osteogenesis imperfecta    PONV (postoperative nausea and vomiting)    pt has post polio syndrome   Post-polio syndrome    Type 2 diabetes mellitus (HCC) 12/25/2018   Past Surgical History:  Procedure Laterality Date   ANKLE FRACTURE SURGERY Bilateral    arm surgery Right    nerve surgery   COLONOSCOPY     ELBOW FRACTURE SURGERY Left    EYE MUSCLE SURGERY Left    LEG SURGERY Left    femur  fracture with rod   REVERSE SHOULDER ARTHROPLASTY Right 11/10/2019   Procedure: RIGHT REVERSE SHOULDER ARTHROPLASTY;  Surgeon: Cammy Copa, MD;  Location: [facility] OR;  Service: Orthopedics;  Laterality: Right;   TONSILLECTOMY      Social History:  reports that he has never smoked. He has never used smokeless tobacco. He reports current alcohol use. He reports that he does not use drugs.   Allergies  Allergen Reactions   Augmentin [Amoxicillin-Pot Clavulanate] Nausea And Vomiting   Iodine Hives   Tape Rash    Paper     Family History  Problem Relation Age of Onset   Hypertension Mother    Diabetes Brother      Prior to Admission medications   Medication Sig Start Date End Date Taking? Authorizing Provider  acetaminophen (TYLENOL) 650 MG CR tablet Take 1,300 mg by mouth every 8 (eight) hours as needed for pain.   Yes [provider]  aspirin 81 MG chewable tablet Chew 1 tablet (81 mg total) by mouth daily. 11/25/19  Yes Magnant, Charles L, PA-C  clindamycin (CLEOCIN) 300 MG capsule Take 300 mg by mouth 2 times daily at 12 noon and 4 pm. Start date 08/21/22 for 10 day supply   Yes [provider]  docusate sodium (COLACE) 100 MG capsule Take 100 mg by mouth 2 (  two) times daily.   Yes [provider]  glipiZIDE (GLUCOTROL XL) 10 MG 24 hr tablet Take 10 mg by mouth at bedtime.    Yes [provider]  HYDROcodone-acetaminophen (NORCO) 7.5-325 MG tablet Take 1 tablet by mouth daily as needed for moderate pain. 06/15/21  Yes [provider]  ketoconazole (NIZORAL) 2 % cream Apply 1 Application topically daily.   Yes [provider]  LANTUS SOLOSTAR 100 UNIT/ML Solostar Pen Inject 34 Units into the skin at bedtime. 12/19/18  Yes [provider]  lisinopril (ZESTRIL) 20 MG tablet Take 20 mg by mouth daily.   Yes [provider]  methocarbamol (ROBAXIN) 500 MG tablet Take 500 mg by mouth every 8 (eight) hours as needed for  muscle spasms.   Yes [provider]  Multiple Vitamin (MULTIVITAMIN) tablet Take 1 tablet by mouth daily.   Yes [provider]  Omega-3 Fatty Acids (FISH OIL) 1000 MG CAPS Take by mouth.   Yes [provider]  simvastatin (ZOCOR) 20 MG tablet Take 20 mg by mouth every evening.   Yes [provider]  SitaGLIPtin-MetFORMIN HCl [phone] MG TB24 Take 1 tablet by mouth at bedtime.   Yes [provider]  zinc gluconate 50 MG tablet Take 50 mg by mouth daily.   Yes [provider]  B-D ULTRAFINE III SHORT PEN 31G X 8 MM MISC Inject into the skin. 09/28/21   [provider]  [facility] ULTRA test strip 1 each 2 (two) times daily. 10/05/20   [provider]    Physical Exam: BP 127/85 (BP Location: Right Arm)   Pulse 91   Temp 97.8 F (36.6 C) (Oral)   Resp 18   Ht 5\' 5"  (1.651 m)   Wt 94.3 kg   SpO2 100%   BMI 34.61 kg/m   General: 71 y.o. year-old male well developed well nourished in no acute distress.  Alert and oriented x3. HEENT: NCAT, EOMI, dry mucous membrane Neck: Supple, trachea medial Cardiovascular: Regular rate and rhythm with no rubs or gallops.  No thyromegaly or JVD noted.  No lower extremity edema. 2/4 pulses in all 4 extremities. Respiratory: Clear to auscultation with no wheezes or rales. Good inspiratory effort. Abdomen: Soft, nontender nondistended with normal bowel sounds x4 quadrants. Muskuloskeletal: Left lower extremity erythematous rash with bilateral spread superiorly (towards the thigh) and inferiorly (towards the foot).   Neuro: CN II-XII intact, sensation, reflexes intact Skin: No ulcerative lesions noted or rashes Psychiatry: Judgement and insight appear normal. Mood is appropriate for condition and setting           Labs on Admission:  Basic Metabolic Panel: Recent Labs  Lab 08/28/22 1803  NA 134*  K 4.3  CL 103  CO2 20*  GLUCOSE 233*  BUN 28*  CREATININE 0.92  CALCIUM 9.1    Liver Function Tests: Recent Labs  Lab 08/28/22 1803  AST 16  ALT 19  ALKPHOS 67  BILITOT 0.7  PROT 7.2  ALBUMIN 3.9   No results for input(s): "LIPASE", "AMYLASE" in the last 168 hours. No results for input(s): "AMMONIA" in the last 168 hours. CBC: Recent Labs  Lab 08/28/22 1803  WBC 10.2  NEUTROABS 6.6  HGB 14.9  HCT 44.9  MCV 88.0  PLT 313   Cardiac Enzymes: No results for input(s): "CKTOTAL", "CKMB", "CKMBINDEX", "TROPONINI" in the last 168 hours.  BNP (last 3 results) No results for input(s): "BNP" in the last 8760 hours.  ProBNP (  last 3 results) No results for input(s): "PROBNP" in the last 8760 hours.  CBG: No results for input(s): "GLUCAP" in the last 168 hours.  Radiological Exams on Admission: No results found.  EKG: I independently viewed the EKG done and my findings are as followed: EKG was not done in the ED  Assessment/Plan Present on Admission:  Left leg cellulitis  Principal Problem:   Left leg cellulitis Active Problems:   Uncontrolled type 2 diabetes mellitus with hyperglycemia, with long-term current use of insulin (HCC)   Essential hypertension   Mixed hyperlipidemia   Lactic acidosis   Dehydration  Left leg cellulitis Patient was started on IV Zosyn, we shall continue with IV vancomycin and cefepime Continue Tylenol as needed  Type 2 diabetes mellitus with uncontrolled hyperglycemia Globin A1c about 1 year ago was 7.6, hemoglobin A1c will be checked Continue Semglee 10 units nightly and adjust dose accordingly Continue ISS and hypoglycemia protocol Glipizide and metformin will be held at this time  Lactic acidosis-resolved Lactic acid 2.3 > 1.5  Dehydration Continue IV hydration  Essential hypertension (controlled) Continue lisinopril  Mixed hyperlipidemia Continue Zocor  DVT prophylaxis: Lovenox  Code Status: Full code  Consults: None  Family Communication: None at bedside  Severity of Illness: The  appropriate patient status for this patient is INPATIENT. Inpatient status is judged to be reasonable and necessary in order to provide the required intensity of service to ensure the patient's safety. The patient's presenting symptoms, physical exam findings, and initial radiographic and laboratory data in the context of their chronic comorbidities is felt to place them at high risk for further clinical deterioration. Furthermore, it is not anticipated that the patient will be medically stable for discharge from the hospital within 2 midnights of admission.   * I certify that at the point of admission it is my clinical judgment that the patient will require inpatient hospital care spanning beyond 2 midnights from the point of admission due to high intensity of service, high risk for further deterioration and high frequency of surveillance required.*  Author: Frankey Shown, DO 08/29/2022 3:35 AM  For on call review [email or web address].

## 2022-08-29 DIAGNOSIS — E872 Acidosis, unspecified: Secondary | ICD-10-CM

## 2022-08-29 DIAGNOSIS — R7989 Other specified abnormal findings of blood chemistry: Secondary | ICD-10-CM

## 2022-08-29 DIAGNOSIS — E860 Dehydration: Secondary | ICD-10-CM | POA: Diagnosis not present

## 2022-08-29 DIAGNOSIS — R739 Hyperglycemia, unspecified: Secondary | ICD-10-CM

## 2022-08-29 DIAGNOSIS — I10 Essential (primary) hypertension: Secondary | ICD-10-CM | POA: Diagnosis not present

## 2022-08-29 DIAGNOSIS — E669 Obesity, unspecified: Secondary | ICD-10-CM

## 2022-08-29 DIAGNOSIS — L03116 Cellulitis of left lower limb: Secondary | ICD-10-CM | POA: Diagnosis not present

## 2022-08-29 LAB — COMPREHENSIVE METABOLIC PANEL
ALT: 16 U/L (ref 0–44)
AST: 14 U/L — ABNORMAL LOW (ref 15–41)
Albumin: 3.2 g/dL — ABNORMAL LOW (ref 3.5–5.0)
Alkaline Phosphatase: 64 U/L (ref 38–126)
Anion gap: 3 — ABNORMAL LOW (ref 5–15)
BUN: 24 mg/dL — ABNORMAL HIGH (ref 8–23)
CO2: 22 mmol/L (ref 22–32)
Calcium: 8 mg/dL — ABNORMAL LOW (ref 8.9–10.3)
Chloride: 107 mmol/L (ref 98–111)
Creatinine, Ser: 0.88 mg/dL (ref 0.61–1.24)
GFR, Estimated: >60 mL/min (ref >60)
Glucose, Bld: 188 mg/dL — ABNORMAL HIGH (ref 70–99)
Potassium: 4.1 mmol/L (ref 3.5–5.1)
Sodium: 132 mmol/L — ABNORMAL LOW (ref 135–145)
Total Bilirubin: 0.6 mg/dL (ref 0.3–1.2)
Total Protein: 6 g/dL — ABNORMAL LOW (ref 6.5–8.1)

## 2022-08-29 LAB — CBC
HCT: 39.5 % (ref 39.0–52.0)
Hemoglobin: 12.9 g/dL — ABNORMAL LOW (ref 13.0–17.0)
MCH: 29.1 pg (ref 26.0–34.0)
MCHC: 32.7 g/dL (ref 30.0–36.0)
MCV: 89 fL (ref 80.0–100.0)
Platelets: 285 10*3/uL (ref 150–400)
RBC: 4.44 MIL/uL (ref 4.22–5.81)
RDW: 14 % (ref 11.5–15.5)
WBC: 11.6 10*3/uL — ABNORMAL HIGH (ref 4.0–10.5)
nRBC: 0 % (ref 0.0–0.2)

## 2022-08-29 LAB — GLUCOSE, CAPILLARY
Glucose-Capillary: 176 mg/dL — ABNORMAL HIGH (ref 70–99)
Glucose-Capillary: 254 mg/dL — ABNORMAL HIGH (ref 70–99)
Glucose-Capillary: 147 mg/dL — ABNORMAL HIGH (ref 70–99)
Glucose-Capillary: 174 mg/dL — ABNORMAL HIGH (ref 70–99)

## 2022-08-29 LAB — APTT: aPTT: 27 seconds (ref 24–36)

## 2022-08-29 LAB — PHOSPHORUS: Phosphorus: 3.3 mg/dL (ref 2.5–4.6)

## 2022-08-29 LAB — HEMOGLOBIN A1C
Hgb A1c MFr Bld: 7.5 % — ABNORMAL HIGH (ref 4.8–5.6)
Mean Plasma Glucose: 168.55 mg/dL

## 2022-08-29 LAB — MAGNESIUM: Magnesium: 1.9 mg/dL (ref 1.7–2.4)

## 2022-08-29 MED ORDER — ACETAMINOPHEN 650 MG RE SUPP: 650.0000 mg | Freq: Four times a day (QID) | RECTAL | Status: DC | PRN

## 2022-08-29 MED ORDER — VANCOMYCIN HCL 1250 MG/250ML IV SOLN
1250.0000 mg | INTRAVENOUS | Status: DC
  Administered 2022-08-29 – 2022-09-01 (×4): 1250 mg via INTRAVENOUS
  Filled 2022-08-29 (×4): qty 250

## 2022-08-29 MED ORDER — INSULIN ASPART 100 UNIT/ML IJ SOLN
0.0000 [IU] | Freq: Three times a day (TID) | INTRAMUSCULAR | Status: DC
  Administered 2022-08-29: 8 [IU] via SUBCUTANEOUS
  Administered 2022-08-29: 2 [IU] via SUBCUTANEOUS
  Administered 2022-08-29: 3 [IU] via SUBCUTANEOUS
  Administered 2022-08-30: 8 [IU] via SUBCUTANEOUS
  Administered 2022-08-30 (×2): 3 [IU] via SUBCUTANEOUS
  Administered 2022-08-31: 11 [IU] via SUBCUTANEOUS
  Administered 2022-08-31: 3 [IU] via SUBCUTANEOUS
  Administered 2022-08-31: 2 [IU] via SUBCUTANEOUS
  Administered 2022-09-01: 11 [IU] via SUBCUTANEOUS
  Administered 2022-09-01: 5 [IU] via SUBCUTANEOUS

## 2022-08-29 MED ORDER — SODIUM CHLORIDE 0.9 % IV SOLN
2.0000 g | Freq: Three times a day (TID) | INTRAVENOUS | Status: DC
  Administered 2022-08-29 – 2022-09-01 (×10): 2 g via INTRAVENOUS
  Filled 2022-08-29 (×10): qty 12.5

## 2022-08-29 MED ORDER — SIMVASTATIN 20 MG PO TABS
20.0000 mg | ORAL_TABLET | Freq: Every evening | ORAL | Status: DC
  Administered 2022-08-29 – 2022-08-31 (×3): 20 mg via ORAL
  Filled 2022-08-29 (×3): qty 1

## 2022-08-29 MED ORDER — LISINOPRIL 10 MG PO TABS
20.0000 mg | ORAL_TABLET | Freq: Every day | ORAL | Status: DC
  Administered 2022-08-29 – 2022-09-01 (×4): 20 mg via ORAL
  Filled 2022-08-29 (×4): qty 2

## 2022-08-29 MED ORDER — ONDANSETRON HCL 4 MG PO TABS: 4.0000 mg | ORAL_TABLET | Freq: Four times a day (QID) | ORAL | Status: DC | PRN

## 2022-08-29 MED ORDER — SODIUM CHLORIDE 0.9 % IV SOLN
2.0000 g | Freq: Once | INTRAVENOUS | Status: AC
  Administered 2022-08-29: 2 g via INTRAVENOUS
  Filled 2022-08-29: qty 12.5

## 2022-08-29 MED ORDER — VANCOMYCIN HCL IN DEXTROSE 1-5 GM/200ML-% IV SOLN: 1000.0000 mg | Freq: Once | INTRAVENOUS | Status: DC

## 2022-08-29 MED ORDER — INSULIN ASPART 100 UNIT/ML IJ SOLN
0.0000 [IU] | Freq: Every day | INTRAMUSCULAR | Status: DC
  Administered 2022-08-31: 4 [IU] via SUBCUTANEOUS

## 2022-08-29 MED ORDER — ENOXAPARIN SODIUM 40 MG/0.4ML IJ SOSY
40.0000 mg | PREFILLED_SYRINGE | INTRAMUSCULAR | Status: DC
  Administered 2022-08-29 – 2022-09-01 (×4): 40 mg via SUBCUTANEOUS
  Filled 2022-08-29 (×4): qty 0.4

## 2022-08-29 MED ORDER — SODIUM CHLORIDE 0.9 % IV SOLN: INTRAVENOUS | Status: AC

## 2022-08-29 MED ORDER — ACETAMINOPHEN 325 MG PO TABS
650.0000 mg | ORAL_TABLET | Freq: Four times a day (QID) | ORAL | Status: DC | PRN
  Administered 2022-08-29 – 2022-08-30 (×3): 650 mg via ORAL
  Filled 2022-08-29 (×3): qty 2

## 2022-08-29 MED ORDER — INSULIN GLARGINE-YFGN 100 UNIT/ML ~~LOC~~ SOLN
10.0000 [IU] | Freq: Every day | SUBCUTANEOUS | Status: DC
  Administered 2022-08-29 – 2022-08-31 (×3): 10 [IU] via SUBCUTANEOUS
  Filled 2022-08-29 (×4): qty 0.1

## 2022-08-29 MED ORDER — ONDANSETRON HCL 4 MG/2ML IJ SOLN: 4.0000 mg | Freq: Four times a day (QID) | INTRAMUSCULAR | Status: DC | PRN

## 2022-08-29 NOTE — Progress Notes (Signed)
Pharmacy Antibiotic Note  Kurt [name] is a 71 y.o. male admitted on 08/28/2022 with cellulitis which has worsening on clindamycin PTA. Pharmacy has been consulted for vancomycin and cefepime dosing. Cr <1.   Plan: Cefepime 2g IV q8h Vancomycin 1250mg  IV q24h - est AUC 468 Follow Cr, LOT, vancomycin levels PRN  Height: 5\' 5"  (165.1 cm) Weight: 94.3 kg (208 lb) IBW/kg (Calculated) : 61.5  Temp (24hrs), Avg:98.2 F (36.8 C), Min:97.8 F (36.6 C), Max:98.4 F (36.9 C)  Recent Labs  Lab 08/28/22 1803 08/28/22 1943  WBC 10.2  --   CREATININE 0.92  --   LATICACIDVEN 2.3* 1.5    Estimated Creatinine Clearance: 76.6 mL/min (by C-G formula based on SCr of 0.92 mg/dL).    Allergies  Allergen Reactions   Augmentin [Amoxicillin-Pot Clavulanate] Nausea And Vomiting   Iodine Hives   Tape Rash    Paper     10/28/22, PharmD, BCPS, [facility] Clinical Pharmacist Please check AMION for all [facility] Pharmacy numbers 08/29/2022

## 2022-08-29 NOTE — Progress Notes (Signed)
Progress Note   Patient: Kurt [name] EHM:[id number] DOB: [birth year] DOA: 08/28/2022     1 DOS: the patient was seen and examined on 08/29/2022   Brief hospital mission course: As per H&P written by Dr. Thomes Dinning on 08/28/2022 Kurt [name] is a 71 y.o. male with medical history significant of T2DM, hypertension, hyperlipidemia who presents to the emergency department due to 2-week onset of left leg cellulitis, he states that he was placed on clindamycin (300 mg twice daily) which he has used for 10 days, but noted worsening in the redness on his leg due to spread to was proximal left thigh and towards the foot.  He denies chest pain, shortness of breath, fever, chills, nausea, vomiting   ED Course:  In the emergency department, he was hemodynamically stable.  Work-up in the ED showed normal CBC and BMP except for sodium 134, bicarb 20, glucose 233, BUN 28.  Lactic acid 2.3 > 1.5. Patient was empirically started on Zosyn, Dilaudid was given and IV hydration was provided.  Hospitalist was asked to admit patient for further evaluation and management.  Assessment and Plan: Left leg cellulitis -Patient has failed outpatient antibiotic therapy (1 week of clindamycin) -Underlying history of diabetes and venous insufficiency -Some skin breakdown/abrasion appreciated -No frank abscess seen -Continue broad-spectrum antibiotics -Keep leg elevated and provide supportive care/analgesic.  Type 2 diabetes mellitus with uncontrolled hyperglycemia -Updated A1c 7.5 -Elevated CBGs in the setting of infection -Modified carbohydrate diet discussed with patient -Holding oral hypoglycemic agents while inpatient -Continue sliding scale insulin and follow CBGs.  Lactic acidosis -In the setting of infection/dehydration -At time of admission lactic acid 2.3; after fluid resuscitation 1.5 -Continue IV antibiotics and supportive care.  Dehydration -Continue maintaining adequate hydration -Continue IV  fluids  Essential hypertension -Heart healthy diet discussed with patient -Continue lisinopril.  Mixed hyperlipidemia -Continue the use of Zocor --Diet discussed with patient.  Class I obesity -Body mass index is 34.61 kg/m. -Low calorie diet, portion control and increase physical activity discussed with patient.  Subjective:  -Afebrile, no chest pain, no nausea, no vomiting.  Reports feeling better.  Still having some pain, swelling and erythematous changes appreciated on his left lower extremity.  Physical Exam: Vitals:   08/28/22 2300 08/28/22 2356 08/29/22 0507 08/29/22 1438  BP: 118/75 127/85 (!) 101/53 115/69  Pulse: 86 91 91 88  Resp: 16 18 18 18   Temp:  97.8 F (36.6 C) 98 F (36.7 C) 98.4 F (36.9 C)  TempSrc:  Oral Oral Oral  SpO2: 97% 100% 97% 100%  Weight:      Height:       General exam: Alert, awake, oriented x 3; currently afebrile; no overnight events. Respiratory system: Clear to auscultation. Respiratory effort normal.  No using accessory muscles. Cardiovascular system:RRR. No rubs or gallops. Gastrointestinal system: Abdomen is obese, nondistended, soft and nontender. No organomegaly or masses felt. Normal bowel sounds heard. Central nervous system: Alert and oriented. No focal neurological deficits. Extremities: No cyanosis or clubbing; left lower extremity swollen, with erythematous changes spreading up to his thighs.  Patient expressed pain. Skin: No petechiae. Psychiatry: Judgement and insight appear normal. Mood & affect appropriate.   Data Reviewed: A1c 7.5 CBC: WBCs 11.6, hemoglobin 12.9, platelets count 20 5K Comprehensive metabolic panel: Sodium 132, potassium 4.1, chloride 107, bicarb 24, creatinine 0.88; normal LFTs.  Family Communication: No family at bedside.  Disposition: Status is: Inpatient Remains inpatient appropriate because: Requiring IV antibiotics.   Planned Discharge Destination: Home  Author: Vassie Loll,  MD 08/29/2022 6:55 PM  For on call review [email or web address].

## 2022-08-29 NOTE — Progress Notes (Signed)
  Transition of Care [facility]) Screening Note   Patient Details  Name: BRENTIN SHIN Date of Birth: Dec 03, 1951   Transition of Care [facility]) CM/SW Contact:    Annice Needy, LCSW Phone Number: 08/29/2022, 12:53 PM    Transition of Care Department [facility]) has reviewed patient and no TOC needs have been identified at this time. We will continue to monitor patient advancement through interdisciplinary progression rounds. If new patient transition needs arise, please place a TOC consult.

## 2022-08-30 DIAGNOSIS — R7989 Other specified abnormal findings of blood chemistry: Secondary | ICD-10-CM | POA: Diagnosis not present

## 2022-08-30 DIAGNOSIS — L03116 Cellulitis of left lower limb: Secondary | ICD-10-CM | POA: Diagnosis not present

## 2022-08-30 DIAGNOSIS — I10 Essential (primary) hypertension: Secondary | ICD-10-CM | POA: Diagnosis not present

## 2022-08-30 DIAGNOSIS — E860 Dehydration: Secondary | ICD-10-CM | POA: Diagnosis not present

## 2022-08-30 LAB — GLUCOSE, CAPILLARY
Glucose-Capillary: 165 mg/dL — ABNORMAL HIGH (ref 70–99)
Glucose-Capillary: 274 mg/dL — ABNORMAL HIGH (ref 70–99)
Glucose-Capillary: 158 mg/dL — ABNORMAL HIGH (ref 70–99)
Glucose-Capillary: 177 mg/dL — ABNORMAL HIGH (ref 70–99)

## 2022-08-30 NOTE — Progress Notes (Signed)
Progress Note   Patient: Kurt [name]:[id number] DOB: October 21, 1951 DOA: 08/28/2022     2 DOS: the patient was seen and examined on 08/30/2022   Brief hospital mission course: As per H&P written by Dr. Thomes Dinning on 08/28/2022 Kurt [name] is a 71 y.o. male with medical history significant of T2DM, hypertension, hyperlipidemia who presents to the emergency department due to 2-week onset of left leg cellulitis, he states that he was placed on clindamycin (300 mg twice daily) which he has used for 10 days, but noted worsening in the redness on his leg due to spread to was proximal left thigh and towards the foot.  He denies chest pain, shortness of breath, fever, chills, nausea, vomiting   ED Course:  In the emergency department, he was hemodynamically stable.  Work-up in the ED showed normal CBC and BMP except for sodium 134, bicarb 20, glucose 233, BUN 28.  Lactic acid 2.3 > 1.5. Patient was empirically started on Zosyn, Dilaudid was given and IV hydration was provided.  Hospitalist was asked to admit patient for further evaluation and management.  Assessment and Plan: Left leg cellulitis -Patient has failed outpatient antibiotic therapy (1 week of clindamycin) -Underlying history of diabetes and venous insufficiency -Some skin breakdown/abrasion appreciated; no active drainage. -Right lower extremity demonstrating some erythema and posterior aspect with big blister/hematoma; Currently intact. -No frank abscess seen -Continue current IV antibiotics -Keep leg elevated and provide supportive care/analgesic. -Follow clinical response.  Type 2 diabetes mellitus with uncontrolled hyperglycemia -Updated A1c 7.5 -Elevated CBGs in the setting of infection. -Patient has been encourage to follow modified carbohydrate diet -Continue to follow CBGs and adjust hypoglycemic agents as needed.  Lactic acidosis -In the setting of infection/dehydration -At time of admission lactic acid 2.3; after  fluid resuscitation 1.5 -Continue current IV antibiotics and follow clinical response.  Dehydration -Continue to maintain adequate hydration.  Essential hypertension -Continue current antihypertensive agents -Heart healthy diet discussed with patient.  Mixed hyperlipidemia -Continue the use of Zocor --Diet discussed with patient. -Continue outpatient follow-up of patient's LFTs and lipid panel.  Class I obesity -Body mass index is 34.61 kg/m. --Low calorie diet, portion control and increase physical activity discussed with patient.  Subjective:  No fever, no chest pain, no nausea, no vomiting.  Reports he continue improving; left lower extremity less painful, less swollen and is starting to demonstrate improvement in erythematous changes.  Physical Exam: Vitals:   08/29/22 2034 08/29/22 2143 08/30/22 0536 08/30/22 1205  BP: 101/66 (!) 105/55 110/71 117/81  Pulse: 83 76 81 84  Resp: 19 20 19 20   Temp: 98.4 F (36.9 C) 97.7 F (36.5 C) 98.2 F (36.8 C) 98 F (36.7 C)  TempSrc: Oral Oral Oral Oral  SpO2: 98% 100% 97% 100%  Weight:      Height:       General exam: Alert, awake, oriented x 3; no chest pain, no shortness of breath, no nausea vomiting.  Reports less pain on his left lower extremity. Respiratory system: Clear to auscultation. Respiratory effort normal.  Good saturation on room air. Cardiovascular system:RRR. No murmurs, rubs, gallops.  No JVD. Gastrointestinal system: Abdomen is obese, nondistended, soft and nontender. No organomegaly or masses felt. Normal bowel sounds heard. Central nervous system: Alert and oriented. No focal neurological deficits. Extremities: No gnosis or clubbing. Skin: Improvement appreciated in his left lower extremity erythematous changes; there is still swelling and tenderness on palpation appreciated.  Right lower extremity with some erythema and positive  big blister/hematoma in the posterior aspect of his leg. Psychiatry: Judgement  and insight appear normal. Mood & affect appropriate.   Data Reviewed: A1c 7.5 CBC: WBCs 11.6, hemoglobin 12.9, platelets count 20 5K Comprehensive metabolic panel: Sodium 132, potassium 4.1, chloride 107, bicarb 24, creatinine 0.88; normal LFTs.  New labs to be done tomorrow; today CBGs in the 150s to 200 range.  Family Communication: No family at bedside.  Disposition: Status is: Inpatient Remains inpatient appropriate because: Requiring IV antibiotics.   Planned Discharge Destination: Home   Author: Vassie Loll, MD 08/30/2022 4:20 PM  For on call review [email or web address].

## 2022-08-31 DIAGNOSIS — E119 Type 2 diabetes mellitus without complications: Secondary | ICD-10-CM

## 2022-08-31 DIAGNOSIS — I10 Essential (primary) hypertension: Secondary | ICD-10-CM | POA: Diagnosis not present

## 2022-08-31 DIAGNOSIS — L03116 Cellulitis of left lower limb: Secondary | ICD-10-CM | POA: Diagnosis not present

## 2022-08-31 DIAGNOSIS — R7989 Other specified abnormal findings of blood chemistry: Secondary | ICD-10-CM | POA: Diagnosis not present

## 2022-08-31 DIAGNOSIS — E860 Dehydration: Secondary | ICD-10-CM | POA: Diagnosis not present

## 2022-08-31 LAB — CBC
HCT: 42.5 % (ref 39.0–52.0)
Hemoglobin: 13.7 g/dL (ref 13.0–17.0)
MCH: 28.8 pg (ref 26.0–34.0)
MCHC: 32.2 g/dL (ref 30.0–36.0)
MCV: 89.3 fL (ref 80.0–100.0)
Platelets: 283 10*3/uL (ref 150–400)
RBC: 4.76 MIL/uL (ref 4.22–5.81)
RDW: 13.8 % (ref 11.5–15.5)
WBC: 8.9 10*3/uL (ref 4.0–10.5)
nRBC: 0 % (ref 0.0–0.2)

## 2022-08-31 LAB — BASIC METABOLIC PANEL
Anion gap: 7 (ref 5–15)
BUN: 16 mg/dL (ref 8–23)
CO2: 21 mmol/L — ABNORMAL LOW (ref 22–32)
Calcium: 8.6 mg/dL — ABNORMAL LOW (ref 8.9–10.3)
Chloride: 108 mmol/L (ref 98–111)
Creatinine, Ser: 0.79 mg/dL (ref 0.61–1.24)
GFR, Estimated: >60 mL/min (ref >60)
Glucose, Bld: 180 mg/dL — ABNORMAL HIGH (ref 70–99)
Potassium: 4.2 mmol/L (ref 3.5–5.1)
Sodium: 136 mmol/L (ref 135–145)

## 2022-08-31 LAB — GLUCOSE, CAPILLARY
Glucose-Capillary: 174 mg/dL — ABNORMAL HIGH (ref 70–99)
Glucose-Capillary: 307 mg/dL — ABNORMAL HIGH (ref 70–99)
Glucose-Capillary: 153 mg/dL — ABNORMAL HIGH (ref 70–99)
Glucose-Capillary: 319 mg/dL — ABNORMAL HIGH (ref 70–99)

## 2022-08-31 NOTE — Progress Notes (Signed)
Progress Note   Patient: Kurt [name] IDP:[id number] DOB: [birth year] DOA: 08/28/2022     3 DOS: the patient was seen and examined on 08/31/2022   Brief hospital mission course: As per H&P written by Dr. Thomes Dinning on 08/28/2022 [name] [name] is a 71 y.o. male with medical history significant of T2DM, hypertension, hyperlipidemia who presents to the emergency department due to 2-week onset of left leg cellulitis, he states that he was placed on clindamycin (300 mg twice daily) which he has used for 10 days, but noted worsening in the redness on his leg due to spread to was proximal left thigh and towards the foot.  He denies chest pain, shortness of breath, fever, chills, nausea, vomiting   ED Course:  In the emergency department, he was hemodynamically stable.  Work-up in the ED showed normal CBC and BMP except for sodium 134, bicarb 20, glucose 233, BUN 28.  Lactic acid 2.3 > 1.5. Patient was empirically started on Zosyn, Dilaudid was given and IV hydration was provided.  Hospitalist was asked to admit patient for further evaluation and management.  Assessment and Plan: Left leg cellulitis -Patient has failed outpatient antibiotic therapy (1 week of clindamycin) -Underlying history of diabetes and venous insufficiency -Some skin breakdown/abrasion appreciated; no active drainage. -Right lower extremity demonstrating some erythema and posterior aspect with big blister/hematoma; Currently intact. -No frank abscess seen -Continue current IV antibiotics for another 24 hours; with intention to transition to oral route and discharged home with outpatient follow-up. -Keep leg elevated and provide supportive care/analgesic. -Follow clinical response.  Type 2 diabetes mellitus with uncontrolled hyperglycemia -Updated A1c 7.5 -Elevated CBGs in the setting of infection. -Patient has been encourage to follow modified carbohydrate diet -Continue to follow CBGs and adjust hypoglycemic agents as  needed.  Lactic acidosis -In the setting of infection/dehydration -At time of admission lactic acid 2.3; after fluid resuscitation 1.5 -Continue current IV antibiotics and follow clinical response.  Dehydration -Continue to maintain adequate hydration.  Essential hypertension -Continue current antihypertensive agents -Heart healthy diet discussed with patient.  Mixed hyperlipidemia -Continue the use of Zocor --Diet discussed with patient. -Continue outpatient follow-up of patient's LFTs and lipid panel.  Class I obesity -Body mass index is 34.61 kg/m. --Low calorie diet, portion control and increase physical activity discussed with patient.  Subjective:  No fever, no chest pain, no nausea, no vomiting.  Continue slowly making progress from ongoing cellulitis in his left lower extremity.  Physical Exam: Vitals:   08/30/22 1205 08/30/22 2114 08/31/22 0547 08/31/22 1400  BP: 117/81 115/64 107/69 106/61  Pulse: 84 70 75 91  Resp: 20 16 18 18   Temp: 98 F (36.7 C) 98.3 F (36.8 C) 98.1 F (36.7 C) 98.4 F (36.9 C)  TempSrc: Oral Oral  Oral  SpO2: 100% 97% 96% 99%  Weight:      Height:       General exam: Alert, awake, oriented x 3; no chest pain, no shortness of breath, no nausea or vomiting.  Currently afebrile.  Still having pain in his left lower extremity. Respiratory system: Clear to auscultation. Respiratory effort normal.  Good saturation on room air. Cardiovascular system:RRR. No murmurs, rubs, gallops.  No JVD. Gastrointestinal system: Abdomen is obese, nondistended, soft and nontender. No organomegaly or masses felt. Normal bowel sounds heard. Central nervous system: Alert and oriented. No focal neurological deficits. Extremities: No cyanosis or clubbing; edema, erythema and overall pain continues to improve on his lower extremities (left more than right).  Still with findings of ongoing cellulitic process. Skin: No petechiae. Psychiatry: Judgement and insight  appear normal. Mood & affect appropriate.   Data Reviewed: A1c 7.5 CBC: WBCs 8.9, hemoglobin 13.7, platelet count 283 K Basic metabolic panel: Sodium 136, potassium 4.2, chloride 108, bicarb 21, BUN 16, creatinine 0.79, calcium 8.6, anion gap 7 CBGs: In the 150s to 300 range.  Family Communication: No family at bedside.  Disposition: Status is: Inpatient Remains inpatient appropriate because: Requiring IV antibiotics.   Planned Discharge Destination: Home   Author: Vassie Loll, MD 08/31/2022 6:02 PM  For on call review [email or web address].

## 2022-08-31 NOTE — Care Management Important Message (Signed)
Important Message  Patient Details  Name: Kurt [name] MRN: [id number] Date of Birth: 1951-05-31   Medicare Important Message Given:  Yes     Corey Harold 08/31/2022, 11:45 AM

## 2022-09-01 DIAGNOSIS — E669 Obesity, unspecified: Secondary | ICD-10-CM

## 2022-09-01 DIAGNOSIS — R7989 Other specified abnormal findings of blood chemistry: Secondary | ICD-10-CM

## 2022-09-01 DIAGNOSIS — L03116 Cellulitis of left lower limb: Secondary | ICD-10-CM | POA: Diagnosis not present

## 2022-09-01 DIAGNOSIS — I10 Essential (primary) hypertension: Secondary | ICD-10-CM | POA: Diagnosis not present

## 2022-09-01 DIAGNOSIS — E66811 Obesity, class 1: Secondary | ICD-10-CM

## 2022-09-01 DIAGNOSIS — E119 Type 2 diabetes mellitus without complications: Secondary | ICD-10-CM | POA: Diagnosis not present

## 2022-09-01 LAB — GLUCOSE, CAPILLARY
Glucose-Capillary: 214 mg/dL — ABNORMAL HIGH (ref 70–99)
Glucose-Capillary: 330 mg/dL — ABNORMAL HIGH (ref 70–99)
Glucose-Capillary: 284 mg/dL — ABNORMAL HIGH (ref 70–99)

## 2022-09-01 MED ORDER — GLIPIZIDE ER 10 MG PO TB24: 10.0000 mg | ORAL_TABLET | Freq: Every day | ORAL | Status: AC

## 2022-09-01 MED ORDER — DOXYCYCLINE HYCLATE 100 MG PO TABS: 100.0000 mg | ORAL_TABLET | Freq: Two times a day (BID) | ORAL | 0 refills | Status: DC

## 2022-09-01 MED ORDER — CEPHALEXIN 500 MG PO CAPS: 500.0000 mg | ORAL_CAPSULE | Freq: Two times a day (BID) | ORAL | 0 refills | Status: AC

## 2022-09-01 MED ORDER — CEPHALEXIN 500 MG PO CAPS: 500.0000 mg | ORAL_CAPSULE | Freq: Two times a day (BID) | ORAL | 0 refills | Status: DC

## 2022-09-01 MED ORDER — DOXYCYCLINE HYCLATE 100 MG PO TABS: 100.0000 mg | ORAL_TABLET | Freq: Two times a day (BID) | ORAL | 0 refills | Status: AC

## 2022-09-01 NOTE — Discharge Summary (Signed)
Physician Discharge Summary   Patient: Kurt [name] MRN: [id number] DOB: 08-Jan-1951  Admit date:     08/28/2022  Discharge date: 09/01/22  Discharge Physician: Vassie Loll   PCP: [facility]   Recommendations at discharge:  Repeat BMET to follow electrolytes and renal function. Reassess left lower extremity cellulitis for complete resolution of symptoms after completed antibiotic therapy. Continue to follow CBG/A1c with further adjustment to hypoglycemic regimen as needed Reassess blood pressure and adjust antihypertensive regimen as required.   Discharge Diagnoses: Principal Problem:   Left leg cellulitis Active Problems:   Insulin dependent type 2 diabetes mellitus (HCC)   Essential hypertension   Mixed hyperlipidemia   Lactic acidosis   Dehydration   Elevated lactic acid level   Class 1 obesity  Brief Hospital admission Course: As per H&P written by Dr. Thomes Dinning on 08/28/2022 Kurt [name] is a 71 y.o. male with medical history significant of T2DM, hypertension, hyperlipidemia who presents to the emergency department due to 2-week onset of left leg cellulitis, he states that he was placed on clindamycin (300 mg twice daily) which he has used for 10 days, but noted worsening in the redness on his leg due to spread to was proximal left thigh and towards the foot.  He denies chest pain, shortness of breath, fever, chills, nausea, vomiting   ED Course:  In the emergency department, he was hemodynamically stable.  Work-up in the ED showed normal CBC and BMP except for sodium 134, bicarb 20, glucose 233, BUN 28.  Lactic acid 2.3 > 1.5. Patient was empirically started on Zosyn, Dilaudid was given and IV hydration was provided.  Hospitalist was asked to admit patient for further evaluation and management.  Assessment and Plan: Left leg cellulitis -Patient has failed outpatient antibiotic therapy (1 week of clindamycin) -Underlying history of diabetes  and venous insufficiency. -Some skin breakdown/abrasion appreciated; no active drainage or abscess formation identified. -Right lower extremity demonstrating some erythema and posterior aspect with blister/hematoma (reabsorbing), appropriately healing. -After 4 days of IV antibiotics patient discharged home with 10 more days of oral regimen to complete therapy.  Using Keflex and doxycycline to mimic inpatient cefepime and vancomycin used. -Patient has been advised to keep leg elevated as much as possible and to use as needed analgesic. -Will benefit of outpatient referral to wound care center; to further assist with treatment of lymphedema/venous insufficiency.   Type 2 diabetes mellitus with uncontrolled hyperglycemia -Updated A1c 7.5 -Elevated CBGs in the setting of infection. -Advised to maintain adequate hydration and to follow modified carbohydrate diet -Home hypoglycemic agents has been resumed at discharge -Continue to follow CBGs and further adjust management as required.   Lactic acidosis -In the setting of infection/dehydration -At time of admission lactic acid 2.3; after fluid resuscitation 1.5 -Continue current IV antibiotics and follow clinical response.   Dehydration -Resolved/corrected at time of discharge -Patient advised to maintain adequate hydration.   Essential hypertension -Continue current antihypertensive agents -Heart healthy diet discussed with patient.   Mixed hyperlipidemia -Continue the use of Zocor --Diet discussed with patient. -Continue outpatient follow-up of patient's LFTs and lipid panel.   Class I obesity -Body mass index is 34.61 kg/m. --Low calorie diet, portion control and increase physical activity discussed with patient.   Consultants: None Procedures performed: See below for x-ray reports Disposition: Home Diet recommendation: Heart healthy, low calorie and modified carbohydrate diet.  DISCHARGE MEDICATION: Allergies as of 09/01/2022        Reactions  Augmentin [amoxicillin-pot Clavulanate] Nausea And Vomiting   Iodine Hives   Tape Rash   Paper         Medication List     STOP taking these medications    clindamycin 300 MG capsule Commonly known as: CLEOCIN       TAKE these medications    acetaminophen 650 MG CR tablet Commonly known as: TYLENOL Take 1,300 mg by mouth every 8 (eight) hours as needed for pain.   aspirin 81 MG chewable tablet Chew 1 tablet (81 mg total) by mouth daily.   B-D ULTRAFINE III SHORT PEN 31G X 8 MM Misc Generic drug: Insulin Pen Needle Inject into the skin.   cephALEXin 500 MG capsule Commonly known as: KEFLEX Take 1 capsule (500 mg total) by mouth 2 (two) times daily for 10 days.   docusate sodium 100 MG capsule Commonly known as: COLACE Take 100 mg by mouth 2 (two) times daily.   doxycycline 100 MG tablet Commonly known as: VIBRA-TABS Take 1 tablet (100 mg total) by mouth 2 (two) times daily for 10 days.   Fish Oil 1000 MG Caps Take by mouth.   glipiZIDE 10 MG 24 hr tablet Commonly known as: GLUCOTROL XL Take 1 tablet (10 mg total) by mouth daily with breakfast. What changed: when to take this   HYDROcodone-acetaminophen 7.5-325 MG tablet Commonly known as: NORCO Take 1 tablet by mouth daily as needed for moderate pain.   ketoconazole 2 % cream Commonly known as: NIZORAL Apply 1 Application topically daily.   Lantus SoloStar 100 UNIT/ML Solostar Pen Generic drug: insulin glargine Inject 34 Units into the skin at bedtime.   lisinopril 20 MG tablet Commonly known as: ZESTRIL Take 20 mg by mouth daily.   methocarbamol 500 MG tablet Commonly known as: ROBAXIN Take 500 mg by mouth every 8 (eight) hours as needed for muscle spasms.   multivitamin tablet Take 1 tablet by mouth daily.   OneTouch Ultra test strip Generic drug: glucose blood 1 each 2 (two) times daily.   simvastatin 20 MG tablet Commonly known as: ZOCOR Take 20 mg by mouth every  evening.   SitaGLIPtin-MetFORMIN HCl [phone] MG Tb24 Take 1 tablet by mouth at bedtime.   zinc gluconate 50 MG tablet Take 50 mg by mouth daily.        Follow-up Information     [facility] Schedule an appointment as soon as possible for a visit in 2 week(s).   Contact information: [address] [place] [address] [phone]                Discharge Exam: Filed Weights   08/28/22 1659  Weight: 94.3 kg   General exam: Alert, awake, oriented x 3; no chest pain, no shortness of breath, no nausea or vomiting.  Currently afebrile.  Still having pain in his left lower extremity. Respiratory system: Clear to auscultation. Respiratory effort normal.  Good saturation on room air. Cardiovascular system:RRR. No murmurs, rubs, gallops.  No JVD. Gastrointestinal system: Abdomen is obese, nondistended, soft and nontender. No organomegaly or masses felt. Normal bowel sounds heard. Central nervous system: Alert and oriented. No focal neurological deficits. Extremities: No cyanosis or clubbing; edema, erythema and overall pain continues to improve on his lower extremities (left more than right).  Still with findings of ongoing cellulitic process. Skin: No petechiae. Psychiatry: Judgement and insight appear normal. Mood & affect appropriate.   Condition at discharge: Stable and improved.  The results  of significant diagnostics from this hospitalization (including imaging, microbiology, ancillary and laboratory) are listed below for reference.   Imaging Studies: No results found.  Microbiology: Results for orders placed or performed during the hospital encounter of 07/11/21  Aerobic Culture w Gram Stain (superficial specimen)     Status: None   Collection Time: 07/11/21  4:55 PM   Specimen: Leg  Result Value Ref Range Status   Specimen Description   Final    LEG Performed at [facility], [address] [place], [place] [address]    Special  Requests   Final    NONE Performed at [facility], [address] [place], [place] [address]    Gram Stain NO WBC SEEN NO ORGANISMS SEEN   Final   Culture   Final    FEW NORMAL SKIN FLORA NO STAPHYLOCOCCUS AUREUS ISOLATED Performed at [facility], [place] [address], [place], [place] [address]    Report Status 07/13/2021 FINAL  Final    Labs: CBC: Recent Labs  Lab 08/28/22 1803 08/29/22 0451 08/31/22 0505  WBC 10.2 11.6* 8.9  NEUTROABS 6.6  --   --   HGB 14.9 12.9* 13.7  HCT 44.9 39.5 42.5  MCV 88.0 89.0 89.3  PLT 313 285 638   Basic Metabolic Panel: Recent Labs  Lab 08/28/22 1803 08/29/22 0451 08/31/22 0505  NA 134* 132* 136  K 4.3 4.1 4.2  CL 103 107 108  CO2 20* 22 21*  GLUCOSE 233* 188* 180*  BUN 28* 24* 16  CREATININE 0.92 0.88 0.79  CALCIUM 9.1 8.0* 8.6*  MG  --  1.9  --   PHOS  --  3.3  --    Liver Function Tests: Recent Labs  Lab 08/28/22 1803 08/29/22 0451  AST 16 14*  ALT 19 16  ALKPHOS 67 64  BILITOT 0.7 0.6  PROT 7.2 6.0*  ALBUMIN 3.9 3.2*   CBG: Recent Labs  Lab 08/31/22 1150 08/31/22 1601 08/31/22 2036 09/01/22 0733 09/01/22 1135  GLUCAP 307* 153* 319* 214* 330*    Discharge time spent: greater than 30 minutes.  Signed: Barton Dubois, MD [facility] 09/01/2022

## 2022-09-14 NOTE — Progress Notes (Signed)
[facility] Note  09/17/2022     CHIEF COMPLAINT Patient presents for Retina Follow Up  HISTORY OF PRESENT ILLNESS: Kurt [name] is a 71 y.o. male who presents to the clinic today for:  HPI     Retina Follow Up   Patient presents with  CRVO/BRVO.  In right eye.  Severity is moderate.  Duration of 8 weeks.  Since onset it is stable.  I, the attending physician,  performed the HPI with the patient and updated documentation appropriately.        Comments   Pt here for 8 wk ret f/u BRVO OD. Pt states [facility] the same and that he was hospitalized 9/12-9/17 for celluitis. No other changes.       Last edited by Bernarda Caffey, MD on 09/17/2022  5:22 PM.    Pt states vision is stable, he was hospitalized for cellulitis for 5 days in September   Referring physician:  The [facility] [place],  [place] [address]  HISTORICAL INFORMATION:   Selected notes from the MEDICAL RECORD NUMBER Diabetic Eval per Dr. Frederico Hamman   CURRENT MEDICATIONS: No current outpatient medications on file. (Ophthalmic Drugs)   No current facility-administered medications for this visit. (Ophthalmic Drugs)   Current Outpatient Medications (Other)  Medication Sig   acetaminophen (TYLENOL) 650 MG CR tablet Take 1,300 mg by mouth every 8 (eight) hours as needed for pain.   aspirin 81 MG chewable tablet Chew 1 tablet (81 mg total) by mouth daily.   B-D ULTRAFINE III SHORT PEN 31G X 8 MM MISC Inject into the skin.   docusate sodium (COLACE) 100 MG capsule Take 100 mg by mouth 2 (two) times daily.   glipiZIDE (GLUCOTROL XL) 10 MG 24 hr tablet Take 1 tablet (10 mg total) by mouth daily with breakfast.   HYDROcodone-acetaminophen (NORCO) 7.5-325 MG tablet Take 1 tablet by mouth daily as needed for moderate pain.   ketoconazole (NIZORAL) 2 % cream Apply 1 Application topically daily.   LANTUS SOLOSTAR 100 UNIT/ML Solostar Pen Inject 34 Units into the skin at  bedtime.   lisinopril (ZESTRIL) 20 MG tablet Take 20 mg by mouth daily.   methocarbamol (ROBAXIN) 500 MG tablet Take 500 mg by mouth every 8 (eight) hours as needed for muscle spasms.   Multiple Vitamin (MULTIVITAMIN) tablet Take 1 tablet by mouth daily.   Omega-3 Fatty Acids (FISH OIL) 1000 MG CAPS Take by mouth.   ONETOUCH ULTRA test strip 1 each 2 (two) times daily.   simvastatin (ZOCOR) 20 MG tablet Take 20 mg by mouth every evening.   SitaGLIPtin-MetFORMIN HCl [phone] MG TB24 Take 1 tablet by mouth at bedtime.   zinc gluconate 50 MG tablet Take 50 mg by mouth daily.   No current facility-administered medications for this visit. (Other)   REVIEW OF SYSTEMS: ROS   Positive for: Musculoskeletal, Endocrine, Eyes Negative for: Constitutional, Gastrointestinal, Neurological, Skin, Genitourinary, HENT, Cardiovascular, Respiratory, Psychiatric, Allergic/Imm, Heme/Lymph Last edited by Kingsley Spittle, COT on 09/17/2022  3:01 PM.     ALLERGIES Allergies  Allergen Reactions   Augmentin [Amoxicillin-Pot Clavulanate] Nausea And Vomiting   Iodine Hives   Tape Rash    Paper    PAST MEDICAL HISTORY Past Medical History:  Diagnosis Date   Arthritis    Bronchitis    Bronchitis    Cataract    OU   Concussion    late 1990's  after a  fall   GERD (gastroesophageal reflux disease)    History of kidney stones    Hypertension    Hypertensive retinopathy    OU   Osteogenesis imperfecta    PONV (postoperative nausea and vomiting)    pt has post polio syndrome   Post-polio syndrome    Type 2 diabetes mellitus ([place]) 12/25/2018   Past Surgical History:  Procedure Laterality Date   ANKLE FRACTURE SURGERY Bilateral    arm surgery Right    nerve surgery   COLONOSCOPY     ELBOW FRACTURE SURGERY Left    EYE MUSCLE SURGERY Left    LEG SURGERY Left    femur fracture with rod   REVERSE SHOULDER ARTHROPLASTY Right 11/10/2019   Procedure: RIGHT REVERSE SHOULDER ARTHROPLASTY;  Surgeon: Meredith Pel, MD;  Location: [facility];  Service: Orthopedics;  Laterality: Right;   TONSILLECTOMY     FAMILY HISTORY Family History  Problem Relation Age of Onset   Hypertension Mother    Diabetes Brother    SOCIAL HISTORY Social History   Tobacco Use   Smoking status: Never   Smokeless tobacco: Never  Vaping Use   Vaping Use: Never used  Substance Use Topics   Alcohol use: Yes    Comment: 1 beer occasionally   Drug use: No       OPHTHALMIC EXAM: Base Eye Exam     Visual Acuity (Snellen - Linear)       Right Left   Dist cc 20/80 -1 20/25   Dist ph cc 20/40 +2     Correction: Glasses         Tonometry (Tonopen, 3:15 PM)       Right Left   Pressure 10 12         Pupils       Dark Light Shape React APD   Right 3 2 Round Brisk None   Left 3 2 Round Brisk None         Visual Fields (Counting fingers)       Left Right    Full Full         Extraocular Movement       Right Left    Full, Ortho Full, Ortho         Neuro/Psych     Oriented x3: Yes   Mood/Affect: Normal         Dilation     Both eyes: 1.0% Mydriacyl, 2.5% Phenylephrine @ 3:16 PM           Slit Lamp and Fundus Exam     Slit Lamp Exam       Right Left   Lids/Lashes Dermatochalasis - upper lid, mild Meibomian gland dysfunction Dermatochalasis - upper lid, Dermatochalasis - lower lid, mild Meibomian gland dysfunction   Conjunctiva/Sclera blue sclera blue sclera   Cornea arcus, 1+ PEE, mild tear film debris arcus, Rives   Anterior Chamber deep and clear deep and clear   Iris round and dilated, no NVI round and dilated, no NVI   Lens 2-3+ NS w/brunescence, 2-3+CS 2-3+ NS w/brunesecence, 2-3+CS   Anterior Vitreous mild syneresis, PVD, vitreous condensations mild syneresis         Fundus Exam       Right Left   Disc pink and sharp pink and sharp   C/D Ratio 0.5 0.4   Macula Good foveal reflex; trace persistent edema SN macula, mild focal laser changes, no heme flat,  good foveal reflex, mild RPE  mottling and clumping, +drusen, No heme or edema   Vessels attenuated, Tortuous attenuated, Tortuous   Periphery attached, no heme attached, no heme           Refraction     Wearing Rx       Sphere Cylinder Axis Add   Right -2.50 +2.00 105 +2.75   Left -1.50 +1.75 101 +2.75         Manifest Refraction       Sphere Cylinder Axis Dist VA   Right -4.00 +1.75 105 20/30+1   Left               IMAGING AND PROCEDURES  Imaging and Procedures for  OCT, Retina - OU - Both Eyes       Right Eye Quality was good. Central Foveal Thickness: 237. Progression has been stable. Findings include normal foveal contour, no SRF, retinal drusen , intraretinal fluid, outer retinal atrophy (Mild interval improvement in IRF SN macula).   Left Eye Quality was good. Central Foveal Thickness: 266. Progression has been stable. Findings include normal foveal contour, no IRF, no SRF, retinal drusen , vitreomacular adhesion (Focal PED/drusen).   Notes *Images captured and stored on drive  Diagnosis / Impression:  OD: BRVO with mild interval improvement in IRF SN macula OS: NFP, no SRF/IRF, drusen, VMA  Clinical management:  See below  Abbreviations: NFP - Normal foveal profile. CME - cystoid macular edema. PED - pigment epithelial detachment. IRF - intraretinal fluid. SRF - subretinal fluid. EZ - ellipsoid zone. ERM - epiretinal membrane. ORA - outer retinal atrophy. ORT - outer retinal tubulation. SRHM - subretinal hyper-reflective material       Intravitreal Injection, Pharmacologic Agent - OD - Right Eye       Time Out 09/17/2022. 5:24 PM. Confirmed correct patient, procedure, site, and patient consented.   Procedure A (32 g) needle was used.   Injection: 1.25 mg Bevacizumab 1.77m/0.05ml   Route: Intravitreal, Site: Right Eye   NDC: 50242-060-01   Post-op Post injection exam found visual acuity of at least counting fingers. The patient tolerated  the procedure well. There were no complications. The patient received written and verbal post procedure care education. Post injection medications were not given.   Notes **ERRONEOUS DUPLICATE ORDER**     Intravitreal Injection, Pharmacologic Agent - OD - Right Eye       Time Out 09/17/2022. 4:43 PM. Confirmed correct patient, procedure, site, and patient consented.   Anesthesia Topical anesthesia was used. Anesthetic medications included Lidocaine 2%, Proparacaine 0.5%.   Procedure Preparation included 5% betadine to ocular surface, eyelid speculum. A (32g) needle was used.   Injection: 2 mg aflibercept 2 MG/0.05ML   Route: Intravitreal, Site: Right Eye   NDC: 6A3590391 Lot: [id number] Expiration date: 09/16/2023, Waste: 0 mL   Post-op Post injection exam found visual acuity of at least counting fingers. The patient tolerated the procedure well. There were no complications. The patient received written and verbal post procedure care education. Post injection medications were not given.            ASSESSMENT/PLAN:   ICD-10-CM   1. Branch retinal vein occlusion of right eye with macular edema  H34.8310 OCT, Retina - OU - Both Eyes    Intravitreal Injection, Pharmacologic Agent - OD - Right Eye    Intravitreal Injection, Pharmacologic Agent - OD - Right Eye    aflibercept (EYLEA) SOLN 2 mg    Bevacizumab (AVASTIN) SOLN 1.25 mg  2. Diabetes mellitus type 2 without retinopathy ([place])  E11.9     3. Essential hypertension  I10     4. Hypertensive retinopathy of both eyes  H35.033     5. Combined forms of age-related cataract of both eyes  H25.813      1. BRVO with CME OD  - s/p IVA OD #1 (01.19.21), #2 (02.16.21), #3 (03.16.21), #4 (4.13.21) -- IVA resistance             - s/p IVE OD #1 (05.11.21--sample), #2 (06.08.21), #3 (07.06.21), #4 (8.3.21), #5 (08.31.21), #6 (09.28.21), #7 (10.26.21), #8 (11.23.21), #9 (12.22.21), #10 (1.24.22), #11 (2.22.22), #12 (3.22.22),  #13 (04.19.22), #14 (05.25.22), #15 (06.29.22), #16 (07.27.22), #17 (08.24.22), #18 (09.21.22), #19 (10.19.22), #20 (11.22.22), #21 (12.20.22), #22 (01.18.23), #23 (02.22.23), #24 (03.28.23), #25 (05.09.23), #26 (06.20.23), #27 (08.07.23)  - s/p focal laser OD (07.13.22)  - BCVA stable at 20/40 -- decreased  - exam with focal edema, IRH, CWS superonasal macula -- improved  - OCT shows BRVO with mild interval improvement in IRF SN macula at 8 weeks  - recommend IVE OD #28 today (10.02.23) w/ f/u at 8 wks  - RBA of procedure discussed, questions answered  - informed consent obtained  - Avastin informed consent form re-signed and scanned on 05.09.23  - Eylea informed consent form re-signed and scanned on 05.09.23  - see procedure note  - Eylea4U benefits investigation started, 05.11.21 -- approved through [employer] for 2023  - F/U 8 wks -- DFE/OCT/possible injection, tx and ext as able  2. Diabetes mellitus, type 2 without retinopathy OU  - The incidence, risk factors for progression, natural history and treatment options for diabetic retinopathy  were discussed with patient.    - The need for close monitoring of blood glucose, blood pressure, and serum lipids, avoiding cigarette or any type of tobacco, and the need for long term follow up was also discussed with patient.  - monitor  3,4. Hypertensive retinopathy OU  - discussed importance of tight BP control  - monitor  5. Age related cataracts OU   - The symptoms of cataract, surgical options, and treatments and risks were discussed with patient.  - discussed diagnosis and progression  - monitor  Ophthalmic Meds Ordered this visit:  Meds ordered this encounter  Medications   aflibercept (EYLEA) SOLN 2 mg   Bevacizumab (AVASTIN) SOLN 1.25 mg     Return in about 8 weeks (around 11/12/2022) for f/u BRVO OD, DFE, OCT.  There are no Patient Instructions on file for this visit.   This document serves as a record of services  personally performed by Gardiner Sleeper, MD, PhD. It was created on their behalf by Roselee Nova, COMT. The creation of this record is the provider's dictation and/or activities during the visit.  Electronically signed by: Roselee Nova, COMT 09/17/22 6:42 PM  This document serves as a record of services personally performed by Gardiner Sleeper, MD, PhD. It was created on their behalf by San Jetty. Owens Shark, OA an ophthalmic technician. The creation of this record is the provider's dictation and/or activities during the visit.    Electronically signed by: San Jetty. [place], [place] 10.02.2023 6:42 PM  Gardiner Sleeper, M.D., Ph.D. Diseases & Surgery of the Retina and Vitreous [facility]  I have reviewed the above documentation for accuracy and completeness, and I agree with the above. Gardiner Sleeper, M.D., Ph.D. 09/17/22 6:43 PM    Abbreviations: Jerilynn Mages  myopia (nearsighted); A astigmatism; H hyperopia (farsighted); P presbyopia; Mrx spectacle prescription;  CTL contact lenses; OD right eye; OS left eye; OU both eyes  XT exotropia; ET esotropia; PEK punctate epithelial keratitis; PEE punctate epithelial erosions; DES dry eye syndrome; MGD meibomian gland dysfunction; ATs artificial tears; PFAT's preservative free artificial tears; [place] nuclear sclerotic cataract; PSC posterior subcapsular cataract; ERM epi-retinal membrane; PVD posterior vitreous detachment; RD retinal detachment; DM diabetes mellitus; DR diabetic retinopathy; NPDR non-proliferative diabetic retinopathy; PDR proliferative diabetic retinopathy; CSME clinically significant macular edema; DME diabetic macular edema; dbh dot blot hemorrhages; CWS cotton wool spot; POAG primary open angle glaucoma; C/D cup-to-disc ratio; HVF humphrey visual field; GVF goldmann visual field; OCT optical coherence tomography; IOP intraocular pressure; BRVO Branch retinal vein occlusion; CRVO central retinal vein occlusion; CRAO central retinal artery  occlusion; BRAO branch retinal artery occlusion; RT retinal tear; SB scleral buckle; PPV pars plana vitrectomy; VH Vitreous hemorrhage; PRP panretinal laser photocoagulation; IVK intravitreal kenalog; VMT vitreomacular traction; MH Macular hole;  NVD neovascularization of the disc; NVE neovascularization elsewhere; AREDS age related eye disease study; ARMD age related macular degeneration; POAG primary open angle glaucoma; EBMD epithelial/anterior basement membrane dystrophy; ACIOL anterior chamber intraocular lens; IOL intraocular lens; PCIOL posterior chamber intraocular lens; Phaco/IOL phacoemulsification with intraocular lens placement; [place] photorefractive keratectomy; LASIK laser assisted in situ keratomileusis; HTN hypertension; DM diabetes mellitus; COPD chronic obstructive pulmonary disease

## 2022-09-17 ENCOUNTER — Ambulatory Visit (INDEPENDENT_AMBULATORY_CARE_PROVIDER_SITE_OTHER): Payer: Medicare Other | Admitting: Ophthalmology

## 2022-09-17 ENCOUNTER — Encounter (INDEPENDENT_AMBULATORY_CARE_PROVIDER_SITE_OTHER): Payer: Self-pay | Admitting: Ophthalmology

## 2022-09-17 DIAGNOSIS — H25813 Combined forms of age-related cataract, bilateral: Secondary | ICD-10-CM

## 2022-09-17 DIAGNOSIS — H35033 Hypertensive retinopathy, bilateral: Secondary | ICD-10-CM

## 2022-09-17 DIAGNOSIS — I10 Essential (primary) hypertension: Secondary | ICD-10-CM

## 2022-09-17 DIAGNOSIS — H34831 Tributary (branch) retinal vein occlusion, right eye, with macular edema: Secondary | ICD-10-CM

## 2022-09-17 DIAGNOSIS — E119 Type 2 diabetes mellitus without complications: Secondary | ICD-10-CM

## 2022-09-17 MED ORDER — BEVACIZUMAB CHEMO INJECTION 1.25MG/0.05ML SYRINGE FOR KALEIDOSCOPE
1.2500 mg | INTRAVITREAL | Status: AC | PRN
  Administered 2022-09-17: 1.25 mg via INTRAVITREAL

## 2022-09-17 MED ORDER — AFLIBERCEPT 2MG/0.05ML IZ SOLN FOR KALEIDOSCOPE
2.0000 mg | INTRAVITREAL | Status: AC | PRN
  Administered 2022-09-17: 2 mg via INTRAVITREAL

## 2022-11-07 NOTE — Progress Notes (Signed)
[facility] - Clinic Note  11/12/2022     CHIEF COMPLAINT Patient presents for Retina Follow Up  HISTORY OF PRESENT ILLNESS: Kurt [name] is a 71 y.o. male who presents to the clinic today for:  HPI     Retina Follow Up   Patient presents with  CRVO/BRVO.  In right eye.  This started 8 weeks ago.  Severity is moderate.  Duration of 8 weeks.  Since onset it is stable.  I, the attending physician,  performed the HPI with the patient and updated documentation appropriately.        Comments   8 week retina follow up BRVO pt states no vision changes noticed       Last edited by Rennis Chris, MD on 11/12/2022  4:20 PM.    Pt states    Referring physician:  [facility] [address] [place],  [place] [address]  HISTORICAL INFORMATION:   Selected notes from the MEDICAL RECORD NUMBER Diabetic Eval per Dr. Karleen Hampshire   CURRENT MEDICATIONS: No current outpatient medications on file. (Ophthalmic Drugs)   No current facility-administered medications for this visit. (Ophthalmic Drugs)   Current Outpatient Medications (Other)  Medication Sig   acetaminophen (TYLENOL) 650 MG CR tablet Take 1,300 mg by mouth every 8 (eight) hours as needed for pain.   aspirin 81 MG chewable tablet Chew 1 tablet (81 mg total) by mouth daily.   B-D ULTRAFINE III SHORT PEN 31G X 8 MM MISC Inject into the skin.   docusate sodium (COLACE) 100 MG capsule Take 100 mg by mouth 2 (two) times daily.   glipiZIDE (GLUCOTROL XL) 10 MG 24 hr tablet Take 1 tablet (10 mg total) by mouth daily with breakfast.   HYDROcodone-acetaminophen (NORCO) 7.5-325 MG tablet Take 1 tablet by mouth daily as needed for moderate pain.   ketoconazole (NIZORAL) 2 % cream Apply 1 Application topically daily.   LANTUS SOLOSTAR 100 UNIT/ML Solostar Pen Inject 34 Units into the skin at bedtime.   lisinopril (ZESTRIL) 20 MG tablet Take 20 mg by mouth daily.   methocarbamol (ROBAXIN) 500 MG  tablet Take 500 mg by mouth every 8 (eight) hours as needed for muscle spasms.   Multiple Vitamin (MULTIVITAMIN) tablet Take 1 tablet by mouth daily.   Omega-3 Fatty Acids (FISH OIL) 1000 MG CAPS Take by mouth.   ONETOUCH ULTRA test strip 1 each 2 (two) times daily.   simvastatin (ZOCOR) 20 MG tablet Take 20 mg by mouth every evening.   SitaGLIPtin-MetFORMIN HCl [phone] MG TB24 Take 1 tablet by mouth at bedtime.   zinc gluconate 50 MG tablet Take 50 mg by mouth daily.   No current facility-administered medications for this visit. (Other)   REVIEW OF SYSTEMS:   ALLERGIES Allergies  Allergen Reactions   Augmentin [Amoxicillin-Pot Clavulanate] Nausea And Vomiting   Iodine Hives   Tape Rash    Paper    PAST MEDICAL HISTORY Past Medical History:  Diagnosis Date   Arthritis    Bronchitis    Bronchitis    Cataract    OU   Concussion    late 1990's  after a fall   GERD (gastroesophageal reflux disease)    History of kidney stones    Hypertension    Hypertensive retinopathy    OU   Osteogenesis imperfecta    PONV (postoperative nausea and vomiting)    pt has post polio syndrome   Post-polio syndrome  Type 2 diabetes mellitus (HCC) 12/25/2018   Past Surgical History:  Procedure Laterality Date   ANKLE FRACTURE SURGERY Bilateral    arm surgery Right    nerve surgery   COLONOSCOPY     ELBOW FRACTURE SURGERY Left    EYE MUSCLE SURGERY Left    LEG SURGERY Left    femur fracture with rod   REVERSE SHOULDER ARTHROPLASTY Right 11/10/2019   Procedure: RIGHT REVERSE SHOULDER ARTHROPLASTY;  Surgeon: Cammy Copaean, Gregory Scott, MD;  Location: [facility] OR;  Service: Orthopedics;  Laterality: Right;   TONSILLECTOMY     FAMILY HISTORY Family History  Problem Relation Age of Onset   Hypertension Mother    Diabetes Brother    SOCIAL HISTORY Social History   Tobacco Use   Smoking status: Never   Smokeless tobacco: Never  Vaping Use   Vaping Use: Never used  Substance Use Topics    Alcohol use: Yes    Comment: 1 beer occasionally   Drug use: No       OPHTHALMIC EXAM: Base Eye Exam     Visual Acuity (Snellen - Linear)       Right Left   Dist cc 20/100 -2 20/25 -3   Dist ph cc 20/30 -2     Correction: Glasses         Tonometry (Tonopen, 2:47 PM)       Right Left   Pressure 14 14         Pupils       Pupils Dark Light Shape React APD   Right PERRL 3 2 Round Brisk None   Left PERRL 3 2 Round Brisk None         Visual Fields       Left Right    Full Full         Extraocular Movement       Right Left    Full, Ortho Full, Ortho         Neuro/Psych     Oriented x3: Yes   Mood/Affect: Normal         Dilation     Both eyes: 2.5% Phenylephrine @ 2:49 PM           Slit Lamp and Fundus Exam     Slit Lamp Exam       Right Left   Lids/Lashes Dermatochalasis - upper lid, mild Meibomian gland dysfunction Dermatochalasis - upper lid, Dermatochalasis - lower lid, mild Meibomian gland dysfunction   Conjunctiva/Sclera blue sclera blue sclera   Cornea arcus, 1+ PEE, mild tear film debris arcus, SCH   Anterior Chamber deep and clear deep and clear   Iris round and dilated, no NVI round and dilated, no NVI   Lens 2-3+ NS w/brunescence, 2-3+CS 2-3+ NS w/brunesecence, 2-3+CS   Anterior Vitreous mild syneresis, PVD, vitreous condensations mild syneresis         Fundus Exam       Right Left   Disc pink and sharp pink and sharp   C/D Ratio 0.5 0.4   Macula Good foveal reflex; trace persistent edema SN macula -- improved, mild focal laser changes, no heme flat, good foveal reflex, mild RPE mottling and clumping, +drusen, No heme or edema   Vessels attenuated, Tortuous attenuated, Tortuous   Periphery attached, no heme attached, no heme           Refraction     Wearing Rx       Sphere Cylinder Axis Add  Right -2.50 +2.00 105 +2.75   Left -1.50 +1.75 101 +2.75           IMAGING AND PROCEDURES  Imaging and  Procedures for  OCT, Retina - OU - Both Eyes       Right Eye Quality was good. Central Foveal Thickness: 234. Progression has improved. Findings include normal foveal contour, no SRF, retinal drusen , intraretinal fluid, outer retinal atrophy (interval improvement in IRF SN macula).   Left Eye Quality was good. Central Foveal Thickness: 260. Progression has been stable. Findings include normal foveal contour, no IRF, no SRF, retinal drusen , vitreomacular adhesion (Focal PED/drusen).   Notes *Images captured and stored on drive  Diagnosis / Impression:  OD: BRVO with mild interval improvement in IRF SN macula OS: NFP, no SRF/IRF, drusen, VMA  Clinical management:  See below  Abbreviations: NFP - Normal foveal profile. CME - cystoid macular edema. PED - pigment epithelial detachment. IRF - intraretinal fluid. SRF - subretinal fluid. EZ - ellipsoid zone. ERM - epiretinal membrane. ORA - outer retinal atrophy. ORT - outer retinal tubulation. SRHM - subretinal hyper-reflective material       Intravitreal Injection, Pharmacologic Agent - OD - Right Eye       Time Out 11/12/2022. 3:39 PM. Confirmed correct patient, procedure, site, and patient consented.   Anesthesia Topical anesthesia was used. Anesthetic medications included Lidocaine 2%, Proparacaine 0.5%.   Procedure Preparation included 5% betadine to ocular surface, eyelid speculum. A (32 g) needle was used.   Injection: 2 mg aflibercept 2 MG/0.05ML   Route: Intravitreal, Site: Right Eye   NDC: L6038910, Lot: [id number], Expiration date: 02/14/2024, Waste: 0 mL   Post-op Post injection exam found visual acuity of at least counting fingers. The patient tolerated the procedure well. There were no complications. The patient received written and verbal post procedure care education. Post injection medications were not given.            ASSESSMENT/PLAN:   ICD-10-CM   1. Branch retinal vein occlusion of right eye  with macular edema  H34.8310 OCT, Retina - OU - Both Eyes    Intravitreal Injection, Pharmacologic Agent - OD - Right Eye    aflibercept (EYLEA) SOLN 2 mg    2. 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 Essential hypertension  I10     4. Hypertensive retinopathy of both eyes  H35.033     5. Combined forms of age-related cataract of both eyes  H25.813      1. BRVO with CME OD  - s/p IVA OD #1 (01.19.21), #2 (02.16.21), #3 (03.16.21), #4 (4.13.21) -- IVA resistance             - s/p IVE OD #1 (05.11.21--sample), #2 (06.08.21), #3 (07.06.21), #4 (8.3.21), #5 (08.31.21), #6 (09.28.21), #7 (10.26.21), #8 (11.23.21), #9 (12.22.21), #10 (1.24.22), #11 (2.22.22), #12 (3.22.22), #13 (04.19.22), #14 (05.25.22), #15 (06.29.22), #16 (07.27.22), #17 (08.24.22), #18 (09.21.22), #19 (10.19.22), #20 (11.22.22), #21 (12.20.22), #22 (01.18.23), #23 (02.22.23), #24 (03.28.23), #25 (05.09.23), #26 (06.20.23), #27 (08.07.23), #28 (10.02.23)  - s/p focal laser OD (07.13.22)  - BCVA stable at 20/40 -- decreased  - exam with focal edema, IRH, CWS superonasal macula -- improved  - OCT shows BRVO with mild interval improvement in IRF SN macula at 8 weeks  - recommend IVE OD #29 today (11.27.23) w/ f/u at 8 wks  - RBA of procedure discussed, questions answered  - informed consent obtained  -  Avastin informed consent form re-signed and scanned on 05.09.23  - Eylea informed consent form re-signed and scanned on 05.09.23  - see procedure note  - Eylea4U benefits investigation started, 05.11.21 -- approved through [employer] for 2023  - F/U 8 wks -- DFE/OCT/possible injection, tx and ext as able  2. Diabetes mellitus, type 2 without retinopathy OU  - The incidence, risk factors for progression, natural history and treatment options for diabetic retinopathy  were discussed with patient.    - The need for close monitoring of blood glucose, blood pressure, and serum lipids, avoiding cigarette or  any type of tobacco, and the need for long term follow up was also discussed with patient.  - monitor  3,4. Hypertensive retinopathy OU  - discussed importance of tight BP control  - monitor  5. Age related cataracts OU   - The symptoms of cataract, surgical options, and treatments and risks were discussed with patient.  - discussed diagnosis and progression  - monitor  Ophthalmic Meds Ordered this visit:  Meds ordered this encounter  Medications   aflibercept (EYLEA) SOLN 2 mg     Return in about 8 weeks (around 01/07/2023) for f/u BRVO OD, DFE, OCT.  There are no Patient Instructions on file for this visit.   This document serves as a record of services personally performed by Karie Chimera, MD, PhD. It was created on their behalf by Annalee Genta, COMT. The creation of this record is the provider's dictation and/or activities during the visit.  Electronically signed by: Annalee Genta, COMT 11/13/22 9:09 AM  This document serves as a record of services personally performed by Karie Chimera, MD, PhD. It was created on their behalf by Glee Arvin. Manson Passey, OA an ophthalmic technician. The creation of this record is the provider's dictation and/or activities during the visit.    Electronically signed by: Glee Arvin. Manson Passey, [place] 11.27.2023 9:09 AM  Karie Chimera, M.D., Ph.D. Diseases & Surgery of the Retina and Vitreous [facility]  I have reviewed the above documentation for accuracy and completeness, and I agree with the above. Karie Chimera, M.D., Ph.D. 11/13/22 9:09 AM  Abbreviations: M myopia (nearsighted); A astigmatism; H hyperopia (farsighted); P presbyopia; Mrx spectacle prescription;  CTL contact lenses; OD right eye; OS left eye; OU both eyes  XT exotropia; ET esotropia; PEK punctate epithelial keratitis; PEE punctate epithelial erosions; DES dry eye syndrome; MGD meibomian gland dysfunction; ATs artificial tears; PFAT's preservative free artificial  tears; NSC nuclear sclerotic cataract; PSC posterior subcapsular cataract; ERM epi-retinal membrane; PVD posterior vitreous detachment; RD retinal detachment; DM diabetes mellitus; DR diabetic retinopathy; NPDR non-proliferative diabetic retinopathy; PDR proliferative diabetic retinopathy; CSME clinically significant macular edema; DME diabetic macular edema; dbh dot blot hemorrhages; CWS cotton wool spot; POAG primary open angle glaucoma; C/D cup-to-disc ratio; HVF humphrey visual field; GVF goldmann visual field; OCT optical coherence tomography; IOP intraocular pressure; BRVO Branch retinal vein occlusion; CRVO central retinal vein occlusion; CRAO central retinal artery occlusion; BRAO branch retinal artery occlusion; RT retinal tear; SB scleral buckle; PPV pars plana vitrectomy; VH Vitreous hemorrhage; PRP panretinal laser photocoagulation; IVK intravitreal kenalog; VMT vitreomacular traction; MH Macular hole;  NVD neovascularization of the disc; NVE neovascularization elsewhere; AREDS age related eye disease study; ARMD age related macular degeneration; POAG primary open angle glaucoma; EBMD epithelial/anterior basement membrane dystrophy; ACIOL anterior chamber intraocular lens; IOL intraocular lens; PCIOL posterior chamber intraocular lens; Phaco/IOL phacoemulsification with intraocular lens placement; PRK  photorefractive keratectomy; LASIK laser assisted in situ keratomileusis; HTN hypertension; DM diabetes mellitus; COPD chronic obstructive pulmonary disease

## 2022-11-12 ENCOUNTER — Encounter (INDEPENDENT_AMBULATORY_CARE_PROVIDER_SITE_OTHER): Payer: Self-pay | Admitting: Ophthalmology

## 2022-11-12 ENCOUNTER — Ambulatory Visit (INDEPENDENT_AMBULATORY_CARE_PROVIDER_SITE_OTHER): Payer: Medicare Other | Admitting: Ophthalmology

## 2022-11-12 DIAGNOSIS — I10 Essential (primary) hypertension: Secondary | ICD-10-CM | POA: Diagnosis not present

## 2022-11-12 DIAGNOSIS — E119 Type 2 diabetes mellitus without complications: Secondary | ICD-10-CM

## 2022-11-12 DIAGNOSIS — H34831 Tributary (branch) retinal vein occlusion, right eye, with macular edema: Secondary | ICD-10-CM | POA: Diagnosis not present

## 2022-11-12 DIAGNOSIS — H25813 Combined forms of age-related cataract, bilateral: Secondary | ICD-10-CM

## 2022-11-12 DIAGNOSIS — H35033 Hypertensive retinopathy, bilateral: Secondary | ICD-10-CM

## 2022-11-12 MED ORDER — AFLIBERCEPT 2MG/0.05ML IZ SOLN FOR KALEIDOSCOPE
2.0000 mg | INTRAVITREAL | Status: AC | PRN
  Administered 2022-11-12: 2 mg via INTRAVITREAL

## 2022-12-25 ENCOUNTER — Encounter (INDEPENDENT_AMBULATORY_CARE_PROVIDER_SITE_OTHER): Payer: Medicare Other | Admitting: Vascular Surgery

## 2023-01-01 NOTE — Progress Notes (Signed)
[facility] Note  01/07/2023     CHIEF COMPLAINT Patient presents for Retina Follow Up  HISTORY OF PRESENT ILLNESS: Kurt [name] is a 72 y.o. male who presents to the clinic today for:  HPI     Retina Follow Up   Patient presents with  CRVO/BRVO.  In right eye.  This started 8 weeks ago.  Duration of 8 weeks.  Since onset it is stable.  I, the attending physician,  performed the HPI with the patient and updated documentation appropriately.        Comments   8 week retina eval BRVO OD and I'VE OD pt states vision seems little blurred today he denies flashes or floaters       Last edited by Bernarda Caffey, MD on 01/07/2023  5:43 PM.     Referring physician:  Gevena Cotton, MD [address] [place],  [place] [address]  HISTORICAL INFORMATION:   Selected notes from the MEDICAL RECORD NUMBER Diabetic Eval per Dr. Frederico Hamman   CURRENT MEDICATIONS: No current outpatient medications on file. (Ophthalmic Drugs)   No current facility-administered medications for this visit. (Ophthalmic Drugs)   Current Outpatient Medications (Other)  Medication Sig   acetaminophen (TYLENOL) 650 MG CR tablet Take 1,300 mg by mouth every 8 (eight) hours as needed for pain.   aspirin 81 MG chewable tablet Chew 1 tablet (81 mg total) by mouth daily.   B-D ULTRAFINE III SHORT PEN 31G X 8 MM MISC Inject into the skin.   docusate sodium (COLACE) 100 MG capsule Take 100 mg by mouth 2 (two) times daily.   glipiZIDE (GLUCOTROL XL) 10 MG 24 hr tablet Take 1 tablet (10 mg total) by mouth daily with breakfast.   HYDROcodone-acetaminophen (NORCO) 7.5-325 MG tablet Take 1 tablet by mouth daily as needed for moderate pain.   ketoconazole (NIZORAL) 2 % cream Apply 1 Application topically daily.   LANTUS SOLOSTAR 100 UNIT/ML Solostar Pen Inject 34 Units into the skin at bedtime.   lisinopril (ZESTRIL) 20 MG tablet Take 20 mg by mouth daily.   methocarbamol (ROBAXIN)  500 MG tablet Take 500 mg by mouth every 8 (eight) hours as needed for muscle spasms.   Multiple Vitamin (MULTIVITAMIN) tablet Take 1 tablet by mouth daily.   Omega-3 Fatty Acids (FISH OIL) 1000 MG CAPS Take by mouth.   ONETOUCH ULTRA test strip 1 each 2 (two) times daily.   simvastatin (ZOCOR) 20 MG tablet Take 20 mg by mouth every evening.   SitaGLIPtin-MetFORMIN HCl [phone] MG TB24 Take 1 tablet by mouth at bedtime.   zinc gluconate 50 MG tablet Take 50 mg by mouth daily.   No current facility-administered medications for this visit. (Other)   REVIEW OF SYSTEMS: ROS   Positive for: Musculoskeletal, Endocrine, Eyes Negative for: Constitutional, Gastrointestinal, Neurological, Skin, Genitourinary, HENT, Cardiovascular, Respiratory, Psychiatric, Allergic/Imm, Heme/Lymph Last edited by Parthenia Ames, COT on 01/07/2023  2:31 PM.     ALLERGIES Allergies  Allergen Reactions   Augmentin [Amoxicillin-Pot Clavulanate] Nausea And Vomiting   Iodine Hives   Tape Rash    Paper    PAST MEDICAL HISTORY Past Medical History:  Diagnosis Date   Arthritis    Bronchitis    Bronchitis    Cataract    OU   Concussion    late 1990's  after a fall   GERD (gastroesophageal reflux disease)    History of kidney stones    Hypertension  Hypertensive retinopathy    OU   Osteogenesis imperfecta    PONV (postoperative nausea and vomiting)    pt has post polio syndrome   Post-polio syndrome    Type 2 diabetes mellitus (HCC) 12/25/2018   Past Surgical History:  Procedure Laterality Date   ANKLE FRACTURE SURGERY Bilateral    arm surgery Right    nerve surgery   COLONOSCOPY     ELBOW FRACTURE SURGERY Left    EYE MUSCLE SURGERY Left    LEG SURGERY Left    femur fracture with rod   REVERSE SHOULDER ARTHROPLASTY Right 11/10/2019   Procedure: RIGHT REVERSE SHOULDER ARTHROPLASTY;  Surgeon: Cammy Copa, MD;  Location: [facility] OR;  Service: Orthopedics;  Laterality: Right;   TONSILLECTOMY      FAMILY HISTORY Family History  Problem Relation Age of Onset   Hypertension Mother    Diabetes Brother    SOCIAL HISTORY Social History   Tobacco Use   Smoking status: Never   Smokeless tobacco: Never  Vaping Use   Vaping Use: Never used  Substance Use Topics   Alcohol use: Yes    Comment: 1 beer occasionally   Drug use: No       OPHTHALMIC EXAM: Base Eye Exam     Visual Acuity (Snellen - Linear)       Right Left   Dist cc 20/100 20/25   Dist ph cc 20/40     Correction: Glasses         Tonometry (Tonopen, 2:37 PM)       Right Left   Pressure 15 14         Pupils       Pupils Dark Light Shape React APD   Right PERRL 3 2 Round Brisk None   Left PERRL 3 2 Round Brisk None         Visual Fields       Left Right    Full Full         Extraocular Movement       Right Left    Full, Ortho Full, Ortho         Neuro/Psych     Oriented x3: Yes   Mood/Affect: Normal         Dilation     Both eyes: 2.5% Phenylephrine @ 2:36 PM           Slit Lamp and Fundus Exam     Slit Lamp Exam       Right Left   Lids/Lashes Dermatochalasis - upper lid, mild Meibomian gland dysfunction Dermatochalasis - upper lid, Dermatochalasis - lower lid, mild Meibomian gland dysfunction   Conjunctiva/Sclera blue sclera blue sclera   Cornea arcus, 1+ PEE, mild tear film debris arcus, SCH   Anterior Chamber deep and clear deep and clear   Iris round and dilated, no NVI round and dilated, no NVI   Lens 2-3+ NS w/ brunescence, 2-3+CS, +oil droplet 2-3+ NS w/ brunesecence, 2-3+CS   Anterior Vitreous mild syneresis, PVD, vitreous condensations mild syneresis         Fundus Exam       Right Left   Disc pink and sharp pink and sharp   C/D Ratio 0.5 0.4   Macula Good foveal reflex; trace persistent edema SN macula -- improved, mild focal laser changes, no heme flat, good foveal reflex, mild RPE mottling and clumping, +drusen, No heme or edema   Vessels  attenuated, Tortuous attenuated, Tortuous   Periphery  attached, no heme attached, no heme           Refraction     Wearing Rx       Sphere Cylinder Axis Add   Right -2.50 +2.00 105 +2.75   Left -1.50 +1.75 101 +2.75           IMAGING AND PROCEDURES  Imaging and Procedures for  OCT, Retina - OU - Both Eyes       Right Eye Quality was good. Central Foveal Thickness: 237. Progression has improved. Findings include normal foveal contour, no SRF, retinal drusen , intraretinal fluid, outer retinal atrophy (interval improvement in IRF SN macula).   Left Eye Quality was good. Central Foveal Thickness: 259. Progression has been stable. Findings include normal foveal contour, no IRF, no SRF, retinal drusen , vitreomacular adhesion (Focal PED/drusen).   Notes *Images captured and stored on drive  Diagnosis / Impression:  OD: BRVO with mild interval improvement in IRF SN macula OS: NFP, no SRF/IRF, drusen, VMA  Clinical management:  See below  Abbreviations: NFP - Normal foveal profile. CME - cystoid macular edema. PED - pigment epithelial detachment. IRF - intraretinal fluid. SRF - subretinal fluid. EZ - ellipsoid zone. ERM - epiretinal membrane. ORA - outer retinal atrophy. ORT - outer retinal tubulation. SRHM - subretinal hyper-reflective material       Intravitreal Injection, Pharmacologic Agent - OD - Right Eye       Time Out 01/07/2023. 3:52 PM. Confirmed correct patient, procedure, site, and patient consented.   Anesthesia Topical anesthesia was used. Anesthetic medications included Lidocaine 2%, Proparacaine 0.5%.   Procedure Preparation included 5% betadine to ocular surface, eyelid speculum. A (32 g) needle was used.   Injection: 2 mg aflibercept 2 MG/0.05ML   Route: Intravitreal, Site: Right Eye   NDC: L6038910, Lot: [id number], Expiration date: 04/16/2023, Waste: 0 mL   Post-op Post injection exam found visual acuity of at least counting fingers.  The patient tolerated the procedure well. There were no complications. The patient received written and verbal post procedure care education. Post injection medications were not given.            ASSESSMENT/PLAN:   ICD-10-CM   1. 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 Essential hypertension  I10     4. Hypertensive retinopathy of both eyes  H35.033     5. Combined forms of age-related cataract of both eyes  H25.813      1. BRVO with CME OD  - s/p IVA OD #1 (01.19.21), #2 (02.16.21), #3 (03.16.21), #4 (4.13.21) -- IVA resistance             - s/p IVE OD #1 (05.11.21--sample), #2 (06.08.21), #3 (07.06.21), #4 (8.3.21), #5 (08.31.21), #6 (09.28.21), #7 (10.26.21), #8 (11.23.21), #9 (12.22.21), #10 (1.24.22), #11 (2.22.22), #12 (3.22.22), #13 (04.19.22), #14 (05.25.22), #15 (06.29.22), #16 (07.27.22), #17 (08.24.22), #18 (09.21.22), #19 (10.19.22), #20 (11.22.22), #21 (12.20.22), #22 (01.18.23), #23 (02.22.23), #24 (03.28.23), #25 (05.09.23), #26 (06.20.23), #27 (08.07.23), #28 (10.02.23), #29 (11.27.23)  - s/p focal laser OD (07.13.22)  - BCVA 20/40 from 20/30 -- suspect cataract progression  - exam with focal edema, IRH, CWS superonasal macula -- improved  - OCT shows  BRVO with mild interval improvement in IRF SN macula at 8 weeks  - recommend IVE OD #30 today (01.22.24) w/ f/u at 8 wks  - RBA of procedure discussed, questions answered  - informed consent obtained  - Avastin informed consent form re-signed and scanned on 05.09.23  - Eylea informed consent form re-signed and scanned on 05.09.23  - see procedure note  - Eylea4U benefits investigation started, 05.11.21 -- approved through [employer] for 2023  - F/U 8 wks -- DFE/OCT/possible injection, tx and  ext as able  2. Diabetes mellitus, type 2 without retinopathy OU  - The incidence, risk factors for progression, natural history and treatment options for diabetic retinopathy  were discussed with patient.    - The need for close monitoring of blood glucose, blood pressure, and serum lipids, avoiding cigarette or any type of tobacco, and the need for long term follow up was also discussed with patient.  - monitor  3,4. Hypertensive retinopathy OU  - discussed importance of tight BP control  - monitor  5. Age related cataracts OU   - The symptoms of cataract, surgical options, and treatments and risks were discussed with patient.  - discussed diagnosis and progression  - approaching visual significance, especially OD  Ophthalmic Meds Ordered this visit:  Meds ordered this encounter  Medications   aflibercept (EYLEA) SOLN 2 mg     Return in about 8 weeks (around 03/04/2023) for f/u BRVO OD, DFE, OCT.  There are no Patient Instructions on file for this visit.   This document serves as a record of services personally performed by Gardiner Sleeper, MD, PhD. It was created on their behalf by Orvan Falconer, an ophthalmic technician. The creation of this record is the provider's dictation and/or activities during the visit.    Electronically signed by: Orvan Falconer, OA, 01/07/23  5:43 PM  This document serves as a record of services personally performed by Gardiner Sleeper, MD, PhD. It was created on their behalf by San Jetty. Owens Shark, OA an ophthalmic technician. The creation of this record is the provider's dictation and/or activities during the visit.    Electronically signed by: San Jetty. Owens Shark, [place] 01.22.2024 5:43 PM  Gardiner Sleeper, M.D., Ph.D. Diseases & Surgery of the Retina and Vitreous [facility]  I have reviewed the above documentation for accuracy and completeness, and I agree with the above. Gardiner Sleeper, M.D., Ph.D. 01/07/23 5:45  PM  Abbreviations: M myopia (nearsighted); A astigmatism; H hyperopia (farsighted); P presbyopia; Mrx spectacle prescription;  CTL contact lenses; OD right eye; OS left eye; OU both eyes  XT exotropia; ET esotropia; PEK punctate epithelial keratitis; PEE punctate epithelial erosions; DES dry eye syndrome; MGD meibomian gland dysfunction; ATs artificial tears; PFAT's preservative free artificial tears; [place] nuclear sclerotic cataract; PSC posterior subcapsular cataract; ERM epi-retinal membrane; PVD posterior vitreous detachment; RD retinal detachment; DM diabetes mellitus; DR diabetic retinopathy; NPDR non-proliferative diabetic retinopathy; PDR proliferative diabetic retinopathy; CSME clinically significant macular edema; DME diabetic macular edema; dbh dot blot hemorrhages; CWS cotton wool spot; POAG primary open angle glaucoma; C/D cup-to-disc ratio; HVF humphrey visual field; GVF goldmann visual field; OCT optical coherence tomography; IOP intraocular pressure; BRVO Branch retinal vein occlusion; CRVO central retinal vein occlusion; CRAO central retinal artery occlusion; BRAO branch retinal artery occlusion; RT retinal tear; SB scleral buckle; PPV pars plana vitrectomy; VH Vitreous hemorrhage; PRP panretinal laser photocoagulation; IVK intravitreal kenalog; VMT vitreomacular traction; MH Macular hole;  NVD neovascularization of the disc; NVE neovascularization elsewhere; AREDS age related eye disease study; ARMD age related macular degeneration; POAG primary open angle glaucoma; EBMD epithelial/anterior basement membrane dystrophy; ACIOL anterior chamber intraocular lens; IOL intraocular lens; PCIOL posterior chamber intraocular lens; Phaco/IOL phacoemulsification with intraocular lens placement; [place] photorefractive keratectomy; LASIK laser assisted in situ keratomileusis; HTN hypertension; DM diabetes mellitus; COPD chronic obstructive pulmonary disease

## 2023-01-07 ENCOUNTER — Encounter (INDEPENDENT_AMBULATORY_CARE_PROVIDER_SITE_OTHER): Payer: Self-pay | Admitting: Ophthalmology

## 2023-01-07 ENCOUNTER — Ambulatory Visit (INDEPENDENT_AMBULATORY_CARE_PROVIDER_SITE_OTHER): Payer: Medicare Other | Admitting: Ophthalmology

## 2023-01-07 DIAGNOSIS — I10 Essential (primary) hypertension: Secondary | ICD-10-CM

## 2023-01-07 DIAGNOSIS — H35033 Hypertensive retinopathy, bilateral: Secondary | ICD-10-CM | POA: Diagnosis not present

## 2023-01-07 DIAGNOSIS — H34831 Tributary (branch) retinal vein occlusion, right eye, with macular edema: Secondary | ICD-10-CM | POA: Diagnosis not present

## 2023-01-07 DIAGNOSIS — E119 Type 2 diabetes mellitus without complications: Secondary | ICD-10-CM

## 2023-01-07 DIAGNOSIS — H25813 Combined forms of age-related cataract, bilateral: Secondary | ICD-10-CM

## 2023-01-07 MED ORDER — AFLIBERCEPT 2MG/0.05ML IZ SOLN FOR KALEIDOSCOPE
2.0000 mg | INTRAVITREAL | Status: AC | PRN
  Administered 2023-01-07: 2 mg via INTRAVITREAL

## 2023-02-20 NOTE — Progress Notes (Signed)
[facility] Note  03/04/2023     CHIEF COMPLAINT Patient presents for Retina Follow Up  HISTORY OF PRESENT ILLNESS: Kurt [name] is a 72 y.o. male who presents to the clinic today for:  HPI     Retina Follow Up   Patient presents with  CRVO/BRVO.  In right eye.  This started 8 weeks ago.  I, the attending physician,  performed the HPI with the patient and updated documentation appropriately.        Comments   Patient here for 8 weeks retina follow up for BRVO OD. Patient states vision can't see good out of OD. Has a cataract. Has an appointment with Dr Bing Plume for cataract evaluation April 10th. OS is good. No eye pain. Itches sometimes.      Last edited by Bernarda Caffey, MD on 03/04/2023  3:25 PM.    Pt states he has an appt to see Dr. Bing Plume about his cataracts, Dr. Frederico Hamman gave him a new rx for glasses, but he is not going to get it filled until he sees Dr. Bing Plume, he states left eye vision is fine, OD is fuzzy  Referring physician:  [facility],  [place] [address]  HISTORICAL INFORMATION:   Selected notes from the MEDICAL RECORD NUMBER Diabetic Eval per Dr. Frederico Hamman   CURRENT MEDICATIONS: No current outpatient medications on file. (Ophthalmic Drugs)   No current facility-administered medications for this visit. (Ophthalmic Drugs)   Current Outpatient Medications (Other)  Medication Sig   acetaminophen (TYLENOL) 650 MG CR tablet Take 1,300 mg by mouth every 8 (eight) hours as needed for pain.   aspirin 81 MG chewable tablet Chew 1 tablet (81 mg total) by mouth daily.   B-D ULTRAFINE III SHORT PEN 31G X 8 MM MISC Inject into the skin.   docusate sodium (COLACE) 100 MG capsule Take 100 mg by mouth 2 (two) times daily.   glipiZIDE (GLUCOTROL XL) 10 MG 24 hr tablet Take 1 tablet (10 mg total) by mouth daily with breakfast.   HYDROcodone-acetaminophen (NORCO) 7.5-325 MG tablet Take 1 tablet by mouth  daily as needed for moderate pain.   ketoconazole (NIZORAL) 2 % cream Apply 1 Application topically daily.   LANTUS SOLOSTAR 100 UNIT/ML Solostar Pen Inject 34 Units into the skin at bedtime.   lisinopril (ZESTRIL) 20 MG tablet Take 20 mg by mouth daily.   methocarbamol (ROBAXIN) 500 MG tablet Take 500 mg by mouth every 8 (eight) hours as needed for muscle spasms.   Multiple Vitamin (MULTIVITAMIN) tablet Take 1 tablet by mouth daily.   Omega-3 Fatty Acids (FISH OIL) 1000 MG CAPS Take by mouth.   ONETOUCH ULTRA test strip 1 each 2 (two) times daily.   simvastatin (ZOCOR) 20 MG tablet Take 20 mg by mouth every evening.   SitaGLIPtin-MetFORMIN HCl 325-106-6666 MG TB24 Take 1 tablet by mouth at bedtime.   zinc gluconate 50 MG tablet Take 50 mg by mouth daily.   No current facility-administered medications for this visit. (Other)   REVIEW OF SYSTEMS: ROS   Positive for: Musculoskeletal, Endocrine, Eyes Negative for: Constitutional, Gastrointestinal, Neurological, Skin, Genitourinary, HENT, Cardiovascular, Respiratory, Psychiatric, Allergic/Imm, Heme/Lymph Last edited by Theodore Demark, COA on 03/04/2023  3:13 PM.      ALLERGIES Allergies  Allergen Reactions   Augmentin [Amoxicillin-Pot Clavulanate] Nausea And Vomiting   Iodine Hives   Tape Rash    Paper    PAST  MEDICAL HISTORY Past Medical History:  Diagnosis Date   Arthritis    Bronchitis    Bronchitis    Cataract    OU   Concussion    late 1990's  after a fall   GERD (gastroesophageal reflux disease)    History of kidney stones    Hypertension    Hypertensive retinopathy    OU   Osteogenesis imperfecta    PONV (postoperative nausea and vomiting)    pt has post polio syndrome   Post-polio syndrome    Type 2 diabetes mellitus ([place]) 12/25/2018   Past Surgical History:  Procedure Laterality Date   ANKLE FRACTURE SURGERY Bilateral    arm surgery Right    nerve surgery   COLONOSCOPY     ELBOW FRACTURE SURGERY Left    EYE  MUSCLE SURGERY Left    LEG SURGERY Left    femur fracture with rod   REVERSE SHOULDER ARTHROPLASTY Right 11/10/2019   Procedure: RIGHT REVERSE SHOULDER ARTHROPLASTY;  Surgeon: Meredith Pel, MD;  Location: [facility];  Service: Orthopedics;  Laterality: Right;   TONSILLECTOMY     FAMILY HISTORY Family History  Problem Relation Age of Onset   Hypertension Mother    Diabetes Brother    SOCIAL HISTORY Social History   Tobacco Use   Smoking status: Never   Smokeless tobacco: Never  Vaping Use   Vaping Use: Never used  Substance Use Topics   Alcohol use: Yes    Comment: 1 beer occasionally   Drug use: No       OPHTHALMIC EXAM: Base Eye Exam     Visual Acuity (Snellen - Linear)       Right Left   Dist cc 20/150 20/20 -1   Dist ph cc 20/30 -1     Correction: Glasses         Tonometry (Tonopen, 3:10 PM)       Right Left   Pressure 07 11         Pupils       Dark Light Shape React APD   Right 3 2 Round Brisk None   Left 3 2 Round Brisk None         Visual Fields (Counting fingers)       Left Right    Full Full         Extraocular Movement       Right Left    Full, Ortho Full, Ortho         Neuro/Psych     Oriented x3: Yes   Mood/Affect: Normal         Dilation     Both eyes: 1.0% Mydriacyl, 2.5% Phenylephrine @ 3:10 PM           Slit Lamp and Fundus Exam     Slit Lamp Exam       Right Left   Lids/Lashes Dermatochalasis - upper lid, mild Meibomian gland dysfunction Dermatochalasis - upper lid, Dermatochalasis - lower lid, mild Meibomian gland dysfunction   Conjunctiva/Sclera blue sclera blue sclera   Cornea arcus, 1+ PEE, mild tear film debris arcus, tear film debris   Anterior Chamber deep and clear deep and clear   Iris round and dilated, no NVI round and dilated, no NVI   Lens 3+ NS w/ brunescence, 3+CS, +oil droplet 2-3+ NS w/ brunesecence, 2-3+CS   Anterior Vitreous mild syneresis, PVD, vitreous condensations mild  syneresis         Fundus Exam  Right Left   Disc pink and sharp pink and sharp   C/D Ratio 0.5 0.4   Macula Good foveal reflex; trace persistent edema SN macula -- improved, mild focal laser changes, no heme flat, good foveal reflex, RPE mottling and clumping, fine drusen, No heme or edema   Vessels attenuated, Tortuous attenuated, Tortuous   Periphery attached, no heme attached, no heme           Refraction     Wearing Rx       Sphere Cylinder Axis Add   Right -2.50 +2.00 105 +2.75   Left -1.50 +1.75 101 +2.75           IMAGING AND PROCEDURES  Imaging and Procedures for  OCT, Retina - OU - Both Eyes       Right Eye Quality was good. Central Foveal Thickness: 238. Progression has improved. Findings include normal foveal contour, no SRF, retinal drusen , intraretinal fluid, outer retinal atrophy (interval improvement in IRF SN macula).   Left Eye Quality was good. Central Foveal Thickness: 263. Progression has been stable. Findings include normal foveal contour, no IRF, no SRF, retinal drusen , vitreomacular adhesion (Focal PED/drusen temporal fovea).   Notes *Images captured and stored on drive  Diagnosis / Impression:  OD: BRVO with mild interval improvement in IRF SN macula OS: NFP, no SRF/IRF, drusen, VMA  Clinical management:  See below  Abbreviations: NFP - Normal foveal profile. CME - cystoid macular edema. PED - pigment epithelial detachment. IRF - intraretinal fluid. SRF - subretinal fluid. EZ - ellipsoid zone. ERM - epiretinal membrane. ORA - outer retinal atrophy. ORT - outer retinal tubulation. SRHM - subretinal hyper-reflective material       Intravitreal Injection, Pharmacologic Agent - OD - Right Eye       Time Out 03/04/2023. 3:28 PM. Confirmed correct patient, procedure, site, and patient consented.   Anesthesia Topical anesthesia was used. Anesthetic medications included Lidocaine 2%, Proparacaine 0.5%.   Procedure Preparation  included 5% betadine to ocular surface, eyelid speculum. A (32 g) needle was used.   Injection: 2 mg aflibercept 2 MG/0.05ML   Route: Intravitreal, Site: Right Eye   NDC: A3590391, Lot: [id number], Expiration date: 10/17/2023, Waste: 0 mL   Post-op Post injection exam found visual acuity of at least counting fingers. The patient tolerated the procedure well. There were no complications. The patient received written and verbal post procedure care education. Post injection medications were not given.            ASSESSMENT/PLAN:   ICD-10-CM   1. 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 Diabetes mellitus type 2 without retinopathy ([place])  E11.9     3. Essential hypertension  I10     4. Hypertensive retinopathy of both eyes  H35.033     5. Combined forms of age-related cataract of both eyes  H25.813       1. BRVO with CME OD  - s/p IVA OD #1 (01.19.21), #2 (02.16.21), #3 (03.16.21), #4 (4.13.21) -- IVA resistance             - s/p IVE OD #1 (05.11.21--sample), #2 (06.08.21), #3 (07.06.21), #4 (8.3.21), #5 (08.31.21), #6 (09.28.21), #7 (10.26.21), #8 (11.23.21), #9 (12.22.21), #10 (1.24.22), #11 (2.22.22), #12 (3.22.22), #13 (04.19.22), #14 (05.25.22), #  15 (06.29.22), #16 (07.27.22), #17 (08.24.22), #18 (09.21.22), #19 (10.19.22), #20 (11.22.22), #21 (12.20.22), #22 (01.18.23), #23 (02.22.23), #24 (03.28.23), #25 (05.09.23), #26 (06.20.23), #27 (08.07.23), #28 (10.02.23), #29 (11.27.23), #30 (01.22.24)  - s/p focal laser OD (07.13.22)  - BCVA stable at 20/30   - exam with focal edema, IRH, CWS superonasal macula -- improved  - OCT shows BRVO with mild interval improvement in IRF SN macula at 8 weeks  - recommend IVE OD #31 today (03.18.24) w/ f/u at 8 wks  - RBA of procedure discussed, questions answered  - informed  consent obtained  - Avastin informed consent form re-signed and scanned on 05.09.23  - Eylea informed consent form re-signed and scanned on 05.09.23  - see procedure note  - Eylea4U benefits investigation started, 05.11.21 -- approved through [employer] for 2023  - F/U 8 wks -- DFE/OCT/possible injection; pt may benefit from repeat focal laser OD  2. Diabetes mellitus, type 2 without retinopathy OU  - The incidence, risk factors for progression, natural history and treatment options for diabetic retinopathy  were discussed with patient.    - The need for close monitoring of blood glucose, blood pressure, and serum lipids, avoiding cigarette or any type of tobacco, and the need for long term follow up was also discussed with patient.  - monitor  3,4. Hypertensive retinopathy OU  - discussed importance of tight BP control  - monitor  5. Age related cataracts OU   - The symptoms of cataract, surgical options, and treatments and risks were discussed with patient.  - discussed diagnosis and progression  - approaching visual significance  - has cat consult scheduled with Dr. Eulas Post next month  Ophthalmic Meds Ordered this visit:  Meds ordered this encounter  Medications   aflibercept (EYLEA) SOLN 2 mg     Return in about 8 weeks (around 04/29/2023) for f/u BRVO OD, DFE, OCT.  There are no Patient Instructions on file for this visit.   This document serves as a record of services personally performed by Gardiner Sleeper, MD, PhD. It was created on their behalf by Orvan Falconer, an ophthalmic technician. The creation of this record is the provider's dictation and/or activities during the visit.    Electronically signed by: Orvan Falconer, OA, 03/04/23  3:59 PM  This document serves as a record of services personally performed by Gardiner Sleeper, MD, PhD. It was created on their behalf by San Jetty. Owens Shark, OA an ophthalmic technician. The creation of this record is the provider's  dictation and/or activities during the visit.    Electronically signed by: San Jetty. Owens Shark, [place] 03.18.2024 3:59 PM  Gardiner Sleeper, M.D., Ph.D. Diseases & Surgery of the Retina and Vitreous [facility]  I have reviewed the above documentation for accuracy and completeness, and I agree with the above. Gardiner Sleeper, M.D., Ph.D. 03/04/23 4:00 PM   Abbreviations: M myopia (nearsighted); A astigmatism; H hyperopia (farsighted); P presbyopia; Mrx spectacle prescription;  CTL contact lenses; OD right eye; OS left eye; OU both eyes  XT exotropia; ET esotropia; PEK punctate epithelial keratitis; PEE punctate epithelial erosions; DES dry eye syndrome; MGD meibomian gland dysfunction; ATs artificial tears; PFAT's preservative free artificial tears; Felt nuclear sclerotic cataract; PSC posterior subcapsular cataract; ERM epi-retinal membrane; PVD posterior vitreous detachment; RD retinal detachment; DM diabetes mellitus; DR diabetic retinopathy; NPDR non-proliferative diabetic retinopathy; PDR proliferative diabetic retinopathy; CSME clinically significant macular edema; DME diabetic macular edema; dbh dot blot  hemorrhages; CWS cotton wool spot; POAG primary open angle glaucoma; C/D cup-to-disc ratio; HVF humphrey visual field; GVF goldmann visual field; OCT optical coherence tomography; IOP intraocular pressure; BRVO Branch retinal vein occlusion; CRVO central retinal vein occlusion; CRAO central retinal artery occlusion; BRAO branch retinal artery occlusion; RT retinal tear; SB scleral buckle; PPV pars plana vitrectomy; VH Vitreous hemorrhage; PRP panretinal laser photocoagulation; IVK intravitreal kenalog; VMT vitreomacular traction; MH Macular hole;  NVD neovascularization of the disc; NVE neovascularization elsewhere; AREDS age related eye disease study; ARMD age related macular degeneration; POAG primary open angle glaucoma; EBMD epithelial/anterior basement membrane dystrophy; ACIOL  anterior chamber intraocular lens; IOL intraocular lens; PCIOL posterior chamber intraocular lens; Phaco/IOL phacoemulsification with intraocular lens placement; [place] photorefractive keratectomy; LASIK laser assisted in situ keratomileusis; HTN hypertension; DM diabetes mellitus; COPD chronic obstructive pulmonary disease

## 2023-03-04 ENCOUNTER — Ambulatory Visit (INDEPENDENT_AMBULATORY_CARE_PROVIDER_SITE_OTHER): Payer: Medicare Other | Admitting: Ophthalmology

## 2023-03-04 ENCOUNTER — Encounter (INDEPENDENT_AMBULATORY_CARE_PROVIDER_SITE_OTHER): Payer: Self-pay | Admitting: Ophthalmology

## 2023-03-04 DIAGNOSIS — H34831 Tributary (branch) retinal vein occlusion, right eye, with macular edema: Secondary | ICD-10-CM | POA: Diagnosis not present

## 2023-03-04 DIAGNOSIS — E119 Type 2 diabetes mellitus without complications: Secondary | ICD-10-CM

## 2023-03-04 DIAGNOSIS — H25813 Combined forms of age-related cataract, bilateral: Secondary | ICD-10-CM

## 2023-03-04 DIAGNOSIS — H35033 Hypertensive retinopathy, bilateral: Secondary | ICD-10-CM

## 2023-03-04 DIAGNOSIS — I10 Essential (primary) hypertension: Secondary | ICD-10-CM

## 2023-03-04 MED ORDER — AFLIBERCEPT 2MG/0.05ML IZ SOLN FOR KALEIDOSCOPE
2.0000 mg | INTRAVITREAL | Status: AC | PRN
  Administered 2023-03-04: 2 mg via INTRAVITREAL

## 2023-04-24 NOTE — Progress Notes (Signed)
[facility] - Clinic Note  04/29/2023     CHIEF COMPLAINT Patient presents for Retina Follow Up  HISTORY OF PRESENT ILLNESS: Kurt [name] is a 72 y.o. male who presents to the clinic today for:  HPI     Retina Follow Up   Patient presents with  CRVO/BRVO.  In both eyes.  This started 8 weeks ago.  Duration of 8 weeks.  Since onset it is stable.  I, the attending physician,  performed the HPI with the patient and updated documentation appropriately.        Comments   8 week retina follow up BRVO OD and I'VE OD pt is reporting no vision changes noticed he is having cat surgery with Dr. Sherrine Maples June 4 OD and June 26th OS pt denies any flashes or floaters last reading was 136 on Saturday       Last edited by Rennis Chris, MD on 04/29/2023  4:27 PM.    Pt has cataract sx OD scheduled for June 4th with Dr. Sherrine Maples, [facility] is scheduled for June 26th   Referring physician:  Shon Millet, MD [address] [facility] [place],  [place] [address]  HISTORICAL INFORMATION:   Selected notes from the MEDICAL RECORD NUMBER Diabetic Eval per Dr. Karleen Hampshire   CURRENT MEDICATIONS: No current outpatient medications on file. (Ophthalmic Drugs)   No current facility-administered medications for this visit. (Ophthalmic Drugs)   Current Outpatient Medications (Other)  Medication Sig   acetaminophen (TYLENOL) 650 MG CR tablet Take 1,300 mg by mouth every 8 (eight) hours as needed for pain.   aspirin 81 MG chewable tablet Chew 1 tablet (81 mg total) by mouth daily.   B-D ULTRAFINE III SHORT PEN 31G X 8 MM MISC Inject into the skin.   docusate sodium (COLACE) 100 MG capsule Take 100 mg by mouth 2 (two) times daily.   glipiZIDE (GLUCOTROL XL) 10 MG 24 hr tablet Take 1 tablet (10 mg total) by mouth daily with breakfast.   HYDROcodone-acetaminophen (NORCO) 7.5-325 MG tablet Take 1 tablet by mouth daily as needed for moderate pain.   ketoconazole (NIZORAL) 2 % cream Apply 1 Application  topically daily.   LANTUS SOLOSTAR 100 UNIT/ML Solostar Pen Inject 34 Units into the skin at bedtime.   lisinopril (ZESTRIL) 20 MG tablet Take 20 mg by mouth daily.   methocarbamol (ROBAXIN) 500 MG tablet Take 500 mg by mouth every 8 (eight) hours as needed for muscle spasms.   Multiple Vitamin (MULTIVITAMIN) tablet Take 1 tablet by mouth daily.   Omega-3 Fatty Acids (FISH OIL) 1000 MG CAPS Take by mouth.   ONETOUCH ULTRA test strip 1 each 2 (two) times daily.   simvastatin (ZOCOR) 20 MG tablet Take 20 mg by mouth every evening.   SitaGLIPtin-MetFORMIN HCl 225 501 3223 MG TB24 Take 1 tablet by mouth at bedtime.   zinc gluconate 50 MG tablet Take 50 mg by mouth daily.   No current facility-administered medications for this visit. (Other)   REVIEW OF SYSTEMS: ROS   Positive for: Musculoskeletal, Endocrine, Eyes Negative for: Constitutional, Gastrointestinal, Neurological, Skin, Genitourinary, HENT, Cardiovascular, Respiratory, Psychiatric, Allergic/Imm, Heme/Lymph Last edited by Etheleen Mayhew, COT on 04/29/2023  2:49 PM.       ALLERGIES Allergies  Allergen Reactions   Augmentin [Amoxicillin-Pot Clavulanate] Nausea And Vomiting   Iodine Hives   Tape Rash    Paper    PAST MEDICAL HISTORY Past Medical History:  Diagnosis Date   Arthritis  Bronchitis    Bronchitis    Cataract    OU   Concussion    late 1990's  after a fall   GERD (gastroesophageal reflux disease)    History of kidney stones    Hypertension    Hypertensive retinopathy    OU   Osteogenesis imperfecta    PONV (postoperative nausea and vomiting)    pt has post polio syndrome   Post-polio syndrome    Type 2 diabetes mellitus (HCC) 12/25/2018   Past Surgical History:  Procedure Laterality Date   ANKLE FRACTURE SURGERY Bilateral    arm surgery Right    nerve surgery   COLONOSCOPY     ELBOW FRACTURE SURGERY Left    EYE MUSCLE SURGERY Left    LEG SURGERY Left    femur fracture with rod   REVERSE  SHOULDER ARTHROPLASTY Right 11/10/2019   Procedure: RIGHT REVERSE SHOULDER ARTHROPLASTY;  Surgeon: Cammy Copa, MD;  Location: [facility] OR;  Service: Orthopedics;  Laterality: Right;   TONSILLECTOMY     FAMILY HISTORY Family History  Problem Relation Age of Onset   Hypertension Mother    Diabetes Brother    SOCIAL HISTORY Social History   Tobacco Use   Smoking status: Never   Smokeless tobacco: Never  Vaping Use   Vaping Use: Never used  Substance Use Topics   Alcohol use: Yes    Comment: 1 beer occasionally   Drug use: No       OPHTHALMIC EXAM: Base Eye Exam     Visual Acuity (Snellen - Linear)       Right Left   Dist cc 20/150 20/25   Dist ph cc 20/40 -2     Correction: Glasses         Tonometry (Tonopen, 2:53 PM)       Right Left   Pressure 8 12         Pupils       Pupils Dark Light Shape React APD   Right PERRL 3 2 Round Brisk None   Left PERRL 3 2 Round Brisk None         Visual Fields       Left Right    Full Full         Extraocular Movement       Right Left    Full, Ortho Full, Ortho         Neuro/Psych     Oriented x3: Yes   Mood/Affect: Normal         Dilation     Both eyes: 2.5% Phenylephrine @ 2:53 PM           Slit Lamp and Fundus Exam     Slit Lamp Exam       Right Left   Lids/Lashes Dermatochalasis - upper lid, mild Meibomian gland dysfunction Dermatochalasis - upper lid, Dermatochalasis - lower lid, mild Meibomian gland dysfunction   Conjunctiva/Sclera blue sclera blue sclera   Cornea arcus, 1+ PEE, mild tear film debris arcus, tear film debris   Anterior Chamber deep and clear deep and clear   Iris round and dilated, no NVI round and dilated, no NVI   Lens 3+ NS w/ brunescence, 3+CS, +oil droplet 2-3+ NS w/ brunesecence, 2-3+CS   Anterior Vitreous mild syneresis, PVD, vitreous condensations mild syneresis         Fundus Exam       Right Left   Disc pink and sharp pink and sharp  C/D Ratio  0.5 0.4   Macula Good foveal reflex; trace persistent edema SN macula -- improved, mild focal laser changes, no heme flat, good foveal reflex, RPE mottling and clumping, fine drusen, No heme or edema   Vessels attenuated, mild tortuosity attenuated, Tortuous   Periphery attached, no heme attached, no heme           Refraction     Wearing Rx       Sphere Cylinder Axis Add   Right -2.50 +2.00 105 +2.75   Left -1.50 +1.75 101 +2.75           IMAGING AND PROCEDURES  Imaging and Procedures for  OCT, Retina - OU - Both Eyes       Right Eye Quality was good. Central Foveal Thickness: 232. Progression has been stable. Findings include normal foveal contour, no SRF, retinal drusen , intraretinal fluid, outer retinal atrophy (Persistent IRF/cystic changes SN macula).   Left Eye Quality was good. Central Foveal Thickness: 266. Progression has been stable. Findings include normal foveal contour, no IRF, no SRF, retinal drusen , vitreomacular adhesion (Focal PED/drusen temporal fovea).   Notes *Images captured and stored on drive  Diagnosis / Impression:  OD: BRVO with Persistent IRF/cystic changes SN macula OS: NFP, no SRF/IRF, drusen, VMA  Clinical management:  See below  Abbreviations: NFP - Normal foveal profile. CME - cystoid macular edema. PED - pigment epithelial detachment. IRF - intraretinal fluid. SRF - subretinal fluid. EZ - ellipsoid zone. ERM - epiretinal membrane. ORA - outer retinal atrophy. ORT - outer retinal tubulation. SRHM - subretinal hyper-reflective material       Intravitreal Injection, Pharmacologic Agent - OD - Right Eye       Time Out 04/29/2023. 3:16 PM. Confirmed correct patient, procedure, site, and patient consented.   Anesthesia Topical anesthesia was used. Anesthetic medications included Lidocaine 2%, Proparacaine 0.5%.   Procedure Preparation included 5% betadine to ocular surface, eyelid speculum. A (32 g) needle was used.    Injection: 2 mg aflibercept 2 MG/0.05ML   Route: Intravitreal, Site: Right Eye   NDC: L6038910, Lot: [id number], Expiration date: 04/13/2024, Waste: 0 mL   Post-op Post injection exam found visual acuity of at least counting fingers. The patient tolerated the procedure well. There were no complications. The patient received written and verbal post procedure care education. Post injection medications were not given.            ASSESSMENT/PLAN:   ICD-10-CM   1. 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 Current use of insulin (HCC)  Z79.4     4. Long term (current) use of oral hypoglycemic drugs  Z79.84     5. Essential hypertension  I10     6. Hypertensive retinopathy of both eyes  H35.033     7. Combined forms of age-related cataract of both eyes  H25.813      1. BRVO with CME OD  - s/p IVA OD #1 (01.19.21), #2 (02.16.21), #3 (03.16.21), #4 (4.13.21) -- IVA resistance             - s/p IVE OD #1 (05.11.21--sample), #2 (06.08.21), #3 (07.06.21), #4 (8.3.21), #5 (08.31.21), #6 (09.28.21), #7 (10.26.21), #8 (  11.23.21), #9 (12.22.21), #10 (1.24.22), #11 (2.22.22), #12 (3.22.22), #13 (04.19.22), #14 (05.25.22), #15 (06.29.22), #16 (07.27.22), #17 (08.24.22), #18 (09.21.22), #19 (10.19.22), #20 (11.22.22), #21 (12.20.22), #22 (01.18.23), #23 (02.22.23), #24 (03.28.23), #25 (05.09.23), #26 (06.20.23), #27 (08.07.23), #28 (10.02.23), #29 (11.27.23), #30 (01.22.24), #31 (03.18.24)  - s/p focal laser OD (07.13.22)  - BCVA OD decreased to 20/40 from 20/30 (cataract progression)  - exam with focal edema, IRH, CWS superonasal macula -- improved  - OCT shows BRVO with persistent IRF/cystic changes SN macula at 8 weeks  - recommend IVE OD #32 today (05.13.24) w/  f/u at 8 wks  - RBA of procedure discussed, questions answered  - informed consent obtained  - Avastin informed consent form re-signed and scanned on 05.09.23  - Eylea informed consent form re-signed and scanned on 05.09.23  - see procedure note  - Eylea4U benefits investigation started, 05.11.21 -- approved through [employer] for 2023  - F/U 8 wks -- DFE/OCT/possible injection; pt may benefit from repeat focal laser OD  2-4. Diabetes mellitus, type 2 without retinopathy OU  - The incidence, risk factors for progression, natural history and treatment options for diabetic retinopathy  were discussed with patient.    - The need for close monitoring of blood glucose, blood pressure, and serum lipids, avoiding cigarette or any type of tobacco, and the need for long term follow up was also discussed with patient.  - monitor  5,6. Hypertensive retinopathy OU  - discussed importance of tight BP control  - monitor  7. Age related cataracts OU   - The symptoms of cataract, surgical options, and treatments and risks were discussed with patient.  - discussed diagnosis and progression  - under the expert management of Dr. Sherrine Maples  - surgery scheduled for OD June 4th, [facility] June 25th with Dr. Sherrine Maples - clear from a retina standpoint to proceed with cataract surgery when pt and surgeon are ready   Ophthalmic Meds Ordered this visit:  Meds ordered this encounter  Medications   aflibercept (EYLEA) SOLN 2 mg     Return in about 8 weeks (around 06/24/2023) for f/u BRVO OD, DFE, OCT.  There are no Patient Instructions on file for this visit.   This document serves as a record of services personally performed by Karie Chimera, MD, PhD. It was created on their behalf by De Blanch, an ophthalmic technician. The creation of this record is the provider's dictation and/or activities during the visit.    Electronically signed by: De Blanch, OA, 04/29/23  4:28 PM  This document serves as a record  of services personally performed by Karie Chimera, MD, PhD. It was created on their behalf by Glee Arvin. Manson Passey, OA an ophthalmic technician. The creation of this record is the provider's dictation and/or activities during the visit.    Electronically signed by: Glee Arvin. Manson Passey, [place] 05.13.2024 4:28 PM  Karie Chimera, M.D., Ph.D. Diseases & Surgery of the Retina and Vitreous [facility]  I have reviewed the above documentation for accuracy and completeness, and I agree with the above. Karie Chimera, M.D., Ph.D. 04/29/23 4:30 PM  Abbreviations: M myopia (nearsighted); A astigmatism; H hyperopia (farsighted); P presbyopia; Mrx spectacle prescription;  CTL contact lenses; OD right eye; OS left eye; OU both eyes  XT exotropia; ET esotropia; PEK punctate epithelial keratitis; PEE punctate epithelial erosions; DES dry eye syndrome; MGD meibomian gland dysfunction; ATs artificial tears; PFAT's preservative free artificial tears; NSC nuclear sclerotic  cataract; PSC posterior subcapsular cataract; ERM epi-retinal membrane; PVD posterior vitreous detachment; RD retinal detachment; DM diabetes mellitus; DR diabetic retinopathy; NPDR non-proliferative diabetic retinopathy; PDR proliferative diabetic retinopathy; CSME clinically significant macular edema; DME diabetic macular edema; dbh dot blot hemorrhages; CWS cotton wool spot; POAG primary open angle glaucoma; C/D cup-to-disc ratio; HVF humphrey visual field; GVF goldmann visual field; OCT optical coherence tomography; IOP intraocular pressure; BRVO Branch retinal vein occlusion; CRVO central retinal vein occlusion; CRAO central retinal artery occlusion; BRAO branch retinal artery occlusion; RT retinal tear; SB scleral buckle; PPV pars plana vitrectomy; VH Vitreous hemorrhage; PRP panretinal laser photocoagulation; IVK intravitreal kenalog; VMT vitreomacular traction; MH Macular hole;  NVD neovascularization of the disc; NVE  neovascularization elsewhere; AREDS age related eye disease study; ARMD age related macular degeneration; POAG primary open angle glaucoma; EBMD epithelial/anterior basement membrane dystrophy; ACIOL anterior chamber intraocular lens; IOL intraocular lens; PCIOL posterior chamber intraocular lens; Phaco/IOL phacoemulsification with intraocular lens placement; [place] photorefractive keratectomy; LASIK laser assisted in situ keratomileusis; HTN hypertension; DM diabetes mellitus; COPD chronic obstructive pulmonary disease

## 2023-04-29 ENCOUNTER — Ambulatory Visit (INDEPENDENT_AMBULATORY_CARE_PROVIDER_SITE_OTHER): Payer: Medicare Other | Admitting: Ophthalmology

## 2023-04-29 ENCOUNTER — Encounter (INDEPENDENT_AMBULATORY_CARE_PROVIDER_SITE_OTHER): Payer: Self-pay | Admitting: Ophthalmology

## 2023-04-29 DIAGNOSIS — H35033 Hypertensive retinopathy, bilateral: Secondary | ICD-10-CM | POA: Diagnosis not present

## 2023-04-29 DIAGNOSIS — E119 Type 2 diabetes mellitus without complications: Secondary | ICD-10-CM

## 2023-04-29 DIAGNOSIS — Z7984 Long term (current) use of oral hypoglycemic drugs: Secondary | ICD-10-CM

## 2023-04-29 DIAGNOSIS — H25813 Combined forms of age-related cataract, bilateral: Secondary | ICD-10-CM

## 2023-04-29 DIAGNOSIS — I10 Essential (primary) hypertension: Secondary | ICD-10-CM | POA: Diagnosis not present

## 2023-04-29 DIAGNOSIS — H34831 Tributary (branch) retinal vein occlusion, right eye, with macular edema: Secondary | ICD-10-CM

## 2023-04-29 DIAGNOSIS — Z794 Long term (current) use of insulin: Secondary | ICD-10-CM

## 2023-04-29 MED ORDER — AFLIBERCEPT 2MG/0.05ML IZ SOLN FOR KALEIDOSCOPE
2.0000 mg | INTRAVITREAL | Status: AC | PRN
  Administered 2023-04-29: 2 mg via INTRAVITREAL

## 2023-06-18 NOTE — Progress Notes (Addendum)
[facility] - Clinic Note  06/24/2023     CHIEF COMPLAINT Patient presents for Retina Follow Up  HISTORY OF PRESENT ILLNESS: Kurt [name] is a 72 y.o. male who presents to the clinic today for:  HPI     Retina Follow Up   Patient presents with  CRVO/BRVO.  In right eye.  This started 8 weeks ago.  Duration of 8 weeks.  Since onset it is stable.  I, the attending physician,  performed the HPI with the patient and updated documentation appropriately.        Comments   8 week retina follow up BRVO OD pt is reporting no vision changes noticed pt is reporting improved vision since having cataract surgery OD June 4 th [facility] June 24 th by Dr Sherrine Maples pt last reading 110 yesterday he came in today little shaking due to not eating today until 2       Last edited by Rennis Chris, MD on 06/24/2023  4:33 PM.    Pt had cataract sx OU with Dr. Sherrine Maples since he was here last, he states vision has improved in both eyes, he is still using drops in the left eye   Referring physician:  [facility] [address] [place],  [place] [address]  HISTORICAL INFORMATION:   Selected notes from the MEDICAL RECORD NUMBER Diabetic Eval per Dr. Karleen Hampshire   CURRENT MEDICATIONS: No current outpatient medications on file. (Ophthalmic Drugs)   No current facility-administered medications for this visit. (Ophthalmic Drugs)   Current Outpatient Medications (Other)  Medication Sig   acetaminophen (TYLENOL) 650 MG CR tablet Take 1,300 mg by mouth every 8 (eight) hours as needed for pain.   aspirin 81 MG chewable tablet Chew 1 tablet (81 mg total) by mouth daily.   B-D ULTRAFINE III SHORT PEN 31G X 8 MM MISC Inject into the skin.   docusate sodium (COLACE) 100 MG capsule Take 100 mg by mouth 2 (two) times daily.   glipiZIDE (GLUCOTROL XL) 10 MG 24 hr tablet Take 1 tablet (10 mg total) by mouth daily with breakfast.   HYDROcodone-acetaminophen (NORCO) 7.5-325 MG tablet Take 1  tablet by mouth daily as needed for moderate pain.   ketoconazole (NIZORAL) 2 % cream Apply 1 Application topically daily.   LANTUS SOLOSTAR 100 UNIT/ML Solostar Pen Inject 34 Units into the skin at bedtime.   lisinopril (ZESTRIL) 20 MG tablet Take 20 mg by mouth daily.   methocarbamol (ROBAXIN) 500 MG tablet Take 500 mg by mouth every 8 (eight) hours as needed for muscle spasms.   Multiple Vitamin (MULTIVITAMIN) tablet Take 1 tablet by mouth daily.   Omega-3 Fatty Acids (FISH OIL) 1000 MG CAPS Take by mouth.   ONETOUCH ULTRA test strip 1 each 2 (two) times daily.   simvastatin (ZOCOR) 20 MG tablet Take 20 mg by mouth every evening.   SitaGLIPtin-MetFORMIN HCl [phone] MG TB24 Take 1 tablet by mouth at bedtime.   zinc gluconate 50 MG tablet Take 50 mg by mouth daily.   No current facility-administered medications for this visit. (Other)   REVIEW OF SYSTEMS: ROS   Positive for: Musculoskeletal, Endocrine, Eyes Negative for: Constitutional, Gastrointestinal, Neurological, Skin, Genitourinary, HENT, Cardiovascular, Respiratory, Psychiatric, Allergic/Imm, Heme/Lymph Last edited by Etheleen Mayhew, COT on 06/24/2023  2:36 PM.        ALLERGIES Allergies  Allergen Reactions   Augmentin [Amoxicillin-Pot Clavulanate] Nausea And Vomiting   Iodine Hives   Tape  Rash    Paper    PAST MEDICAL HISTORY Past Medical History:  Diagnosis Date   Arthritis    Bronchitis    Bronchitis    Cataract    OU   Concussion    late 1990's  after a fall   GERD (gastroesophageal reflux disease)    History of kidney stones    Hypertension    Hypertensive retinopathy    OU   Osteogenesis imperfecta    PONV (postoperative nausea and vomiting)    pt has post polio syndrome   Post-polio syndrome    Type 2 diabetes mellitus (HCC) 12/25/2018   Past Surgical History:  Procedure Laterality Date   ANKLE FRACTURE SURGERY Bilateral    arm surgery Right    nerve surgery   COLONOSCOPY     ELBOW  FRACTURE SURGERY Left    EYE MUSCLE SURGERY Left    LEG SURGERY Left    femur fracture with rod   REVERSE SHOULDER ARTHROPLASTY Right 11/10/2019   Procedure: RIGHT REVERSE SHOULDER ARTHROPLASTY;  Surgeon: Cammy Copa, MD;  Location: [facility] OR;  Service: Orthopedics;  Laterality: Right;   TONSILLECTOMY     FAMILY HISTORY Family History  Problem Relation Age of Onset   Hypertension Mother    Diabetes Brother    SOCIAL HISTORY Social History   Tobacco Use   Smoking status: Never   Smokeless tobacco: Never  Vaping Use   Vaping Use: Never used  Substance Use Topics   Alcohol use: Yes    Comment: 1 beer occasionally   Drug use: No       OPHTHALMIC EXAM: Base Eye Exam     Visual Acuity (Snellen - Linear)       Right Left   Dist cc 20/50 20/20 -1   Dist ph cc 20/25     Correction: Glasses         Tonometry (Tonopen, 2:45 PM)       Right Left   Pressure 14 15         Pupils       Pupils Dark Light Shape React APD   Right PERRL 3 2 Round Brisk None   Left PERRL 3 2 Round Brisk None         Visual Fields       Left Right    Full Full         Extraocular Movement       Right Left    Full, Ortho Full, Ortho         Neuro/Psych     Oriented x3: Yes   Mood/Affect: Normal         Dilation     Both eyes: 2.5% Phenylephrine @ 2:45 PM           Slit Lamp and Fundus Exam     Slit Lamp Exam       Right Left   Lids/Lashes Dermatochalasis - upper lid, mild Meibomian gland dysfunction Dermatochalasis - upper lid, Dermatochalasis - lower lid, mild Meibomian gland dysfunction   Conjunctiva/Sclera blue sclera blue sclera   Cornea arcus, well healed cataract wound arcus, well healed cataract wound   Anterior Chamber deep and clear deep and clear   Iris round and dilated, no NVI round and dilated, no NVI   Lens PC IOL in good position PC IOL in good position   Anterior Vitreous mild syneresis, PVD, vitreous condensations mild syneresis  Fundus Exam       Right Left   Disc pink and sharp mild Pallor, Sharp rim   C/D Ratio 0.5 0.4   Macula Good foveal reflex; trace persistent edema SN macula -- improved, mild focal laser changes, no heme -- no great targets for focal laser flat, good foveal reflex, RPE mottling and clumping, fine drusen, No heme or edema   Vessels attenuated, mild tortuosity attenuated, Tortuous   Periphery attached, no heme attached, no heme           Refraction     Wearing Rx       Sphere Cylinder Axis Add   Right -2.50 +2.00 105 +2.75   Left -1.50 +1.75 101 +2.75           IMAGING AND PROCEDURES  Imaging and Procedures for  OCT, Retina - OU - Both Eyes       Right Eye Quality was good. Central Foveal Thickness: 238. Progression has improved. Findings include normal foveal contour, no SRF, retinal drusen , intraretinal fluid, outer retinal atrophy (Interval improvement in IRF/cystic changes SN macula).   Left Eye Quality was good. Central Foveal Thickness: 257. Progression has been stable. Findings include normal foveal contour, no IRF, no SRF, retinal drusen , vitreomacular adhesion (Focal PED/drusen temporal fovea).   Notes *Images captured and stored on drive  Diagnosis / Impression:  OD: BRVO with Interval improvement in IRF/cystic changes SN macula OS: NFP, no SRF/IRF, drusen, VMA  Clinical management:  See below  Abbreviations: NFP - Normal foveal profile. CME - cystoid macular edema. PED - pigment epithelial detachment. IRF - intraretinal fluid. SRF - subretinal fluid. EZ - ellipsoid zone. ERM - epiretinal membrane. ORA - outer retinal atrophy. ORT - outer retinal tubulation. SRHM - subretinal hyper-reflective material       Intravitreal Injection, Pharmacologic Agent - OD - Right Eye       Time Out 06/24/2023. 3:33 PM. Confirmed correct patient, procedure, site, and patient consented.   Anesthesia Topical anesthesia was used. Anesthetic medications  included Lidocaine 2%, Proparacaine 0.5%.   Procedure Preparation included 5% betadine to ocular surface, eyelid speculum. A (32 g) needle was used.   Injection: 2 mg aflibercept 2 MG/0.05ML   Route: Intravitreal, Site: Right Eye   NDC: L6038910, Lot: [id number], Expiration date: 06/15/2024, Waste: 0 mL   Post-op Post injection exam found visual acuity of at least counting fingers. The patient tolerated the procedure well. There were no complications. The patient received written and verbal post procedure care education. Post injection medications were not given.            ASSESSMENT/PLAN:   ICD-10-CM   1. 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 Current use of insulin (HCC)  Z79.4     4. Long term (current) use of oral hypoglycemic drugs  Z79.84     5. Essential hypertension  I10     6. Hypertensive retinopathy of both eyes  H35.033     7. Pseudophakia of both eyes  Z96.1       1. BRVO with CME OD  - s/p IVA OD #1 (01.19.21), #2 (02.16.21), #3 (03.16.21), #4 (4.13.21) -- IVA resistance             -  s/p IVE OD #1 (05.11.21--sample), #2 (06.08.21), #3 (07.06.21), #4 (8.3.21), #5 (08.31.21), #6 (09.28.21), #7 (10.26.21), #8 (11.23.21), #9 (12.22.21), #10 (1.24.22), #11 (2.22.22), #12 (3.22.22), #13 (04.19.22), #14 (05.25.22), #15 (06.29.22), #16 (07.27.22), #17 (08.24.22), #18 (09.21.22), #19 (10.19.22), #20 (11.22.22), #21 (12.20.22), #22 (01.18.23), #23 (02.22.23), #24 (03.28.23), #25 (05.09.23), #26 (06.20.23), #27 (08.07.23), #28 (10.02.23), #29 (11.27.23), #30 (01.22.24), #31 (03.18.24), #32 (05.13.24)  - s/p focal laser OD (07.13.22)  - BCVA OD improved to 20/25 from 20/40 (s/p CEIOL)  - exam with focal edema, IRH, CWS superonasal macula --  improved  - OCT shows BRVO with interval improvement in IRF/cystic changes SN macula at 8 weeks  - recommend IVE OD #33 today (07.08.24) w/ f/u at 8 wks  - RBA of procedure discussed, questions answered  - informed consent obtained  - Eylea informed consent form re-signed and scanned on 05.09.23  - see procedure note  - Eylea4U benefits investigation started, 05.11.21 -- approved through [employer] for 2024  - F/U 8 wks -- DFE/OCT/possible injection; pt may benefit from repeat focal laser OD, but no great targets  2-4. Diabetes mellitus, type 2 without retinopathy OU  - The incidence, risk factors for progression, natural history and treatment options for diabetic retinopathy  were discussed with patient.    - The need for close monitoring of blood glucose, blood pressure, and serum lipids, avoiding cigarette or any type of tobacco, and the need for long term follow up was also discussed with patient.  - monitor  5,6. Hypertensive retinopathy OU  - discussed importance of tight BP control  - monitor  7. Pseudophakia OU  - s/p CE/IOL (Dr. Sherrine Maples, OD: 06.05.24, OS: 06.25.24)  - IOLs in good position, doing well  - monitor   Ophthalmic Meds Ordered this visit:  Meds ordered this encounter  Medications   aflibercept (EYLEA) SOLN 2 mg     Return in about 8 weeks (around 08/19/2023) for f/u BRVO OD, DFE, OCT.  There are no Patient Instructions on file for this visit.   This document serves as a record of services personally performed by Karie Chimera, MD, PhD. It was created on their behalf by De Blanch, an ophthalmic technician. The creation of this record is the provider's dictation and/or activities during the visit.    Electronically signed by: De Blanch, OA, 06/24/23  4:49 PM  This document serves as a record of services personally performed by Karie Chimera, MD, PhD. It was created on their behalf by Glee Arvin. Manson Passey, OA an ophthalmic technician. The creation of  this record is the provider's dictation and/or activities during the visit.    Electronically signed by: Glee Arvin. Manson Passey, OA 06/24/23 4:49 PM  Karie Chimera, M.D., Ph.D. Diseases & Surgery of the Retina and Vitreous [facility]  I have reviewed the above documentation for accuracy and completeness, and I agree with the above. Karie Chimera, M.D., Ph.D. 06/24/23 4:49 PM  Abbreviations: M myopia (nearsighted); A astigmatism; H hyperopia (farsighted); P presbyopia; Mrx spectacle prescription;  CTL contact lenses; OD right eye; OS left eye; OU both eyes  XT exotropia; ET esotropia; PEK punctate epithelial keratitis; PEE punctate epithelial erosions; DES dry eye syndrome; MGD meibomian gland dysfunction; ATs artificial tears; PFAT's preservative free artificial tears; NSC nuclear sclerotic cataract; PSC posterior subcapsular cataract; ERM epi-retinal membrane; PVD posterior vitreous detachment; RD retinal detachment; DM diabetes mellitus; DR diabetic retinopathy; NPDR non-proliferative diabetic retinopathy; PDR proliferative diabetic retinopathy;  CSME clinically significant macular edema; DME diabetic macular edema; dbh dot blot hemorrhages; CWS cotton wool spot; POAG primary open angle glaucoma; C/D cup-to-disc ratio; HVF humphrey visual field; GVF goldmann visual field; OCT optical coherence tomography; IOP intraocular pressure; BRVO Branch retinal vein occlusion; CRVO central retinal vein occlusion; CRAO central retinal artery occlusion; BRAO branch retinal artery occlusion; RT retinal tear; SB scleral buckle; PPV pars plana vitrectomy; VH Vitreous hemorrhage; PRP panretinal laser photocoagulation; IVK intravitreal kenalog; VMT vitreomacular traction; MH Macular hole;  NVD neovascularization of the disc; NVE neovascularization elsewhere; AREDS age related eye disease study; ARMD age related macular degeneration; POAG primary open angle glaucoma; EBMD epithelial/anterior basement  membrane dystrophy; ACIOL anterior chamber intraocular lens; IOL intraocular lens; PCIOL posterior chamber intraocular lens; Phaco/IOL phacoemulsification with intraocular lens placement; PRK photorefractive keratectomy; LASIK laser assisted in situ keratomileusis; HTN hypertension; DM diabetes mellitus; COPD chronic obstructive pulmonary disease

## 2023-06-24 ENCOUNTER — Encounter (INDEPENDENT_AMBULATORY_CARE_PROVIDER_SITE_OTHER): Payer: Self-pay | Admitting: Ophthalmology

## 2023-06-24 ENCOUNTER — Ambulatory Visit (INDEPENDENT_AMBULATORY_CARE_PROVIDER_SITE_OTHER): Payer: Medicare Other | Admitting: Ophthalmology

## 2023-06-24 DIAGNOSIS — Z961 Presence of intraocular lens: Secondary | ICD-10-CM

## 2023-06-24 DIAGNOSIS — Z794 Long term (current) use of insulin: Secondary | ICD-10-CM | POA: Diagnosis not present

## 2023-06-24 DIAGNOSIS — E119 Type 2 diabetes mellitus without complications: Secondary | ICD-10-CM

## 2023-06-24 DIAGNOSIS — Z7984 Long term (current) use of oral hypoglycemic drugs: Secondary | ICD-10-CM

## 2023-06-24 DIAGNOSIS — H34831 Tributary (branch) retinal vein occlusion, right eye, with macular edema: Secondary | ICD-10-CM | POA: Diagnosis not present

## 2023-06-24 DIAGNOSIS — H25813 Combined forms of age-related cataract, bilateral: Secondary | ICD-10-CM

## 2023-06-24 DIAGNOSIS — I10 Essential (primary) hypertension: Secondary | ICD-10-CM

## 2023-06-24 DIAGNOSIS — H35033 Hypertensive retinopathy, bilateral: Secondary | ICD-10-CM

## 2023-06-24 MED ORDER — AFLIBERCEPT 2MG/0.05ML IZ SOLN FOR KALEIDOSCOPE
2.0000 mg | INTRAVITREAL | Status: AC | PRN
  Administered 2023-06-24: 2 mg via INTRAVITREAL

## 2023-08-06 NOTE — Progress Notes (Signed)
[facility] - Clinic Note  08/20/2023     CHIEF COMPLAINT Patient presents for Retina Follow Up  HISTORY OF PRESENT ILLNESS: Kurt [name] is a 72 y.o. male who presents to the clinic today for:  HPI     Retina Follow Up   Patient presents with  CRVO/BRVO.  In right eye.  This started 8 weeks ago.  I, the attending physician,  performed the HPI with the patient and updated documentation appropriately.        Comments   Patient here for 8 weeks retina follow up for BRVO OD. Patient states vision doing good. Blurry in am.       Last edited by Rennis Chris, MD on 08/20/2023  5:39 PM.    Patient states the eyes are burning. He is pleased with the visual improvement since cataract surgery.   Referring physician:  [facility] [address] [place],  [place] [address]  HISTORICAL INFORMATION:   Selected notes from the MEDICAL RECORD NUMBER Diabetic Eval per Dr. Karleen Hampshire   CURRENT MEDICATIONS: No current outpatient medications on file. (Ophthalmic Drugs)   No current facility-administered medications for this visit. (Ophthalmic Drugs)   Current Outpatient Medications (Other)  Medication Sig   acetaminophen (TYLENOL) 650 MG CR tablet Take 1,300 mg by mouth every 8 (eight) hours as needed for pain.   aspirin 81 MG chewable tablet Chew 1 tablet (81 mg total) by mouth daily.   B-D ULTRAFINE III SHORT PEN 31G X 8 MM MISC Inject into the skin.   docusate sodium (COLACE) 100 MG capsule Take 100 mg by mouth 2 (two) times daily.   glipiZIDE (GLUCOTROL XL) 10 MG 24 hr tablet Take 1 tablet (10 mg total) by mouth daily with breakfast.   HYDROcodone-acetaminophen (NORCO) 7.5-325 MG tablet Take 1 tablet by mouth daily as needed for moderate pain.   ketoconazole (NIZORAL) 2 % cream Apply 1 Application topically daily.   LANTUS SOLOSTAR 100 UNIT/ML Solostar Pen Inject 34 Units into the skin at bedtime.   lisinopril (ZESTRIL) 20 MG tablet Take 20 mg  by mouth daily.   methocarbamol (ROBAXIN) 500 MG tablet Take 500 mg by mouth every 8 (eight) hours as needed for muscle spasms.   Multiple Vitamin (MULTIVITAMIN) tablet Take 1 tablet by mouth daily.   Omega-3 Fatty Acids (FISH OIL) 1000 MG CAPS Take by mouth.   ONETOUCH ULTRA test strip 1 each 2 (two) times daily.   simvastatin (ZOCOR) 20 MG tablet Take 20 mg by mouth every evening.   SitaGLIPtin-MetFORMIN HCl [phone] MG TB24 Take 1 tablet by mouth at bedtime.   zinc gluconate 50 MG tablet Take 50 mg by mouth daily.   No current facility-administered medications for this visit. (Other)   REVIEW OF SYSTEMS: ROS   Positive for: Musculoskeletal, Endocrine, Eyes Negative for: Constitutional, Gastrointestinal, Neurological, Skin, Genitourinary, HENT, Cardiovascular, Respiratory, Psychiatric, Allergic/Imm, Heme/Lymph Last edited by Laddie Aquas, COA on 08/20/2023  2:52 PM.     ALLERGIES Allergies  Allergen Reactions   Augmentin [Amoxicillin-Pot Clavulanate] Nausea And Vomiting   Iodine Hives   Tape Rash    Paper    PAST MEDICAL HISTORY Past Medical History:  Diagnosis Date   Arthritis    Bronchitis    Bronchitis    Cataract    OU   Concussion    late 1990's  after a fall   GERD (gastroesophageal reflux disease)    History of kidney stones  Hypertension    Hypertensive retinopathy    OU   Osteogenesis imperfecta    PONV (postoperative nausea and vomiting)    pt has post polio syndrome   Post-polio syndrome    Type 2 diabetes mellitus (HCC) 12/25/2018   Past Surgical History:  Procedure Laterality Date   ANKLE FRACTURE SURGERY Bilateral    arm surgery Right    nerve surgery   COLONOSCOPY     ELBOW FRACTURE SURGERY Left    EYE MUSCLE SURGERY Left    LEG SURGERY Left    femur fracture with rod   REVERSE SHOULDER ARTHROPLASTY Right 11/10/2019   Procedure: RIGHT REVERSE SHOULDER ARTHROPLASTY;  Surgeon: Cammy Copa, MD;  Location: [facility] OR;  Service: Orthopedics;   Laterality: Right;   TONSILLECTOMY     FAMILY HISTORY Family History  Problem Relation Age of Onset   Hypertension Mother    Diabetes Brother    SOCIAL HISTORY Social History   Tobacco Use   Smoking status: Never   Smokeless tobacco: Never  Vaping Use   Vaping status: Never Used  Substance Use Topics   Alcohol use: Yes    Comment: 1 beer occasionally   Drug use: No       OPHTHALMIC EXAM: Base Eye Exam     Visual Acuity (Snellen - Linear)       Right Left   Dist cc 20/50 20/20   Dist ph cc 20/25 -1     Correction: Glasses         Tonometry (Tonopen, 2:50 PM)       Right Left   Pressure 10 13         Pupils       Dark Light Shape React APD   Right 3 2 Round Brisk None   Left 3 2 Round Brisk None         Visual Fields (Counting fingers)       Left Right    Full Full         Extraocular Movement       Right Left    Full, Ortho Full, Ortho         Neuro/Psych     Oriented x3: Yes   Mood/Affect: Normal         Dilation     Both eyes: 1.0% Mydriacyl, 2.5% Phenylephrine @ 2:50 PM           Slit Lamp and Fundus Exam     Slit Lamp Exam       Right Left   Lids/Lashes Dermatochalasis - upper lid, mild Meibomian gland dysfunction Dermatochalasis - upper lid, Dermatochalasis - lower lid, mild Meibomian gland dysfunction   Conjunctiva/Sclera blue sclera blue sclera   Cornea arcus, well healed cataract wound, trace Punctate epithelial erosions, Debris in tear film arcus, well healed cataract wound, trace Debris in tear film   Anterior Chamber deep and clear deep and clear   Iris round and dilated, no NVI round and dilated, no NVI   Lens PC IOL in good position PC IOL in good position   Anterior Vitreous mild syneresis, PVD, vitreous condensations mild syneresis         Fundus Exam       Right Left   Disc pink and sharp mild Pallor, Sharp rim   C/D Ratio 0.5 0.4   Macula Good foveal reflex; trace persistent edema SN macula,  mild focal laser changes, no heme -- no great targets for focal laser  flat, good foveal reflex, RPE mottling and clumping, fine drusen, No heme or edema   Vessels attenuated, mild tortuosity attenuated, Tortuous   Periphery attached, no heme attached, no heme           Refraction     Wearing Rx       Sphere Cylinder Axis Add   Right -2.50 +2.00 105 +2.75   Left -1.50 +1.75 101 +2.75           IMAGING AND PROCEDURES  Imaging and Procedures for  OCT, Retina - OU - Both Eyes       Right Eye Quality was good. Central Foveal Thickness: 239. Progression has been stable. Findings include normal foveal contour, no SRF, retinal drusen , intraretinal fluid, outer retinal atrophy (Mild persistent IRF/cystic changes SN macula).   Left Eye Quality was good. Central Foveal Thickness: 256. Progression has been stable. Findings include normal foveal contour, no IRF, no SRF, retinal drusen , vitreomacular adhesion (Focal PED/drusen temporal fovea).   Notes *Images captured and stored on drive  Diagnosis / Impression:  OD: BRVO with Mild persistent IRF/cystic changes SN macula OS: NFP, no SRF/IRF, drusen, VMA  Clinical management:  See below  Abbreviations: NFP - Normal foveal profile. CME - cystoid macular edema. PED - pigment epithelial detachment. IRF - intraretinal fluid. SRF - subretinal fluid. EZ - ellipsoid zone. ERM - epiretinal membrane. ORA - outer retinal atrophy. ORT - outer retinal tubulation. SRHM - subretinal hyper-reflective material       Intravitreal Injection, Pharmacologic Agent - OD - Right Eye       Time Out 08/20/2023. 3:32 PM. Confirmed correct patient, procedure, site, and patient consented.   Anesthesia Topical anesthesia was used. Anesthetic medications included Lidocaine 2%, Proparacaine 0.5%.   Procedure Preparation included 5% betadine to ocular surface, eyelid speculum. A (32 g) needle was used.   Injection: 2 mg aflibercept 2 MG/0.05ML    Route: Intravitreal, Site: Right Eye   NDC: L6038910, Lot: [id number], Expiration date: 11/15/2024, Waste: 0 mL   Post-op Post injection exam found visual acuity of at least counting fingers. The patient tolerated the procedure well. There were no complications. The patient received written and verbal post procedure care education. Post injection medications were not given.            ASSESSMENT/PLAN:   ICD-10-CM   1. 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 Current use of insulin (HCC)  Z79.4     4. Long term (current) use of oral hypoglycemic drugs  Z79.84     5. Essential hypertension  I10     6. Hypertensive retinopathy of both eyes  H35.033     7. Pseudophakia of both eyes  Z96.1      1. BRVO with CME OD - s/p IVA OD #1 (01.19.21), #2 (02.16.21), #3 (03.16.21), #4 (4.13.21) -- IVA resistance  - s/p IVE OD #1 (05.11.21--sample), #2 (06.08.21), #3 (07.06.21), #4 (8.3.21), #5 (08.31.21), #6 (09.28.21), #7 (10.26.21), #8 (11.23.21), #9 (12.22.21), #10 (1.24.22), #11 (2.22.22), #12 (3.22.22), #13 (04.19.22), #14 (05.25.22), #15 (06.29.22), #16 (07.27.22), #17 (08.24.22), #18 (09.21.22), #19 (10.19.22), #20 (11.22.22), #21 (12.20.22), #22 (01.18.23), #23 (02.22.23), #24 (03.28.23), #25 (05.09.23), #26 (06.20.23), #27 (  08.07.23), #28 (10.02.23), #29 (11.27.23), #30 (01.22.24), #31 (03.18.24), #32 (05.13.24), #33 (07.08.24)  - s/p focal laser OD (07.13.22)  - BCVA OD 20/25 (s/p CEIOL) - stable  - exam with focal edema, IRH, CWS superonasal macula -- improved - OCT shows BRVO with Mild persistent IRF/cystic changes SN macula 8 weeks  - recommend IVE OD #34 today (09.03.24) -- maintenance w/ f/u at 8 wks  - RBA of procedure discussed, questions  answered  - Eylea informed consent form re-signed and scanned on 09.03.24  - see procedure note - Eylea4U benefits investigation started, 05.11.21 -- approved through [employer] for 2024 - F/U 8 wks -- DFE/OCT/possible injection; pt may benefit from repeat focal laser OD, but no great targets  2-4. Diabetes mellitus, type 2 without retinopathy OU - The incidence, risk factors for progression, natural history and treatment options for diabetic retinopathy  were discussed with patient.   - The need for close monitoring of blood glucose, blood pressure, and serum lipids, avoiding cigarette or any type of tobacco, and the need for long term follow up was also discussed with patient.  - monitor  5,6. Hypertensive retinopathy OU  - discussed importance of tight BP control  - monitor  7. Pseudophakia OU  - s/p CE/IOL (Dr. Sherrine Maples, OD: 06.05.24, OS: 06.25.24)  - IOLs in good position, doing well  - monitor   Ophthalmic Meds Ordered this visit:  Meds ordered this encounter  Medications   aflibercept (EYLEA) SOLN 2 mg     Return in about 8 weeks (around 10/15/2023) for f/u BRVO OD , DFE, OCT, Possible, IVE, OD.  There are no Patient Instructions on file for this visit.  This document serves as a record of services personally performed by Karie Chimera, MD, PhD. It was created on their behalf by Laurey Morale, COT an ophthalmic technician. The creation of this record is the provider's dictation and/or activities during the visit.    Electronically signed by:  Charlette Caffey, COT  08/20/23 5:44 PM  Karie Chimera, M.D., Ph.D. Diseases & Surgery of the Retina and Vitreous [facility]  I have reviewed the above documentation for accuracy and completeness, and I agree with the above. Karie Chimera, M.D., Ph.D. 08/20/23 5:45 PM   Abbreviations: M myopia (nearsighted); A astigmatism; H hyperopia (farsighted); P presbyopia; Mrx spectacle prescription;  CTL  contact lenses; OD right eye; OS left eye; OU both eyes  XT exotropia; ET esotropia; PEK punctate epithelial keratitis; PEE punctate epithelial erosions; DES dry eye syndrome; MGD meibomian gland dysfunction; ATs artificial tears; PFAT's preservative free artificial tears; NSC nuclear sclerotic cataract; PSC posterior subcapsular cataract; ERM epi-retinal membrane; PVD posterior vitreous detachment; RD retinal detachment; DM diabetes mellitus; DR diabetic retinopathy; NPDR non-proliferative diabetic retinopathy; PDR proliferative diabetic retinopathy; CSME clinically significant macular edema; DME diabetic macular edema; dbh dot blot hemorrhages; CWS cotton wool spot; POAG primary open angle glaucoma; C/D cup-to-disc ratio; HVF humphrey visual field; GVF goldmann visual field; OCT optical coherence tomography; IOP intraocular pressure; BRVO Branch retinal vein occlusion; CRVO central retinal vein occlusion; CRAO central retinal artery occlusion; BRAO branch retinal artery occlusion; RT retinal tear; SB scleral buckle; PPV pars plana vitrectomy; VH Vitreous hemorrhage; PRP panretinal laser photocoagulation; IVK intravitreal kenalog; VMT vitreomacular traction; MH Macular hole;  NVD neovascularization of the disc; NVE neovascularization elsewhere; AREDS age related eye disease study; ARMD age related macular degeneration; POAG primary open angle glaucoma; EBMD epithelial/anterior basement membrane dystrophy; ACIOL  anterior chamber intraocular lens; IOL intraocular lens; PCIOL posterior chamber intraocular lens; Phaco/IOL phacoemulsification with intraocular lens placement; PRK photorefractive keratectomy; LASIK laser assisted in situ keratomileusis; HTN hypertension; DM diabetes mellitus; COPD chronic obstructive pulmonary disease

## 2023-08-20 ENCOUNTER — Encounter (INDEPENDENT_AMBULATORY_CARE_PROVIDER_SITE_OTHER): Payer: Self-pay | Admitting: Ophthalmology

## 2023-08-20 ENCOUNTER — Ambulatory Visit (INDEPENDENT_AMBULATORY_CARE_PROVIDER_SITE_OTHER): Payer: Medicare Other | Admitting: Ophthalmology

## 2023-08-20 DIAGNOSIS — Z7984 Long term (current) use of oral hypoglycemic drugs: Secondary | ICD-10-CM

## 2023-08-20 DIAGNOSIS — E119 Type 2 diabetes mellitus without complications: Secondary | ICD-10-CM | POA: Diagnosis not present

## 2023-08-20 DIAGNOSIS — H35033 Hypertensive retinopathy, bilateral: Secondary | ICD-10-CM

## 2023-08-20 DIAGNOSIS — I10 Essential (primary) hypertension: Secondary | ICD-10-CM

## 2023-08-20 DIAGNOSIS — Z961 Presence of intraocular lens: Secondary | ICD-10-CM

## 2023-08-20 DIAGNOSIS — Z794 Long term (current) use of insulin: Secondary | ICD-10-CM | POA: Diagnosis not present

## 2023-08-20 DIAGNOSIS — H34831 Tributary (branch) retinal vein occlusion, right eye, with macular edema: Secondary | ICD-10-CM | POA: Diagnosis not present

## 2023-08-20 MED ORDER — AFLIBERCEPT 2MG/0.05ML IZ SOLN FOR KALEIDOSCOPE
2.0000 mg | INTRAVITREAL | Status: AC | PRN
Start: 2023-08-20 — End: 2023-08-20
  Administered 2023-08-20: 2 mg via INTRAVITREAL

## 2023-08-30 ENCOUNTER — Other Ambulatory Visit: Payer: Self-pay | Admitting: *Deleted

## 2023-08-30 DIAGNOSIS — L03116 Cellulitis of left lower limb: Secondary | ICD-10-CM

## 2023-09-06 ENCOUNTER — Ambulatory Visit (HOSPITAL_COMMUNITY)
Admission: RE | Admit: 2023-09-06 | Discharge: 2023-09-06 | Disposition: A | Discharge status: Home / Self Care | Payer: Medicare Other | Source: Ambulatory Visit | Attending: Vascular Surgery | Admitting: Vascular Surgery

## 2023-09-06 ENCOUNTER — Encounter: Payer: Self-pay | Admitting: Physician Assistant

## 2023-09-06 ENCOUNTER — Ambulatory Visit (INDEPENDENT_AMBULATORY_CARE_PROVIDER_SITE_OTHER): Payer: Medicare Other | Admitting: Physician Assistant

## 2023-09-06 VITALS — BP 129/83 | HR 63 | Temp 98.0°F | Resp 18

## 2023-09-06 DIAGNOSIS — L03116 Cellulitis of left lower limb: Secondary | ICD-10-CM | POA: Diagnosis present

## 2023-09-06 DIAGNOSIS — M7989 Other specified soft tissue disorders: Secondary | ICD-10-CM

## 2023-09-06 NOTE — Progress Notes (Signed)
[facility]           [facility] [place]  History and Physical   Kurt [name] is a 72 y.o. male who presents with BLE swelling with left worse than right.    Pt states that he started having significant leg swelling since he had bilateral ankle fractures in 2006.  He has hx of polio.  He has diabetes.  He does have hx of DVT in the right upper arm in 2022 thought to be from an IV stick per pt.  He states that he has had several bouts with cellulitis of the LLE.  He has used compression wraps in the past.  He really has not had any ulcerations in a couple of years.  He does have skin color changes bilateral lower legs.  He does use compression and states this makes his legs feel better.  He states he has an aunt on his dad's side that had hx of varicose veins.  He has trouble with his balance and uses a Rolator.   He does get occasional pain in his feet at night but not consistently.  He states that the swelling in his legs are some better in the morning after being in bed overnight.    The pt is on a statin for cholesterol management.  The pt is on a daily aspirin.   Other AC:  none The pt is on ACEI for hypertension.   The pt is on medication for diabetes.   Tobacco hx:  never  Pt does not have family hx of AAA.  Past Medical History:  Diagnosis Date   Arthritis    Bronchitis    Bronchitis    Cataract    OU   Concussion    late 1990's  after a fall   GERD (gastroesophageal reflux disease)    History of kidney stones    Hypertension    Hypertensive retinopathy    OU   Osteogenesis imperfecta    PONV (postoperative nausea and vomiting)    pt has post polio syndrome   Post-polio syndrome    Type 2 diabetes mellitus (HCC) 12/25/2018    Past Surgical History:  Procedure Laterality Date   ANKLE FRACTURE SURGERY Bilateral    arm surgery Right    nerve surgery   COLONOSCOPY     ELBOW FRACTURE SURGERY Left    EYE MUSCLE SURGERY Left    LEG SURGERY  Left    femur fracture with rod   REVERSE SHOULDER ARTHROPLASTY Right 11/10/2019   Procedure: RIGHT REVERSE SHOULDER ARTHROPLASTY;  Surgeon: Cammy Copa, MD;  Location: [facility] OR;  Service: Orthopedics;  Laterality: Right;   TONSILLECTOMY      Social History   Socioeconomic History   Marital status: Widowed    Spouse name: Not on file   Number of children: Not on file   Years of education: Not on file   Highest education level: Not on file  Occupational History   Not on file  Tobacco Use   Smoking status: Never   Smokeless tobacco: Never  Vaping Use   Vaping status: Never Used  Substance and Sexual Activity   Alcohol use: Yes    Comment: 1 beer occasionally   Drug use: No   Sexual activity: Not Currently  Other Topics Concern   Not on file  Social History Narrative   Not on file   Social Determinants of Health  Financial Resource Strain: Not on file  Food Insecurity: Not on file  Transportation Needs: Not on file  Physical Activity: Not on file  Stress: Not on file  Social Connections: Not on file  Intimate Partner Violence: Not on file     Family History  Problem Relation Age of Onset   Hypertension Mother    Diabetes Brother     Current Outpatient Medications  Medication Sig Dispense Refill   acetaminophen (TYLENOL) 650 MG CR tablet Take 1,300 mg by mouth every 8 (eight) hours as needed for pain.     aspirin 81 MG chewable tablet Chew 1 tablet (81 mg total) by mouth daily. 30 tablet 0   B-D ULTRAFINE III SHORT PEN 31G X 8 MM MISC Inject into the skin.     docusate sodium (COLACE) 100 MG capsule Take 100 mg by mouth 2 (two) times daily.     glipiZIDE (GLUCOTROL XL) 10 MG 24 hr tablet Take 1 tablet (10 mg total) by mouth daily with breakfast.     HYDROcodone-acetaminophen (NORCO) 7.5-325 MG tablet Take 1 tablet by mouth daily as needed for moderate pain.     ketoconazole (NIZORAL) 2 % cream Apply 1 Application topically daily.     LANTUS SOLOSTAR 100  UNIT/ML Solostar Pen Inject 34 Units into the skin at bedtime.     lisinopril (ZESTRIL) 20 MG tablet Take 20 mg by mouth daily.     methocarbamol (ROBAXIN) 500 MG tablet Take 500 mg by mouth every 8 (eight) hours as needed for muscle spasms.     Multiple Vitamin (MULTIVITAMIN) tablet Take 1 tablet by mouth daily.     Omega-3 Fatty Acids (FISH OIL) 1000 MG CAPS Take by mouth.     ONETOUCH ULTRA test strip 1 each 2 (two) times daily.     simvastatin (ZOCOR) 20 MG tablet Take 20 mg by mouth every evening.     SitaGLIPtin-MetFORMIN HCl [phone] MG TB24 Take 1 tablet by mouth at bedtime.     zinc gluconate 50 MG tablet Take 50 mg by mouth daily.     No current facility-administered medications for this visit.    Allergies  Allergen Reactions   Augmentin [Amoxicillin-Pot Clavulanate] Nausea And Vomiting   Iodine Hives   Tape Rash    Paper     REVIEW OF SYSTEMS:   [X]  denotes positive finding, [ ]  denotes negative finding Cardiac  Comments:  Chest pain or chest pressure:    Shortness of breath upon exertion:    Short of breath when lying flat:    Irregular heart rhythm:        Vascular    Pain in calf, thigh, or hip brought on by ambulation:    Pain in feet at night that wakes you up from your sleep:     Blood clot in your veins:    Leg swelling:  x       Pulmonary    Oxygen at home:    Productive cough:     Wheezing:         Neurologic    Sudden weakness in arms or legs:     Sudden numbness in arms or legs:     Sudden onset of difficulty speaking or slurred speech:    Temporary loss of vision in one eye:     Problems with dizziness:         Gastrointestinal    Blood in stool:     Vomited blood:  Genitourinary    Burning when urinating:     Blood in urine:        Psychiatric    Major depression:         Hematologic    Bleeding problems:    Problems with blood clotting too easily:        Skin    Rashes or ulcers:        Constitutional    Fever or  chills:      PHYSICAL EXAMINATION:  Today's Vitals   09/06/23 1345  BP: 129/83  Pulse: 63  Resp: 18  Temp: 98 F (36.7 C)  TempSrc: Temporal  SpO2: 96%   There is no height or weight on file to calculate BMI.   General:  WDWN in NAD; vital signs documented above Gait: Not observed HENT: WNL, normocephalic Pulmonary: normal non-labored breathing without wheezing Cardiac: regular HR; without carotid bruits Abdomen: soft, NT, aortic pulse is not palpable Skin: without rashes Vascular Exam/Pulses:  Right Left  Radial 2+ (normal) 2+ (normal)  DP Brisk multiphasic doppler Brisk multiphasic doppler  PT Brisk multiphasic doppler Brisk multiphasic doppler   Extremities: BLE swelling with left > right.   Neurologic: A&O X 3;  moving all extremities equally Psychiatric:  The pt has Normal affect.   Non-Invasive Vascular Imaging:   Venous duplex on 09/06/2023: +--------------+---------+------+-----------+------------+--------+  LEFT         Reflux NoRefluxReflux TimeDiameter cmsComments                          Yes                                   +--------------+---------+------+-----------+------------+--------+  CFV                    yes                                   +--------------+---------+------+-----------+------------+--------+  FV mid        no                                              +--------------+---------+------+-----------+------------+--------+  Popliteal    no                                              +--------------+---------+------+-----------+------------+--------+  GSV at SFJ              yes    >500 ms      0.83              +--------------+---------+------+-----------+------------+--------+  GSV prox thigh          yes    >500 ms      0.56              +--------------+---------+------+-----------+------------+--------+  GSV mid thigh no                            0.44               +--------------+---------+------+-----------+------------+--------+  GSV dist thighno                            0.37              +--------------+---------+------+-----------+------------+--------+  GSV at knee   no                            0.33              +--------------+---------+------+-----------+------------+--------+  GSV prox calf no                            0.28              +--------------+---------+------+-----------+------------+--------+  SSV Pop Fossa no                            0.50              +--------------+---------+------+-----------+------------+--------+  SSV prox calf no                            0.40              +--------------+---------+------+-----------+------------+--------+   Summary:  Left:  - No evidence of deep vein thrombosis seen in the left lower extremity, from the common femoral through the popliteal veins.  - No evidence of superficial venous thrombosis in the left lower extremity.  - The deep venous system is incompetent at teh common femoral vein.  - The great saphenous vein is incompetent at the saphenofemoral junction  and proximal thigh.  - The small saphenous vein is competent.     Leary Babayev Graybill is a 72 y.o. male who presents with: BLE swelling with left worse than right    -unable to palpate pedal pulses due to swelling but he does have multiphasic doppler flow bilateral feet -pt does not have evidence of DVT.  Pt does have venous reflux in the left deep system as well as the GSV at the [facility] and in the GSV in the proximal thigh. -discussed with pt about continuing to wear his knee high compression stockings  -discussed the importance of leg elevation and how to elevate properly - pt is advised to elevate their legs and a diagram is given to them to demonstrate for pt to lay flat on their back with knees elevated and slightly bent with their feet higher than their knees, which puts their feet  higher than their heart for 15 minutes per day.  If pt cannot lay flat, advised to lay as flat as possible.  -pt is advised to continue as much walking as possible and avoid sitting or standing for long periods of time.  -discussed importance of weight loss and exercise and that water aerobics would also be beneficial.  -handout with recommendations given -given the LLE has significant swelling, will get a CT venogram to evaluate for May Thurner.  This can be done at [facility] and pt will f/u in 4-6 weeks with vein MD for further discussions.     Doreatha Massed, [facility] [phone]  Clinic MD:  Karin Lieu

## 2023-09-27 ENCOUNTER — Other Ambulatory Visit: Payer: Self-pay

## 2023-09-27 DIAGNOSIS — M7989 Other specified soft tissue disorders: Secondary | ICD-10-CM

## 2023-10-11 NOTE — Progress Notes (Signed)
[facility] - Clinic Note  10/14/2023     CHIEF COMPLAINT Patient presents for Retina Follow Up  HISTORY OF PRESENT ILLNESS: Kurt [name] is a 72 y.o. male who presents to the clinic today for:  HPI     Retina Follow Up   Patient presents with  CRVO/BRVO.  In right eye.  This started 8 weeks ago.  Duration of 8 weeks.  Since onset it is stable.  I, the attending physician,  performed the HPI with the patient and updated documentation appropriately.        Comments   8 week retina follow up BRVO OD and IVE OD pt is reporting no vision changes noticed he denies any flashes or floaters pt last reading 130 range       Last edited by Rennis Chris, MD on 10/14/2023  4:36 PM.    Patient states   Referring physician:  [facility] [address] [place],  [place] [address]  HISTORICAL INFORMATION:   Selected notes from the MEDICAL RECORD NUMBER Diabetic Eval per Dr. Karleen Hampshire   CURRENT MEDICATIONS: No current outpatient medications on file. (Ophthalmic Drugs)   No current facility-administered medications for this visit. (Ophthalmic Drugs)   Current Outpatient Medications (Other)  Medication Sig   acetaminophen (TYLENOL) 650 MG CR tablet Take 1,300 mg by mouth every 8 (eight) hours as needed for pain.   aspirin 81 MG chewable tablet Chew 1 tablet (81 mg total) by mouth daily.   B-D ULTRAFINE III SHORT PEN 31G X 8 MM MISC Inject into the skin.   docusate sodium (COLACE) 100 MG capsule Take 100 mg by mouth 2 (two) times daily.   glipiZIDE (GLUCOTROL XL) 10 MG 24 hr tablet Take 1 tablet (10 mg total) by mouth daily with breakfast.   HYDROcodone-acetaminophen (NORCO) 7.5-325 MG tablet Take 1 tablet by mouth daily as needed for moderate pain.   ketoconazole (NIZORAL) 2 % cream Apply 1 Application topically daily.   LANTUS SOLOSTAR 100 UNIT/ML Solostar Pen Inject 34 Units into the skin at bedtime.   lisinopril (ZESTRIL) 20 MG tablet Take  20 mg by mouth daily.   methocarbamol (ROBAXIN) 500 MG tablet Take 500 mg by mouth every 8 (eight) hours as needed for muscle spasms.   Multiple Vitamin (MULTIVITAMIN) tablet Take 1 tablet by mouth daily.   Omega-3 Fatty Acids (FISH OIL) 1000 MG CAPS Take by mouth.   ONETOUCH ULTRA test strip 1 each 2 (two) times daily.   simvastatin (ZOCOR) 20 MG tablet Take 20 mg by mouth every evening.   SitaGLIPtin-MetFORMIN HCl [phone] MG TB24 Take 1 tablet by mouth at bedtime.   zinc gluconate 50 MG tablet Take 50 mg by mouth daily.   No current facility-administered medications for this visit. (Other)   REVIEW OF SYSTEMS: ROS   Positive for: Musculoskeletal, Endocrine, Eyes Negative for: Constitutional, Gastrointestinal, Neurological, Skin, Genitourinary, HENT, Cardiovascular, Respiratory, Psychiatric, Allergic/Imm, Heme/Lymph Last edited by Etheleen Mayhew, COT on 10/14/2023  2:46 PM.      ALLERGIES Allergies  Allergen Reactions   Augmentin [Amoxicillin-Pot Clavulanate] Nausea And Vomiting   Iodine Hives   Tape Rash    Paper    PAST MEDICAL HISTORY Past Medical History:  Diagnosis Date   Arthritis    Bronchitis    Bronchitis    Cataract    OU   Concussion    late 1990's  after a fall   GERD (gastroesophageal reflux disease)  History of kidney stones    Hypertension    Hypertensive retinopathy    OU   Osteogenesis imperfecta    PONV (postoperative nausea and vomiting)    pt has post polio syndrome   Post-polio syndrome    Type 2 diabetes mellitus (HCC) 12/25/2018   Past Surgical History:  Procedure Laterality Date   ANKLE FRACTURE SURGERY Bilateral    arm surgery Right    nerve surgery   COLONOSCOPY     ELBOW FRACTURE SURGERY Left    EYE MUSCLE SURGERY Left    LEG SURGERY Left    femur fracture with rod   REVERSE SHOULDER ARTHROPLASTY Right 11/10/2019   Procedure: RIGHT REVERSE SHOULDER ARTHROPLASTY;  Surgeon: Cammy Copa, MD;  Location: [facility] OR;   Service: Orthopedics;  Laterality: Right;   TONSILLECTOMY     FAMILY HISTORY Family History  Problem Relation Age of Onset   Hypertension Mother    Diabetes Brother    SOCIAL HISTORY Social History   Tobacco Use   Smoking status: Never   Smokeless tobacco: Never  Vaping Use   Vaping status: Never Used  Substance Use Topics   Alcohol use: Yes    Comment: 1 beer occasionally   Drug use: No       OPHTHALMIC EXAM: Base Eye Exam     Visual Acuity (Snellen - Linear)       Right Left   Dist cc 20/40 -1 20/20 -2   Dist ph cc 20/25 -2 NI    Correction: Glasses         Tonometry (Tonopen, 2:54 PM)       Right Left   Pressure 11 12         Pupils       Pupils Dark Light Shape React APD   Right PERRL 3 2 Round Brisk None   Left PERRL 3 2 Round Brisk None         Visual Fields       Left Right    Full Full         Extraocular Movement       Right Left    Full, Ortho Full, Ortho         Neuro/Psych     Oriented x3: Yes   Mood/Affect: Normal         Dilation     Both eyes: 2.5% Phenylephrine @ 2:54 PM           Slit Lamp and Fundus Exam     Slit Lamp Exam       Right Left   Lids/Lashes Dermatochalasis - upper lid, mild Meibomian gland dysfunction Dermatochalasis - upper lid, Dermatochalasis - lower lid, mild Meibomian gland dysfunction   Conjunctiva/Sclera blue sclera blue sclera   Cornea arcus, well healed cataract wound, trace Punctate epithelial erosions, Debris in tear film arcus, well healed cataract wound, trace Debris in tear film   Anterior Chamber deep and clear deep and clear   Iris round and dilated, no NVI round and dilated, no NVI   Lens PC IOL in good position PC IOL in good position   Anterior Vitreous mild syneresis, PVD, vitreous condensations mild syneresis         Fundus Exam       Right Left   Disc pink and sharp mild Pallor, Sharp rim   C/D Ratio 0.5 0.4   Macula Good foveal reflex; trace persistent  edema SN macula -- slightly improved, mild focal  laser changes, no heme -- no great targets for focal laser flat, good foveal reflex, RPE mottling and clumping, fine drusen, No heme or edema   Vessels attenuated, mild tortuosity attenuated, Tortuous   Periphery attached, no heme attached, no heme           Refraction     Wearing Rx       Sphere Cylinder Axis Add   Right -2.50 +2.00 105 +2.75   Left -1.50 +1.75 101 +2.75           IMAGING AND PROCEDURES  Imaging and Procedures for  OCT, Retina - OU - Both Eyes       Right Eye Quality was good. Central Foveal Thickness: 240. Progression has improved. Findings include normal foveal contour, no SRF, retinal drusen , intraretinal fluid, outer retinal atrophy (Mild persistent IRF/cystic changes SN macula -- slightly improved).   Left Eye Quality was good. Central Foveal Thickness: 256. Progression has been stable. Findings include normal foveal contour, no IRF, no SRF, retinal drusen , vitreomacular adhesion (Focal PED/drusen temporal fovea).   Notes *Images captured and stored on drive  Diagnosis / Impression:  OD: BRVO with Mild persistent IRF/cystic changes SN macula -- slightly improved OS: NFP, no SRF/IRF, drusen, VMA  Clinical management:  See below  Abbreviations: NFP - Normal foveal profile. CME - cystoid macular edema. PED - pigment epithelial detachment. IRF - intraretinal fluid. SRF - subretinal fluid. EZ - ellipsoid zone. ERM - epiretinal membrane. ORA - outer retinal atrophy. ORT - outer retinal tubulation. SRHM - subretinal hyper-reflective material       Intravitreal Injection, Pharmacologic Agent - OD - Right Eye       Time Out 10/14/2023. 3:18 PM. Confirmed correct patient, procedure, site, and patient consented.   Anesthesia Topical anesthesia was used. Anesthetic medications included Lidocaine 2%, Proparacaine 0.5%.   Procedure Preparation included 5% betadine to ocular surface, eyelid  speculum. A (32 g) needle was used.   Injection: 2 mg aflibercept 2 MG/0.05ML   Route: Intravitreal, Site: Right Eye   NDC: L6038910, Lot: [id number], Expiration date: 08/16/2024, Waste: 0 mL   Post-op Post injection exam found visual acuity of at least counting fingers. The patient tolerated the procedure well. There were no complications. The patient received written and verbal post procedure care education. Post injection medications were not given.            ASSESSMENT/PLAN:   ICD-10-CM   1. 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 Current use of insulin (HCC)  Z79.4     4. Long term (current) use of oral hypoglycemic drugs  Z79.84     5. Essential hypertension  I10     6. Hypertensive retinopathy of both eyes  H35.033     7. Pseudophakia of both eyes  Z96.1      1. BRVO with CME OD - s/p IVA OD #1 (01.19.21), #2 (02.16.21), #3 (03.16.21), #4 (4.13.21) -- IVA resistance  - s/p IVE OD #1 (05.11.21--sample), #2 (06.08.21), #3 (07.06.21), #4 (8.3.21), #5 (08.31.21), #6 (09.28.21), #7 (10.26.21), #8 (11.23.21), #9 (12.22.21), #10 (1.24.22), #11 (2.22.22), #12 (3.22.22), #13 (04.19.22), #14 (05.25.22), #15 (06.29.22), #16 (07.27.22), #17 (08.24.22), #18 (09.21.22), #19 (  10.19.22), #20 (11.22.22), #21 (12.20.22), #22 (01.18.23), #23 (02.22.23), #24 (03.28.23), #25 (05.09.23), #26 (06.20.23), #27 (08.07.23), #28 (10.02.23), #29 (11.27.23), #30 (01.22.24), #31 (03.18.24), #32 (05.13.24), #33 (07.08.24), #34 (09.03.24)  - s/p focal laser OD (07.13.22)  - BCVA OD 20/25 (s/p CEIOL) - stable  - exam with focal edema, IRH, CWS superonasal macula -- improved - OCT shows BRVO with Mild persistent IRF/cystic changes SN macula -- slightly improved at 8 weeks  -  recommend IVE OD #35 today (10.28.24) -- maintenance w/ f/u at 8 wks  - RBA of procedure discussed, questions answered  - Eylea informed consent form re-signed and scanned on 09.03.24  - see procedure note - Eylea4U benefits investigation started, 05.11.21 -- approved through [employer] for 2024 - F/U 8 wks -- DFE/OCT/possible injection; pt may benefit from repeat focal laser OD, but no great targets  2-4. Diabetes mellitus, type 2 without retinopathy OU - The incidence, risk factors for progression, natural history and treatment options for diabetic retinopathy  were discussed with patient.   - The need for close monitoring of blood glucose, blood pressure, and serum lipids, avoiding cigarette or any type of tobacco, and the need for long term follow up was also discussed with patient.  - monitor  5,6. Hypertensive retinopathy OU  - discussed importance of tight BP control  - monitor  7. Pseudophakia OU  - s/p CE/IOL (Dr. Sherrine Maples, OD: 06.05.24, OS: 06.25.24)  - IOLs in good position, doing well  - monitor  Ophthalmic Meds Ordered this visit:  Meds ordered this encounter  Medications   aflibercept (EYLEA) SOLN 2 mg     Return in about 8 weeks (around 12/09/2023) for f/u BRVO OD, DFE, OCT.  There are no Patient Instructions on file for this visit.  This document serves as a record of services personally performed by Karie Chimera, MD, PhD. It was created on their behalf by Annalee Genta, COMT. The creation of this record is the provider's dictation and/or activities during the visit.  Electronically signed by: Annalee Genta, COMT 10/15/23 11:18 PM  This document serves as a record of services personally performed by Karie Chimera, MD, PhD. It was created on their behalf by Glee Arvin. Manson Passey, OA an ophthalmic technician. The creation of this record is the provider's dictation and/or activities during the visit.    Electronically signed by: Glee Arvin. Manson Passey, OA 10/15/23 11:18  PM  Karie Chimera, M.D., Ph.D. Diseases & Surgery of the Retina and Vitreous [facility]  I have reviewed the above documentation for accuracy and completeness, and I agree with the above. Karie Chimera, M.D., Ph.D. 10/15/23 11:20 PM   Abbreviations: M myopia (nearsighted); A astigmatism; H hyperopia (farsighted); P presbyopia; Mrx spectacle prescription;  CTL contact lenses; OD right eye; OS left eye; OU both eyes  XT exotropia; ET esotropia; PEK punctate epithelial keratitis; PEE punctate epithelial erosions; DES dry eye syndrome; MGD meibomian gland dysfunction; ATs artificial tears; PFAT's preservative free artificial tears; NSC nuclear sclerotic cataract; PSC posterior subcapsular cataract; ERM epi-retinal membrane; PVD posterior vitreous detachment; RD retinal detachment; DM diabetes mellitus; DR diabetic retinopathy; NPDR non-proliferative diabetic retinopathy; PDR proliferative diabetic retinopathy; CSME clinically significant macular edema; DME diabetic macular edema; dbh dot blot hemorrhages; CWS cotton wool spot; POAG primary open angle glaucoma; C/D cup-to-disc ratio; HVF humphrey visual field; GVF goldmann visual field; OCT optical coherence tomography; IOP intraocular pressure; BRVO Branch retinal vein occlusion; CRVO central retinal vein occlusion;  CRAO central retinal artery occlusion; BRAO branch retinal artery occlusion; RT retinal tear; SB scleral buckle; PPV pars plana vitrectomy; VH Vitreous hemorrhage; PRP panretinal laser photocoagulation; IVK intravitreal kenalog; VMT vitreomacular traction; MH Macular hole;  NVD neovascularization of the disc; NVE neovascularization elsewhere; AREDS age related eye disease study; ARMD age related macular degeneration; POAG primary open angle glaucoma; EBMD epithelial/anterior basement membrane dystrophy; ACIOL anterior chamber intraocular lens; IOL intraocular lens; PCIOL posterior chamber intraocular lens; Phaco/IOL  phacoemulsification with intraocular lens placement; PRK photorefractive keratectomy; LASIK laser assisted in situ keratomileusis; HTN hypertension; DM diabetes mellitus; COPD chronic obstructive pulmonary disease

## 2023-10-14 ENCOUNTER — Ambulatory Visit (INDEPENDENT_AMBULATORY_CARE_PROVIDER_SITE_OTHER): Payer: Medicare Other | Admitting: Ophthalmology

## 2023-10-14 ENCOUNTER — Encounter (INDEPENDENT_AMBULATORY_CARE_PROVIDER_SITE_OTHER): Payer: Self-pay | Admitting: Ophthalmology

## 2023-10-14 DIAGNOSIS — Z961 Presence of intraocular lens: Secondary | ICD-10-CM

## 2023-10-14 DIAGNOSIS — E119 Type 2 diabetes mellitus without complications: Secondary | ICD-10-CM

## 2023-10-14 DIAGNOSIS — H35033 Hypertensive retinopathy, bilateral: Secondary | ICD-10-CM

## 2023-10-14 DIAGNOSIS — H34831 Tributary (branch) retinal vein occlusion, right eye, with macular edema: Secondary | ICD-10-CM | POA: Diagnosis not present

## 2023-10-14 DIAGNOSIS — Z794 Long term (current) use of insulin: Secondary | ICD-10-CM | POA: Diagnosis not present

## 2023-10-14 DIAGNOSIS — Z7984 Long term (current) use of oral hypoglycemic drugs: Secondary | ICD-10-CM

## 2023-10-14 DIAGNOSIS — I10 Essential (primary) hypertension: Secondary | ICD-10-CM

## 2023-10-14 MED ORDER — AFLIBERCEPT 2MG/0.05ML IZ SOLN FOR KALEIDOSCOPE
2.0000 mg | INTRAVITREAL | Status: AC | PRN
Start: 2023-10-14 — End: 2023-10-14
  Administered 2023-10-14: 2 mg via INTRAVITREAL

## 2023-10-16 ENCOUNTER — Ambulatory Visit (HOSPITAL_COMMUNITY)
Admission: RE | Admit: 2023-10-16 | Discharge: 2023-10-16 | Disposition: A | Discharge status: Home / Self Care | Payer: Medicare Other | Source: Ambulatory Visit | Attending: Vascular Surgery | Admitting: Vascular Surgery

## 2023-10-16 DIAGNOSIS — M7989 Other specified soft tissue disorders: Secondary | ICD-10-CM | POA: Insufficient documentation

## 2023-10-16 LAB — POCT I-STAT CREATININE
Creatinine, Ser: 1.1 mg/dL (ref 0.61–1.24)
Creatinine, Ser: 1.1 mg/dL (ref 0.61–1.24)

## 2023-10-16 MED ORDER — IOHEXOL 300 MG/ML  SOLN
100.0000 mL | Freq: Once | INTRAMUSCULAR | Status: AC | PRN
  Administered 2023-10-16: 100 mL via INTRAVENOUS

## 2023-10-23 ENCOUNTER — Ambulatory Visit (INDEPENDENT_AMBULATORY_CARE_PROVIDER_SITE_OTHER): Payer: Medicare Other | Admitting: Vascular Surgery

## 2023-10-23 ENCOUNTER — Encounter: Payer: Self-pay | Admitting: Vascular Surgery

## 2023-10-23 VITALS — BP 116/77 | HR 92 | Temp 98.4°F | Resp 20 | Ht 65.0 in | Wt 208.0 lb

## 2023-10-23 DIAGNOSIS — L03116 Cellulitis of left lower limb: Secondary | ICD-10-CM | POA: Diagnosis not present

## 2023-10-23 DIAGNOSIS — I872 Venous insufficiency (chronic) (peripheral): Secondary | ICD-10-CM

## 2023-10-23 NOTE — Progress Notes (Signed)
Patient ID: Kurt [name], male   DOB: 1950/12/22, 72 y.o.   MRN: [id number]  Reason for Consult: No chief complaint on file.   Referred by [facility]  Subjective:     HPI:  Kurt [name] is a 72 y.o. male with history of polio with right lower extremity weakness.  He has a history of bilateral ankle fractures on this date 18 years ago.  More recently has had cellulitis on 4 separate occasions affecting the left lower extremity and also has a history of a DVT in his right upper extremity not currently anticoagulated.  He does have a family history of varicose veins.  He walks with the help of a walker due to previous polio.  He does wear compression stockings of the left lower extremity but has some difficulty getting these on.  He states that he does have stable swelling with no current wounds or cellulitis in the left lower extremity.  Past Medical History:  Diagnosis Date   Arthritis    Bronchitis    Bronchitis    Cataract    OU   Concussion    late 1990's  after a fall   GERD (gastroesophageal reflux disease)    History of kidney stones    Hypertension    Hypertensive retinopathy    OU   Osteogenesis imperfecta    PONV (postoperative nausea and vomiting)    pt has post polio syndrome   Post-polio syndrome    Type 2 diabetes mellitus (HCC) 12/25/2018   Family History  Problem Relation Age of Onset   Hypertension Mother    Diabetes Brother    Past Surgical History:  Procedure Laterality Date   ANKLE FRACTURE SURGERY Bilateral    arm surgery Right    nerve surgery   COLONOSCOPY     ELBOW FRACTURE SURGERY Left    EYE MUSCLE SURGERY Left    LEG SURGERY Left    femur fracture with rod   REVERSE SHOULDER ARTHROPLASTY Right 11/10/2019   Procedure: RIGHT REVERSE SHOULDER ARTHROPLASTY;  Surgeon: Cammy Copa, MD;  Location: [facility] OR;  Service: Orthopedics;  Laterality: Right;   TONSILLECTOMY      Short Social History:  Social History   Tobacco  Use   Smoking status: Never   Smokeless tobacco: Never  Substance Use Topics   Alcohol use: Yes    Comment: 1 beer occasionally    Allergies  Allergen Reactions   Augmentin [Amoxicillin-Pot Clavulanate] Nausea And Vomiting   Iodine Hives    Pt had IV contrast 10/16/23 without any issue (per Dr Grace Isaac)   Tape Rash    Paper     Current Outpatient Medications  Medication Sig Dispense Refill   acetaminophen (TYLENOL) 650 MG CR tablet Take 1,300 mg by mouth every 8 (eight) hours as needed for pain.     aspirin 81 MG chewable tablet Chew 1 tablet (81 mg total) by mouth daily. 30 tablet 0   B-D ULTRAFINE III SHORT PEN 31G X 8 MM MISC Inject into the skin.     docusate sodium (COLACE) 100 MG capsule Take 100 mg by mouth 2 (two) times daily.     glipiZIDE (GLUCOTROL XL) 10 MG 24 hr tablet Take 1 tablet (10 mg total) by mouth daily with breakfast.     HYDROcodone-acetaminophen (NORCO) 7.5-325 MG tablet Take 1 tablet by mouth daily as needed for moderate pain.     ketoconazole (NIZORAL) 2 % cream Apply 1 Application  topically daily.     LANTUS SOLOSTAR 100 UNIT/ML Solostar Pen Inject 34 Units into the skin at bedtime.     lisinopril (ZESTRIL) 20 MG tablet Take 20 mg by mouth daily.     methocarbamol (ROBAXIN) 500 MG tablet Take 500 mg by mouth every 8 (eight) hours as needed for muscle spasms.     Multiple Vitamin (MULTIVITAMIN) tablet Take 1 tablet by mouth daily.     Omega-3 Fatty Acids (FISH OIL) 1000 MG CAPS Take by mouth.     ONETOUCH ULTRA test strip 1 each 2 (two) times daily.     simvastatin (ZOCOR) 20 MG tablet Take 20 mg by mouth every evening.     SitaGLIPtin-MetFORMIN HCl [phone] MG TB24 Take 1 tablet by mouth at bedtime.     zinc gluconate 50 MG tablet Take 50 mg by mouth daily.     No current facility-administered medications for this visit.    Review of Systems  Constitutional:  Constitutional negative. HENT: HENT negative.  Eyes: Eyes negative.  Respiratory:  Respiratory negative.  Cardiovascular: Positive for leg swelling.  GI: Gastrointestinal negative.  Musculoskeletal: Positive for leg pain.  Skin: Skin negative.  Neurological: Neurological negative. Psychiatric: Psychiatric negative.        Objective:  Objective   Vitals:   10/23/23 1225  BP: 116/77  Pulse: 92  Resp: 20  Temp: 98.4 F (36.9 C)  SpO2: 96%     Physical Exam HENT:     Nose: Nose normal.     Mouth/Throat:     Mouth: Mucous membranes are moist.  Eyes:     Pupils: Pupils are equal, round, and reactive to light.  Pulmonary:     Effort: Pulmonary effort is normal.  Abdominal:     General: Abdomen is flat.     Palpations: Abdomen is soft.  Musculoskeletal:     Cervical back: Normal range of motion.     Right lower leg: No edema.     Left lower leg: Edema present.  Skin:    General: Skin is warm.     Capillary Refill: Capillary refill takes less than 2 seconds.  Neurological:     General: No focal deficit present.     Mental Status: He is alert.  Psychiatric:        Mood and Affect: Mood normal.        Data: LEFT          Reflux NoRefluxReflux TimeDiameter cmsComments                          Yes                                   +--------------+---------+------+-----------+------------+--------+  CFV                    yes                                   +--------------+---------+------+-----------+------------+--------+  FV mid        no                                              +--------------+---------+------+-----------+------------+--------+  Popliteal    no                                              +--------------+---------+------+-----------+------------+--------+  GSV at [facility]              yes    >500 ms      0.83              +--------------+---------+------+-----------+------------+--------+  GSV prox thigh          yes    >500 ms      0.56               +--------------+---------+------+-----------+------------+--------+  GSV mid thigh no                            0.44              +--------------+---------+------+-----------+------------+--------+  GSV dist thighno                            0.37              +--------------+---------+------+-----------+------------+--------+  GSV at knee   no                            0.33              +--------------+---------+------+-----------+------------+--------+  GSV prox calf no                            0.28              +--------------+---------+------+-----------+------------+--------+  SSV Pop Fossa no                            0.50              +--------------+---------+------+-----------+------------+--------+  SSV prox calf no                            0.40              +--------------+---------+------+-----------+------------+--------+         Summary:  Left:  - No evidence of deep vein thrombosis seen in the left lower extremity,  from the common femoral through the popliteal veins.  - No evidence of superficial venous thrombosis in the left lower  extremity.  - The deep venous system is incompetent at teh common femoral vein.  - The great saphenous vein is incompetent at the saphenofemoral junction  and proximal thigh.  - The small saphenous vein is competent.      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  I evaluated his great saphenous vein at bedside actually appears to be approximately 0.5 cm starting above the knee is within the fascia but somewhat superficial.  I discussed the need for great saphenous vein ablation from the knee to the saphenofemoral junction as 1 means of preventing recurrent cellulitis and assisting with edema.  He will need continued lifelong compression stockings particularly of the left lower  extremity demonstrates  good understanding of this.     Maeola Harman MD [facility]

## 2023-11-07 ENCOUNTER — Other Ambulatory Visit: Payer: Self-pay | Admitting: *Deleted

## 2023-11-07 DIAGNOSIS — I83812 Varicose veins of left lower extremities with pain: Secondary | ICD-10-CM

## 2023-12-06 NOTE — Progress Notes (Signed)
[facility] - Clinic Note  12/16/2023     CHIEF COMPLAINT Patient presents for Retina Follow Up  HISTORY OF PRESENT ILLNESS: Kurt [name] is a 72 y.o. male who presents to the clinic today for:  HPI     Retina Follow Up   Patient presents with  CRVO/BRVO.  In right eye.  This started 8 weeks ago.  Duration of 8 weeks.  Since onset it is stable.  I, the attending physician,  performed the HPI with the patient and updated documentation appropriately.        Comments   8 week retina follow up BRVO OD and I'VE OD pt is reporting some blurred vision he denies any flashes or floaters last reading 102 few days ago       Last edited by Rennis Chris, MD on 12/16/2023  4:57 PM.     Patient states   Referring physician:  [facility] [address] [place],  [place] [address]  HISTORICAL INFORMATION:   Selected notes from the MEDICAL RECORD NUMBER Diabetic Eval per Dr. Karleen Hampshire   CURRENT MEDICATIONS: No current outpatient medications on file. (Ophthalmic Drugs)   No current facility-administered medications for this visit. (Ophthalmic Drugs)   Current Outpatient Medications (Other)  Medication Sig   acetaminophen (TYLENOL) 650 MG CR tablet Take 1,300 mg by mouth every 8 (eight) hours as needed for pain.   aspirin 81 MG chewable tablet Chew 1 tablet (81 mg total) by mouth daily.   B-D ULTRAFINE III SHORT PEN 31G X 8 MM MISC Inject into the skin.   docusate sodium (COLACE) 100 MG capsule Take 100 mg by mouth 2 (two) times daily.   glipiZIDE (GLUCOTROL XL) 10 MG 24 hr tablet Take 1 tablet (10 mg total) by mouth daily with breakfast.   HYDROcodone-acetaminophen (NORCO) 7.5-325 MG tablet Take 1 tablet by mouth daily as needed for moderate pain.   ketoconazole (NIZORAL) 2 % cream Apply 1 Application topically daily.   LANTUS SOLOSTAR 100 UNIT/ML Solostar Pen Inject 34 Units into the skin at bedtime.   lisinopril (ZESTRIL) 20 MG tablet  Take 20 mg by mouth daily.   methocarbamol (ROBAXIN) 500 MG tablet Take 500 mg by mouth every 8 (eight) hours as needed for muscle spasms.   Multiple Vitamin (MULTIVITAMIN) tablet Take 1 tablet by mouth daily.   Omega-3 Fatty Acids (FISH OIL) 1000 MG CAPS Take by mouth.   ONETOUCH ULTRA test strip 1 each 2 (two) times daily.   simvastatin (ZOCOR) 20 MG tablet Take 20 mg by mouth every evening.   SitaGLIPtin-MetFORMIN HCl 937 [id number] MG TB24 Take 1 tablet by mouth at bedtime.   zinc gluconate 50 MG tablet Take 50 mg by mouth daily.   No current facility-administered medications for this visit. (Other)   REVIEW OF SYSTEMS: ROS   Positive for: Musculoskeletal, Endocrine, Eyes Negative for: Constitutional, Gastrointestinal, Neurological, Skin, Genitourinary, HENT, Cardiovascular, Respiratory, Psychiatric, Allergic/Imm, Heme/Lymph Last edited by Etheleen Mayhew, COT on 12/16/2023  2:28 PM.     ALLERGIES Allergies  Allergen Reactions   Augmentin [Amoxicillin-Pot Clavulanate] Nausea And Vomiting   Iodine Hives    Pt had IV contrast 10/16/23 without any issue (per Dr Grace Isaac)   Tape Rash    Paper    PAST MEDICAL HISTORY Past Medical History:  Diagnosis Date   Arthritis    Bronchitis    Bronchitis    Cataract    OU   Concussion  late 1990's  after a fall   GERD (gastroesophageal reflux disease)    History of kidney stones    Hypertension    Hypertensive retinopathy    OU   Osteogenesis imperfecta    PONV (postoperative nausea and vomiting)    pt has post polio syndrome   Post-polio syndrome    Type 2 diabetes mellitus (HCC) 12/25/2018   Past Surgical History:  Procedure Laterality Date   ANKLE FRACTURE SURGERY Bilateral    arm surgery Right    nerve surgery   COLONOSCOPY     ELBOW FRACTURE SURGERY Left    EYE MUSCLE SURGERY Left    LEG SURGERY Left    femur fracture with rod   REVERSE SHOULDER ARTHROPLASTY Right 11/10/2019   Procedure: RIGHT REVERSE SHOULDER  ARTHROPLASTY;  Surgeon: Cammy Copa, MD;  Location: [facility] OR;  Service: Orthopedics;  Laterality: Right;   TONSILLECTOMY     FAMILY HISTORY Family History  Problem Relation Age of Onset   Hypertension Mother    Diabetes Brother    SOCIAL HISTORY Social History   Tobacco Use   Smoking status: Never   Smokeless tobacco: Never  Vaping Use   Vaping status: Never Used  Substance Use Topics   Alcohol use: Yes    Comment: 1 beer occasionally   Drug use: No       OPHTHALMIC EXAM: Base Eye Exam     Visual Acuity (Snellen - Linear)       Right Left   Dist cc 20/30 -1 20/25   Dist ph cc 20/25 -2 20/20 -1    Correction: Glasses         Tonometry (Tonopen, 2:42 PM)       Right Left   Pressure 8 9         Pupils       Pupils Dark Light Shape React APD   Right PERRL 3 2 Round Brisk None   Left PERRL 3 2 Round Brisk None         Visual Fields       Left Right    Full Full         Extraocular Movement       Right Left    Full Full         Neuro/Psych     Oriented x3: Yes   Mood/Affect: Normal         Dilation     Both eyes: 2.5% Phenylephrine @ 2:43 PM           Slit Lamp and Fundus Exam     Slit Lamp Exam       Right Left   Lids/Lashes Dermatochalasis - upper lid, mild Meibomian gland dysfunction Dermatochalasis - upper lid, Dermatochalasis - lower lid, mild Meibomian gland dysfunction   Conjunctiva/Sclera blue sclera blue sclera   Cornea arcus, well healed cataract wound, trace Punctate epithelial erosions, Debris in tear film arcus, well healed cataract wound, trace Debris in tear film   Anterior Chamber deep and clear deep and clear   Iris round and dilated, no NVI round and dilated, no NVI   Lens PC IOL in good position PC IOL in good position   Anterior Vitreous mild syneresis, PVD, vitreous condensations mild syneresis         Fundus Exam       Right Left   Disc pink and sharp mild Pallor, Sharp rim   C/D Ratio 0.5  0.4   Macula  Good foveal reflex; trace persistent edema SN macula -- slightly improved, mild focal laser changes, no heme -- no great targets for focal laser flat, good foveal reflex, RPE mottling and clumping, fine drusen, No heme or edema   Vessels attenuated, Tortuous attenuated, Tortuous   Periphery attached, no heme attached, no heme           Refraction     Wearing Rx       Sphere Cylinder Axis Add   Right -2.50 +2.00 105 +2.75   Left -1.50 +1.75 101 +2.75           IMAGING AND PROCEDURES  Imaging and Procedures for  OCT, Retina - OU - Both Eyes       Right Eye Quality was good. Central Foveal Thickness: 241. Progression has been stable. Findings include normal foveal contour, no SRF, retinal drusen , intraretinal fluid, outer retinal atrophy (Mild persistent IRF/cystic changes SN macula ).   Left Eye Quality was good. Central Foveal Thickness: 246. Progression has been stable. Findings include normal foveal contour, no IRF, no SRF, retinal drusen , vitreomacular adhesion (Focal PED/drusen temporal fovea, no fluid).   Notes *Images captured and stored on drive  Diagnosis / Impression:  OD: BRVO with Mild persistent IRF/cystic changes SN macula  OS: NFP, no SRF/IRF, Focal PED/drusen temporal fovea, no fluid  Clinical management:  See below  Abbreviations: NFP - Normal foveal profile. CME - cystoid macular edema. PED - pigment epithelial detachment. IRF - intraretinal fluid. SRF - subretinal fluid. EZ - ellipsoid zone. ERM - epiretinal membrane. ORA - outer retinal atrophy. ORT - outer retinal tubulation. SRHM - subretinal hyper-reflective material       Intravitreal Injection, Pharmacologic Agent - OD - Right Eye       Time Out 12/16/2023. 3:36 PM. Confirmed correct patient, procedure, site, and patient consented.   Anesthesia Topical anesthesia was used. Anesthetic medications included Lidocaine 2%, Proparacaine 0.5%.   Procedure Preparation included  5% betadine to ocular surface, eyelid speculum. A (32 g) needle was used.   Injection: 2 mg aflibercept 2 MG/0.05ML   Route: Intravitreal, Site: Right Eye   NDC: L6038910, Lot: [id number], Expiration date: 04/14/2025, Waste: 0 mL   Post-op Post injection exam found visual acuity of at least counting fingers. The patient tolerated the procedure well. There were no complications. The patient received written and verbal post procedure care education. Post injection medications were not given.            ASSESSMENT/PLAN:   ICD-10-CM   1. 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 Current use of insulin (HCC)  Z79.4     4. Long term (current) use of oral hypoglycemic drugs  Z79.84     5. Essential hypertension  I10     6. Hypertensive retinopathy of both eyes  H35.033     7. Pseudophakia of both eyes  Z96.1      1. BRVO with CME OD - s/p IVA OD #1 (01.19.21), #2 (02.16.21), #3 (03.16.21), #4 (4.13.21) -- IVA resistance  - s/p IVE OD #1 (05.11.21--sample), #2 (06.08.21), #3 (07.06.21), #4 (8.3.21), #5 (08.31.21), #6 (09.28.21), #7 (10.26.21), #8 (11.23.21), #9 (12.22.21), #10 (1.24.22), #11 (2.22.22), #  12 (3.22.22), #13 (04.19.22), #14 (05.25.22), #15 (06.29.22), #16 (07.27.22), #17 (08.24.22), #18 (09.21.22), #19 (10.19.22), #20 (11.22.22), #21 (12.20.22), #22 (01.18.23), #23 (02.22.23), #24 (03.28.23), #25 (05.09.23), #26 (06.20.23), #27 (08.07.23), #28 (10.02.23), #29 (11.27.23), #30 (01.22.24), #31 (03.18.24), #32 (05.13.24), #33 (07.08.24), #34 (09.03.24), #35 (10.28.24)  - s/p focal laser OD (07.13.22)  - BCVA OD 20/25 (s/p CEIOL) - stable  - exam with focal edema, IRH, CWS superonasal macula -- improved - OCT shows BRVO with Mild persistent IRF/cystic  changes SN macula at 9 weeks  - recommend IVE OD #36 today (12.30.24) -- maintenance w/ f/u in 9 wks again  - RBA of procedure discussed, questions answered  - Eylea informed consent form re-signed and scanned on 09.03.24  - see procedure note - Eylea4U benefits investigation started, 05.11.21 -- approved through [employer] for 2024 - F/U 9 wks -- DFE/OCT/possible injection; pt may benefit from repeat focal laser OD, but no great targets  2-4. Diabetes mellitus, type 2 without retinopathy OU - The incidence, risk factors for progression, natural history and treatment options for diabetic retinopathy  were discussed with patient.   - The need for close monitoring of blood glucose, blood pressure, and serum lipids, avoiding cigarette or any type of tobacco, and the need for long term follow up was also discussed with patient.  - monitor  5,6. Hypertensive retinopathy OU  - discussed importance of tight BP control  - monitor  7. Pseudophakia OU  - s/p CE/IOL (Dr. Sherrine Maples, OD: 06.05.24, OS: 06.25.24)  - IOLs in good position, doing well  - monitor  Ophthalmic Meds Ordered this visit:  Meds ordered this encounter  Medications   aflibercept (EYLEA) SOLN 2 mg     Return in about 8 weeks (around 02/10/2024) for f/u BRVO OD, DFE, OCT.  There are no Patient Instructions on file for this visit.  This document serves as a record of services personally performed by Karie Chimera, MD, PhD. It was created on their behalf by De Blanch, an ophthalmic technician. The creation of this record is the provider's dictation and/or activities during the visit.    Electronically signed by: De Blanch, OA, 12/16/23  4:58 PM  This document serves as a record of services personally performed by Karie Chimera, MD, PhD. It was created on their behalf by Glee Arvin. Manson Passey, OA an ophthalmic technician. The creation of this record is the provider's dictation and/or activities during the visit.     Electronically signed by: Glee Arvin. Manson Passey, OA 12/16/23 4:58 PM  Karie Chimera, M.D., Ph.D. Diseases & Surgery of the Retina and Vitreous [facility]  I have reviewed the above documentation for accuracy and completeness, and I agree with the above. Karie Chimera, M.D., Ph.D. 12/16/23 4:59 PM   Abbreviations: M myopia (nearsighted); A astigmatism; H hyperopia (farsighted); P presbyopia; Mrx spectacle prescription;  CTL contact lenses; OD right eye; OS left eye; OU both eyes  XT exotropia; ET esotropia; PEK punctate epithelial keratitis; PEE punctate epithelial erosions; DES dry eye syndrome; MGD meibomian gland dysfunction; ATs artificial tears; PFAT's preservative free artificial tears; NSC nuclear sclerotic cataract; PSC posterior subcapsular cataract; ERM epi-retinal membrane; PVD posterior vitreous detachment; RD retinal detachment; DM diabetes mellitus; DR diabetic retinopathy; NPDR non-proliferative diabetic retinopathy; PDR proliferative diabetic retinopathy; CSME clinically significant macular edema; DME diabetic macular edema; dbh dot blot hemorrhages; CWS cotton wool spot; POAG primary open angle glaucoma; C/D cup-to-disc ratio; HVF humphrey visual field; GVF  goldmann visual field; OCT optical coherence tomography; IOP intraocular pressure; BRVO Branch retinal vein occlusion; CRVO central retinal vein occlusion; CRAO central retinal artery occlusion; BRAO branch retinal artery occlusion; RT retinal tear; SB scleral buckle; PPV pars plana vitrectomy; VH Vitreous hemorrhage; PRP panretinal laser photocoagulation; IVK intravitreal kenalog; VMT vitreomacular traction; MH Macular hole;  NVD neovascularization of the disc; NVE neovascularization elsewhere; AREDS age related eye disease study; ARMD age related macular degeneration; POAG primary open angle glaucoma; EBMD epithelial/anterior basement membrane dystrophy; ACIOL anterior chamber intraocular lens; IOL intraocular  lens; PCIOL posterior chamber intraocular lens; Phaco/IOL phacoemulsification with intraocular lens placement; PRK photorefractive keratectomy; LASIK laser assisted in situ keratomileusis; HTN hypertension; DM diabetes mellitus; COPD chronic obstructive pulmonary disease

## 2023-12-16 ENCOUNTER — Ambulatory Visit (INDEPENDENT_AMBULATORY_CARE_PROVIDER_SITE_OTHER): Payer: Medicare Other | Admitting: Ophthalmology

## 2023-12-16 ENCOUNTER — Encounter (INDEPENDENT_AMBULATORY_CARE_PROVIDER_SITE_OTHER): Payer: Self-pay | Admitting: Ophthalmology

## 2023-12-16 DIAGNOSIS — Z961 Presence of intraocular lens: Secondary | ICD-10-CM

## 2023-12-16 DIAGNOSIS — E119 Type 2 diabetes mellitus without complications: Secondary | ICD-10-CM | POA: Diagnosis not present

## 2023-12-16 DIAGNOSIS — Z7984 Long term (current) use of oral hypoglycemic drugs: Secondary | ICD-10-CM

## 2023-12-16 DIAGNOSIS — Z794 Long term (current) use of insulin: Secondary | ICD-10-CM | POA: Diagnosis not present

## 2023-12-16 DIAGNOSIS — H34831 Tributary (branch) retinal vein occlusion, right eye, with macular edema: Secondary | ICD-10-CM

## 2023-12-16 DIAGNOSIS — I10 Essential (primary) hypertension: Secondary | ICD-10-CM

## 2023-12-16 DIAGNOSIS — H35033 Hypertensive retinopathy, bilateral: Secondary | ICD-10-CM

## 2023-12-16 MED ORDER — AFLIBERCEPT 2MG/0.05ML IZ SOLN FOR KALEIDOSCOPE
2.0000 mg | INTRAVITREAL | Status: AC | PRN
  Administered 2023-12-16: 2 mg via INTRAVITREAL

## 2024-01-14 ENCOUNTER — Other Ambulatory Visit: Payer: Self-pay | Admitting: *Deleted

## 2024-01-14 MED ORDER — LORAZEPAM 1 MG PO TABS: ORAL_TABLET | ORAL | 0 refills | Status: AC

## 2024-01-23 ENCOUNTER — Encounter: Payer: Self-pay | Admitting: Vascular Surgery

## 2024-01-23 ENCOUNTER — Ambulatory Visit: Payer: Medicare Other | Admitting: Vascular Surgery

## 2024-01-23 VITALS — BP 121/69 | HR 86 | Temp 98.0°F | Resp 20 | Ht 65.0 in | Wt 208.0 lb

## 2024-01-23 DIAGNOSIS — I83812 Varicose veins of left lower extremities with pain: Secondary | ICD-10-CM

## 2024-01-23 NOTE — Progress Notes (Signed)
 Patient name: Kurt [name] MRN: [id number] DOB: Nov 14, 1951 Sex: male  REASON FOR VISIT: Treatment of of left lower extremity venous reflux  HPI: Kurt [name] is a 73 y.o. male with history of C4 venous disease with history of cellulitis and edema of the left lower extremity.  He has been compliant with thigh-high compression stockings.  Previous CVT venogram did not demonstrate May-Thurner configuration.  He has been evaluated with venous reflux testing found to have a great saphenous vein in the left lower extremity that measures 0.5 cm throughout the upper leg and is refluxing throughout.  He is now indicated for great saphenous vein ablation to prevent recurrent cellulitis.  Current Outpatient Medications  Medication Sig Dispense Refill   acetaminophen  (TYLENOL ) 650 MG CR tablet Take 1,300 mg by mouth every 8 (eight) hours as needed for pain.     aspirin  81 MG chewable tablet Chew 1 tablet (81 mg total) by mouth daily. 30 tablet 0   B-D ULTRAFINE III SHORT PEN 31G X 8 MM MISC Inject into the skin.     docusate sodium  (COLACE) 100 MG capsule Take 100 mg by mouth 2 (two) times daily.     glipiZIDE  (GLUCOTROL  XL) 10 MG 24 hr tablet Take 1 tablet (10 mg total) by mouth daily with breakfast.     HYDROcodone -acetaminophen  (NORCO) 7.5-325 MG tablet Take 1 tablet by mouth daily as needed for moderate pain.     ketoconazole (NIZORAL) 2 % cream Apply 1 Application topically daily.     LANTUS  SOLOSTAR 100 UNIT/ML Solostar Pen Inject 34 Units into the skin at bedtime.     lisinopril  (ZESTRIL ) 20 MG tablet Take 20 mg by mouth daily.     LORazepam  (ATIVAN ) 1 MG tablet Take 1 tablet 30 to 60 minutes before leaving house on day of office surgery.  Bring second tablet with you to office on day of office surgery. 2 tablet 0   methocarbamol  (ROBAXIN ) 500 MG tablet Take 500 mg by mouth every 8 (eight) hours as needed for muscle spasms.     Multiple Vitamin (MULTIVITAMIN) tablet Take 1 tablet by  mouth daily.     Omega-3 Fatty Acids (FISH OIL) 1000 MG CAPS Take by mouth.     ONETOUCH ULTRA test strip 1 each 2 (two) times daily.     simvastatin  (ZOCOR ) 20 MG tablet Take 20 mg by mouth every evening.     SitaGLIPtin -MetFORMIN  HCl 604 [id number] 5558 MG TB24 Take 1 tablet by mouth at bedtime.     zinc gluconate 50 MG tablet Take 50 mg by mouth daily.     No current facility-administered medications for this visit.    PHYSICAL EXAM: Vitals:   01/23/24 1054  BP: 121/69  Pulse: 86  Resp: 20  Temp: 98 F (36.7 C)  SpO2: 97%   Awake alert and oriented Nonlabored respirations Right great saphenous vein marked with patient lying supine   PROCEDURE: Left saphenous vein laser ablation totaling 27 cm  TECHNIQUE: Kurt [name] was taken to the procedure room where he was initially placed supine on the procedural table and ultrasound was used to identify the left great saphenous vein which was noted to be outside the fascia above the knee but still quite deep and this was evaluated within the fascia from the mid thigh up all the way to the saphenofemoral junction.  He was then sterilely prepped and draped in the left lower extremity in the usual fashion, timeout was called.  Ultrasound was again to reidentify the saphenous vein and the area was anesthetized and this was cannulated with a micropuncture needle followed by wire and sheath.  We then placed a J-wire up to 3 cm from the saphenofemoral junction and laser introducer sheath was placed under ultrasound guidance.  The laser was then placed to a total of 27 cm.  The area along the catheter was anesthetized with tumescent anesthesia.  The vein was then ablated for a total of 27 cm.  At completion the common femoral vein was noted to be patent and compressible.  Sterile wrap was placed around the left lower extremity he tolerated well.  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  Kurt [name] [facility] [phone]

## 2024-01-23 NOTE — Progress Notes (Signed)
     Laser Ablation Procedure    Date: 01/23/2024   Kurt [name] DOB:Oct 26, 1951  Consent signed: Yes      Surgeon: Penne Colorado, MD  Procedure: Laser Ablation: left Greater Saphenous Vein  There were no vitals taken for this visit.  Tumescent Anesthesia: 400 cc 0.9% NaCl with 50 cc Lidocaine  HCL 1%  and 15 cc 8.4% NaHCO3  Local Anesthesia: 6 cc Lidocaine  HCL and NaHCO3 (ratio 2:1)  7 watts continuous mode     Total energy: 1358 joules    Total time: 194 sec Treatment Length 27cm  Laser Fiber Ref. #  [id number]    Lot # [id number]   Patient tolerated procedure well  Notes:  All staff members wore facial masks and facial shields/goggles.  Patient took Ativan  1mg  by mouth @ 9am and took 1/2 tab= Ativan  0.5mg  @ 10:35am.   Description of Procedure:  After marking the course of the secondary varicosities, the patient was placed on the operating table in the supine position, and the left leg was prepped and draped in sterile fashion.   Local anesthetic was administered and under ultrasound guidance the saphenous vein was accessed with a micro needle and guide wire; then the mirco puncture sheath was placed.  A guide wire was inserted saphenofemoral junction , followed by a 5 french sheath.  The position of the sheath and then the laser fiber below the junction was confirmed using the ultrasound.  Tumescent anesthesia was administered along the course of the saphenous vein using ultrasound guidance. The patient was placed in Trendelenburg position and protective laser glasses were placed on patient and staff, and the laser was fired at 7 watts continuous mode for a total of 1358 joules.  Steri strips were applied to the access site with  ABD pads and thigh high compression stockings were applied.  Ace wrap bandages were applied  at the top of the saphenofemoral junction. Blood loss was less than 15 cc.  Discharge instructions reviewed with patient and hardcopy of discharge instructions given  to patient to take home. The patient ambulated with walker out of the operating room having tolerated the procedure well.

## 2024-02-06 ENCOUNTER — Ambulatory Visit (HOSPITAL_COMMUNITY): Payer: Medicare Other

## 2024-02-06 ENCOUNTER — Encounter: Payer: Medicare Other | Admitting: Vascular Surgery

## 2024-02-06 NOTE — Progress Notes (Shared)
[facility] - Clinic Note  02/10/2024     CHIEF COMPLAINT Patient presents for No chief complaint on file.  HISTORY OF PRESENT ILLNESS: Kurt [name] is a 73 y.o. male who presents to the clinic today for:    Patient states   Referring physician:  [facility] [address] [place],  [place] [address]  HISTORICAL INFORMATION:   Selected notes from the MEDICAL RECORD NUMBER Diabetic Eval per Dr. Karleen Hampshire   CURRENT MEDICATIONS: No current outpatient medications on file. (Ophthalmic Drugs)   No current facility-administered medications for this visit. (Ophthalmic Drugs)   Current Outpatient Medications (Other)  Medication Sig   acetaminophen (TYLENOL) 650 MG CR tablet Take 1,300 mg by mouth every 8 (eight) hours as needed for pain.   aspirin 81 MG chewable tablet Chew 1 tablet (81 mg total) by mouth daily.   B-D ULTRAFINE III SHORT PEN 31G X 8 MM MISC Inject into the skin.   docusate sodium (COLACE) 100 MG capsule Take 100 mg by mouth 2 (two) times daily.   glipiZIDE (GLUCOTROL XL) 10 MG 24 hr tablet Take 1 tablet (10 mg total) by mouth daily with breakfast.   HYDROcodone-acetaminophen (NORCO) 7.5-325 MG tablet Take 1 tablet by mouth daily as needed for moderate pain.   ketoconazole (NIZORAL) 2 % cream Apply 1 Application topically daily.   LANTUS SOLOSTAR 100 UNIT/ML Solostar Pen Inject 34 Units into the skin at bedtime.   lisinopril (ZESTRIL) 20 MG tablet Take 20 mg by mouth daily.   LORazepam (ATIVAN) 1 MG tablet Take 1 tablet 30 to 60 minutes before leaving house on day of office surgery.  Bring second tablet with you to office on day of office surgery.   methocarbamol (ROBAXIN) 500 MG tablet Take 500 mg by mouth every 8 (eight) hours as needed for muscle spasms.   Multiple Vitamin (MULTIVITAMIN) tablet Take 1 tablet by mouth daily.   Omega-3 Fatty Acids (FISH OIL) 1000 MG CAPS Take by mouth.   ONETOUCH ULTRA test strip 1 each 2  (two) times daily.   simvastatin (ZOCOR) 20 MG tablet Take 20 mg by mouth every evening.   SitaGLIPtin-MetFORMIN HCl [phone] MG TB24 Take 1 tablet by mouth at bedtime.   zinc gluconate 50 MG tablet Take 50 mg by mouth daily.   No current facility-administered medications for this visit. (Other)   REVIEW OF SYSTEMS:   ALLERGIES Allergies  Allergen Reactions   Augmentin [Amoxicillin-Pot Clavulanate] Nausea And Vomiting   Iodine Hives    Pt had IV contrast 10/16/23 without any issue (per Dr Grace Isaac)   Tape Rash    Paper    PAST MEDICAL HISTORY Past Medical History:  Diagnosis Date   Arthritis    Bronchitis    Bronchitis    Cataract    OU   Concussion    late 1990's  after a fall   GERD (gastroesophageal reflux disease)    History of kidney stones    Hypertension    Hypertensive retinopathy    OU   Osteogenesis imperfecta    Peripheral vascular disease (HCC)    PONV (postoperative nausea and vomiting)    pt has post polio syndrome   Post-polio syndrome    Type 2 diabetes mellitus (HCC) 12/25/2018   Past Surgical History:  Procedure Laterality Date   ANKLE FRACTURE SURGERY Bilateral    arm surgery Right    nerve surgery   COLONOSCOPY  ELBOW FRACTURE SURGERY Left    EYE MUSCLE SURGERY Left    LEG SURGERY Left    femur fracture with rod   REVERSE SHOULDER ARTHROPLASTY Right 11/10/2019   Procedure: RIGHT REVERSE SHOULDER ARTHROPLASTY;  Surgeon: Cammy Copa, MD;  Location: [facility] OR;  Service: Orthopedics;  Laterality: Right;   TONSILLECTOMY     FAMILY HISTORY Family History  Problem Relation Age of Onset   Hypertension Mother    Diabetes Brother    SOCIAL HISTORY Social History   Tobacco Use   Smoking status: Never   Smokeless tobacco: Never  Vaping Use   Vaping status: Never Used  Substance Use Topics   Alcohol use: Yes    Comment: 1 beer occasionally   Drug use: No       OPHTHALMIC EXAM: Not recorded    IMAGING AND PROCEDURES  Imaging  and Procedures for          ASSESSMENT/PLAN:   ICD-10-CM   1. Branch retinal vein occlusion of right eye with macular edema  H34.8310     2. 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 Current use of insulin (HCC)  Z79.4     4. Long term (current) use of oral hypoglycemic drugs  Z79.84     5. Essential hypertension  I10     6. Hypertensive retinopathy of both eyes  H35.033     7. Pseudophakia of both eyes  Z96.1      1. BRVO with CME OD - s/p IVA OD #1 (01.19.21), #2 (02.16.21), #3 (03.16.21), #4 (4.13.21) -- IVA resistance  - s/p IVE OD #1 (05.11.21--sample), #2 (06.08.21), #3 (07.06.21), #4 (8.3.21), #5 (08.31.21), #6 (09.28.21), #7 (10.26.21), #8 (11.23.21), #9 (12.22.21), #10 (1.24.22), #11 (2.22.22), #12 (3.22.22), #13 (04.19.22), #14 (05.25.22), #15 (06.29.22), #16 (07.27.22), #17 (08.24.22), #18 (09.21.22), #19 (10.19.22), #20 (11.22.22), #21 (12.20.22), #22 (01.18.23), #23 (02.22.23), #24 (03.28.23), #25 (05.09.23), #26 (06.20.23), #27 (08.07.23), #28 (10.02.23), #29 (11.27.23), #30 (01.22.24), #31 (03.18.24), #32 (05.13.24), #33 (07.08.24), #34 (09.03.24), #35 (10.28.24), #36 (12.30.24)  - s/p focal laser OD (07.13.22)  - BCVA OD 20/25 (s/p CEIOL) - stable  - exam with focal edema, IRH, CWS superonasal macula -- improved - OCT shows BRVO with Mild persistent IRF/cystic changes SN macula at 9 weeks  - recommend IVE OD #37 today (02.24.25) -- maintenance w/ f/u in 9 wks again  - RBA of procedure discussed, questions answered  - [employer] informed consent form re-signed and scanned on 09.03.24  - see procedure note - [employer] benefits investigation started, 05.11.21 -- approved through [employer] for 2024 - F/U 9 wks -- DFE/OCT/possible injection; pt may benefit from repeat focal laser OD, but no great targets  2-4. Diabetes mellitus, type 2 without retinopathy OU - The incidence, risk factors for progression, natural history and treatment options for  diabetic retinopathy  were discussed with patient.   - The need for close monitoring of blood glucose, blood pressure, and serum lipids, avoiding cigarette or any type of tobacco, and the need for long term follow up was also discussed with patient.  - monitor  5,6. Hypertensive retinopathy OU  - discussed importance of tight BP control  - monitor  7. Pseudophakia OU  - s/p CE/IOL (Dr. Sherrine Maples, OD: 06.05.24, OS: 06.25.24)  - IOLs in good position, doing well  - monitor  Ophthalmic Meds Ordered this visit:  No orders of the defined types were placed in this encounter.  No follow-ups on file.  There are no Patient Instructions on file for this visit.  This document serves as a record of services personally performed by Karie Chimera, MD, PhD. It was created on their behalf by Glee Arvin. Manson Passey, OA an ophthalmic technician. The creation of this record is the provider's dictation and/or activities during the visit.    Electronically signed by: Glee Arvin. Manson Passey, OA 02/06/24 9:49 AM   Karie Chimera, M.D., Ph.D. Diseases & Surgery of the Retina and Vitreous [facility]    Abbreviations: M myopia (nearsighted); A astigmatism; H hyperopia (farsighted); P presbyopia; Mrx spectacle prescription;  CTL contact lenses; OD right eye; OS left eye; OU both eyes  XT exotropia; ET esotropia; PEK punctate epithelial keratitis; PEE punctate epithelial erosions; DES dry eye syndrome; MGD meibomian gland dysfunction; ATs artificial tears; PFAT's preservative free artificial tears; NSC nuclear sclerotic cataract; PSC posterior subcapsular cataract; ERM epi-retinal membrane; PVD posterior vitreous detachment; RD retinal detachment; DM diabetes mellitus; DR diabetic retinopathy; NPDR non-proliferative diabetic retinopathy; PDR proliferative diabetic retinopathy; CSME clinically significant macular edema; DME diabetic macular edema; dbh dot blot hemorrhages; CWS cotton wool spot; POAG  primary open angle glaucoma; C/D cup-to-disc ratio; HVF humphrey visual field; GVF goldmann visual field; OCT optical coherence tomography; IOP intraocular pressure; BRVO Branch retinal vein occlusion; CRVO central retinal vein occlusion; CRAO central retinal artery occlusion; BRAO branch retinal artery occlusion; RT retinal tear; SB scleral buckle; PPV pars plana vitrectomy; VH Vitreous hemorrhage; PRP panretinal laser photocoagulation; IVK intravitreal kenalog; VMT vitreomacular traction; MH Macular hole;  NVD neovascularization of the disc; NVE neovascularization elsewhere; AREDS age related eye disease study; ARMD age related macular degeneration; POAG primary open angle glaucoma; EBMD epithelial/anterior basement membrane dystrophy; ACIOL anterior chamber intraocular lens; IOL intraocular lens; PCIOL posterior chamber intraocular lens; Phaco/IOL phacoemulsification with intraocular lens placement; PRK photorefractive keratectomy; LASIK laser assisted in situ keratomileusis; HTN hypertension; DM diabetes mellitus; COPD chronic obstructive pulmonary disease

## 2024-02-10 ENCOUNTER — Encounter (INDEPENDENT_AMBULATORY_CARE_PROVIDER_SITE_OTHER): Payer: Medicare Other | Admitting: Ophthalmology

## 2024-02-20 ENCOUNTER — Ambulatory Visit (HOSPITAL_COMMUNITY)
Admission: RE | Admit: 2024-02-20 | Discharge: 2024-02-20 | Disposition: A | Discharge status: Home / Self Care | Payer: Medicare Other | Source: Ambulatory Visit | Attending: Vascular Surgery | Admitting: Vascular Surgery

## 2024-02-20 ENCOUNTER — Ambulatory Visit (INDEPENDENT_AMBULATORY_CARE_PROVIDER_SITE_OTHER): Payer: Medicare Other | Admitting: Vascular Surgery

## 2024-02-20 VITALS — BP 119/66 | HR 88 | Temp 97.6°F

## 2024-02-20 DIAGNOSIS — I83812 Varicose veins of left lower extremities with pain: Secondary | ICD-10-CM | POA: Diagnosis present

## 2024-02-20 DIAGNOSIS — I872 Venous insufficiency (chronic) (peripheral): Secondary | ICD-10-CM

## 2024-02-20 NOTE — Progress Notes (Signed)
 Patient ID: ABDINASIR SPADAFORE, male   DOB: August 23, 1951, 73 y.o.   MRN: [id number]  Reason for Consult: Routine Post Op   Referred by [facility]  Subjective:     HPI:  EINO WHITNER is a 73 y.o. 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  He has been wearing compression stockings does not really have any complaints other than stable numbness from preprocedural.  Right lower extremity with only minimal edema.  Past Medical History:  Diagnosis Date   Arthritis    Bronchitis    Bronchitis    Cataract    OU   Concussion    late 1990's  after a fall   GERD (gastroesophageal reflux disease)    History of kidney stones    Hypertension    Hypertensive retinopathy    OU   Osteogenesis imperfecta    Peripheral vascular disease (HCC)    PONV (postoperative nausea and vomiting)    pt has post polio syndrome   Post-polio syndrome    Type 2 diabetes mellitus (HCC) 12/25/2018   Family History  Problem Relation Age of Onset   Hypertension Mother    Diabetes Brother    Past Surgical History:  Procedure Laterality Date   ANKLE FRACTURE SURGERY Bilateral    arm surgery Right    nerve surgery   COLONOSCOPY     ELBOW FRACTURE SURGERY Left    EYE MUSCLE SURGERY Left    LEG SURGERY Left    femur fracture with rod   REVERSE SHOULDER ARTHROPLASTY Right 11/10/2019   Procedure: RIGHT REVERSE SHOULDER ARTHROPLASTY;  Surgeon: Cammy Copa, MD;  Location: [facility] OR;  Service: Orthopedics;  Laterality: Right;   TONSILLECTOMY      Short Social History:  Social History   Tobacco Use   Smoking status: Never   Smokeless tobacco: Never  Substance Use Topics   Alcohol use: Yes    Comment: 1 beer occasionally    Allergies  Allergen Reactions   Augmentin [Amoxicillin-Pot Clavulanate] Nausea And Vomiting   Iodine Hives    Pt had IV contrast 10/16/23 without any issue (per Dr Grace Isaac)   Tape Rash     Paper     Current Outpatient Medications  Medication Sig Dispense Refill   acetaminophen (TYLENOL) 650 MG CR tablet Take 1,300 mg by mouth every 8 (eight) hours as needed for pain.     aspirin 81 MG chewable tablet Chew 1 tablet (81 mg total) by mouth daily. 30 tablet 0   B-D ULTRAFINE III SHORT PEN 31G X 8 MM MISC Inject into the skin.     docusate sodium (COLACE) 100 MG capsule Take 100 mg by mouth 2 (two) times daily.     glipiZIDE (GLUCOTROL XL) 10 MG 24 hr tablet Take 1 tablet (10 mg total) by mouth daily with breakfast.     HYDROcodone-acetaminophen (NORCO) 7.5-325 MG tablet Take 1 tablet by mouth daily as needed for moderate pain.     ketoconazole (NIZORAL) 2 % cream Apply 1 Application topically daily.     LANTUS SOLOSTAR 100 UNIT/ML Solostar Pen Inject 34 Units into the skin at bedtime.     lisinopril (ZESTRIL) 20 MG tablet Take 20 mg by mouth daily.     LORazepam (ATIVAN) 1 MG tablet Take 1 tablet 30 to 60 minutes before leaving house on day of office surgery.  Bring second tablet with you to office on  day of office surgery. 2 tablet 0   methocarbamol (ROBAXIN) 500 MG tablet Take 500 mg by mouth every 8 (eight) hours as needed for muscle spasms.     Multiple Vitamin (MULTIVITAMIN) tablet Take 1 tablet by mouth daily.     Omega-3 Fatty Acids (FISH OIL) 1000 MG CAPS Take by mouth.     ONETOUCH ULTRA test strip 1 each 2 (two) times daily.     simvastatin (ZOCOR) 20 MG tablet Take 20 mg by mouth every evening.     SitaGLIPtin-MetFORMIN HCl [phone] MG TB24 Take 1 tablet by mouth at bedtime.     zinc gluconate 50 MG tablet Take 50 mg by mouth daily.     No current facility-administered medications for this visit.    Review of Systems  Constitutional:  Constitutional negative. HENT: HENT negative.  Eyes: Eyes negative.  Cardiovascular: Positive for leg swelling.  GI: Gastrointestinal negative.  Skin: Skin negative.  Neurological: Positive for numbness.  Hematologic:  Hematologic/lymphatic negative.  Psychiatric: Psychiatric negative.        Objective:  Objective   Vitals:   02/20/24 1026  BP: 119/66  Pulse: 88  Temp: 97.6 F (36.4 C)  TempSrc: Temporal  SpO2: 95%     Physical Exam HENT:     Head: Normocephalic.     Mouth/Throat:     Mouth: Mucous membranes are moist.  Cardiovascular:     Rate and Rhythm: Normal rate.  Abdominal:     General: Abdomen is flat.  Musculoskeletal:     Right lower leg: No edema.     Left lower leg: Edema present.  Skin:    Capillary Refill: Capillary refill takes less than 2 seconds.  Neurological:     Mental Status: He is alert.     Motor: Weakness present.     Data: LEFT     Reflux NoRefluxReflux TimeDiameter cmsComments                     Yes                                   +---------+---------+------+-----------+------------+--------+  CFV                                           patent    +---------+---------+------+-----------+------------+--------+  FV mid                                         patent    +---------+---------+------+-----------+------------+--------+  Popliteal                                     patent    +---------+---------+------+-----------+------------+--------+         Summary:  Left:  - No evidence of deep vein thrombosis from the common femoral through the  popliteal veins.  - Successful ablation of the great saphenous vein from the knee up to  approximately 6.5 cm from the SFJ.      Assessment/Plan:    73 year old male with history of C4 venous disease recurrent cellulitis of the left lower extremity with minimal edema right lower extremity.  He has now undergone left greater saphenous vein ablation is 1 need to prevent recurrent cellulitis.  I recommended continued compression stockings and he can see me on an as-needed basis.      Maeola Harman MD [facility]

## 2024-03-02 ENCOUNTER — Encounter (INDEPENDENT_AMBULATORY_CARE_PROVIDER_SITE_OTHER): Payer: Medicare Other | Admitting: Ophthalmology

## 2024-03-04 NOTE — Progress Notes (Signed)
 [facility] - Clinic Note  03/09/2024     CHIEF COMPLAINT Patient presents for Retina Follow Up  HISTORY OF PRESENT ILLNESS: Kurt [name] is a 73 y.o. male who presents to the clinic today for:  HPI     Retina Follow Up   Patient presents with  CRVO/BRVO.  In right eye.  This started 12 weeks ago.  Duration of 12 weeks.  Since onset it is stable.  I, the attending physician,  performed the HPI with the patient and updated documentation appropriately.        Comments   12 week retina follow up BRVO IVE OD pt is reporting vision is about the same sees Dr Karleen Hampshire April 1st last reading 118 and A1C 7.1      Last edited by Rennis Chris, MD on 03/09/2024  3:03 PM.    Patient states eye have been watering and feel dry  Referring physician:  [facility] [address] [place],  [place] [address]  HISTORICAL INFORMATION:   Selected notes from the MEDICAL RECORD NUMBER Diabetic Eval per Dr. Karleen Hampshire   CURRENT MEDICATIONS: No current outpatient medications on file. (Ophthalmic Drugs)   No current facility-administered medications for this visit. (Ophthalmic Drugs)   Current Outpatient Medications (Other)  Medication Sig   acetaminophen (TYLENOL) 650 MG CR tablet Take 1,300 mg by mouth every 8 (eight) hours as needed for pain.   aspirin 81 MG chewable tablet Chew 1 tablet (81 mg total) by mouth daily.   B-D ULTRAFINE III SHORT PEN 31G X 8 MM MISC Inject into the skin.   docusate sodium (COLACE) 100 MG capsule Take 100 mg by mouth 2 (two) times daily.   glipiZIDE (GLUCOTROL XL) 10 MG 24 hr tablet Take 1 tablet (10 mg total) by mouth daily with breakfast.   HYDROcodone-acetaminophen (NORCO) 7.5-325 MG tablet Take 1 tablet by mouth daily as needed for moderate pain.   ketoconazole (NIZORAL) 2 % cream Apply 1 Application topically daily.   LANTUS SOLOSTAR 100 UNIT/ML Solostar Pen Inject 34 Units into the skin at bedtime.   lisinopril  (ZESTRIL) 20 MG tablet Take 20 mg by mouth daily.   LORazepam (ATIVAN) 1 MG tablet Take 1 tablet 30 to 60 minutes before leaving house on day of office surgery.  Bring second tablet with you to office on day of office surgery.   methocarbamol (ROBAXIN) 500 MG tablet Take 500 mg by mouth every 8 (eight) hours as needed for muscle spasms.   Multiple Vitamin (MULTIVITAMIN) tablet Take 1 tablet by mouth daily.   Omega-3 Fatty Acids (FISH OIL) 1000 MG CAPS Take by mouth.   ONETOUCH ULTRA test strip 1 each 2 (two) times daily.   simvastatin (ZOCOR) 20 MG tablet Take 20 mg by mouth every evening.   SitaGLIPtin-MetFORMIN HCl (939) 420-6356 MG TB24 Take 1 tablet by mouth at bedtime.   zinc gluconate 50 MG tablet Take 50 mg by mouth daily.   No current facility-administered medications for this visit. (Other)   REVIEW OF SYSTEMS: ROS   Positive for: Musculoskeletal, Endocrine, Eyes Negative for: Constitutional, Gastrointestinal, Neurological, Skin, Genitourinary, HENT, Cardiovascular, Respiratory, Psychiatric, Allergic/Imm, Heme/Lymph Last edited by Etheleen Mayhew, COT on 03/09/2024  2:00 PM.      ALLERGIES Allergies  Allergen Reactions   Augmentin [Amoxicillin-Pot Clavulanate] Nausea And Vomiting   Iodine Hives    Pt had IV contrast 10/16/23 without any issue (per Dr Grace Isaac)   Tape Rash  Paper    PAST MEDICAL HISTORY Past Medical History:  Diagnosis Date   Arthritis    Bronchitis    Bronchitis    Cataract    OU   Concussion    late 1990's  after a fall   GERD (gastroesophageal reflux disease)    History of kidney stones    Hypertension    Hypertensive retinopathy    OU   Osteogenesis imperfecta    Peripheral vascular disease (HCC)    PONV (postoperative nausea and vomiting)    pt has post polio syndrome   Post-polio syndrome    Type 2 diabetes mellitus (HCC) 12/25/2018   Past Surgical History:  Procedure Laterality Date   ANKLE FRACTURE SURGERY Bilateral    arm surgery  Right    nerve surgery   COLONOSCOPY     ELBOW FRACTURE SURGERY Left    EYE MUSCLE SURGERY Left    LEG SURGERY Left    femur fracture with rod   REVERSE SHOULDER ARTHROPLASTY Right 11/10/2019   Procedure: RIGHT REVERSE SHOULDER ARTHROPLASTY;  Surgeon: Cammy Copa, MD;  Location: [facility] OR;  Service: Orthopedics;  Laterality: Right;   TONSILLECTOMY     FAMILY HISTORY Family History  Problem Relation Age of Onset   Hypertension Mother    Diabetes Brother    SOCIAL HISTORY Social History   Tobacco Use   Smoking status: Never   Smokeless tobacco: Never  Vaping Use   Vaping status: Never Used  Substance Use Topics   Alcohol use: Yes    Comment: 1 beer occasionally   Drug use: No       OPHTHALMIC EXAM: Base Eye Exam     Visual Acuity (Snellen - Linear)       Right Left   Dist cc 20/25 -2 20/20 -3   Dist ph cc 20/20 -3 20/20 -1         Tonometry (Tonopen, 2:09 PM)       Right Left   Pressure 13 14         Pupils       Pupils Dark Light Shape React APD   Right PERRL 3 2 Round Brisk None   Left PERRL 3 2 Round Brisk None         Visual Fields       Left Right    Full Full         Extraocular Movement       Right Left    Full, Ortho Full, Ortho         Neuro/Psych     Oriented x3: Yes   Mood/Affect: Normal         Dilation     Both eyes: 2.5% Phenylephrine @ 2:10 PM           Slit Lamp and Fundus Exam     Slit Lamp Exam       Right Left   Lids/Lashes Dermatochalasis - upper lid, mild Meibomian gland dysfunction Dermatochalasis - upper lid, Dermatochalasis - lower lid, mild Meibomian gland dysfunction   Conjunctiva/Sclera blue sclera blue sclera   Cornea arcus, well healed cataract wound, trace Punctate epithelial erosions, Debris in tear film arcus, well healed cataract wound, trace Debris in tear film   Anterior Chamber deep and clear deep and clear   Iris round and dilated, no NVI round and dilated, no NVI   Lens PC  IOL in good position PC IOL in good position   Anterior Vitreous mild  syneresis, PVD, vitreous condensations mild syneresis         Fundus Exam       Right Left   Disc pink and sharp mild Pallor, Sharp rim   C/D Ratio 0.5 0.4   Macula Good foveal reflex; trace persistent edema SN macula -- slightly increased, mild focal laser changes, no heme -- no great targets for focal laser flat, good foveal reflex, RPE mottling and clumping, fine drusen, No heme or edema   Vessels attenuated, Tortuous attenuated, Tortuous   Periphery attached, no heme attached, no heme           Refraction     Wearing Rx       Sphere Cylinder Axis Add   Right -2.50 +2.00 105 +2.75   Left -1.50 +1.75 101 +2.75           IMAGING AND PROCEDURES  Imaging and Procedures for  OCT, Retina - OU - Both Eyes       Right Eye Quality was good. Central Foveal Thickness: 240. Progression has worsened. Findings include normal foveal contour, no SRF, retinal drusen , intraretinal fluid, outer retinal atrophy (Mild interval increase in IRF/cystic changes SN macula ).   Left Eye Quality was good. Central Foveal Thickness: 250. Progression has been stable. Findings include normal foveal contour, no IRF, no SRF, retinal drusen , vitreomacular adhesion (Focal PED/drusen temporal fovea, no fluid).   Notes *Images captured and stored on drive  Diagnosis / Impression:  OD: BRVO with mild interval increase in IRF/cystic changes SN macula  OS: NFP, no SRF/IRF, Focal PED/drusen temporal fovea, no fluid  Clinical management:  See below  Abbreviations: NFP - Normal foveal profile. CME - cystoid macular edema. PED - pigment epithelial detachment. IRF - intraretinal fluid. SRF - subretinal fluid. EZ - ellipsoid zone. ERM - epiretinal membrane. ORA - outer retinal atrophy. ORT - outer retinal tubulation. SRHM - subretinal hyper-reflective material       Intravitreal Injection, Pharmacologic Agent - OD - Right Eye        Time Out 03/09/2024. 2:49 PM. Confirmed correct patient, procedure, site, and patient consented.   Anesthesia Topical anesthesia was used. Anesthetic medications included Lidocaine 2%, Proparacaine 0.5%.   Procedure Preparation included 5% betadine to ocular surface, eyelid speculum. A supplied (32 g) needle was used.   Injection: 1.25 mg Bevacizumab 1.25mg /0.74ml   Route: Intravitreal, Site: Right Eye   NDC: [id number], Lot: [id number], Expiration date: 04/18/2024   Post-op Post injection exam found visual acuity of at least counting fingers. The patient tolerated the procedure well. There were no complications. The patient received written and verbal post procedure care education. Post injection medications were not given.            ASSESSMENT/PLAN:   ICD-10-CM   1. Branch retinal vein occlusion of right eye with macular edema  H34.8310 OCT, Retina - OU - Both Eyes    Intravitreal Injection, Pharmacologic Agent - OD - Right Eye    Bevacizumab (AVASTIN) SOLN 1.25 mg    2. 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 Current use of insulin (HCC)  Z79.4     4. Long term (current) use of oral hypoglycemic drugs  Z79.84     5. Essential hypertension  I10     6. Hypertensive retinopathy of both eyes  H35.033     7. Pseudophakia of both eyes  Z96.1      1. BRVO with CME  OD  - delayed f/u from 9 weeks to 12 weeks (12.30.24 to 03.24.25) - s/p IVA OD #1 (01.19.21), #2 (02.16.21), #3 (03.16.21), #4 (4.13.21) -- IVA resistance - s/p IVE OD #1 (05.11.21--sample), #2 (06.08.21), #3 (07.06.21), #4 (8.3.21), #5 (08.31.21), #6 (09.28.21), #7 (10.26.21), #8 (11.23.21), #9 (12.22.21), #10 (1.24.22), #11 (2.22.22), #12 (3.22.22), #13 (04.19.22), #14 (05.25.22), #15 (06.29.22), #16 (07.27.22), #17 (08.24.22), #18 (09.21.22), #19 (10.19.22), #20 (11.22.22), #21 (12.20.22), #22 (01.18.23), #23 (02.22.23), #24 (03.28.23), #25 (05.09.23), #26 (06.20.23), #27 (08.07.23),  #28 (10.02.23), #29 (11.27.23), #30 (01.22.24), #31 (03.18.24), #32 (05.13.24), #33 (07.08.24), #34 (09.03.24), #35 (10.28.24), #36 (12.30.24)  - s/p focal laser OD (07.13.22)  - BCVA OD 20/20 (s/p CEIOL) -- improved  - exam with focal edema, IRH, CWS superonasal macula -- improved - OCT shows BRVO with Mild interval increase in IRF/cystic changes SN macula at 12 weeks  - recommend switching back to IVA OD #37 today (03.24.25) -- due to no funding with [employer] w/ f/u back to 8 wks  - RBA of procedure discussed, questions answered  - Avastin informed consent form re-signed and scanned on 03.24.25  - see procedure note - [employer] benefits investigation started, 05.11.21 -- no funding for [employer] - F/U 8 wks -- DFE/OCT/possible injection; pt may benefit from repeat focal laser OD, but no great targets  2-4. Diabetes mellitus, type 2 without retinopathy OU - The incidence, risk factors for progression, natural history and treatment options for diabetic retinopathy  were discussed with patient.   - The need for close monitoring of blood glucose, blood pressure, and serum lipids, avoiding cigarette or any type of tobacco, and the need for long term follow up was also discussed with patient.  - monitor  5,6. Hypertensive retinopathy OU  - discussed importance of tight BP control  - monitor  7. Pseudophakia OU  - s/p CE/IOL (Dr. Sherrine Maples, OD: 06.05.24, OS: 06.25.24)  - IOLs in good position, doing well  - monitor  Ophthalmic Meds Ordered this visit:  Meds ordered this encounter  Medications   Bevacizumab (AVASTIN) SOLN 1.25 mg     Return in about 8 weeks (around 05/04/2024) for f/u BRVO OD, DFE, OCT, Possible Injxn.  There are no Patient Instructions on file for this visit.  This document serves as a record of services personally performed by Karie Chimera, MD, PhD. It was created on their behalf by Glee Arvin. Manson Passey, OA an ophthalmic technician. The creation of this record is the  provider's dictation and/or activities during the visit.    Electronically signed by: Glee Arvin. Manson Passey, OA 03/09/24 3:04 PM  Karie Chimera, M.D., Ph.D. Diseases & Surgery of the Retina and Vitreous [facility]  I have reviewed the above documentation for accuracy and completeness, and I agree with the above. Karie Chimera, M.D., Ph.D. 03/09/24 3:08 PM   Abbreviations: M myopia (nearsighted); A astigmatism; H hyperopia (farsighted); P presbyopia; Mrx spectacle prescription;  CTL contact lenses; OD right eye; OS left eye; OU both eyes  XT exotropia; ET esotropia; PEK punctate epithelial keratitis; PEE punctate epithelial erosions; DES dry eye syndrome; MGD meibomian gland dysfunction; ATs artificial tears; PFAT's preservative free artificial tears; NSC nuclear sclerotic cataract; PSC posterior subcapsular cataract; ERM epi-retinal membrane; PVD posterior vitreous detachment; RD retinal detachment; DM diabetes mellitus; DR diabetic retinopathy; NPDR non-proliferative diabetic retinopathy; PDR proliferative diabetic retinopathy; CSME clinically significant macular edema; DME diabetic macular edema; dbh dot blot hemorrhages; CWS cotton wool spot;  POAG primary open angle glaucoma; C/D cup-to-disc ratio; HVF humphrey visual field; GVF goldmann visual field; OCT optical coherence tomography; IOP intraocular pressure; BRVO Branch retinal vein occlusion; CRVO central retinal vein occlusion; CRAO central retinal artery occlusion; BRAO branch retinal artery occlusion; RT retinal tear; SB scleral buckle; PPV pars plana vitrectomy; VH Vitreous hemorrhage; PRP panretinal laser photocoagulation; IVK intravitreal kenalog; VMT vitreomacular traction; MH Macular hole;  NVD neovascularization of the disc; NVE neovascularization elsewhere; AREDS age related eye disease study; ARMD age related macular degeneration; POAG primary open angle glaucoma; EBMD epithelial/anterior basement membrane dystrophy;  ACIOL anterior chamber intraocular lens; IOL intraocular lens; PCIOL posterior chamber intraocular lens; Phaco/IOL phacoemulsification with intraocular lens placement; PRK photorefractive keratectomy; LASIK laser assisted in situ keratomileusis; HTN hypertension; DM diabetes mellitus; COPD chronic obstructive pulmonary disease

## 2024-03-09 ENCOUNTER — Ambulatory Visit (INDEPENDENT_AMBULATORY_CARE_PROVIDER_SITE_OTHER): Payer: Medicare Other | Admitting: Ophthalmology

## 2024-03-09 ENCOUNTER — Encounter (INDEPENDENT_AMBULATORY_CARE_PROVIDER_SITE_OTHER): Payer: Self-pay | Admitting: Ophthalmology

## 2024-03-09 DIAGNOSIS — Z7984 Long term (current) use of oral hypoglycemic drugs: Secondary | ICD-10-CM

## 2024-03-09 DIAGNOSIS — H34831 Tributary (branch) retinal vein occlusion, right eye, with macular edema: Secondary | ICD-10-CM | POA: Diagnosis not present

## 2024-03-09 DIAGNOSIS — I10 Essential (primary) hypertension: Secondary | ICD-10-CM

## 2024-03-09 DIAGNOSIS — E119 Type 2 diabetes mellitus without complications: Secondary | ICD-10-CM

## 2024-03-09 DIAGNOSIS — H35033 Hypertensive retinopathy, bilateral: Secondary | ICD-10-CM

## 2024-03-09 DIAGNOSIS — Z794 Long term (current) use of insulin: Secondary | ICD-10-CM

## 2024-03-09 DIAGNOSIS — Z961 Presence of intraocular lens: Secondary | ICD-10-CM

## 2024-03-09 MED ORDER — BEVACIZUMAB CHEMO INJECTION 1.25MG/0.05ML SYRINGE FOR KALEIDOSCOPE
1.2500 mg | INTRAVITREAL | Status: AC | PRN
  Administered 2024-03-09: 1.25 mg via INTRAVITREAL

## 2024-04-28 NOTE — Progress Notes (Signed)
 [facility] - Clinic Note  05/04/2024     CHIEF COMPLAINT Patient presents for Retina Follow Up  HISTORY OF PRESENT ILLNESS: Kurt [name] is a 73 y.o. male who presents to the clinic today for:  HPI     Retina Follow Up   Patient presents with  CRVO/BRVO.  In right eye.  This started 12 weeks ago.  Duration of 12 weeks.  Since onset it is stable.  I, the attending physician,  performed the HPI with the patient and updated documentation appropriately.        Comments   Patient feels the vision is about the same. He is using AT's. His blood sugar the other day was 102.      Last edited by Ronelle Coffee, MD on 05/04/2024  5:05 PM.    Pt states he is having problems with allergies  Referring physician:  [facility] [address] [place],  [place] [address]  HISTORICAL INFORMATION:   Selected notes from the MEDICAL RECORD NUMBER Diabetic Eval per Dr. Jolena Nay   CURRENT MEDICATIONS: No current outpatient medications on file. (Ophthalmic Drugs)   No current facility-administered medications for this visit. (Ophthalmic Drugs)   Current Outpatient Medications (Other)  Medication Sig   acetaminophen  (TYLENOL ) 650 MG CR tablet Take 1,300 mg by mouth every 8 (eight) hours as needed for pain.   aspirin  81 MG chewable tablet Chew 1 tablet (81 mg total) by mouth daily.   B-D ULTRAFINE III SHORT PEN 31G X 8 MM MISC Inject into the skin.   docusate sodium  (COLACE) 100 MG capsule Take 100 mg by mouth 2 (two) times daily.   glipiZIDE  (GLUCOTROL  XL) 10 MG 24 hr tablet Take 1 tablet (10 mg total) by mouth daily with breakfast.   HYDROcodone -acetaminophen  (NORCO) 7.5-325 MG tablet Take 1 tablet by mouth daily as needed for moderate pain.   ketoconazole (NIZORAL) 2 % cream Apply 1 Application topically daily.   LANTUS  SOLOSTAR 100 UNIT/ML Solostar Pen Inject 34 Units into the skin at bedtime.   lisinopril  (ZESTRIL ) 20 MG tablet Take 20 mg by  mouth daily.   LORazepam  (ATIVAN ) 1 MG tablet Take 1 tablet 30 to 60 minutes before leaving house on day of office surgery.  Bring second tablet with you to office on day of office surgery.   methocarbamol  (ROBAXIN ) 500 MG tablet Take 500 mg by mouth every 8 (eight) hours as needed for muscle spasms.   Multiple Vitamin (MULTIVITAMIN) tablet Take 1 tablet by mouth daily.   Omega-3 Fatty Acids (FISH OIL) 1000 MG CAPS Take by mouth.   ONETOUCH ULTRA test strip 1 each 2 (two) times daily.   simvastatin  (ZOCOR ) 20 MG tablet Take 20 mg by mouth every evening.   SitaGLIPtin -MetFORMIN  HCl [phone] MG TB24 Take 1 tablet by mouth at bedtime.   zinc gluconate 50 MG tablet Take 50 mg by mouth daily.   No current facility-administered medications for this visit. (Other)   REVIEW OF SYSTEMS: ROS   Positive for: Musculoskeletal, Endocrine, Eyes Negative for: Constitutional, Gastrointestinal, Neurological, Skin, Genitourinary, HENT, Cardiovascular, Respiratory, Psychiatric, Allergic/Imm, Heme/Lymph Last edited by Olene Berne, COT on 05/04/2024  2:18 PM.       ALLERGIES Allergies  Allergen Reactions   Augmentin [Amoxicillin-Pot Clavulanate] Nausea And Vomiting   Iodine Hives    Pt had IV contrast 10/16/23 without any issue (per Dr Reva Castleman)   Tape Rash    Paper  PAST MEDICAL HISTORY Past Medical History:  Diagnosis Date   Arthritis    Bronchitis    Bronchitis    Cataract    OU   Concussion    late 1990's  after a fall   GERD (gastroesophageal reflux disease)    History of kidney stones    Hypertension    Hypertensive retinopathy    OU   Osteogenesis imperfecta    Peripheral vascular disease (HCC)    PONV (postoperative nausea and vomiting)    pt has post polio syndrome   Post-polio syndrome    Type 2 diabetes mellitus (HCC) 12/25/2018   Past Surgical History:  Procedure Laterality Date   ANKLE FRACTURE SURGERY Bilateral    arm surgery Right    nerve surgery   COLONOSCOPY      ELBOW FRACTURE SURGERY Left    EYE MUSCLE SURGERY Left    LEG SURGERY Left    femur fracture with rod   REVERSE SHOULDER ARTHROPLASTY Right 11/10/2019   Procedure: RIGHT REVERSE SHOULDER ARTHROPLASTY;  Surgeon: Jasmine Mesi, MD;  Location: [facility] OR;  Service: Orthopedics;  Laterality: Right;   TONSILLECTOMY     FAMILY HISTORY Family History  Problem Relation Age of Onset   Hypertension Mother    Diabetes Brother    SOCIAL HISTORY Social History   Tobacco Use   Smoking status: Never   Smokeless tobacco: Never  Vaping Use   Vaping status: Never Used  Substance Use Topics   Alcohol use: Yes    Comment: 1 beer occasionally   Drug use: No       OPHTHALMIC EXAM: Base Eye Exam     Visual Acuity (Snellen - Linear)       Right Left   Dist cc 20/20 +2 20/20    Correction: Glasses         Tonometry (Tonopen, 2:23 PM)       Right Left   Pressure 16 18         Pupils       Dark Light Shape React APD   Right 3 2 Round Brisk None   Left 3 2 Round Brisk None         Visual Fields       Left Right    Full Full         Extraocular Movement       Right Left    Full, Ortho Full, Ortho         Neuro/Psych     Oriented x3: Yes   Mood/Affect: Normal         Dilation     Both eyes: 1.0% Mydriacyl, 2.5% Phenylephrine  @ 2:19 PM           Slit Lamp and Fundus Exam     Slit Lamp Exam       Right Left   Lids/Lashes Dermatochalasis - upper lid, mild Meibomian gland dysfunction Dermatochalasis - upper lid, Dermatochalasis - lower lid, mild Meibomian gland dysfunction   Conjunctiva/Sclera blue sclera blue sclera   Cornea arcus, well healed cataract wound, trace Punctate epithelial erosions, Debris in tear film arcus, well healed cataract wound, trace Debris in tear film   Anterior Chamber deep and clear deep and clear   Iris round and dilated, no NVI round and dilated, no NVI   Lens PC IOL in good position PC IOL in good position    Anterior Vitreous mild syneresis, PVD, vitreous condensations mild syneresis  Fundus Exam       Right Left   Disc pink and sharp mild Pallor, Sharp rim   C/D Ratio 0.5 0.4   Macula Good foveal reflex; trace persistent edema SN macula, mild focal laser changes, no heme -- no great targets for focal laser flat, good foveal reflex, RPE mottling and clumping, fine drusen, No heme or edema   Vessels attenuated, mild tortuosity attenuated, Tortuous   Periphery attached, no heme attached, no heme           Refraction     Wearing Rx       Sphere Cylinder Axis Add   Right -2.50 +2.00 105 +2.75   Left -1.50 +1.75 101 +2.75           IMAGING AND PROCEDURES  Imaging and Procedures for  OCT, Retina - OU - Both Eyes        Right Eye Quality was good. Central Foveal Thickness: 247. Progression has worsened. Findings include normal foveal contour, no SRF, retinal drusen , intraretinal fluid, outer retinal atrophy (Mild interval increase in IRF/cystic changes SN macula ).   Left Eye Quality was good. Central Foveal Thickness: 253. Progression has been stable. Findings include normal foveal contour, no IRF, no SRF, retinal drusen , vitreomacular adhesion (Focal PED/drusen temporal fovea, no fluid).   Notes  *Images captured and stored on drive  Diagnosis / Impression:  OD: BRVO with mild interval increase in IRF/cystic changes SN macula  OS: NFP, no SRF/IRF, Focal PED/drusen temporal fovea, no fluid  Clinical management:  See below  Abbreviations: NFP - Normal foveal profile. CME - cystoid macular edema. PED - pigment epithelial detachment. IRF - intraretinal fluid. SRF - subretinal fluid. EZ - ellipsoid zone. ERM - epiretinal membrane. ORA - outer retinal atrophy. ORT - outer retinal tubulation. SRHM - subretinal hyper-reflective material       Intravitreal Injection, Pharmacologic Agent - OD - Right Eye       Time Out 05/04/2024. 2:56 PM. Confirmed correct  patient, procedure, site, and patient consented.   Anesthesia Topical anesthesia was used. Anesthetic medications included Lidocaine  2%, Proparacaine 0.5%.   Procedure Preparation included 5% betadine to ocular surface, eyelid speculum. A supplied (32 g) needle was used.   Injection: 1.25 mg Bevacizumab  1.25mg /0.54ml   Route: Intravitreal, Site: Right Eye   NDC: [id number], Lot: [id number], Expiration date: 06/05/2024   Post-op Post injection exam found visual acuity of at least counting fingers. The patient tolerated the procedure well. There were no complications. The patient received written and verbal post procedure care education. Post injection medications were not given.            ASSESSMENT/PLAN:   ICD-10-CM   1. Branch retinal vein occlusion of right eye with macular edema  H34.8310 OCT, Retina - OU - Both Eyes    Intravitreal Injection, Pharmacologic Agent - OD - Right Eye    Bevacizumab  (AVASTIN ) SOLN 1.25 mg    2. 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 Current use of insulin  (HCC)  Z79.4     4. Long term (current) use of oral hypoglycemic drugs  Z79.84     5. Essential hypertension  I10     6. Hypertensive retinopathy of both eyes  H35.033     7. Pseudophakia of both eyes  Z96.1      1. BRVO with CME OD  - delayed f/u from 9 weeks to 12 weeks (12.30.24 to 03.24.25) -  s/p IVA OD #1 (01.19.21), #2 (02.16.21), #3 (03.16.21), #4 (4.13.21), #5 (03.24.25) - s/p IVE OD #1 (05.11.21--sample), #2 (06.08.21), #3 (07.06.21), #4 (8.3.21), #5 (08.31.21), #6 (09.28.21), #7 (10.26.21), #8 (11.23.21), #9 (12.22.21), #10 (1.24.22), #11 (2.22.22), #12 (3.22.22), #13 (04.19.22), #14 (05.25.22), #15 (06.29.22), #16 (07.27.22), #17 (08.24.22), #18 (09.21.22), #19 (10.19.22), #20 (11.22.22), #21 (12.20.22), #22 (01.18.23), #23 (02.22.23), #24 (03.28.23), #25 (05.09.23), #26 (06.20.23), #27 (08.07.23), #28 (10.02.23), #29 (11.27.23), #30 (01.22.24), #31  (03.18.24), #32 (05.13.24), #33 (07.08.24), #34 (09.03.24), #35 (10.28.24), #36 (12.30.24)  - s/p focal laser OD (07.13.22)  - BCVA OD 20/20 - stably improved s/p CEIOL  - exam with focal edema, IRH, CWS superonasal macula -- improved - OCT shows BRVO with Mild interval increase in IRF/cystic changes SN macula at 8 weeks (back on Avastin )  - recommend IVA OD #6 today (05.19.25)  w/ f/u back to 6-8 wks  - RBA of procedure discussed, questions answered  - Avastin  informed consent form re-signed and scanned on 03.24.25  - see procedure note - [employer] benefits investigation started, 05.11.21 -- no funding for Good Days - F/U 6-8 wks -- DFE/OCT/possible injection; pt may benefit from repeat focal laser OD, but no great targets  2-4. Diabetes mellitus, type 2 without retinopathy OU - The incidence, risk factors for progression, natural history and treatment options for diabetic retinopathy  were discussed with patient.   - The need for close monitoring of blood glucose, blood pressure, and serum lipids, avoiding cigarette or any type of tobacco, and the need for long term follow up was also discussed with patient.  - monitor  5,6. Hypertensive retinopathy OU  - discussed importance of tight BP control  - monitor  7. Pseudophakia OU  - s/p CE/IOL (Dr. Allison Ivory, OD: 06.05.24, OS: 06.25.24)  - IOLs in good position, doing well  - monitor  Ophthalmic Meds Ordered this visit:  Meds ordered this encounter  Medications   Bevacizumab  (AVASTIN ) SOLN 1.25 mg     Return for f/u 6-8 weeks, BRVO OD, DFE, OCT, Possible Injxn.  There are no Patient Instructions on file for this visit.  This document serves as a record of services personally performed by Jeanice Millard, MD, PhD. It was created on their behalf by Mayola Specking, COA an ophthalmic technician. The creation of this record is the provider's dictation and/or activities during the visit.   Electronically signed by: Carrington Clack, COT   05/06/24  12:41 AM   This document serves as a record of services personally performed by Jeanice Millard, MD, PhD. It was created on their behalf by Morley Arabia. Bevin Bucks, OA an ophthalmic technician. The creation of this record is the provider's dictation and/or activities during the visit.    Electronically signed by: Morley Arabia. Bevin Bucks, OA 05/06/24 12:41 AM  Jeanice Millard, M.D., Ph.D. Diseases & Surgery of the Retina and Vitreous [facility]  I have reviewed the above documentation for accuracy and completeness, and I agree with the above. Jeanice Millard, M.D., Ph.D. 05/06/24 12:43 AM   Abbreviations: M myopia (nearsighted); A astigmatism; H hyperopia (farsighted); P presbyopia; Mrx spectacle prescription;  CTL contact lenses; OD right eye; OS left eye; OU both eyes  XT exotropia; ET esotropia; PEK punctate epithelial keratitis; PEE punctate epithelial erosions; DES dry eye syndrome; MGD meibomian gland dysfunction; ATs artificial tears; PFAT's preservative free artificial tears; NSC nuclear sclerotic cataract; PSC posterior subcapsular cataract; ERM epi-retinal membrane; PVD posterior vitreous detachment; RD  retinal detachment; DM diabetes mellitus; DR diabetic retinopathy; NPDR non-proliferative diabetic retinopathy; PDR proliferative diabetic retinopathy; CSME clinically significant macular edema; DME diabetic macular edema; dbh dot blot hemorrhages; CWS cotton wool spot; POAG primary open angle glaucoma; C/D cup-to-disc ratio; HVF humphrey visual field; GVF goldmann visual field; OCT optical coherence tomography; IOP intraocular pressure; BRVO Branch retinal vein occlusion; CRVO central retinal vein occlusion; CRAO central retinal artery occlusion; BRAO branch retinal artery occlusion; RT retinal tear; SB scleral buckle; PPV pars plana vitrectomy; VH Vitreous hemorrhage; PRP panretinal laser photocoagulation; IVK intravitreal kenalog; VMT vitreomacular traction; MH Macular hole;   NVD neovascularization of the disc; NVE neovascularization elsewhere; AREDS age related eye disease study; ARMD age related macular degeneration; POAG primary open angle glaucoma; EBMD epithelial/anterior basement membrane dystrophy; ACIOL anterior chamber intraocular lens; IOL intraocular lens; PCIOL posterior chamber intraocular lens; Phaco/IOL phacoemulsification with intraocular lens placement; PRK photorefractive keratectomy; LASIK laser assisted in situ keratomileusis; HTN hypertension; DM diabetes mellitus; COPD chronic obstructive pulmonary disease

## 2024-05-04 ENCOUNTER — Ambulatory Visit (INDEPENDENT_AMBULATORY_CARE_PROVIDER_SITE_OTHER): Admitting: Ophthalmology

## 2024-05-04 ENCOUNTER — Encounter (INDEPENDENT_AMBULATORY_CARE_PROVIDER_SITE_OTHER): Payer: Self-pay | Admitting: Ophthalmology

## 2024-05-04 DIAGNOSIS — E119 Type 2 diabetes mellitus without complications: Secondary | ICD-10-CM

## 2024-05-04 DIAGNOSIS — I10 Essential (primary) hypertension: Secondary | ICD-10-CM

## 2024-05-04 DIAGNOSIS — H35033 Hypertensive retinopathy, bilateral: Secondary | ICD-10-CM

## 2024-05-04 DIAGNOSIS — Z7984 Long term (current) use of oral hypoglycemic drugs: Secondary | ICD-10-CM

## 2024-05-04 DIAGNOSIS — H34831 Tributary (branch) retinal vein occlusion, right eye, with macular edema: Secondary | ICD-10-CM | POA: Diagnosis not present

## 2024-05-04 DIAGNOSIS — Z794 Long term (current) use of insulin: Secondary | ICD-10-CM | POA: Diagnosis not present

## 2024-05-04 DIAGNOSIS — Z961 Presence of intraocular lens: Secondary | ICD-10-CM

## 2024-05-04 MED ORDER — BEVACIZUMAB CHEMO INJECTION 1.25MG/0.05ML SYRINGE FOR KALEIDOSCOPE
1.2500 mg | INTRAVITREAL | Status: AC | PRN
  Administered 2024-05-04: 1.25 mg via INTRAVITREAL

## 2024-06-16 NOTE — Progress Notes (Signed)
 [facility] - Clinic Note  06/22/2024     CHIEF COMPLAINT Patient presents for Retina Follow Up  HISTORY OF PRESENT ILLNESS: Kurt [name] is a 73 y.o. male who presents to the clinic today for:  HPI     Retina Follow Up   Patient presents with  CRVO/BRVO.  In right eye.  This started 4.5 years ago.  Duration of 7 weeks.  Since onset it is stable.  I, the attending physician,  performed the HPI with the patient and updated documentation appropriately.        Comments   Patient feels the vision is about the same. Pt states he is expecting his new glasses this week. He is using Systane PRN, pts eyes water  excessively in our office.      Last edited by Valdemar Rogue, MD on 06/22/2024  4:23 PM.    Pt states his vision is stable, he has new glasses on order  Referring physician:  [facility] [address] [place],  [place] [address]  HISTORICAL INFORMATION:   Selected notes from the MEDICAL RECORD NUMBER Diabetic Eval per Dr. Jacques   CURRENT MEDICATIONS: No current outpatient medications on file. (Ophthalmic Drugs)   No current facility-administered medications for this visit. (Ophthalmic Drugs)   Current Outpatient Medications (Other)  Medication Sig   acetaminophen  (TYLENOL ) 650 MG CR tablet Take 1,300 mg by mouth every 8 (eight) hours as needed for pain.   aspirin  81 MG chewable tablet Chew 1 tablet (81 mg total) by mouth daily.   B-D ULTRAFINE III SHORT PEN 31G X 8 MM MISC Inject into the skin.   docusate sodium  (COLACE) 100 MG capsule Take 100 mg by mouth 2 (two) times daily.   glipiZIDE  (GLUCOTROL  XL) 10 MG 24 hr tablet Take 1 tablet (10 mg total) by mouth daily with breakfast.   HYDROcodone -acetaminophen  (NORCO) 7.5-325 MG tablet Take 1 tablet by mouth daily as needed for moderate pain.   ketoconazole (NIZORAL) 2 % cream Apply 1 Application topically daily.   LANTUS  SOLOSTAR 100 UNIT/ML Solostar Pen Inject 34 Units into  the skin at bedtime.   lisinopril  (ZESTRIL ) 20 MG tablet Take 20 mg by mouth daily.   LORazepam  (ATIVAN ) 1 MG tablet Take 1 tablet 30 to 60 minutes before leaving house on day of office surgery.  Bring second tablet with you to office on day of office surgery.   methocarbamol  (ROBAXIN ) 500 MG tablet Take 500 mg by mouth every 8 (eight) hours as needed for muscle spasms.   Multiple Vitamin (MULTIVITAMIN) tablet Take 1 tablet by mouth daily.   Omega-3 Fatty Acids (FISH OIL) 1000 MG CAPS Take by mouth.   ONETOUCH ULTRA test strip 1 each 2 (two) times daily.   simvastatin  (ZOCOR ) 20 MG tablet Take 20 mg by mouth every evening.   SitaGLIPtin -MetFORMIN  HCl [phone] MG TB24 Take 1 tablet by mouth at bedtime.   zinc gluconate 50 MG tablet Take 50 mg by mouth daily.   No current facility-administered medications for this visit. (Other)   REVIEW OF SYSTEMS: ROS   Positive for: Musculoskeletal, Endocrine, Eyes Negative for: Constitutional, Gastrointestinal, Neurological, Skin, Genitourinary, HENT, Cardiovascular, Respiratory, Psychiatric, Allergic/Imm, Heme/Lymph Last edited by Elnor Avelina RAMAN, COT on 06/22/2024  2:30 PM.        ALLERGIES Allergies  Allergen Reactions   Augmentin [Amoxicillin-Pot Clavulanate] Nausea And Vomiting   Iodine Hives    Pt had IV contrast 10/16/23 without any  issue (per Dr Adele)   Tape Rash    Paper    PAST MEDICAL HISTORY Past Medical History:  Diagnosis Date   Arthritis    Bronchitis    Bronchitis    Cataract    OU   Concussion    late 1990's  after a fall   GERD (gastroesophageal reflux disease)    History of kidney stones    Hypertension    Hypertensive retinopathy    OU   Osteogenesis imperfecta    Peripheral vascular disease (HCC)    PONV (postoperative nausea and vomiting)    pt has post polio syndrome   Post-polio syndrome    Type 2 diabetes mellitus (HCC) 12/25/2018   Past Surgical History:  Procedure Laterality Date   ANKLE FRACTURE  SURGERY Bilateral    arm surgery Right    nerve surgery   COLONOSCOPY     ELBOW FRACTURE SURGERY Left    EYE MUSCLE SURGERY Left    LEG SURGERY Left    femur fracture with rod   REVERSE SHOULDER ARTHROPLASTY Right 11/10/2019   Procedure: RIGHT REVERSE SHOULDER ARTHROPLASTY;  Surgeon: Addie Cordella Hamilton, MD;  Location: [facility] OR;  Service: Orthopedics;  Laterality: Right;   TONSILLECTOMY     FAMILY HISTORY Family History  Problem Relation Age of Onset   Hypertension Mother    Diabetes Brother    SOCIAL HISTORY Social History   Tobacco Use   Smoking status: Never   Smokeless tobacco: Never  Vaping Use   Vaping status: Never Used  Substance Use Topics   Alcohol use: Yes    Comment: 1 beer occasionally   Drug use: No       OPHTHALMIC EXAM: Base Eye Exam     Visual Acuity (Snellen - Linear)       Right Left   Dist cc 20/40 -1 20/25 -2   Dist ph cc 20/20 -2 20/25 +2    Correction: Glasses         Tonometry (Tonopen, 2:38 PM)       Right Left   Pressure 16 12         Pupils       Pupils Dark Light Shape React APD   Right PERRL 3 2 Round Brisk None   Left PERRL 3 2 Round Brisk None         Visual Fields       Left Right    Full Full         Extraocular Movement       Right Left    Full, Ortho Full, Ortho         Neuro/Psych     Oriented x3: Yes   Mood/Affect: Normal         Dilation     Both eyes: 1.0% Mydriacyl, 2.5% Phenylephrine  @ 2:34 PM           Slit Lamp and Fundus Exam     Slit Lamp Exam       Right Left   Lids/Lashes Dermatochalasis - upper lid, mild Meibomian gland dysfunction Dermatochalasis - upper lid, Dermatochalasis - lower lid, mild Meibomian gland dysfunction   Conjunctiva/Sclera blue sclera blue sclera   Cornea arcus, well healed cataract wound, trace Punctate epithelial erosions, Debris in tear film arcus, well healed cataract wound, trace Debris in tear film   Anterior Chamber deep and clear deep and  clear   Iris round and dilated, no NVI round and dilated, no NVI  Lens PC IOL in good position PC IOL in good position   Anterior Vitreous mild syneresis, PVD, vitreous condensations mild syneresis         Fundus Exam       Right Left   Disc pink and sharp mild Pallor, Sharp rim   C/D Ratio 0.5 0.4   Macula Good foveal reflex; trace persistent edema SN macula -- improving, mild focal laser changes, no heme -- no great targets for focal laser flat, good foveal reflex, RPE mottling and clumping, fine drusen, No heme or edema   Vessels attenuated, mild tortuosity attenuated, Tortuous   Periphery attached, no heme attached, no heme           Refraction     Wearing Rx       Sphere Cylinder Axis Add   Right -2.50 +2.00 105 +2.75   Left -1.50 +1.75 101 +2.75           IMAGING AND PROCEDURES  Imaging and Procedures for  OCT, Retina - OU - Both Eyes       Right Eye Quality was good. Central Foveal Thickness: 241. Progression has improved. Findings include normal foveal contour, no SRF, retinal drusen , intraretinal fluid, outer retinal atrophy (Mild interval improvement in IRF/cystic changes SN macula ).   Left Eye Quality was good. Central Foveal Thickness: 246. Progression has been stable. Findings include normal foveal contour, no IRF, no SRF, retinal drusen , vitreomacular adhesion (Focal PED/drusen temporal fovea, no fluid).   Notes *Images captured and stored on drive  Diagnosis / Impression:  OD: BRVO with mild interval improvement in IRF/cystic changes SN macula  OS: NFP, no SRF/IRF, Focal PED/drusen temporal fovea, no fluid  Clinical management:  See below  Abbreviations: NFP - Normal foveal profile. CME - cystoid macular edema. PED - pigment epithelial detachment. IRF - intraretinal fluid. SRF - subretinal fluid. EZ - ellipsoid zone. ERM - epiretinal membrane. ORA - outer retinal atrophy. ORT - outer retinal tubulation. SRHM - subretinal hyper-reflective  material       Intravitreal Injection, Pharmacologic Agent - OD - Right Eye       Time Out 06/22/2024. 3:44 PM. Confirmed correct patient, procedure, site, and patient consented.   Anesthesia Topical anesthesia was used. Anesthetic medications included Lidocaine  2%, Proparacaine 0.5%.   Procedure Preparation included 5% betadine to ocular surface, eyelid speculum. A supplied (32 g) needle was used.   Injection: 1.25 mg Bevacizumab  1.25mg /0.15ml   Route: Intravitreal, Site: Right Eye   NDC: [id number], Lot: [id number], Expiration date: 07/13/2024   Post-op Post injection exam found visual acuity of at least counting fingers. The patient tolerated the procedure well. There were no complications. The patient received written and verbal post procedure care education. Post injection medications were not given.            ASSESSMENT/PLAN:   ICD-10-CM   1. Branch retinal vein occlusion of right eye with macular edema  H34.8310 OCT, Retina - OU - Both Eyes    Intravitreal Injection, Pharmacologic Agent - OD - Right Eye    Bevacizumab  (AVASTIN ) SOLN 1.25 mg    2. 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 Current use of insulin  (HCC)  Z79.4     4. Long term (current) use of oral hypoglycemic drugs  Z79.84     5. Essential hypertension  I10     6. Hypertensive retinopathy of both eyes  H35.033  7. Pseudophakia of both eyes  Z96.1      1. BRVO with CME OD  - delayed f/u from 9 weeks to 12 weeks (12.30.24 to 03.24.25) - s/p IVA OD #1 (01.19.21), #2 (02.16.21), #3 (03.16.21), #4 (4.13.21), #5 (03.24.25), #6 (05.19.25)  - s/p IVE OD #1 (05.11.21--sample), #2 (06.08.21), #3 (07.06.21), #4 (8.3.21), #5 (08.31.21), #6 (09.28.21), #7 (10.26.21), #8 (11.23.21), #9 (12.22.21), #10 (1.24.22), #11 (2.22.22), #12 (3.22.22), #13 (04.19.22), #14 (05.25.22), #15 (06.29.22), #16 (07.27.22), #17 (08.24.22), #18 (09.21.22), #19 (10.19.22), #20 (11.22.22), #21 (12.20.22), #22  (01.18.23), #23 (02.22.23), #24 (03.28.23), #25 (05.09.23), #26 (06.20.23), #27 (08.07.23), #28 (10.02.23), #29 (11.27.23), #30 (01.22.24), #31 (03.18.24), #32 (05.13.24), #33 (07.08.24), #34 (09.03.24), #35 (10.28.24), #36 (12.30.24)  - s/p focal laser OD (07.13.22)  - BCVA OD 20/20 - stably improved s/p CEIOL  - exam with focal edema, IRH, CWS superonasal macula -- improved - OCT shows BRVO with Mild interval improvement in IRF/cystic changes SN macula at 7 weeks  - recommend IVA OD #7 today 07.07.25 w/ f/u at 7 wks again  - RBA of procedure discussed, questions answered  - Avastin  informed consent form re-signed and scanned on 03.24.25  - see procedure note - [employer] benefits investigation started, 05.11.21 -- no funding for Good Days - F/U 7 wks -- DFE/OCT/possible injection; pt may benefit from repeat focal laser OD, but no great targets  2-4. Diabetes mellitus, type 2 without retinopathy OU - The incidence, risk factors for progression, natural history and treatment options for diabetic retinopathy  were discussed with patient.   - The need for close monitoring of blood glucose, blood pressure, and serum lipids, avoiding cigarette or any type of tobacco, and the need for long term follow up was also discussed with patient.  - monitor  5,6. Hypertensive retinopathy OU  - discussed importance of tight BP control  - monitor  7. Pseudophakia OU  - s/p CE/IOL (Dr. Marcey, OD: 06.05.24, OS: 06.25.24)  - IOLs in good position, doing well  - monitor  Ophthalmic Meds Ordered this visit:  Meds ordered this encounter  Medications   Bevacizumab  (AVASTIN ) SOLN 1.25 mg     Return in about 7 weeks (around 08/10/2024) for f/u BRVO OD, DFE, OCT, Possible Injxn.  There are no Patient Instructions on file for this visit.  This document serves as a record of services personally performed by Redell JUDITHANN Hans, MD, PhD. It was created on their behalf by Avelina Pereyra, COA an ophthalmic technician.  The creation of this record is the provider's dictation and/or activities during the visit.   Electronically signed by: Avelina GORMAN Pereyra, COT  06/22/24  4:23 PM   This document serves as a record of services personally performed by Redell JUDITHANN Hans, MD, PhD. It was created on their behalf by Alan PARAS. Delores, OA an ophthalmic technician. The creation of this record is the provider's dictation and/or activities during the visit.    Electronically signed by: Alan PARAS. Delores, OA 06/22/24 4:23 PM  Redell JUDITHANN Hans, M.D., Ph.D. Diseases & Surgery of the Retina and Vitreous [facility]  I have reviewed the above documentation for accuracy and completeness, and I agree with the above. Redell JUDITHANN Hans, M.D., Ph.D. 06/22/24 4:24 PM   Abbreviations: M myopia (nearsighted); A astigmatism; H hyperopia (farsighted); P presbyopia; Mrx spectacle prescription;  CTL contact lenses; OD right eye; OS left eye; OU both eyes  XT exotropia; ET esotropia; PEK punctate epithelial keratitis; PEE punctate  epithelial erosions; DES dry eye syndrome; MGD meibomian gland dysfunction; ATs artificial tears; PFAT's preservative free artificial tears; NSC nuclear sclerotic cataract; PSC posterior subcapsular cataract; ERM epi-retinal membrane; PVD posterior vitreous detachment; RD retinal detachment; DM diabetes mellitus; DR diabetic retinopathy; NPDR non-proliferative diabetic retinopathy; PDR proliferative diabetic retinopathy; CSME clinically significant macular edema; DME diabetic macular edema; dbh dot blot hemorrhages; CWS cotton wool spot; POAG primary open angle glaucoma; C/D cup-to-disc ratio; HVF humphrey visual field; GVF goldmann visual field; OCT optical coherence tomography; IOP intraocular pressure; BRVO Branch retinal vein occlusion; CRVO central retinal vein occlusion; CRAO central retinal artery occlusion; BRAO branch retinal artery occlusion; RT retinal tear; SB scleral buckle; PPV pars plana  vitrectomy; VH Vitreous hemorrhage; PRP panretinal laser photocoagulation; IVK intravitreal kenalog; VMT vitreomacular traction; MH Macular hole;  NVD neovascularization of the disc; NVE neovascularization elsewhere; AREDS age related eye disease study; ARMD age related macular degeneration; POAG primary open angle glaucoma; EBMD epithelial/anterior basement membrane dystrophy; ACIOL anterior chamber intraocular lens; IOL intraocular lens; PCIOL posterior chamber intraocular lens; Phaco/IOL phacoemulsification with intraocular lens placement; PRK photorefractive keratectomy; LASIK laser assisted in situ keratomileusis; HTN hypertension; DM diabetes mellitus; COPD chronic obstructive pulmonary disease

## 2024-06-22 ENCOUNTER — Ambulatory Visit (INDEPENDENT_AMBULATORY_CARE_PROVIDER_SITE_OTHER): Admitting: Ophthalmology

## 2024-06-22 ENCOUNTER — Encounter (INDEPENDENT_AMBULATORY_CARE_PROVIDER_SITE_OTHER): Payer: Self-pay | Admitting: Ophthalmology

## 2024-06-22 DIAGNOSIS — Z794 Long term (current) use of insulin: Secondary | ICD-10-CM

## 2024-06-22 DIAGNOSIS — Z7984 Long term (current) use of oral hypoglycemic drugs: Secondary | ICD-10-CM | POA: Diagnosis not present

## 2024-06-22 DIAGNOSIS — Z961 Presence of intraocular lens: Secondary | ICD-10-CM

## 2024-06-22 DIAGNOSIS — H35033 Hypertensive retinopathy, bilateral: Secondary | ICD-10-CM

## 2024-06-22 DIAGNOSIS — E119 Type 2 diabetes mellitus without complications: Secondary | ICD-10-CM

## 2024-06-22 DIAGNOSIS — H34831 Tributary (branch) retinal vein occlusion, right eye, with macular edema: Secondary | ICD-10-CM | POA: Diagnosis not present

## 2024-06-22 DIAGNOSIS — I10 Essential (primary) hypertension: Secondary | ICD-10-CM

## 2024-06-22 MED ORDER — BEVACIZUMAB CHEMO INJECTION 1.25MG/0.05ML SYRINGE FOR KALEIDOSCOPE
1.2500 mg | INTRAVITREAL | Status: AC | PRN
  Administered 2024-06-22: 1.25 mg via INTRAVITREAL

## 2024-07-27 NOTE — Progress Notes (Signed)
 [facility] - Clinic Note  08/10/2024     CHIEF COMPLAINT Patient presents for Retina Follow Up  HISTORY OF PRESENT ILLNESS: Kurt [name] is a 73 y.o. male who presents to the clinic today for:  HPI     Retina Follow Up   Patient presents with  CRVO/BRVO.  In right eye.  This started 4.5 years ago.  Duration of 7 weeks.  Since onset it is stable.  I, the attending physician,  performed the HPI with the patient and updated documentation appropriately.        Comments   Pt states since his last visit he continues to notice a bubble in his right eye, it is a black circle with white in the middle. Pt states his new glasses make it more difficult to read, he plans on going to have them checked. Pt has been using Pataday every day to help with watering. Pt is also using Systane PRN.      Last edited by Valdemar Rogue, MD on 08/10/2024  3:24 PM.    Pt states he's having a bit of issue w/ new rx specs. Feels bifocals are too small. Seeing some floaters in OS no FOL, feels he saw better today on the chart.   Referring physician:  [facility] [address] [place],  [place] [address]  HISTORICAL INFORMATION:   Selected notes from the MEDICAL RECORD NUMBER Diabetic Eval per Dr. Jacques   CURRENT MEDICATIONS: No current outpatient medications on file. (Ophthalmic Drugs)   No current facility-administered medications for this visit. (Ophthalmic Drugs)   Current Outpatient Medications (Other)  Medication Sig   acetaminophen  (TYLENOL ) 650 MG CR tablet Take 1,300 mg by mouth every 8 (eight) hours as needed for pain.   aspirin  81 MG chewable tablet Chew 1 tablet (81 mg total) by mouth daily.   B-D ULTRAFINE III SHORT PEN 31G X 8 MM MISC Inject into the skin.   docusate sodium  (COLACE) 100 MG capsule Take 100 mg by mouth 2 (two) times daily.   glipiZIDE  (GLUCOTROL  XL) 10 MG 24 hr tablet Take 1 tablet (10 mg total) by mouth daily with breakfast.    HYDROcodone -acetaminophen  (NORCO) 7.5-325 MG tablet Take 1 tablet by mouth daily as needed for moderate pain.   ketoconazole (NIZORAL) 2 % cream Apply 1 Application topically daily.   LANTUS  SOLOSTAR 100 UNIT/ML Solostar Pen Inject 34 Units into the skin at bedtime.   lisinopril  (ZESTRIL ) 20 MG tablet Take 20 mg by mouth daily.   LORazepam  (ATIVAN ) 1 MG tablet Take 1 tablet 30 to 60 minutes before leaving house on day of office surgery.  Bring second tablet with you to office on day of office surgery.   methocarbamol  (ROBAXIN ) 500 MG tablet Take 500 mg by mouth every 8 (eight) hours as needed for muscle spasms.   Multiple Vitamin (MULTIVITAMIN) tablet Take 1 tablet by mouth daily.   Omega-3 Fatty Acids (FISH OIL) 1000 MG CAPS Take by mouth.   ONETOUCH ULTRA test strip 1 each 2 (two) times daily.   simvastatin  (ZOCOR ) 20 MG tablet Take 20 mg by mouth every evening.   SitaGLIPtin -MetFORMIN  HCl [phone] MG TB24 Take 1 tablet by mouth at bedtime.   zinc gluconate 50 MG tablet Take 50 mg by mouth daily.   No current facility-administered medications for this visit. (Other)   REVIEW OF SYSTEMS: ROS   Positive for: Musculoskeletal, Endocrine, Eyes Negative for: Constitutional, Gastrointestinal, Neurological, Skin,  Genitourinary, HENT, Cardiovascular, Respiratory, Psychiatric, Allergic/Imm, Heme/Lymph Last edited by Elnor Avelina RAMAN, COT on 08/10/2024  1:53 PM.     ALLERGIES Allergies  Allergen Reactions   Augmentin [Amoxicillin-Pot Clavulanate] Nausea And Vomiting   Iodine Hives    Pt had IV contrast 10/16/23 without any issue (per Dr Adele)   Tape Rash    Paper    PAST MEDICAL HISTORY Past Medical History:  Diagnosis Date   Arthritis    Bronchitis    Bronchitis    Cataract    OU   Concussion    late 1990's  after a fall   GERD (gastroesophageal reflux disease)    History of kidney stones    Hypertension    Hypertensive retinopathy    OU   Osteogenesis imperfecta     Peripheral vascular disease (HCC)    PONV (postoperative nausea and vomiting)    pt has post polio syndrome   Post-polio syndrome    Type 2 diabetes mellitus (HCC) 12/25/2018   Past Surgical History:  Procedure Laterality Date   ANKLE FRACTURE SURGERY Bilateral    arm surgery Right    nerve surgery   COLONOSCOPY     ELBOW FRACTURE SURGERY Left    EYE MUSCLE SURGERY Left    LEG SURGERY Left    femur fracture with rod   REVERSE SHOULDER ARTHROPLASTY Right 11/10/2019   Procedure: RIGHT REVERSE SHOULDER ARTHROPLASTY;  Surgeon: Addie Cordella Hamilton, MD;  Location: [facility] OR;  Service: Orthopedics;  Laterality: Right;   TONSILLECTOMY     FAMILY HISTORY Family History  Problem Relation Age of Onset   Hypertension Mother    Diabetes Brother    SOCIAL HISTORY Social History   Tobacco Use   Smoking status: Never   Smokeless tobacco: Never  Vaping Use   Vaping status: Never Used  Substance Use Topics   Alcohol use: Yes    Comment: 1 beer occasionally   Drug use: No       OPHTHALMIC EXAM: Base Eye Exam     Visual Acuity (Snellen - Linear)       Right Left   Dist cc 20/30 20/25 -2   Dist ph cc 20/20 -2 20/25 +2    Correction: Glasses         Tonometry (Tonopen, 2:02 PM)       Right Left   Pressure 13 14         Pupils       Pupils Dark Light Shape React APD   Right PERRL 3 2 Round Minimal None   Left PERRL 3 2 Round Minimal None         Visual Fields       Left Right    Full Full         Extraocular Movement       Right Left    Full, Ortho Full, Ortho         Neuro/Psych     Oriented x3: Yes   Mood/Affect: Normal         Dilation     Both eyes: 1.0% Mydriacyl, 2.5% Phenylephrine  @ 2:03 PM           Slit Lamp and Fundus Exam     Slit Lamp Exam       Right Left   Lids/Lashes Dermatochalasis - upper lid, mild Meibomian gland dysfunction Dermatochalasis - upper lid, Dermatochalasis - lower lid, mild Meibomian gland dysfunction    Conjunctiva/Sclera blue sclera blue sclera  Cornea arcus, well healed cataract wound, trace Punctate epithelial erosions, Debris in tear film arcus, well healed cataract wound, trace Debris in tear film   Anterior Chamber deep and clear deep and clear   Iris round and dilated, no NVI round and dilated, no NVI   Lens PC IOL in good position PC IOL in good position   Anterior Vitreous mild syneresis, PVD, vitreous condensations mild syneresis, PVD         Fundus Exam       Right Left   Disc pink and sharp Pink and Sharp   C/D Ratio 0.5 0.3   Macula Good foveal reflex; trace persistent edema SN macula, mild focal laser changes, no heme -- no great targets for focal laser flat, good foveal reflex, RPE mottling and clumping, fine drusen, No heme or edema   Vessels attenuated, mild tortuosity attenuated, Tortuous   Periphery attached, no heme attached, no heme           Refraction     Wearing Rx       Sphere Cylinder Axis Add   Right -1.25 +1.75 113 +2.50   Left -1.50 +1.75 109 +2.50    Age: >7mo   Type: Bifocal           IMAGING AND PROCEDURES  Imaging and Procedures for  OCT, Retina - OU - Both Eyes       Right Eye Quality was good. Central Foveal Thickness: 240. Progression has improved. Findings include normal foveal contour, no SRF, retinal drusen , intraretinal fluid, outer retinal atrophy (Peristent IRF/cystic changes SN macula ).   Left Eye Quality was good. Central Foveal Thickness: 246. Progression has been stable. Findings include normal foveal contour, no IRF, no SRF, retinal drusen , vitreomacular adhesion (Focal PED/drusen temporal fovea, no fluid; interval release of partial PVD).   Notes *Images captured and stored on drive  Diagnosis / Impression:  OD: BRVO with persistent IRF/cystic changes SN macula   OS: NFP, no SRF/IRF, Focal PED/drusen temporal fovea, no fluid, interval release of partial PVD  Clinical management:  See  below  Abbreviations: NFP - Normal foveal profile. CME - cystoid macular edema. PED - pigment epithelial detachment. IRF - intraretinal fluid. SRF - subretinal fluid. EZ - ellipsoid zone. ERM - epiretinal membrane. ORA - outer retinal atrophy. ORT - outer retinal tubulation. SRHM - subretinal hyper-reflective material       Intravitreal Injection, Pharmacologic Agent - OD - Right Eye       Time Out 08/10/2024. 2:45 PM. Confirmed correct patient, procedure, site, and patient consented.   Anesthesia Topical anesthesia was used. Anesthetic medications included Lidocaine  2%, Proparacaine 0.5%.   Procedure Preparation included 5% betadine to ocular surface, eyelid speculum. A supplied (32 g) needle was used.   Injection: 1.25 mg Bevacizumab  1.25mg /0.58ml   Route: Intravitreal, Site: Right Eye   NDC: [id number], Lot: [id number], Expiration date: 09/17/2024   Post-op Post injection exam found visual acuity of at least counting fingers. The patient tolerated the procedure well. There were no complications. The patient received written and verbal post procedure care education. Post injection medications were not given.            ASSESSMENT/PLAN:   ICD-10-CM   1. Branch retinal vein occlusion of right eye with macular edema  H34.8310 OCT, Retina - OU - Both Eyes    Intravitreal Injection, Pharmacologic Agent - OD - Right Eye    Bevacizumab  (AVASTIN ) SOLN 1.25 mg    2.  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 Current use of insulin  (HCC)  Z79.4     4. Long term (current) use of oral hypoglycemic drugs  Z79.84     5. Essential hypertension  I10     6. Hypertensive retinopathy of both eyes  H35.033     7. Posterior vitreous detachment of both eyes  H43.813     8. Pseudophakia of both eyes  Z96.1      1. BRVO with CME OD  - delayed f/u from 9 weeks to 12 weeks (12.30.24 to 03.24.25) - s/p IVA OD #1 (01.19.21), #2 (02.16.21), #3 (03.16.21), #4  (4.13.21), #5 (03.24.25), #6 (05.19.25), #7 (07.07.25) - s/p IVE OD #1 (05.11.21--sample), #2 (06.08.21), #3 (07.06.21), #4 (8.3.21), #5 (08.31.21), #6 (09.28.21), #7 (10.26.21), #8 (11.23.21), #9 (12.22.21), #10 (1.24.22), #11 (2.22.22), #12 (3.22.22), #13 (04.19.22), #14 (05.25.22), #15 (06.29.22), #16 (07.27.22), #17 (08.24.22), #18 (09.21.22), #19 (10.19.22), #20 (11.22.22), #21 (12.20.22), #22 (01.18.23), #23 (02.22.23), #24 (03.28.23), #25 (05.09.23), #26 (06.20.23), #27 (08.07.23), #28 (10.02.23), #29 (11.27.23), #30 (01.22.24), #31 (03.18.24), #32 (05.13.24), #33 (07.08.24), #34 (09.03.24), #35 (10.28.24), #36 (12.30.24)  - s/p focal laser OD (07.13.22); pt may benefit from repeat focal laser OD, but no great targets  - BCVA OD 20/20 - stably improved s/p CEIOL  - exam with focal edema, IRH, CWS superonasal macula -- improved - OCT shows BRVO with persistent IRF/cystic changes SN macula at 7 weeks  - recommend IVA today OD #8 (08.25.25) w/ f/u at 7 wks again  - RBA of procedure discussed, questions answered  - Avastin  informed consent form re-signed and scanned on 03.24.25  - see procedure note - Eyle[employer] benefits investigation started, 05.11.21 -- no funding for Good Days - F/U 7 wks -- DFE/OCT/possible injection;   2-4. Diabetes mellitus, type 2 without retinopathy OU - The incidence, risk factors for progression, natural history and treatment options for diabetic retinopathy  were discussed with patient.   - The need for close monitoring of blood glucose, blood pressure, and serum lipids, avoiding cigarette or any type of tobacco, and the need for long term follow up was also discussed with patient.  - monitor  5,6. Hypertensive retinopathy OU  - discussed importance of tight BP control  - monitor  7. PVD / vitreous syneresis OU  - subacute symptomatic floaters in OS -- no photopsias - OCT shows interval release of partial PVD OS  - Discussed findings and prognosis  - No RT or RD  on 360 peripheral exam  - Reviewed s/s of RT/RD  - Strict return precautions for any such RT/RD signs/symptoms  8. Pseudophakia OU  - s/p CE/IOL OU (Dr. Marcey, OD: 06.05.24, OS: 06.25.24)  - IOLs in good position, doing well  - monitor  Ophthalmic Meds Ordered this visit:  Meds ordered this encounter  Medications   Bevacizumab  (AVASTIN ) SOLN 1.25 mg     Return in about 7 weeks (around 09/28/2024) for BRVO OD, DFE, OCT, Possible Injxn.  There are no Patient Instructions on file for this visit.  This document serves as a record of services personally performed by Redell JUDITHANN Hans, MD, PhD. It was created on their behalf by Avelina Pereyra, COA an ophthalmic technician. The creation of this record is the provider's dictation and/or activities during the visit.   Electronically signed by: Avelina GORMAN Pereyra, COT  08/10/24  3:37 PM   This document serves as a record of services personally performed by  Redell JUDITHANN Hans, MD, PhD. It was created on their behalf by Almetta Pesa, an ophthalmic technician. The creation of this record is the provider's dictation and/or activities during the visit.    Electronically signed by: Almetta Pesa, OA, 08/10/24  3:37 PM  Redell JUDITHANN Hans, M.D., Ph.D. Diseases & Surgery of the Retina and Vitreous [facility]  I have reviewed the above documentation for accuracy and completeness, and I agree with the above. Redell JUDITHANN Hans, M.D., Ph.D. 08/11/24 3:04 AM   Abbreviations: M myopia (nearsighted); A astigmatism; H hyperopia (farsighted); P presbyopia; Mrx spectacle prescription;  CTL contact lenses; OD right eye; OS left eye; OU both eyes  XT exotropia; ET esotropia; PEK punctate epithelial keratitis; PEE punctate epithelial erosions; DES dry eye syndrome; MGD meibomian gland dysfunction; ATs artificial tears; PFAT's preservative free artificial tears; NSC nuclear sclerotic cataract; PSC posterior subcapsular cataract; ERM epi-retinal  membrane; PVD posterior vitreous detachment; RD retinal detachment; DM diabetes mellitus; DR diabetic retinopathy; NPDR non-proliferative diabetic retinopathy; PDR proliferative diabetic retinopathy; CSME clinically significant macular edema; DME diabetic macular edema; dbh dot blot hemorrhages; CWS cotton wool spot; POAG primary open angle glaucoma; C/D cup-to-disc ratio; HVF humphrey visual field; GVF goldmann visual field; OCT optical coherence tomography; IOP intraocular pressure; BRVO Branch retinal vein occlusion; CRVO central retinal vein occlusion; CRAO central retinal artery occlusion; BRAO branch retinal artery occlusion; RT retinal tear; SB scleral buckle; PPV pars plana vitrectomy; VH Vitreous hemorrhage; PRP panretinal laser photocoagulation; IVK intravitreal kenalog; VMT vitreomacular traction; MH Macular hole;  NVD neovascularization of the disc; NVE neovascularization elsewhere; AREDS age related eye disease study; ARMD age related macular degeneration; POAG primary open angle glaucoma; EBMD epithelial/anterior basement membrane dystrophy; ACIOL anterior chamber intraocular lens; IOL intraocular lens; PCIOL posterior chamber intraocular lens; Phaco/IOL phacoemulsification with intraocular lens placement; PRK photorefractive keratectomy; LASIK laser assisted in situ keratomileusis; HTN hypertension; DM diabetes mellitus; COPD chronic obstructive pulmonary disease

## 2024-08-10 ENCOUNTER — Encounter (INDEPENDENT_AMBULATORY_CARE_PROVIDER_SITE_OTHER): Payer: Self-pay | Admitting: Ophthalmology

## 2024-08-10 ENCOUNTER — Ambulatory Visit (INDEPENDENT_AMBULATORY_CARE_PROVIDER_SITE_OTHER): Admitting: Ophthalmology

## 2024-08-10 DIAGNOSIS — Z7984 Long term (current) use of oral hypoglycemic drugs: Secondary | ICD-10-CM | POA: Diagnosis not present

## 2024-08-10 DIAGNOSIS — E119 Type 2 diabetes mellitus without complications: Secondary | ICD-10-CM

## 2024-08-10 DIAGNOSIS — H34831 Tributary (branch) retinal vein occlusion, right eye, with macular edema: Secondary | ICD-10-CM | POA: Diagnosis not present

## 2024-08-10 DIAGNOSIS — H35033 Hypertensive retinopathy, bilateral: Secondary | ICD-10-CM

## 2024-08-10 DIAGNOSIS — Z961 Presence of intraocular lens: Secondary | ICD-10-CM

## 2024-08-10 DIAGNOSIS — Z794 Long term (current) use of insulin: Secondary | ICD-10-CM

## 2024-08-10 DIAGNOSIS — H43813 Vitreous degeneration, bilateral: Secondary | ICD-10-CM

## 2024-08-10 DIAGNOSIS — I10 Essential (primary) hypertension: Secondary | ICD-10-CM

## 2024-08-10 MED ORDER — BEVACIZUMAB CHEMO INJECTION 1.25MG/0.05ML SYRINGE FOR KALEIDOSCOPE
1.2500 mg | INTRAVITREAL | Status: AC | PRN
  Administered 2024-08-10: 1.25 mg via INTRAVITREAL

## 2024-09-16 NOTE — Progress Notes (Signed)
 [facility] - Clinic Note  09/28/2024     CHIEF COMPLAINT Patient presents for Retina Follow Up  HISTORY OF PRESENT ILLNESS: Kurt [name] is a 73 y.o. male who presents to the clinic today for:  HPI     Retina Follow Up   Patient presents with  CRVO/BRVO.  In right eye.  This started 7 weeks ago.  I, the attending physician,  performed the HPI with the patient and updated documentation appropriately.        Comments   Patient here for 7 weeks retina follow up for BRVO OD. Patient states vision doing pretty good. Having problems OD. Has to blink several times to see. Has dry eyes uses [place]. No eye pain. They just itch dry eyes.       Last edited by Valdemar Rogue, MD on 09/28/2024  9:53 PM.     Patient feels the vision is good.  Referring physician:  [facility] [address] [place],  [place] [address]  HISTORICAL INFORMATION:   Selected notes from the MEDICAL RECORD NUMBER Diabetic Eval per Dr. Jacques   CURRENT MEDICATIONS: No current outpatient medications on file. (Ophthalmic Drugs)   No current facility-administered medications for this visit. (Ophthalmic Drugs)   Current Outpatient Medications (Other)  Medication Sig   acetaminophen  (TYLENOL ) 650 MG CR tablet Take 1,300 mg by mouth every 8 (eight) hours as needed for pain.   aspirin  81 MG chewable tablet Chew 1 tablet (81 mg total) by mouth daily.   B-D ULTRAFINE III SHORT PEN 31G X 8 MM MISC Inject into the skin.   docusate sodium  (COLACE) 100 MG capsule Take 100 mg by mouth 2 (two) times daily.   glipiZIDE  (GLUCOTROL  XL) 10 MG 24 hr tablet Take 1 tablet (10 mg total) by mouth daily with breakfast.   HYDROcodone -acetaminophen  (NORCO) 7.5-325 MG tablet Take 1 tablet by mouth daily as needed for moderate pain.   ketoconazole (NIZORAL) 2 % cream Apply 1 Application topically daily.   LANTUS  SOLOSTAR 100 UNIT/ML Solostar Pen Inject 34 Units into the skin at bedtime.    lisinopril  (ZESTRIL ) 20 MG tablet Take 20 mg by mouth daily.   LORazepam  (ATIVAN ) 1 MG tablet Take 1 tablet 30 to 60 minutes before leaving house on day of office surgery.  Bring second tablet with you to office on day of office surgery.   methocarbamol  (ROBAXIN ) 500 MG tablet Take 500 mg by mouth every 8 (eight) hours as needed for muscle spasms.   Multiple Vitamin (MULTIVITAMIN) tablet Take 1 tablet by mouth daily.   Omega-3 Fatty Acids (FISH OIL) 1000 MG CAPS Take by mouth.   ONETOUCH ULTRA test strip 1 each 2 (two) times daily.   simvastatin  (ZOCOR ) 20 MG tablet Take 20 mg by mouth every evening.   SitaGLIPtin -MetFORMIN  HCl [phone] MG TB24 Take 1 tablet by mouth at bedtime.   zinc gluconate 50 MG tablet Take 50 mg by mouth daily.   No current facility-administered medications for this visit. (Other)   REVIEW OF SYSTEMS: ROS   Positive for: Musculoskeletal, Endocrine, Eyes Negative for: Constitutional, Gastrointestinal, Neurological, Skin, Genitourinary, HENT, Cardiovascular, Respiratory, Psychiatric, Allergic/Imm, Heme/Lymph Last edited by Orval Asberry RAMAN, COA on 09/28/2024  2:23 PM.      ALLERGIES Allergies  Allergen Reactions   Augmentin [Amoxicillin-Pot Clavulanate] Nausea And Vomiting   Iodine Hives    Pt had IV contrast 10/16/23 without any issue (per Dr Adele)   Tape  Rash    Paper    PAST MEDICAL HISTORY Past Medical History:  Diagnosis Date   Arthritis    Bronchitis    Bronchitis    Cataract    OU   Concussion    late 1990's  after a fall   GERD (gastroesophageal reflux disease)    History of kidney stones    Hypertension    Hypertensive retinopathy    OU   Osteogenesis imperfecta    Peripheral vascular disease    PONV (postoperative nausea and vomiting)    pt has post polio syndrome   Post-polio syndrome (HCC)    Type 2 diabetes mellitus (HCC) 12/25/2018   Past Surgical History:  Procedure Laterality Date   ANKLE FRACTURE SURGERY Bilateral     arm surgery Right    nerve surgery   COLONOSCOPY     ELBOW FRACTURE SURGERY Left    EYE MUSCLE SURGERY Left    LEG SURGERY Left    femur fracture with rod   REVERSE SHOULDER ARTHROPLASTY Right 11/10/2019   Procedure: RIGHT REVERSE SHOULDER ARTHROPLASTY;  Surgeon: Addie Cordella Hamilton, MD;  Location: [facility] OR;  Service: Orthopedics;  Laterality: Right;   TONSILLECTOMY     FAMILY HISTORY Family History  Problem Relation Age of Onset   Hypertension Mother    Diabetes Brother    SOCIAL HISTORY Social History   Tobacco Use   Smoking status: Never   Smokeless tobacco: Never  Vaping Use   Vaping status: Never Used  Substance Use Topics   Alcohol use: Yes    Comment: 1 beer occasionally   Drug use: No       OPHTHALMIC EXAM: Base Eye Exam     Visual Acuity (Snellen - Linear)       Right Left   Dist cc 20/25 -1 20/20 -2   Dist ph cc 20/20 -2     Correction: Glasses         Tonometry (Tonopen, 2:20 PM)       Right Left   Pressure 14 14         Pupils       Dark Light Shape React APD   Right 3 2 Round Minimal None   Left 3 2 Round Minimal None         Visual Fields (Counting fingers)       Left Right    Full Full         Extraocular Movement       Right Left    Full, Ortho Full, Ortho         Neuro/Psych     Oriented x3: Yes   Mood/Affect: Normal         Dilation     Both eyes: 1.0% Mydriacyl, 2.5% Phenylephrine  @ 2:20 PM           Slit Lamp and Fundus Exam     Slit Lamp Exam       Right Left   Lids/Lashes Dermatochalasis - upper lid, mild Meibomian gland dysfunction Dermatochalasis - upper lid, Dermatochalasis - lower lid, mild Meibomian gland dysfunction   Conjunctiva/Sclera blue sclera blue sclera   Cornea arcus, well healed cataract wound, trace Punctate epithelial erosions, Debris in tear film arcus, well healed cataract wound, trace Debris in tear film   Anterior Chamber deep and clear deep and clear   Iris round and  dilated, no NVI round and dilated, no NVI   Lens PC IOL in good position PC  IOL in good position   Anterior Vitreous mild syneresis, PVD, vitreous condensations mild syneresis, PVD         Fundus Exam       Right Left   Disc pink and sharp Pink and Sharp   C/D Ratio 0.5 0.3   Macula Good foveal reflex; trace persistent edema SN macula, mild focal laser changes, no heme -- no great targets for focal laser flat, good foveal reflex, RPE mottling and clumping, fine drusen, No heme or edema   Vessels attenuated, mild tortuosity attenuated, Tortuous   Periphery attached, no heme attached, no heme           Refraction     Wearing Rx       Sphere Cylinder Axis Add   Right -1.25 +1.75 113 +2.75   Left -1.50 +1.75 114 +2.75    Type: Bifocal           IMAGING AND PROCEDURES  Imaging and Procedures for  OCT, Retina - OU - Both Eyes       Right Eye Quality was good. Central Foveal Thickness: 244. Progression has been stable. Findings include normal foveal contour, no SRF, retinal drusen , intraretinal fluid, outer retinal atrophy (Mild peristent IRF/cystic changes SN macula ).   Left Eye Quality was good. Central Foveal Thickness: 249. Progression has been stable. Findings include normal foveal contour, no IRF, no SRF, retinal drusen , vitreomacular adhesion (Focal PED/drusen temporal fovea, no fluid; stable release of partial PVD).   Notes *Images captured and stored on drive  Diagnosis / Impression:  OD: BRVO with mild persistent IRF/cystic changes SN macula   OS: NFP, no SRF/IRF, Focal PED/drusen temporal fovea, no fluid, stable release of partial PVD  Clinical management:  See below  Abbreviations: NFP - Normal foveal profile. CME - cystoid macular edema. PED - pigment epithelial detachment. IRF - intraretinal fluid. SRF - subretinal fluid. EZ - ellipsoid zone. ERM - epiretinal membrane. ORA - outer retinal atrophy. ORT - outer retinal tubulation. SRHM - subretinal  hyper-reflective material       Intravitreal Injection, Pharmacologic Agent - OD - Right Eye       Time Out 09/28/2024. 2:51 PM. Confirmed correct patient, procedure, site, and patient consented.   Anesthesia Topical anesthesia was used. Anesthetic medications included Lidocaine  2%, Proparacaine 0.5%.   Procedure Preparation included 5% betadine to ocular surface, eyelid speculum. A supplied (32 g) needle was used.   Injection: 1.25 mg Bevacizumab  1.25mg /0.51ml   Route: Intravitreal, Site: Right Eye   NDC: [id number], Lot: [id number], Expiration date: 10/05/2024   Post-op Post injection exam found visual acuity of at least counting fingers. The patient tolerated the procedure well. There were no complications. The patient received written and verbal post procedure care education. Post injection medications were not given.             ASSESSMENT/PLAN:   ICD-10-CM   1. Branch retinal vein occlusion of right eye with macular edema (HCC)  H34.8310 OCT, Retina - OU - Both Eyes    Intravitreal Injection, Pharmacologic Agent - OD - Right Eye    Bevacizumab  (AVASTIN ) SOLN 1.25 mg    2. 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 Current use of insulin  (HCC)  Z79.4     4. Long term (current) use of oral hypoglycemic drugs  Z79.84     5. Essential hypertension  I10     6. Hypertensive retinopathy of both eyes  H35.033     7. Posterior vitreous detachment of both eyes  H43.813     8. Pseudophakia of both eyes  Z96.1      1. BRVO with CME OD  - delayed f/u from 9 weeks to 12 weeks (12.30.24 to 03.24.25) - s/p IVA OD #1 (01.19.21), #2 (02.16.21), #3 (03.16.21), #4 (4.13.21), #5 (03.24.25), #6 (05.19.25), #7 (07.07.25), #8 (08.25.25) ========== - s/p IVE OD #1 (05.11.21 sample), #2 (06.08.21), #3 (07.06.21), #4 (8.3.21), #5 (08.31.21), #6 (09.28.21), #7 (10.26.21), #8 (11.23.21), #9 (12.22.21), #10 (01.24.22), #11 (02.22.22), #12 (03.22.22), #13 (04.19.22),  #14 (05.25.22), #15 (06.29.22), #16 (07.27.22), #17 (08.24.22), #18 (09.21.22), #19 (10.19.22), #20 (11.22.22), #21 (12.20.22), #22 (01.18.23), #23 (02.22.23), #24 (03.28.23), #25 (05.09.23), #26 (06.20.23), #27 (08.07.23), #28 (10.02.23), #29 (11.27.23), #30 (01.22.24), #31 (03.18.24), #32 (05.13.24), #33 (07.08.24), #34 (09.03.24), #35 (10.28.24), #36 (12.30.24) - s/p focal laser OD (07.13.22); pt may benefit from repeat focal laser OD, but no great targets  - BCVA OD 20/20 - stably improved s/p CEIOL  - exam with focal edema, IRH, CWS superonasal macula -- improved - OCT shows BRVO with mild persistent IRF/cystic changes SN macula at 7 weeks  - recommend IVA today OD #9 (10.13.25) w/ f/u ext to 8 wks  - RBA of procedure discussed, questions answered  - Avastin  informed consent form re-signed and scanned on 03.24.25  - see procedure note - [employer] benefits investigation started, 05.11.21 -- no funding for Good Days - F/U 8 wks -- DFE/OCT/possible injection   2-4. Diabetes mellitus, type 2 without retinopathy OU - The incidence, risk factors for progression, natural history and treatment options for diabetic retinopathy  were discussed with patient.   - The need for close monitoring of blood glucose, blood pressure, and serum lipids, avoiding cigarette or any type of tobacco, and the need for long term follow up was also discussed with patient.  - monitor  5,6. Hypertensive retinopathy OU  - discussed importance of tight BP control  - monitor  7. PVD / vitreous syneresis OU  - subacute symptomatic floaters in OS -- no photopsias - OCT shows interval release of partial PVD OS  - Discussed findings and prognosis  - No RT or RD on 360 peripheral exam  - Reviewed s/s of RT/RD  - Strict return precautions for any such RT/RD signs/symptoms  8. Pseudophakia OU  - s/p CE/IOL OU (Dr. Marcey, OD: 06.05.24, OS: 06.25.24)  - IOLs in good position, doing well  - monitor  Ophthalmic Meds Ordered  this visit:  Meds ordered this encounter  Medications   Bevacizumab  (AVASTIN ) SOLN 1.25 mg     Return in about 8 weeks (around 11/23/2024) for f/u, BRVO, DFE, OCT, Possible, IVA, OD.  There are no Patient Instructions on file for this visit.  This document serves as a record of services personally performed by Redell JUDITHANN Hans, MD, PhD. It was created on their behalf by Avelina Pereyra, COA an ophthalmic technician. The creation of this record is the provider's dictation and/or activities during the visit.   Electronically signed by: Avelina GORMAN Pereyra, COT  09/28/24  9:54 PM   This document serves as a record of services personally performed by Redell JUDITHANN Hans, MD, PhD. It was created on their behalf by Wanda GEANNIE Keens, COT an ophthalmic technician. The creation of this record is the provider's dictation and/or activities during the visit.    Electronically signed by:  Wanda GEANNIE Keens, COT  09/28/24 9:54 PM  Redell MATSU.  Valdemar, M.D., Ph.D. Diseases & Surgery of the Retina and Vitreous [facility]  I have reviewed the above documentation for accuracy and completeness, and I agree with the above. Redell JUDITHANN Valdemar, M.D., Ph.D. 09/28/24 9:55 PM   Abbreviations: M myopia (nearsighted); A astigmatism; H hyperopia (farsighted); P presbyopia; Mrx spectacle prescription;  CTL contact lenses; OD right eye; OS left eye; OU both eyes  XT exotropia; ET esotropia; PEK punctate epithelial keratitis; PEE punctate epithelial erosions; DES dry eye syndrome; MGD meibomian gland dysfunction; ATs artificial tears; PFAT's preservative free artificial tears; NSC nuclear sclerotic cataract; PSC posterior subcapsular cataract; ERM epi-retinal membrane; PVD posterior vitreous detachment; RD retinal detachment; DM diabetes mellitus; DR diabetic retinopathy; NPDR non-proliferative diabetic retinopathy; PDR proliferative diabetic retinopathy; CSME clinically significant macular edema; DME diabetic  macular edema; dbh dot blot hemorrhages; CWS cotton wool spot; POAG primary open angle glaucoma; C/D cup-to-disc ratio; HVF humphrey visual field; GVF goldmann visual field; OCT optical coherence tomography; IOP intraocular pressure; BRVO Branch retinal vein occlusion; CRVO central retinal vein occlusion; CRAO central retinal artery occlusion; BRAO branch retinal artery occlusion; RT retinal tear; SB scleral buckle; PPV pars plana vitrectomy; VH Vitreous hemorrhage; PRP panretinal laser photocoagulation; IVK intravitreal kenalog; VMT vitreomacular traction; MH Macular hole;  NVD neovascularization of the disc; NVE neovascularization elsewhere; AREDS age related eye disease study; ARMD age related macular degeneration; POAG primary open angle glaucoma; EBMD epithelial/anterior basement membrane dystrophy; ACIOL anterior chamber intraocular lens; IOL intraocular lens; PCIOL posterior chamber intraocular lens; Phaco/IOL phacoemulsification with intraocular lens placement; PRK photorefractive keratectomy; LASIK laser assisted in situ keratomileusis; HTN hypertension; DM diabetes mellitus; COPD chronic obstructive pulmonary disease

## 2024-09-28 ENCOUNTER — Encounter (INDEPENDENT_AMBULATORY_CARE_PROVIDER_SITE_OTHER): Payer: Self-pay | Admitting: Ophthalmology

## 2024-09-28 ENCOUNTER — Ambulatory Visit (INDEPENDENT_AMBULATORY_CARE_PROVIDER_SITE_OTHER): Admitting: Ophthalmology

## 2024-09-28 DIAGNOSIS — Z7984 Long term (current) use of oral hypoglycemic drugs: Secondary | ICD-10-CM | POA: Diagnosis not present

## 2024-09-28 DIAGNOSIS — H35033 Hypertensive retinopathy, bilateral: Secondary | ICD-10-CM

## 2024-09-28 DIAGNOSIS — E119 Type 2 diabetes mellitus without complications: Secondary | ICD-10-CM

## 2024-09-28 DIAGNOSIS — Z794 Long term (current) use of insulin: Secondary | ICD-10-CM | POA: Diagnosis not present

## 2024-09-28 DIAGNOSIS — Z961 Presence of intraocular lens: Secondary | ICD-10-CM

## 2024-09-28 DIAGNOSIS — H34831 Tributary (branch) retinal vein occlusion, right eye, with macular edema: Secondary | ICD-10-CM | POA: Diagnosis not present

## 2024-09-28 DIAGNOSIS — I10 Essential (primary) hypertension: Secondary | ICD-10-CM

## 2024-09-28 DIAGNOSIS — H43813 Vitreous degeneration, bilateral: Secondary | ICD-10-CM

## 2024-09-28 MED ORDER — BEVACIZUMAB CHEMO INJECTION 1.25MG/0.05ML SYRINGE FOR KALEIDOSCOPE
1.2500 mg | INTRAVITREAL | Status: AC | PRN
  Administered 2024-09-28: 1.25 mg via INTRAVITREAL

## 2024-11-10 NOTE — Progress Notes (Shared)
 [facility] - Clinic Note  11/23/2024     CHIEF COMPLAINT Patient presents for No chief complaint on file.  HISTORY OF PRESENT ILLNESS: Kurt [name] is a 73 y.o. male who presents to the clinic today for:    Patient feels the vision is good.  Referring physician:  [facility] [address] [place],  [place] [address]  HISTORICAL INFORMATION:   Selected notes from the MEDICAL RECORD NUMBER Diabetic Eval per Dr. Jacques   CURRENT MEDICATIONS: No current outpatient medications on file. (Ophthalmic Drugs)   No current facility-administered medications for this visit. (Ophthalmic Drugs)   Current Outpatient Medications (Other)  Medication Sig   acetaminophen  (TYLENOL ) 650 MG CR tablet Take 1,300 mg by mouth every 8 (eight) hours as needed for pain.   aspirin  81 MG chewable tablet Chew 1 tablet (81 mg total) by mouth daily.   B-D ULTRAFINE III SHORT PEN 31G X 8 MM MISC Inject into the skin.   docusate sodium  (COLACE) 100 MG capsule Take 100 mg by mouth 2 (two) times daily.   glipiZIDE  (GLUCOTROL  XL) 10 MG 24 hr tablet Take 1 tablet (10 mg total) by mouth daily with breakfast.   HYDROcodone -acetaminophen  (NORCO) 7.5-325 MG tablet Take 1 tablet by mouth daily as needed for moderate pain.   ketoconazole (NIZORAL) 2 % cream Apply 1 Application topically daily.   LANTUS  SOLOSTAR 100 UNIT/ML Solostar Pen Inject 34 Units into the skin at bedtime.   lisinopril  (ZESTRIL ) 20 MG tablet Take 20 mg by mouth daily.   LORazepam  (ATIVAN ) 1 MG tablet Take 1 tablet 30 to 60 minutes before leaving house on day of office surgery.  Bring second tablet with you to office on day of office surgery.   methocarbamol  (ROBAXIN ) 500 MG tablet Take 500 mg by mouth every 8 (eight) hours as needed for muscle spasms.   Multiple Vitamin (MULTIVITAMIN) tablet Take 1 tablet by mouth daily.   Omega-3 Fatty Acids (FISH OIL) 1000 MG CAPS Take by mouth.   ONETOUCH ULTRA test  strip 1 each 2 (two) times daily.   simvastatin  (ZOCOR ) 20 MG tablet Take 20 mg by mouth every evening.   SitaGLIPtin -MetFORMIN  HCl [phone] MG TB24 Take 1 tablet by mouth at bedtime.   zinc gluconate 50 MG tablet Take 50 mg by mouth daily.   No current facility-administered medications for this visit. (Other)   REVIEW OF SYSTEMS:    ALLERGIES Allergies  Allergen Reactions   Augmentin [Amoxicillin-Pot Clavulanate] Nausea And Vomiting   Iodine Hives    Pt had IV contrast 10/16/23 without any issue (per Dr Adele)   Tape Rash    Paper    PAST MEDICAL HISTORY Past Medical History:  Diagnosis Date   Arthritis    Bronchitis    Bronchitis    Cataract    OU   Concussion    late 1990's  after a fall   GERD (gastroesophageal reflux disease)    History of kidney stones    Hypertension    Hypertensive retinopathy    OU   Osteogenesis imperfecta    Peripheral vascular disease    PONV (postoperative nausea and vomiting)    pt has post polio syndrome   Post-polio syndrome (HCC)    Type 2 diabetes mellitus (HCC) 12/25/2018   Past Surgical History:  Procedure Laterality Date   ANKLE FRACTURE SURGERY Bilateral    arm surgery Right    nerve surgery   COLONOSCOPY  ELBOW FRACTURE SURGERY Left    EYE MUSCLE SURGERY Left    LEG SURGERY Left    femur fracture with rod   REVERSE SHOULDER ARTHROPLASTY Right 11/10/2019   Procedure: RIGHT REVERSE SHOULDER ARTHROPLASTY;  Surgeon: Addie Cordella Hamilton, MD;  Location: [facility] OR;  Service: Orthopedics;  Laterality: Right;   TONSILLECTOMY     FAMILY HISTORY Family History  Problem Relation Age of Onset   Hypertension Mother    Diabetes Brother    SOCIAL HISTORY Social History   Tobacco Use   Smoking status: Never   Smokeless tobacco: Never  Vaping Use   Vaping status: Never Used  Substance Use Topics   Alcohol use: Yes    Comment: 1 beer occasionally   Drug use: No       OPHTHALMIC EXAM: Not recorded    IMAGING AND  PROCEDURES  Imaging and Procedures for           ASSESSMENT/PLAN:   ICD-10-CM   1. 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 Current use of insulin  (HCC)  Z79.4     4. Long term (current) use of oral hypoglycemic drugs  Z79.84     5. Essential hypertension  I10     6. Hypertensive retinopathy of both eyes  H35.033     7. Posterior vitreous detachment of both eyes  H43.813     8. Pseudophakia of both eyes  Z96.1       1. BRVO with CME OD  - delayed f/u from 9 weeks to 12 weeks (12.30.24 to 03.24.25) - s/p IVA OD #1 (01.19.21), #2 (02.16.21), #3 (03.16.21), #4 (4.13.21), #5 (03.24.25), #6 (05.19.25), #7 (07.07.25), #8 (08.25.25), #9 (10.13.25) ========== - s/p IVE OD #1 (05.11.21 sample), #2 (06.08.21), #3 (07.06.21), #4 (8.3.21), #5 (08.31.21), #6 (09.28.21), #7 (10.26.21), #8 (11.23.21), #9 (12.22.21), #10 (01.24.22), #11 (02.22.22), #12 (03.22.22), #13 (04.19.22), #14 (05.25.22), #15 (06.29.22), #16 (07.27.22), #17 (08.24.22), #18 (09.21.22), #19 (10.19.22), #20 (11.22.22), #21 (12.20.22), #22 (01.18.23), #23 (02.22.23), #24 (03.28.23), #25 (05.09.23), #26 (06.20.23), #27 (08.07.23), #28 (10.02.23), #29 (11.27.23), #30 (01.22.24), #31 (03.18.24), #32 (05.13.24), #33 (07.08.24), #34 (09.03.24), #35 (10.28.24), #36 (12.30.24) - s/p focal laser OD (07.13.22); pt may benefit from repeat focal laser OD, but no great targets  - BCVA OD 20/20 - stably improved s/p CEIOL  - exam with focal edema, IRH, CWS superonasal macula -- improved - OCT shows BRVO with mild persistent IRF/cystic changes SN macula at 7 weeks  - recommend IVA today OD #10 (12.08.25) w/ f/u ext to 8 wks  - RBA of procedure discussed, questions answered  - Avastin  informed consent form re-signed and scanned on 03.24.25  - see procedure note - [employer] benefits investigation started, 05.11.21 -- no funding for  Good Days - F/U 8 wks -- DFE/OCT/possible injection   2-4. Diabetes mellitus, type 2 without retinopathy OU - The incidence, risk factors for progression, natural history and treatment options for diabetic retinopathy  were discussed with patient.   - The need for close monitoring of blood glucose, blood pressure, and serum lipids, avoiding cigarette or any type of tobacco, and the need for long term follow up was also discussed with patient.  - monitor  5,6. Hypertensive retinopathy OU  - discussed importance of tight BP control  - monitor  7. PVD / vitreous syneresis OU  - subacute  symptomatic floaters in OS -- no photopsias - OCT shows interval release of partial PVD OS  - Discussed findings and prognosis  - No RT or RD on 360 peripheral exam  - Reviewed s/s of RT/RD  - Strict return precautions for any such RT/RD signs/symptoms  8. Pseudophakia OU  - s/p CE/IOL OU (Dr. Marcey, OD: 06.05.24, OS: 06.25.24)  - IOLs in good position, doing well  - monitor  Ophthalmic Meds Ordered this visit:  No orders of the defined types were placed in this encounter.    No follow-ups on file.  There are no Patient Instructions on file for this visit.  This document serves as a record of services personally performed by Redell JUDITHANN Hans, MD, PhD. It was created on their behalf by Avelina Pereyra, COA an ophthalmic technician. The creation of this record is the provider's dictation and/or activities during the visit.   Electronically signed by: Avelina GORMAN Pereyra, COT  11/10/24  12:19 PM     Redell JUDITHANN Hans, M.D., Ph.D. Diseases & Surgery of the Retina and Vitreous [facility]    Abbreviations: M myopia (nearsighted); A astigmatism; H hyperopia (farsighted); P presbyopia; Mrx spectacle prescription;  CTL contact lenses; OD right eye; OS left eye; OU both eyes  XT exotropia; ET esotropia; PEK punctate epithelial keratitis; PEE punctate epithelial erosions; DES dry eye  syndrome; MGD meibomian gland dysfunction; ATs artificial tears; PFAT's preservative free artificial tears; NSC nuclear sclerotic cataract; PSC posterior subcapsular cataract; ERM epi-retinal membrane; PVD posterior vitreous detachment; RD retinal detachment; DM diabetes mellitus; DR diabetic retinopathy; NPDR non-proliferative diabetic retinopathy; PDR proliferative diabetic retinopathy; CSME clinically significant macular edema; DME diabetic macular edema; dbh dot blot hemorrhages; CWS cotton wool spot; POAG primary open angle glaucoma; C/D cup-to-disc ratio; HVF humphrey visual field; GVF goldmann visual field; OCT optical coherence tomography; IOP intraocular pressure; BRVO Branch retinal vein occlusion; CRVO central retinal vein occlusion; CRAO central retinal artery occlusion; BRAO branch retinal artery occlusion; RT retinal tear; SB scleral buckle; PPV pars plana vitrectomy; VH Vitreous hemorrhage; PRP panretinal laser photocoagulation; IVK intravitreal kenalog; VMT vitreomacular traction; MH Macular hole;  NVD neovascularization of the disc; NVE neovascularization elsewhere; AREDS age related eye disease study; ARMD age related macular degeneration; POAG primary open angle glaucoma; EBMD epithelial/anterior basement membrane dystrophy; ACIOL anterior chamber intraocular lens; IOL intraocular lens; PCIOL posterior chamber intraocular lens; Phaco/IOL phacoemulsification with intraocular lens placement; PRK photorefractive keratectomy; LASIK laser assisted in situ keratomileusis; HTN hypertension; DM diabetes mellitus; COPD chronic obstructive pulmonary disease

## 2024-11-23 ENCOUNTER — Encounter (INDEPENDENT_AMBULATORY_CARE_PROVIDER_SITE_OTHER): Admitting: Ophthalmology

## 2024-11-23 DIAGNOSIS — I10 Essential (primary) hypertension: Secondary | ICD-10-CM

## 2024-11-23 DIAGNOSIS — Z7984 Long term (current) use of oral hypoglycemic drugs: Secondary | ICD-10-CM

## 2024-11-23 DIAGNOSIS — Z794 Long term (current) use of insulin: Secondary | ICD-10-CM

## 2024-11-23 DIAGNOSIS — Z961 Presence of intraocular lens: Secondary | ICD-10-CM

## 2024-11-23 DIAGNOSIS — H34831 Tributary (branch) retinal vein occlusion, right eye, with macular edema: Secondary | ICD-10-CM

## 2024-11-23 DIAGNOSIS — H35033 Hypertensive retinopathy, bilateral: Secondary | ICD-10-CM

## 2024-11-23 DIAGNOSIS — E119 Type 2 diabetes mellitus without complications: Secondary | ICD-10-CM

## 2024-11-23 DIAGNOSIS — H43813 Vitreous degeneration, bilateral: Secondary | ICD-10-CM

## 2024-11-30 NOTE — Progress Notes (Shared)
 [facility] - Clinic Note  12/02/2024     CHIEF COMPLAINT Patient presents for Retina Follow Up  HISTORY OF PRESENT ILLNESS: Kurt [name] is a 73 y.o. male who presents to the clinic today for:  HPI     Retina Follow Up   Patient presents with  CRVO/BRVO.  In right eye.  This started 8 weeks ago.  Duration of 8 weeks.  Since onset it is gradually worsening.        Comments   8 week retina follow up BRVO OD and IVA OD pt is reporting that he is having trouble with seeing at near he has trouble with seeing line to sign names as well pt last reading 127 last night before dinner A1C 7.20 November 2024       Last edited by Resa Delon ORN, COT on 12/02/2024 12:21 PM.      Patient states he fell outside and hit the gravel. He is delayed due to weather.   Referring physician:  [facility] [address] [place],  [place] [address]  HISTORICAL INFORMATION:   Selected notes from the MEDICAL RECORD NUMBER Diabetic Eval per Dr. Jacques   CURRENT MEDICATIONS: No current outpatient medications on file. (Ophthalmic Drugs)   No current facility-administered medications for this visit. (Ophthalmic Drugs)   Current Outpatient Medications (Other)  Medication Sig   acetaminophen  (TYLENOL ) 650 MG CR tablet Take 1,300 mg by mouth every 8 (eight) hours as needed for pain.   aspirin  81 MG chewable tablet Chew 1 tablet (81 mg total) by mouth daily.   B-D ULTRAFINE III SHORT PEN 31G X 8 MM MISC Inject into the skin.   docusate sodium  (COLACE) 100 MG capsule Take 100 mg by mouth 2 (two) times daily.   glipiZIDE  (GLUCOTROL  XL) 10 MG 24 hr tablet Take 1 tablet (10 mg total) by mouth daily with breakfast.   HYDROcodone -acetaminophen  (NORCO) 7.5-325 MG tablet Take 1 tablet by mouth daily as needed for moderate pain.   ketoconazole (NIZORAL) 2 % cream Apply 1 Application topically daily.   LANTUS  SOLOSTAR 100 UNIT/ML Solostar Pen Inject 34 Units  into the skin at bedtime.   lisinopril  (ZESTRIL ) 20 MG tablet Take 20 mg by mouth daily.   LORazepam  (ATIVAN ) 1 MG tablet Take 1 tablet 30 to 60 minutes before leaving house on day of office surgery.  Bring second tablet with you to office on day of office surgery.   methocarbamol  (ROBAXIN ) 500 MG tablet Take 500 mg by mouth every 8 (eight) hours as needed for muscle spasms.   Multiple Vitamin (MULTIVITAMIN) tablet Take 1 tablet by mouth daily.   Omega-3 Fatty Acids (FISH OIL) 1000 MG CAPS Take by mouth.   ONETOUCH ULTRA test strip 1 each 2 (two) times daily.   simvastatin  (ZOCOR ) 20 MG tablet Take 20 mg by mouth every evening.   SitaGLIPtin -MetFORMIN  HCl [phone] MG TB24 Take 1 tablet by mouth at bedtime.   zinc gluconate 50 MG tablet Take 50 mg by mouth daily.   No current facility-administered medications for this visit. (Other)   REVIEW OF SYSTEMS: ROS   Positive for: Musculoskeletal, Endocrine, Eyes Negative for: Constitutional, Gastrointestinal, Neurological, Skin, Genitourinary, HENT, Cardiovascular, Respiratory, Psychiatric, Allergic/Imm, Heme/Lymph Last edited by Resa Delon ORN, COT on 12/02/2024 12:20 PM.       ALLERGIES Allergies  Allergen Reactions   Augmentin [Amoxicillin-Pot Clavulanate] Nausea And Vomiting   Iodine Hives    Pt had  IV contrast 10/16/23 without any issue (per Dr Adele)   Tape Rash    Paper    PAST MEDICAL HISTORY Past Medical History:  Diagnosis Date   Arthritis    Bronchitis    Bronchitis    Cataract    OU   Concussion    late 1990's  after a fall   GERD (gastroesophageal reflux disease)    History of kidney stones    Hypertension    Hypertensive retinopathy    OU   Osteogenesis imperfecta    Peripheral vascular disease    PONV (postoperative nausea and vomiting)    pt has post polio syndrome   Post-polio syndrome (HCC)    Type 2 diabetes mellitus (HCC) 12/25/2018   Past Surgical History:  Procedure Laterality Date   ANKLE  FRACTURE SURGERY Bilateral    arm surgery Right    nerve surgery   COLONOSCOPY     ELBOW FRACTURE SURGERY Left    EYE MUSCLE SURGERY Left    LEG SURGERY Left    femur fracture with rod   REVERSE SHOULDER ARTHROPLASTY Right 11/10/2019   Procedure: RIGHT REVERSE SHOULDER ARTHROPLASTY;  Surgeon: Addie Cordella Hamilton, MD;  Location: [facility] OR;  Service: Orthopedics;  Laterality: Right;   TONSILLECTOMY     FAMILY HISTORY Family History  Problem Relation Age of Onset   Hypertension Mother    Diabetes Brother    SOCIAL HISTORY Social History   Tobacco Use   Smoking status: Never   Smokeless tobacco: Never  Vaping Use   Vaping status: Never Used  Substance Use Topics   Alcohol use: Yes    Comment: 1 beer occasionally   Drug use: No       OPHTHALMIC EXAM: Base Eye Exam     Visual Acuity (Snellen - Linear)       Right Left   Dist cc 20/30 -2 20/30   Dist ph cc 20/25 -2 20/25 -2         Tonometry (Tonopen, 12:31 PM)       Right Left   Pressure 15 16         Pupils       Pupils Dark Light Shape React APD   Right PERRL 3 2 Round Brisk None   Left PERRL 3 2 Round Brisk None         Visual Fields       Left Right    Full Full         Extraocular Movement       Right Left    Full, Ortho Full, Ortho         Neuro/Psych     Oriented x3: Yes   Mood/Affect: Normal         Dilation     Both eyes: 2.5% Phenylephrine  @ 12:31 PM           Slit Lamp and Fundus Exam     Slit Lamp Exam       Right Left   Lids/Lashes Dermatochalasis - upper lid, mild Meibomian gland dysfunction Dermatochalasis - upper lid, Dermatochalasis - lower lid, mild Meibomian gland dysfunction   Conjunctiva/Sclera blue sclera blue sclera   Cornea arcus, well healed cataract wound, trace Punctate epithelial erosions, Debris in tear film arcus, well healed cataract wound, trace Debris in tear film   Anterior Chamber deep and clear deep and clear   Iris round and dilated, no  NVI round and dilated, no NVI   Lens  PC IOL in good position PC IOL in good position   Anterior Vitreous mild syneresis, PVD, vitreous condensations mild syneresis, PVD         Fundus Exam       Right Left   Disc pink and sharp Pink and Sharp   C/D Ratio 0.5 0.3   Macula Good foveal reflex; trace persistent edema SN macula, mild focal laser changes, no heme -- no great targets for focal laser flat, good foveal reflex, RPE mottling and clumping, fine drusen, No heme or edema   Vessels attenuated, mild tortuosity attenuated, Tortuous   Periphery attached, no heme attached, no heme           Refraction     Wearing Rx       Sphere Cylinder Axis Add   Right -1.25 +1.75 113 +2.75   Left -1.50 +1.75 114 +2.75    Type: Bifocal           IMAGING AND PROCEDURES  Imaging and Procedures for  OCT, Retina - OU - Both Eyes       Right Eye Quality was good. Central Foveal Thickness: 243. Progression has been stable. Findings include normal foveal contour, no SRF, retinal drusen , intraretinal fluid, outer retinal atrophy (Persistent IRF/cystic changes SN macula--slightly increased).   Left Eye Quality was good. Central Foveal Thickness: 248. Progression has been stable. Findings include normal foveal contour, no IRF, no SRF, retinal drusen , vitreomacular adhesion (Focal PED/drusen temporal fovea, no fluid; stable release of partial PVD).   Notes *Images captured and stored on drive  Diagnosis / Impression:  OD: BRVO with mild persistent IRF/cystic changes SN macula   OS: NFP, no SRF/IRF, Focal PED/drusen temporal fovea, no fluid, stable release of partial PVD  Clinical management:  See below  Abbreviations: NFP - Normal foveal profile. CME - cystoid macular edema. PED - pigment epithelial detachment. IRF - intraretinal fluid. SRF - subretinal fluid. EZ - ellipsoid zone. ERM - epiretinal membrane. ORA - outer retinal atrophy. ORT - outer retinal tubulation. SRHM - subretinal  hyper-reflective material       Intravitreal Injection, Pharmacologic Agent - OD - Right Eye       Time Out 12/02/2024. 12:45 PM. Confirmed correct patient, procedure, site, and patient consented.   Anesthesia Topical anesthesia was used. Anesthetic medications included Lidocaine  2%, Proparacaine 0.5%.   Procedure Preparation included 5% betadine to ocular surface, eyelid speculum. A supplied (32 g) needle was used.   Injection: 1.25 mg Bevacizumab  1.25mg /0.70ml   Route: Intravitreal, Site: Right Eye   NDC: [id number], Lot: [id number], Expiration date: 12/25/2024   Post-op Post injection exam found visual acuity of at least counting fingers. The patient tolerated the procedure well. There were no complications. The patient received written and verbal post procedure care education. Post injection medications were not given.              ASSESSMENT/PLAN:   ICD-10-CM   1. Branch retinal vein occlusion of right eye with macular edema (HCC)  H34.8310 OCT, Retina - OU - Both Eyes    Intravitreal Injection, Pharmacologic Agent - OD - Right Eye    2. 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 Current use of insulin  (HCC)  Z79.4     4. Long term (current) use of oral hypoglycemic drugs  Z79.84     5. Essential hypertension  I10     6. Hypertensive retinopathy of both eyes  H35.033  7. Posterior vitreous detachment of both eyes  H43.813     8. Pseudophakia of both eyes  Z96.1      1. BRVO with CME OD  - delayed f/u from 9 weeks to 12 weeks (12.30.24 to 03.24.25) - s/p IVA OD #1 (01.19.21), #2 (02.16.21), #3 (03.16.21), #4 (4.13.21), #5 (03.24.25), #6 (05.19.25), #7 (07.07.25), #8 (08.25.25), #9 (10.13.25), ========== - s/p IVE OD #1 (05.11.21 sample), #2 (06.08.21), #3 (07.06.21), #4 (8.3.21), #5 (08.31.21), #6 (09.28.21), #7 (10.26.21), #8 (11.23.21), #9 (12.22.21), #10 (01.24.22), #11 (02.22.22), #12 (03.22.22), #13 (04.19.22), #14 (05.25.22), #15  (06.29.22), #16 (07.27.22), #17 (08.24.22), #18 (09.21.22), #19 (10.19.22), #20 (11.22.22), #21 (12.20.22), #22 (01.18.23), #23 (02.22.23), #24 (03.28.23), #25 (05.09.23), #26 (06.20.23), #27 (08.07.23), #28 (10.02.23), #29 (11.27.23), #30 (01.22.24), #31 (03.18.24), #32 (05.13.24), #33 (07.08.24), #34 (09.03.24), #35 (10.28.24), #36 (12.30.24) - s/p focal laser OD (07.13.22); pt may benefit from repeat focal laser OD, but no great targets  - BCVA OD 20/20 - stably improved s/p CEIOL  - exam with focal edema, IRH, CWS superonasal macula -- improved - OCT shows BRVO with mild persistent IRF/cystic changes SN macula at 9+ weeks  - recommend IVA today OD #10 (12.17.25) w/ f/u back to 7-8 wks  - RBA of procedure discussed, questions answered  - Avastin  informed consent form re-signed and scanned on 03.24.25  - see procedure note - [employer] benefits investigation started, 05.11.21 -- no funding for Good Days - F/U 7-8 wks -- DFE/OCT/possible injection   2-4. Diabetes mellitus, type 2 without retinopathy OU - The incidence, risk factors for progression, natural history and treatment options for diabetic retinopathy  were discussed with patient.   - The need for close monitoring of blood glucose, blood pressure, and serum lipids, avoiding cigarette or any type of tobacco, and the need for long term follow up was also discussed with patient.  - monitor  5,6. Hypertensive retinopathy OU  - discussed importance of tight BP control  - monitor  7. PVD / vitreous syneresis OU  - subacute symptomatic floaters in OS -- no photopsias - OCT shows interval release of partial PVD OS  - Discussed findings and prognosis  - No RT or RD on 360 peripheral exam  - Reviewed s/s of RT/RD  - Strict return precautions for any such RT/RD signs/symptoms  8. Pseudophakia OU  - s/p CE/IOL OU (Dr. Marcey, OD: 06.05.24, OS: 06.25.24)  - IOLs in good position, doing well  - monitor  Ophthalmic Meds Ordered this visit:   No orders of the defined types were placed in this encounter.    Return for 7-8 weeks BRVO OD, DFE, OCT.  There are no Patient Instructions on file for this visit.  This document serves as a record of services personally performed by Redell JUDITHANN Hans, MD, PhD. It was created on their behalf by Almetta Pesa, an ophthalmic technician. The creation of this record is the provider's dictation and/or activities during the visit.    Electronically signed by: Almetta Pesa, OA, 12/02/2024  1:03 PM  This document serves as a record of services personally performed by Redell JUDITHANN Hans, MD, PhD. It was created on their behalf by Wanda GEANNIE Keens, COT an ophthalmic technician. The creation of this record is the provider's dictation and/or activities during the visit.    Electronically signed by:  Wanda GEANNIE Keens, COT  12/02/2024 1:03 PM  Redell JUDITHANN Hans, M.D., Ph.D. Diseases & Surgery of the Retina and Vitreous [facility]    Abbreviations: M myopia (nearsighted); A astigmatism; H hyperopia (farsighted); P presbyopia; Mrx spectacle prescription;  CTL contact lenses; OD right eye; OS left eye; OU both eyes  XT exotropia; ET esotropia; PEK punctate epithelial keratitis; PEE punctate epithelial erosions; DES dry eye syndrome; MGD meibomian gland dysfunction; ATs artificial tears; PFAT's preservative free artificial tears; NSC nuclear sclerotic cataract; PSC posterior subcapsular cataract; ERM epi-retinal membrane; PVD posterior vitreous detachment; RD retinal detachment; DM diabetes mellitus; DR diabetic retinopathy; NPDR non-proliferative diabetic retinopathy; PDR proliferative diabetic retinopathy; CSME clinically significant macular edema; DME diabetic macular edema; dbh dot blot hemorrhages; CWS cotton wool spot; POAG primary open angle glaucoma; C/D cup-to-disc ratio; HVF humphrey visual field; GVF goldmann visual field; OCT optical coherence tomography; IOP  intraocular pressure; BRVO Branch retinal vein occlusion; CRVO central retinal vein occlusion; CRAO central retinal artery occlusion; BRAO branch retinal artery occlusion; RT retinal tear; SB scleral buckle; PPV pars plana vitrectomy; VH Vitreous hemorrhage; PRP panretinal laser photocoagulation; IVK intravitreal kenalog; VMT vitreomacular traction; MH Macular hole;  NVD neovascularization of the disc; NVE neovascularization elsewhere; AREDS age related eye disease study; ARMD age related macular degeneration; POAG primary open angle glaucoma; EBMD epithelial/anterior basement membrane dystrophy; ACIOL anterior chamber intraocular lens; IOL intraocular lens; PCIOL posterior chamber intraocular lens; Phaco/IOL phacoemulsification with intraocular lens placement; PRK photorefractive keratectomy; LASIK laser assisted in situ keratomileusis; HTN hypertension; DM diabetes mellitus; COPD chronic obstructive pulmonary disease

## 2024-12-02 ENCOUNTER — Encounter (INDEPENDENT_AMBULATORY_CARE_PROVIDER_SITE_OTHER): Payer: Self-pay | Admitting: Ophthalmology

## 2024-12-02 ENCOUNTER — Ambulatory Visit (INDEPENDENT_AMBULATORY_CARE_PROVIDER_SITE_OTHER): Admitting: Ophthalmology

## 2024-12-02 DIAGNOSIS — E119 Type 2 diabetes mellitus without complications: Secondary | ICD-10-CM

## 2024-12-02 DIAGNOSIS — I10 Essential (primary) hypertension: Secondary | ICD-10-CM

## 2024-12-02 DIAGNOSIS — Z794 Long term (current) use of insulin: Secondary | ICD-10-CM | POA: Diagnosis not present

## 2024-12-02 DIAGNOSIS — H34831 Tributary (branch) retinal vein occlusion, right eye, with macular edema: Secondary | ICD-10-CM

## 2024-12-02 DIAGNOSIS — Z961 Presence of intraocular lens: Secondary | ICD-10-CM

## 2024-12-02 DIAGNOSIS — H43813 Vitreous degeneration, bilateral: Secondary | ICD-10-CM

## 2024-12-02 DIAGNOSIS — H35033 Hypertensive retinopathy, bilateral: Secondary | ICD-10-CM

## 2024-12-02 DIAGNOSIS — Z7984 Long term (current) use of oral hypoglycemic drugs: Secondary | ICD-10-CM | POA: Diagnosis not present

## 2024-12-02 MED ORDER — BEVACIZUMAB CHEMO INJECTION 1.25MG/0.05ML SYRINGE FOR KALEIDOSCOPE
1.2500 mg | INTRAVITREAL | Status: AC | PRN
  Administered 2024-12-02: 14:00:00 1.25 mg via INTRAVITREAL

## 2025-01-14 NOTE — Progress Notes (Shared)
 " [facility] - Clinic Note  01/25/2025     CHIEF COMPLAINT Patient presents for No chief complaint on file.  HISTORY OF PRESENT ILLNESS: Kurt [name] is a 74 y.o. male who presents to the clinic today for:   Patient states he fell outside and hit the gravel. He is delayed due to weather.   Referring physician:  [facility] [address] [place],  [place] [address]  HISTORICAL INFORMATION:   Selected notes from the MEDICAL RECORD NUMBER Diabetic Eval per Dr. Jacques   CURRENT MEDICATIONS: No current outpatient medications on file. (Ophthalmic Drugs)   No current facility-administered medications for this visit. (Ophthalmic Drugs)   Current Outpatient Medications (Other)  Medication Sig   acetaminophen  (TYLENOL ) 650 MG CR tablet Take 1,300 mg by mouth every 8 (eight) hours as needed for pain.   aspirin  81 MG chewable tablet Chew 1 tablet (81 mg total) by mouth daily.   B-D ULTRAFINE III SHORT PEN 31G X 8 MM MISC Inject into the skin.   docusate sodium  (COLACE) 100 MG capsule Take 100 mg by mouth 2 (two) times daily.   glipiZIDE  (GLUCOTROL  XL) 10 MG 24 hr tablet Take 1 tablet (10 mg total) by mouth daily with breakfast.   HYDROcodone -acetaminophen  (NORCO) 7.5-325 MG tablet Take 1 tablet by mouth daily as needed for moderate pain.   ketoconazole (NIZORAL) 2 % cream Apply 1 Application topically daily.   LANTUS  SOLOSTAR 100 UNIT/ML Solostar Pen Inject 34 Units into the skin at bedtime.   lisinopril  (ZESTRIL ) 20 MG tablet Take 20 mg by mouth daily.   LORazepam  (ATIVAN ) 1 MG tablet Take 1 tablet 30 to 60 minutes before leaving house on day of office surgery.  Bring second tablet with you to office on day of office surgery.   methocarbamol  (ROBAXIN ) 500 MG tablet Take 500 mg by mouth every 8 (eight) hours as needed for muscle spasms.   Multiple Vitamin (MULTIVITAMIN) tablet Take 1 tablet by mouth daily.   Omega-3 Fatty Acids (FISH OIL) 1000  MG CAPS Take by mouth.   ONETOUCH ULTRA test strip 1 each 2 (two) times daily.   simvastatin  (ZOCOR ) 20 MG tablet Take 20 mg by mouth every evening.   SitaGLIPtin -MetFORMIN  HCl [phone] MG TB24 Take 1 tablet by mouth at bedtime.   zinc gluconate 50 MG tablet Take 50 mg by mouth daily.   No current facility-administered medications for this visit. (Other)   REVIEW OF SYSTEMS:     ALLERGIES Allergies  Allergen Reactions   Augmentin [Amoxicillin-Pot Clavulanate] Nausea And Vomiting   Iodine Hives    Pt had IV contrast 10/16/23 without any issue (per Dr Adele)   Tape Rash    Paper    PAST MEDICAL HISTORY Past Medical History:  Diagnosis Date   Arthritis    Bronchitis    Bronchitis    Cataract    OU   Concussion    late 1990's  after a fall   GERD (gastroesophageal reflux disease)    History of kidney stones    Hypertension    Hypertensive retinopathy    OU   Osteogenesis imperfecta    Peripheral vascular disease    PONV (postoperative nausea and vomiting)    pt has post polio syndrome   Post-polio syndrome (HCC)    Type 2 diabetes mellitus (HCC) 12/25/2018   Past Surgical History:  Procedure Laterality Date   ANKLE FRACTURE SURGERY Bilateral  arm surgery Right    nerve surgery   COLONOSCOPY     ELBOW FRACTURE SURGERY Left    EYE MUSCLE SURGERY Left    LEG SURGERY Left    femur fracture with rod   REVERSE SHOULDER ARTHROPLASTY Right 11/10/2019   Procedure: RIGHT REVERSE SHOULDER ARTHROPLASTY;  Surgeon: Addie Cordella Hamilton, MD;  Location[facility] OR;  Service: Orthopedics;  Laterality: Right;   TONSILLECTOMY     FAMILY HISTORY Family History  Problem Relation Age of Onset   Hypertension Mother    Diabetes Brother    SOCIAL HISTORY Social History   Tobacco Use   Smoking status: Never   Smokeless tobacco: Never  Vaping Use   Vaping status: Never Used  Substance Use Topics   Alcohol use: Yes    Comment: 1 beer occasionally   Drug use: No        OPHTHALMIC EXAM: Not recorded    IMAGING AND PROCEDURES  Imaging and Procedures for            ASSESSMENT/PLAN:   ICD-10-CM   1. 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 Current use of insulin  (HCC)  Z79.4     4. Long term (current) use of oral hypoglycemic drugs  Z79.84     5. Essential hypertension  I10     6. Hypertensive retinopathy of both eyes  H35.033     7. Posterior vitreous detachment of both eyes  H43.813     8. Pseudophakia of both eyes  Z96.1       1. BRVO with CME OD  - delayed f/u from 9 weeks to 12 weeks (12.30.24 to 03.24.25) - s/p IVA OD #1 (01.19.21), #2 (02.16.21), #3 (03.16.21), #4 (4.13.21), #5 (03.24.25), #6 (05.19.25), #7 (07.07.25), #8 (08.25.25), #9 (10.13.25), #10 (12.17.25) ========== - s/p IVE OD #1 (05.11.21 sample), #2 (06.08.21), #3 (07.06.21), #4 (8.3.21), #5 (08.31.21), #6 (09.28.21), #7 (10.26.21), #8 (11.23.21), #9 (12.22.21), #10 (01.24.22), #11 (02.22.22), #12 (03.22.22), #13 (04.19.22), #14 (05.25.22), #15 (06.29.22), #16 (07.27.22), #17 (08.24.22), #18 (09.21.22), #19 (10.19.22), #20 (11.22.22), #21 (12.20.22), #22 (01.18.23), #23 (02.22.23), #24 (03.28.23), #25 (05.09.23), #26 (06.20.23), #27 (08.07.23), #28 (10.02.23), #29 (11.27.23), #30 (01.22.24), #31 (03.18.24), #32 (05.13.24), #33 (07.08.24), #34 (09.03.24), #35 (10.28.24), #36 (12.30.24) - s/p focal laser OD (07.13.22); pt may benefit from repeat focal laser OD, but no great targets  - BCVA OD 20/25 from 20/20  - exam with focal edema, IRH, CWS superonasal macula -- improved - OCT shows BRVO with mild persistent IRF/cystic changes SN macula -- slightly increased at 9+ weeks  - recommend IVA today OD #11(02.09.26) w/ f/u back to 7-8 wks  - RBA of procedure discussed, questions answered  - Avastin  informed consent form re-signed and scanned on 03.24.25  - see  procedure note - [employer] benefits investigation started, 05.11.21 -- no funding for Good Days - F/U 7-8 wks -- DFE/OCT/possible injection   2-4. Diabetes mellitus, type 2 without retinopathy OU - The incidence, risk factors for progression, natural history and treatment options for diabetic retinopathy  were discussed with patient.   - The need for close monitoring of blood glucose, blood pressure, and serum lipids, avoiding cigarette or any type of tobacco, and the need for long term follow up was also discussed with patient.  - monitor   5,6. Hypertensive retinopathy OU  - discussed  importance of tight BP control  - monitor  7. PVD / vitreous syneresis OU  - subacute symptomatic floaters in OS -- no photopsias - OCT shows interval release of partial PVD OS  - Discussed findings and prognosis  - No RT or RD on 360 peripheral exam  - Reviewed s/s of RT/RD  - Strict return precautions for any such RT/RD signs/symptoms  8. Pseudophakia OU  - s/p CE/IOL OU (Dr. Marcey, OD: 06.05.24, OS: 06.25.24)  - IOLs in good position, doing well  - monitor  Ophthalmic Meds Ordered this visit:  No orders of the defined types were placed in this encounter.    No follow-ups on file.  There are no Patient Instructions on file for this visit.  This document serves as a record of services personally performed by Redell JUDITHANN Hans, MD, PhD. It was created on their behalf by Paulina Jamse Gay an ophthalmic technician. The creation of this record is the provider's dictation and/or activities during the visit.   Electronically signed by: Paulina JONETTA Gay  01/14/25  2:23 PM   Redell JUDITHANN Hans, M.D., Ph.D. Diseases & Surgery of the Retina and Vitreous [facility]   Abbreviations: M myopia (nearsighted); A astigmatism; H hyperopia (farsighted); P presbyopia; Mrx spectacle prescription;  CTL contact lenses; OD right eye; OS left eye; OU both eyes  XT exotropia; ET esotropia; PEK  punctate epithelial keratitis; PEE punctate epithelial erosions; DES dry eye syndrome; MGD meibomian gland dysfunction; ATs artificial tears; PFAT's preservative free artificial tears; NSC nuclear sclerotic cataract; PSC posterior subcapsular cataract; ERM epi-retinal membrane; PVD posterior vitreous detachment; RD retinal detachment; DM diabetes mellitus; DR diabetic retinopathy; NPDR non-proliferative diabetic retinopathy; PDR proliferative diabetic retinopathy; CSME clinically significant macular edema; DME diabetic macular edema; dbh dot blot hemorrhages; CWS cotton wool spot; POAG primary open angle glaucoma; C/D cup-to-disc ratio; HVF humphrey visual field; GVF goldmann visual field; OCT optical coherence tomography; IOP intraocular pressure; BRVO Branch retinal vein occlusion; CRVO central retinal vein occlusion; CRAO central retinal artery occlusion; BRAO branch retinal artery occlusion; RT retinal tear; SB scleral buckle; PPV pars plana vitrectomy; VH Vitreous hemorrhage; PRP panretinal laser photocoagulation; IVK intravitreal kenalog; VMT vitreomacular traction; MH Macular hole;  NVD neovascularization of the disc; NVE neovascularization elsewhere; AREDS age related eye disease study; ARMD age related macular degeneration; POAG primary open angle glaucoma; EBMD epithelial/anterior basement membrane dystrophy; ACIOL anterior chamber intraocular lens; IOL intraocular lens; PCIOL posterior chamber intraocular lens; Phaco/IOL phacoemulsification with intraocular lens placement; PRK photorefractive keratectomy; LASIK laser assisted in situ keratomileusis; HTN hypertension; DM diabetes mellitus; COPD chronic obstructive pulmonary disease "

## 2025-01-25 ENCOUNTER — Encounter (INDEPENDENT_AMBULATORY_CARE_PROVIDER_SITE_OTHER): Admitting: Ophthalmology

## 2025-01-25 DIAGNOSIS — H34831 Tributary (branch) retinal vein occlusion, right eye, with macular edema: Secondary | ICD-10-CM

## 2025-01-25 DIAGNOSIS — Z7984 Long term (current) use of oral hypoglycemic drugs: Secondary | ICD-10-CM

## 2025-01-25 DIAGNOSIS — I10 Essential (primary) hypertension: Secondary | ICD-10-CM

## 2025-01-25 DIAGNOSIS — H35033 Hypertensive retinopathy, bilateral: Secondary | ICD-10-CM

## 2025-01-25 DIAGNOSIS — E119 Type 2 diabetes mellitus without complications: Secondary | ICD-10-CM

## 2025-01-25 DIAGNOSIS — Z794 Long term (current) use of insulin: Secondary | ICD-10-CM

## 2025-01-25 DIAGNOSIS — H43813 Vitreous degeneration, bilateral: Secondary | ICD-10-CM

## 2025-01-25 DIAGNOSIS — Z961 Presence of intraocular lens: Secondary | ICD-10-CM
